# Patient Record
Sex: Female | Born: 1955
Health system: Southern US, Community
[De-identification: ages and names within clinical notes are randomized; demographics above are authoritative.]

## PROBLEM LIST (undated history)

## (undated) ENCOUNTER — Ambulatory Visit (HOSPITAL_COMMUNITY): Admission: EM | Payer: Medicare Other

## (undated) DIAGNOSIS — M1990 Unspecified osteoarthritis, unspecified site: Secondary | ICD-10-CM

## (undated) DIAGNOSIS — R569 Unspecified convulsions: Secondary | ICD-10-CM

## (undated) DIAGNOSIS — I10 Essential (primary) hypertension: Secondary | ICD-10-CM

## (undated) DIAGNOSIS — E119 Type 2 diabetes mellitus without complications: Secondary | ICD-10-CM

---

## 1997-05-25 ENCOUNTER — Emergency Department (HOSPITAL_COMMUNITY): Admission: EM | Admit: 1997-05-25 | Discharge: 1997-05-25 | Payer: Self-pay | Admitting: Emergency Medicine

## 1997-08-19 ENCOUNTER — Emergency Department (HOSPITAL_COMMUNITY): Admission: EM | Admit: 1997-08-19 | Discharge: 1997-08-19 | Payer: Self-pay | Admitting: Emergency Medicine

## 1997-08-23 ENCOUNTER — Emergency Department (HOSPITAL_COMMUNITY): Admission: EM | Admit: 1997-08-23 | Discharge: 1997-08-23 | Payer: Self-pay | Admitting: Emergency Medicine

## 1997-12-27 ENCOUNTER — Encounter: Payer: Self-pay | Admitting: Internal Medicine

## 1997-12-27 ENCOUNTER — Ambulatory Visit (HOSPITAL_COMMUNITY): Admission: RE | Admit: 1997-12-27 | Discharge: 1997-12-27 | Payer: Self-pay | Admitting: Internal Medicine

## 1998-01-30 ENCOUNTER — Encounter: Admission: RE | Admit: 1998-01-30 | Discharge: 1998-01-30 | Payer: Self-pay | Admitting: Obstetrics

## 1998-05-07 ENCOUNTER — Emergency Department (HOSPITAL_COMMUNITY): Admission: EM | Admit: 1998-05-07 | Discharge: 1998-05-07 | Payer: Self-pay | Admitting: Emergency Medicine

## 1998-09-19 ENCOUNTER — Ambulatory Visit (HOSPITAL_COMMUNITY): Admission: RE | Admit: 1998-09-19 | Discharge: 1998-09-19 | Payer: Self-pay | Admitting: *Deleted

## 1998-12-20 ENCOUNTER — Emergency Department (HOSPITAL_COMMUNITY): Admission: EM | Admit: 1998-12-20 | Discharge: 1998-12-20 | Payer: Self-pay | Admitting: Emergency Medicine

## 1999-07-03 ENCOUNTER — Ambulatory Visit (HOSPITAL_COMMUNITY): Admission: RE | Admit: 1999-07-03 | Discharge: 1999-07-03 | Payer: Self-pay | Admitting: Internal Medicine

## 1999-07-03 ENCOUNTER — Encounter: Payer: Self-pay | Admitting: Internal Medicine

## 1999-07-09 ENCOUNTER — Ambulatory Visit (HOSPITAL_COMMUNITY): Admission: RE | Admit: 1999-07-09 | Discharge: 1999-07-09 | Payer: Self-pay | Admitting: *Deleted

## 1999-07-17 ENCOUNTER — Encounter: Admission: RE | Admit: 1999-07-17 | Discharge: 1999-07-17 | Payer: Self-pay | Admitting: Internal Medicine

## 1999-07-17 ENCOUNTER — Encounter: Payer: Self-pay | Admitting: Internal Medicine

## 2000-09-27 ENCOUNTER — Encounter: Payer: Self-pay | Admitting: Emergency Medicine

## 2000-09-27 ENCOUNTER — Emergency Department (HOSPITAL_COMMUNITY): Admission: EM | Admit: 2000-09-27 | Discharge: 2000-09-27 | Payer: Self-pay | Admitting: Emergency Medicine

## 2005-02-03 ENCOUNTER — Inpatient Hospital Stay (HOSPITAL_COMMUNITY): Admission: EM | Admit: 2005-02-03 | Discharge: 2005-02-07 | Payer: Self-pay | Admitting: Emergency Medicine

## 2005-02-18 ENCOUNTER — Ambulatory Visit: Payer: Self-pay | Admitting: *Deleted

## 2005-02-18 ENCOUNTER — Ambulatory Visit: Payer: Self-pay | Admitting: Nurse Practitioner

## 2005-03-08 ENCOUNTER — Ambulatory Visit: Payer: Self-pay | Admitting: Nurse Practitioner

## 2005-09-07 ENCOUNTER — Ambulatory Visit: Payer: Self-pay | Admitting: Nurse Practitioner

## 2005-09-21 ENCOUNTER — Ambulatory Visit: Payer: Self-pay | Admitting: Nurse Practitioner

## 2005-11-22 ENCOUNTER — Emergency Department (HOSPITAL_COMMUNITY): Admission: EM | Admit: 2005-11-22 | Discharge: 2005-11-22 | Payer: Self-pay | Admitting: Emergency Medicine

## 2005-11-27 ENCOUNTER — Emergency Department (HOSPITAL_COMMUNITY): Admission: EM | Admit: 2005-11-27 | Discharge: 2005-11-27 | Payer: Self-pay | Admitting: Emergency Medicine

## 2005-12-20 ENCOUNTER — Ambulatory Visit: Payer: Self-pay | Admitting: Nurse Practitioner

## 2006-01-20 ENCOUNTER — Ambulatory Visit: Payer: Self-pay | Admitting: Nurse Practitioner

## 2006-03-01 ENCOUNTER — Emergency Department (HOSPITAL_COMMUNITY): Admission: EM | Admit: 2006-03-01 | Discharge: 2006-03-02 | Payer: Self-pay | Admitting: Emergency Medicine

## 2006-03-01 ENCOUNTER — Emergency Department (HOSPITAL_COMMUNITY): Admission: EM | Admit: 2006-03-01 | Discharge: 2006-03-01 | Payer: Self-pay | Admitting: Emergency Medicine

## 2006-03-07 ENCOUNTER — Ambulatory Visit: Payer: Self-pay | Admitting: Nurse Practitioner

## 2006-03-16 ENCOUNTER — Ambulatory Visit: Payer: Self-pay | Admitting: Nurse Practitioner

## 2006-05-02 ENCOUNTER — Ambulatory Visit (HOSPITAL_COMMUNITY): Admission: RE | Admit: 2006-05-02 | Discharge: 2006-05-02 | Payer: Self-pay | Admitting: Nurse Practitioner

## 2006-05-02 ENCOUNTER — Ambulatory Visit: Payer: Self-pay | Admitting: Nurse Practitioner

## 2006-08-30 ENCOUNTER — Emergency Department (HOSPITAL_COMMUNITY): Admission: EM | Admit: 2006-08-30 | Discharge: 2006-08-30 | Payer: Self-pay | Admitting: Emergency Medicine

## 2006-09-28 ENCOUNTER — Encounter (INDEPENDENT_AMBULATORY_CARE_PROVIDER_SITE_OTHER): Payer: Self-pay | Admitting: *Deleted

## 2006-10-19 ENCOUNTER — Emergency Department (HOSPITAL_COMMUNITY): Admission: EM | Admit: 2006-10-19 | Discharge: 2006-10-19 | Payer: Self-pay | Admitting: Emergency Medicine

## 2006-10-28 ENCOUNTER — Emergency Department (HOSPITAL_COMMUNITY): Admission: EM | Admit: 2006-10-28 | Discharge: 2006-10-28 | Payer: Self-pay | Admitting: Emergency Medicine

## 2007-03-22 ENCOUNTER — Ambulatory Visit: Payer: Self-pay | Admitting: Internal Medicine

## 2007-03-24 ENCOUNTER — Ambulatory Visit: Payer: Self-pay | Admitting: Internal Medicine

## 2007-08-08 ENCOUNTER — Ambulatory Visit: Payer: Self-pay | Admitting: Internal Medicine

## 2007-08-24 ENCOUNTER — Ambulatory Visit: Payer: Self-pay | Admitting: Internal Medicine

## 2009-10-24 ENCOUNTER — Other Ambulatory Visit: Admission: RE | Admit: 2009-10-24 | Discharge: 2009-10-24 | Payer: Self-pay | Admitting: Obstetrics and Gynecology

## 2009-11-06 ENCOUNTER — Encounter: Admission: RE | Admit: 2009-11-06 | Discharge: 2009-11-06 | Payer: Self-pay | Admitting: Obstetrics and Gynecology

## 2009-11-20 ENCOUNTER — Encounter: Admission: RE | Admit: 2009-11-20 | Discharge: 2009-11-20 | Payer: Self-pay | Admitting: Obstetrics and Gynecology

## 2010-05-29 ENCOUNTER — Ambulatory Visit
Admission: RE | Admit: 2010-05-29 | Discharge: 2010-05-29 | Disposition: A | Discharge status: Home / Self Care | Payer: No Typology Code available for payment source | Source: Ambulatory Visit | Attending: Infectious Diseases | Admitting: Infectious Diseases

## 2010-05-29 ENCOUNTER — Other Ambulatory Visit (HOSPITAL_BASED_OUTPATIENT_CLINIC_OR_DEPARTMENT_OTHER): Payer: Self-pay | Admitting: Internal Medicine

## 2010-05-29 ENCOUNTER — Other Ambulatory Visit: Payer: Self-pay | Admitting: Infectious Diseases

## 2010-05-29 DIAGNOSIS — R7611 Nonspecific reaction to tuberculin skin test without active tuberculosis: Secondary | ICD-10-CM

## 2010-05-29 NOTE — Discharge Summary (Signed)
NAMEMarland Kitchen  Christine Rocha, Christine Rocha                 ACCOUNT NO.:  [id number]   MEDICAL RECORD NO.:  [id number]          PATIENT TYPE:  INP   LOCATION:  5702                         FACILITY:  [facility]   PHYSICIAN:  Mark C. Ophelia Charter, M.D.    DATE OF BIRTH:  02/10/1955   DATE OF ADMISSION:  02/03/2005  DATE OF DISCHARGE:  02/07/2005                                 DISCHARGE SUMMARY   FINAL DIAGNOSIS:  On-the-job injury with right third finger infection, with  cellulitis and dorsal abscess.   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  Subsequently two days later she had  progressive swelling and drainage despite soaking it.  She presented with  massive cellulitis, draining purulence over the dorsum of the hand with  abscess, which was drained in the emergency room.  She has been on Dilantin  in the past, was not taking it on admission.   HOSPITAL COURSE:  The patient was admitted after drainage of the abscess.  A  stat Gram stain was performed showing gram-positive cocci.  Hemoglobin was  6.8.  She was seen by medical service, transfused.  She was placed on  contact isolation, IV vancomycin.  Her diagnosis was likely iron-deficiency  anemia secondary to heavy menses.  She continued on vancomycin and culture  results were obtained.  White count dropped from 10.3 down to 4000-5000.  Glucose was normal.  She had typical left shift.  Blood culture results were  performed, which showed no growth.  Wound culture showed abundant  methicillin-resistant staph sensitive to tetracycline and vancomycin as  expected and resistant to oxacillin.  The finger got significant  improvement.  She was having daily whirlpool soaks or pulse lavage with  dressing change with the therapist and was ready for discharge with home  dressing changes by the 28th.  The patient was given Dilantin as an  oral  load, since she had not been on it, by the medical service.  She was  discharged on doxycycline.  Office follow-up one week.  Work slip given for  no work x1 week.      Mark C. Ophelia Charter, M.D.  Electronically Signed     MCY/MEDQ  D:  03/31/2005  T:  04/01/2005  Job:  [id number]

## 2010-05-29 NOTE — Consult Note (Signed)
NAMEMarland Rocha  MADALEE, ALTMANN                 ACCOUNT NO.:  [id number]   MEDICAL RECORD NO.:  [id number]          PATIENT TYPE:  INP   LOCATION:  1823                         FACILITY:  [facility]   PHYSICIAN:  Lonia Blood, M.D.       DATE OF BIRTH:  [birth year]   DATE OF CONSULTATION:  02/03/2005  DATE OF DISCHARGE:                                   CONSULTATION   REQUESTING PHYSICIAN:  Dr. Ophelia Charter.   REASON FOR CONSULTATION:  Anemia.   HISTORY OF PRESENT ILLNESS:  Christine Rocha is a 55 year old African-American  woman with a history of chronic blood loss anemia from heavy menses, who  came in to the [facility] Emergency Room with a swollen finger.  She was  diagnosed with cellulitis and abscess, and Dr. Ophelia Charter is going to admit the  patient for treatment.  On the routine blood work, she was discovered to be  severely anemic and we were called for a consultation.  The patient recalls  being told about two years ago that she may have anemia, but she never did  something about it.  She does not carry any history of sickle cell disease  or hemolysis that she knows of.  As a child, she did not have anemia.   PAST MEDICAL HISTORY:  Seizure disorder, for which Christine Rocha has been  taking Dilantin for a long time, but she quit taking about a month ago.  Her  last seizure activity was one month ago.  Also, she was told at times that  she has hypertension, but never took any medication for it.   SOCIAL HISTORY:  Christine Rocha is separated from her husband.  She does not  have any children.  She lives alone.  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  She smokes about two packs of  cigarettes a day.  She does not drink alcohol.   FAMILY HISTORY:  Both parents are deceased.  The patient's mother died at  age 45 in a car accident and the patient's father died at age 58 with a  myocardial infarction.   REVIEW OF SYSTEMS:  Positive for heavy menses, positive for occasional chest  pain, positive for decreased appetite, vomiting and nausea, and positive for  some cough.  Other systems as per HPI.  All other systems are negative.   PHYSICAL EXAMINATION:  VITAL SIGNS:  Upon admission shows a temperature of  100, blood pressure 137/81, heart rate 122, respiratory rate 16 and  saturation 100% on room air.  GENERAL:  The patient appears in no acute distress, alert and oriented to  place, person and time, able to provide a full and complete history.  HEENT:  Her head is normocephalic, atraumatic.  Pupils equal, round,  reactive to light and accommodation.  Extraocular movements are intact.  Sclerae are anicteric.  Conjunctivae are pale.  She has got missing teeth.  Throat is clear.  NECK:  Supple without JVD and no carotid bruits.  CHEST:  Clear to auscultation bilaterally with some rhonchi very faint, but  no wheezes and no crackles.  HEART:  Regular rate and rhythm without murmurs, rubs or gallops.  ABDOMEN:  Soft, nontender and bowel sounds are present.  EXTREMITIES:  The right upper extremity third finger has edema and  tenderness and patient has decreased range of motion on that finger.  Lower  extremities have no edema and good pulses bilaterally.  NEUROLOGICAL EXAMINATION:  Cranial nerves 2 to 12 are intact.  Strength 5/5  in all four extremities.  Sensation appears to be intact.   LABORATORY VALUES:  On admission:  Sodium 136, potassium 3.4, chloride 106,  BUN 11, creatinine 1, glucose 100, white blood cell count 10,000, hemoglobin  7.6, platelet count 368, MCV 62, urinalysis positive for leukocyte esterase  and 3 to 6 white blood cells.  Portable chest x-ray shows no acute disease  and EKG some flat T-waves in 2, 3, aVF and negative T-wave in V3.  On plain  x-ray of the finger shows diffuse soft tissue edema, but no apparent  osteomyelitis.   IMPRESSION AND RECOMMENDATIONS:  1.  Cellulitis, the third finger.  We agree with intravenous Vancomycin       empirically as Dr. Ophelia Charter ordered and the patient will probably need I&D      per ortho service.  2.  Anemia, microcytic in nature most likely secondary to iron deficiency      from chronic blood loss secondary to menses.  We will need also to rule      out GI loss.  Plan is to obtain an iron panel, ferritin, heme check all      stools, start the patient on iron therapy, and type and cross unit to      hold.  3.  History of seizures.  We will resume the patient's Dilantin and      recommend that she stays compliant with it.  4.  Tobacco abuse.  The patient needs counseling for smoking cessation and      we will start a nicotine patch.  Thank you for the consultation.  We      will follow along with you.      Lonia Blood, M.D.  Electronically Signed     SL/MEDQ  D:  02/03/2005  T:  02/03/2005  Job:  [id number]

## 2010-10-22 LAB — COMPREHENSIVE METABOLIC PANEL
AST: 20
BUN: 7
CO2: 21
Chloride: 111
Creatinine, Ser: 0.88
GFR calc Af Amer: >60
GFR calc non Af Amer: >60
Total Bilirubin: 0.5

## 2010-10-22 LAB — POCT I-STAT CREATININE: Creatinine, Ser: 0.9

## 2010-10-22 LAB — RAPID URINE DRUG SCREEN, HOSP PERFORMED
Amphetamines: NOT DETECTED
Barbiturates: NOT DETECTED
Benzodiazepines: NOT DETECTED
Cocaine: NOT DETECTED
Opiates: NOT DETECTED

## 2010-10-22 LAB — I-STAT 8, (EC8 V) (CONVERTED LAB)
Acid-base deficit: 5 — ABNORMAL HIGH
Chloride: 111
pCO2, Ven: 36 — ABNORMAL LOW
pH, Ven: 7.356 — ABNORMAL HIGH

## 2011-01-21 ENCOUNTER — Emergency Department (HOSPITAL_COMMUNITY)
Admission: EM | Admit: 2011-01-21 | Discharge: 2011-01-21 | Disposition: A | Discharge status: Home / Self Care | Payer: Self-pay | Attending: Emergency Medicine | Admitting: Emergency Medicine

## 2011-01-21 ENCOUNTER — Encounter (HOSPITAL_COMMUNITY): Payer: Self-pay | Admitting: Emergency Medicine

## 2011-01-21 DIAGNOSIS — K047 Periapical abscess without sinus: Secondary | ICD-10-CM | POA: Insufficient documentation

## 2011-01-21 DIAGNOSIS — R6884 Jaw pain: Secondary | ICD-10-CM | POA: Insufficient documentation

## 2011-01-21 DIAGNOSIS — R599 Enlarged lymph nodes, unspecified: Secondary | ICD-10-CM | POA: Insufficient documentation

## 2011-01-21 DIAGNOSIS — R220 Localized swelling, mass and lump, head: Secondary | ICD-10-CM | POA: Insufficient documentation

## 2011-01-21 DIAGNOSIS — G40909 Epilepsy, unspecified, not intractable, without status epilepticus: Secondary | ICD-10-CM | POA: Insufficient documentation

## 2011-01-21 DIAGNOSIS — K089 Disorder of teeth and supporting structures, unspecified: Secondary | ICD-10-CM | POA: Insufficient documentation

## 2011-01-21 HISTORY — DX: Unspecified convulsions: R56.9

## 2011-01-21 MED ORDER — HYDROCODONE-ACETAMINOPHEN 5-325 MG PO TABS: 1.0000 | ORAL_TABLET | Freq: Four times a day (QID) | ORAL | Status: AC | PRN

## 2011-01-21 MED ORDER — NAPROXEN 500 MG PO TABS: 500.0000 mg | ORAL_TABLET | Freq: Two times a day (BID) | ORAL | Status: DC

## 2011-01-21 MED ORDER — CLINDAMYCIN HCL 150 MG PO CAPS: 150.0000 mg | ORAL_CAPSULE | Freq: Four times a day (QID) | ORAL | Status: AC

## 2011-01-21 NOTE — ED Notes (Signed)
Left lower tooth pain x 3 nights

## 2011-01-21 NOTE — ED Notes (Signed)
Discharge instructions reviewed with pt.  Verbalizes understanding.  No questions asked; No further c/o's voiced.  Ambulatory to lobby.

## 2011-01-21 NOTE — ED Provider Notes (Signed)
History     CSN: [id number]  Arrival date & time 01/21/11  1253   First MD Initiated Contact with Patient 01/21/11 1350      Chief Complaint  Patient presents with  . Dental Pain    (Consider location/radiation/quality/duration/timing/severity/associated sxs/prior treatment) Patient is a 56 y.o. female presenting with tooth pain. The history is provided by the patient.  Dental PainThe primary symptoms include mouth pain. Primary symptoms do not include dental injury, oral bleeding, headaches, fever, shortness of breath, sore throat or angioedema. The symptoms began 2 days ago. The symptoms are worsening. The symptoms occur constantly.  Additional symptoms include: jaw pain and facial swelling. Additional symptoms do not include: purulent gums and trismus. Medical issues include: smoking.   The pain is 10 out of 10 it is nonradiating associated with swelling of the left jaw. Past Medical History  Diagnosis Date  . Seizures     History reviewed. No pertinent past surgical history.  No family history on file.  History  Substance Use Topics  . Smoking status: Current Everyday Smoker  . Smokeless tobacco: Not on file  . Alcohol Use: Yes    OB History    Grav Para Term Preterm Abortions TAB SAB Ect Mult Living                  Review of Systems  Constitutional: Negative for fever.  HENT: Positive for facial swelling. Negative for congestion and sore throat.   Eyes: Negative for visual disturbance.  Respiratory: Negative for shortness of breath.   Cardiovascular: Negative for chest pain.  Gastrointestinal: Negative for abdominal pain.  Genitourinary: Negative for dysuria.  Musculoskeletal: Negative for back pain.  Neurological: Negative for headaches.  Hematological: Does not bruise/bleed easily.    Allergies  Review of patient's allergies indicates no known allergies.  Home Medications   Current Outpatient Rx  Name Route Sig Dispense Refill  . CLINDAMYCIN HCL  150 MG PO CAPS Oral Take 1 capsule (150 mg total) by mouth every 6 (six) hours. 28 capsule 0  . CLINDAMYCIN HCL 150 MG PO CAPS Oral Take 1 capsule (150 mg total) by mouth every 6 (six) hours. 28 capsule 0  . HYDROCODONE-ACETAMINOPHEN 5-325 MG PO TABS Oral Take 1-2 tablets by mouth every 6 (six) hours as needed for pain. 14 tablet 0    BP 164/94  Pulse 80  Temp 98.4 F (36.9 C)  Resp 18  SpO2 99%  Physical Exam  Nursing note and vitals reviewed. Constitutional: She is oriented to person, place, and time. She appears well-developed and well-nourished.  HENT:  Head: Normocephalic and atraumatic.  Mouth/Throat: Oropharynx is clear and moist.       Swelling to left lower jaw area tender second molar on the left side lower jaw no swelling under the tongue some submandibular adenopathy.  Eyes: Conjunctivae are normal. Pupils are equal, round, and reactive to light.  Neck: Normal range of motion. Neck supple.  Cardiovascular: Normal rate, regular rhythm and normal heart sounds.   No murmur heard. Pulmonary/Chest: Effort normal and breath sounds normal.  Abdominal: Soft. Bowel sounds are normal. There is no tenderness.  Musculoskeletal: Normal range of motion.  Lymphadenopathy:    She has cervical adenopathy.  Neurological: She is alert and oriented to person, place, and time. No cranial nerve deficit. She exhibits normal muscle tone. Coordination normal.  Skin: Skin is warm. No rash noted.    ED Course  Procedures (including critical care time)  Labs Reviewed -  No data to display No results found.   1. Tooth abscess       MDM    Findings consistent with left lower jaw second molar tooth abscess and some cheek swelling no swelling under the tongue. Patient has a dentist to followup with.        Shelda Jakes, MD 01/21/11 [phone]

## 2011-03-22 ENCOUNTER — Encounter (HOSPITAL_COMMUNITY): Payer: Self-pay | Admitting: *Deleted

## 2011-03-22 ENCOUNTER — Emergency Department (HOSPITAL_COMMUNITY)
Admission: EM | Admit: 2011-03-22 | Discharge: 2011-03-22 | Disposition: A | Discharge status: Home / Self Care | Payer: Medicaid Other | Attending: Emergency Medicine | Admitting: Emergency Medicine

## 2011-03-22 DIAGNOSIS — Z880 Allergy status to penicillin: Secondary | ICD-10-CM | POA: Insufficient documentation

## 2011-03-22 DIAGNOSIS — K047 Periapical abscess without sinus: Secondary | ICD-10-CM | POA: Insufficient documentation

## 2011-03-22 DIAGNOSIS — F17200 Nicotine dependence, unspecified, uncomplicated: Secondary | ICD-10-CM | POA: Insufficient documentation

## 2011-03-22 DIAGNOSIS — I10 Essential (primary) hypertension: Secondary | ICD-10-CM | POA: Insufficient documentation

## 2011-03-22 HISTORY — DX: Essential (primary) hypertension: I10

## 2011-03-22 MED ORDER — HYDROCODONE-ACETAMINOPHEN 5-325 MG PO TABS: 1.0000 | ORAL_TABLET | ORAL | Status: AC | PRN

## 2011-03-22 MED ORDER — CLINDAMYCIN HCL 150 MG PO CAPS: 450.0000 mg | ORAL_CAPSULE | Freq: Three times a day (TID) | ORAL | Status: AC

## 2011-03-22 NOTE — Discharge Instructions (Signed)
Dental Assistance If the dentist on-call cannot see you, please use the resources below:   Patients with Medicaid: [facility] [phone] [address], [id number] [address] [phone]  If unable to pay, or uninsured, contact [facility] [phone]) or [facility] Department [phone] in [place], [phone] in [facility]) to become qualified for the adult dental clinic  Other Low-Cost Community Dental Services: [facility]- [address] Natasha Bence [place], [place], [address]    [phone], Ext. [phone]    2nd and 4th Thursday of the month at 6:30am    10 clients each day by appointment, can sometimes see walk-in     patients if someone does not show for an appointment [facility]- [address] Ether Griffins [place], [place], [address]    [phone] [facility] [address] [place], [place], [address]    [phone]  [facility] Health Department- [phone] [facility] Health Department- [phone] [facility] [phone]         Dental Abscess A dental abscess usually starts from an infected tooth. Antibiotic medicine and pain pills can be helpful, but dental infections require the attention of a dentist. Rinse around the infected area often with salt water (a pinch of salt in 8 oz of warm water). Do not apply heat to the outside of your face. See your dentist or oral surgeon as soon as possible.  SEEK IMMEDIATE MEDICAL CARE IF:  You have increasing, severe pain that is not relieved by medicine.   You or your child has an oral temperature above 102 F (38.9 C), not controlled by medicine.   Your baby is older than 3 months with a rectal temperature of 102 F (38.9 C) or higher.   Your baby is 72 months old or younger with a rectal temperature of 100.4 F (38 C) or higher.   You develop chills, severe headache, difficulty breathing, or trouble swallowing.   You have swelling in the neck or around  the eye.  Document Released: 12/28/2004 Document Revised: 12/17/2010 Document Reviewed: 06/08/2006 [facility] Patient Information 2012 [place], [place].           Smoking Cessation This document explains the best ways for you to quit smoking and new treatments to help. It lists new medicines that can double or triple your chances of quitting and quitting for good. It also considers ways to avoid relapses and concerns you may have about quitting, including weight gain. NICOTINE: A POWERFUL ADDICTION If you have tried to quit smoking, you know how hard it can be. It is hard because nicotine is a very addictive drug. For some people, it can be as addictive as heroin or cocaine. Usually, people make 2 or 3 tries, or more, before finally being able to quit. Each time you try to quit, you can learn about what helps and what hurts. Quitting takes hard work and a lot of effort, but you can quit smoking. QUITTING SMOKING IS ONE OF THE MOST IMPORTANT THINGS YOU WILL EVER DO.  You will live longer, feel better, and live better.   The impact on your body of quitting smoking is felt almost immediately:   Within 20 minutes, blood pressure decreases. Pulse returns to its normal level.   After 8 hours, carbon monoxide levels in the blood return to normal. Oxygen level increases.   After 24 hours, chance of heart attack starts to decrease. Breath, hair, and body stop smelling like smoke.   After  48 hours, damaged nerve endings begin to recover. Sense of taste and smell improve.   After 72 hours, the body is virtually free of nicotine. Bronchial tubes relax and breathing becomes easier.   After 2 to 12 weeks, lungs can hold more air. Exercise becomes easier and circulation improves.   Quitting will reduce your risk of having a heart attack, stroke, cancer, or lung disease:   After 1 year, the risk of coronary heart disease is cut in half.   After 5 years, the risk of stroke falls to the same as a  nonsmoker.   After 10 years, the risk of lung cancer is cut in half and the risk of other cancers decreases significantly.   After 15 years, the risk of coronary heart disease drops, usually to the level of a nonsmoker.   If you are pregnant, quitting smoking will improve your chances of having a healthy baby.   The people you live with, especially your children, will be healthier.   You will have extra money to spend on things other than cigarettes.  FIVE KEYS TO QUITTING Studies have shown that these 5 steps will help you quit smoking and quit for good. You have the best chances of quitting if you use them together: 1. Get ready.  2. Get support and encouragement.  3. Learn new skills and behaviors.  4. Get medicine to reduce your nicotine addiction and use it correctly.  5. Be prepared for relapse or difficult situations. Be determined to continue trying to quit, even if you do not succeed at first.  1. GET READY  Set a quit date.   Change your environment.   Get rid of ALL cigarettes, ashtrays, matches, and lighters in your home, car, and place of work.   Do not let people smoke in your home.   Review your past attempts to quit. Think about what worked and what did not.   Once you quit, do not smoke. NOT EVEN A PUFF!  2. GET SUPPORT AND ENCOURAGEMENT Studies have shown that you have a better chance of being successful if you have help. You can get support in many ways.  Tell your family, friends, and coworkers that you are going to quit and need their support. Ask them not to smoke around you.   Talk to your caregivers (doctor, dentist, nurse, pharmacist, psychologist, and/or smoking counselor).   Get individual, group, or telephone counseling and support. The more counseling you have, the better your chances are of quitting. Programs are available at [employer] and health centers. Call your local health department for information about programs in your area.    Spiritual beliefs and practices may help some smokers quit.   Quit meters are Photographer that keep track of quit statistics, such as amount of "quit-time," cigarettes not smoked, and money saved.   Many smokers find one or more of the many self-help books available useful in helping them quit and stay off tobacco.  3. LEARN NEW SKILLS AND BEHAVIORS  Try to distract yourself from urges to smoke. Talk to someone, go for a walk, or occupy your time with a task.   When you first try to quit, change your routine. Take a different route to work. Drink tea instead of coffee. Eat breakfast in a different place.   Do something to reduce your stress. Take a hot bath, exercise, or read a book.   Plan something enjoyable to do every day. Reward  yourself for not smoking.   Explore interactive web-based programs that specialize in helping you quit.  4. GET MEDICINE AND USE IT CORRECTLY Medicines can help you stop smoking and decrease the urge to smoke. Combining medicine with the above behavioral methods and support can quadruple your chances of successfully quitting smoking. The U.S. Food and Drug Administration (FDA) has approved 7 medicines to help you quit smoking. These medicines fall into 3 categories.  Nicotine replacement therapy (delivers nicotine to your body without the negative effects and risks of smoking):   Nicotine gum: Available over-the-counter.   Nicotine lozenges: Available over-the-counter.   Nicotine inhaler: Available by prescription.   Nicotine nasal spray: Available by prescription.   Nicotine skin patches (transdermal): Available by prescription and over-the-counter.   Antidepressant medicine (helps people abstain from smoking, but how this works is unknown):   Bupropion sustained-release (SR) tablets: Available by prescription.   Nicotinic receptor partial agonist (simulates the effect of nicotine in your brain):   Varenicline  tartrate tablets: Available by prescription.   Ask your caregiver for advice about which medicines to use and how to use them. Carefully read the information on the package.   Everyone who is trying to quit may benefit from using a medicine. If you are pregnant or trying to become pregnant, nursing an infant, you are under age 28, or you smoke fewer than 10 cigarettes per day, talk to your caregiver before taking any nicotine replacement medicines.   You should stop using a nicotine replacement product and call your caregiver if you experience nausea, dizziness, weakness, vomiting, fast or irregular heartbeat, mouth problems with the lozenge or gum, or redness or swelling of the skin around the patch that does not go away.   Do not use any other product containing nicotine while using a nicotine replacement product.   Talk to your caregiver before using these products if you have diabetes, heart disease, asthma, stomach ulcers, you had a recent heart attack, you have high blood pressure that is not controlled with medicine, a history of irregular heartbeat, or you have been prescribed medicine to help you quit smoking.  5. BE PREPARED FOR RELAPSE OR DIFFICULT SITUATIONS  Most relapses occur within the first 3 months after quitting. Do not be discouraged if you start smoking again. Remember, most people try several times before they finally quit.   You may have symptoms of withdrawal because your body is used to nicotine. You may crave cigarettes, be irritable, feel very hungry, cough often, get headaches, or have difficulty concentrating.   The withdrawal symptoms are only temporary. They are strongest when you first quit, but they will go away within 10 to 14 days.  Here are some difficult situations to watch for:  Alcohol. Avoid drinking alcohol. Drinking lowers your chances of successfully quitting.   Caffeine. Try to reduce the amount of caffeine you consume. It also lowers your chances of  successfully quitting.   Other smokers. Being around smoking can make you want to smoke. Avoid smokers.   Weight gain. Many smokers will gain weight when they quit, usually less than 10 pounds. Eat a healthy diet and stay active. Do not let weight gain distract you from your main goal, quitting smoking. Some medicines that help you quit smoking may also help delay weight gain. You can always lose the weight gained after you quit.   Bad mood or depression. There are a lot of ways to improve your mood other than smoking.  If  you are having problems with any of these situations, talk to your caregiver. SPECIAL SITUATIONS AND CONDITIONS Studies suggest that everyone can quit smoking. Your situation or condition can give you a special reason to quit.  Pregnant women/new mothers: By quitting, you protect your baby's health and your own.   Hospitalized patients: By quitting, you reduce health problems and help healing.   Heart attack patients: By quitting, you reduce your risk of a second heart attack.   Lung, head, and neck cancer patients: By quitting, you reduce your chance of a second cancer.   Parents of children and adolescents: By quitting, you protect your children from illnesses caused by secondhand smoke.  QUESTIONS TO THINK ABOUT Think about the following questions before you try to stop smoking. You may want to talk about your answers with your caregiver.  Why do you want to quit?   If you tried to quit in the past, what helped and what did not?   What will be the most difficult situations for you after you quit? How will you plan to handle them?   Who can help you through the tough times? Your family? Friends? Caregiver?   What pleasures do you get from smoking? What ways can you still get pleasure if you quit?  Here are some questions to ask your caregiver:  How can you help me to be successful at quitting?   What medicine do you think would be best for me and how should I  take it?   What should I do if I need more help?   What is smoking withdrawal like? How can I get information on withdrawal?  Quitting takes hard work and a lot of effort, but you can quit smoking. FOR MORE INFORMATION  Smokefree.gov (http://www.davis-sullivan.com/) provides free, accurate, evidence-based information and professional assistance to help support the immediate and long-term needs of people trying to quit smoking. Document Released: 12/22/2000 Document Revised: 12/17/2010 Document Reviewed: 10/14/2008 [facility] Patient Information 2012 [place], [place].

## 2011-03-22 NOTE — ED Notes (Signed)
Presents c/o left lower dental and jaw pain. Presents with facial swelling and difficulty chewing. Denies sob or trouble swallowing.

## 2011-03-22 NOTE — ED Provider Notes (Signed)
History     CSN: [id number]  Arrival date & time 03/22/11  4098   First MD Initiated Contact with Patient 03/22/11 [phone]      Chief Complaint  Patient presents with  . Dental Pain  . Facial Swelling    (Consider location/radiation/quality/duration/timing/severity/associated sxs/prior treatment) Patient is a 56 y.o. female presenting with tooth pain. The history is provided by the patient.  Dental PainThe primary symptoms include mouth pain. Primary symptoms do not include headaches, fever, shortness of breath or sore throat. The symptoms began 2 days ago. The symptoms are worsening. The symptoms are recurrent. The symptoms occur constantly.  Affected locations include: gum(s) and teeth.  Additional symptoms include: gum swelling, gum tenderness, jaw pain and facial swelling. Additional symptoms do not include: purulent gums, trismus, trouble swallowing and ear pain. Medical issues include: smoking.  No fever or chills. Pt reports a hx of dental abscess, tx with abx approx 2 months ago. She has not yet followed up with a dentist as her medical card has not yet come in.  Past Medical History  Diagnosis Date  . Seizures   . Hypertension     No past surgical history on file.  No family history on file.  History  Substance Use Topics  . Smoking status: Current Everyday Smoker  . Smokeless tobacco: Not on file  . Alcohol Use: Yes     Review of Systems  Constitutional: Negative for fever and chills.  HENT: Positive for facial swelling and dental problem. Negative for ear pain, sore throat, trouble swallowing, neck pain, neck stiffness and voice change.   Eyes: Negative for pain and visual disturbance.  Respiratory: Negative for shortness of breath.   Gastrointestinal: Negative for nausea and vomiting.  Neurological: Negative for headaches.    Allergies  Penicillins  Home Medications  No current outpatient prescriptions on file.  BP 155/91  Pulse 86  Temp(Src) 98.1 F  (36.7 C) (Oral)  Resp 15  SpO2 95%  Physical Exam  Nursing note and vitals reviewed. Constitutional: She is oriented to person, place, and time. She appears well-developed and well-nourished.       NAD  HENT:  Head: Normocephalic and atraumatic. No trismus in the jaw.  Right Ear: External ear normal.  Left Ear: External ear normal.  Nose: Nose normal.  Mouth/Throat: Mucous membranes are normal. Dental abscesses present. No uvula swelling. No oropharyngeal exudate, posterior oropharyngeal edema, posterior oropharyngeal erythema or tonsillar abscesses.       Full upper, partial lower dentures in place. Caries to native teeth. Swelling and tenderness without purulence to gingiva at base of bottom left second molar with tooth pain on percussion.   Eyes: Conjunctivae are normal. Pupils are equal, round, and reactive to light.  Neck: Normal range of motion. Neck supple.       Mild left submandibular LAD without neck swelling or tenderness.  Cardiovascular: Normal rate and regular rhythm.   Pulmonary/Chest: No respiratory distress.  Musculoskeletal: She exhibits no edema.  Neurological: She is alert and oriented to person, place, and time. No cranial nerve deficit.  Skin: Skin is warm and dry.  Psychiatric: She has a normal mood and affect.    ED Course  Procedures (including critical care time)  Labs Reviewed - No data to display No results found.   Dx 1 : Dental abscess   MDM  Dental abscess. No area visible amenable to ED drainage. NO s/s of ludwig angina. Pt allergic to PCN, will place on clinda  and give rx for pain. Discussed with pt risks of not obtaining appropriate dental follow-up. She will call to find out the status of her dental card and if she will not be able to follow-up with her dentist in the next week, will obtain appropriate follow-up with resources I will provide on her d/c paperwork. Also discussed the role of smoking on dental health and will provide smoking  cessation information.        Shaaron Adler, [place] 03/22/11 [phone]

## 2011-03-22 NOTE — ED Provider Notes (Signed)
Medical screening examination/treatment/procedure(s) were performed by non-physician practitioner and as supervising physician I was immediately available for consultation/collaboration.   Dominique Ressel, MD 03/22/11 1532 

## 2012-03-26 ENCOUNTER — Encounter (HOSPITAL_COMMUNITY): Payer: Self-pay | Admitting: Family Medicine

## 2012-03-26 ENCOUNTER — Emergency Department (HOSPITAL_COMMUNITY): Payer: Worker's Compensation

## 2012-03-26 ENCOUNTER — Emergency Department (HOSPITAL_COMMUNITY)
Admission: EM | Admit: 2012-03-26 | Discharge: 2012-03-26 | Disposition: A | Discharge status: Home / Self Care | Payer: Worker's Compensation | Attending: Emergency Medicine | Admitting: Emergency Medicine

## 2012-03-26 DIAGNOSIS — Y9229 Other specified public building as the place of occurrence of the external cause: Secondary | ICD-10-CM | POA: Insufficient documentation

## 2012-03-26 DIAGNOSIS — Y990 Civilian activity done for income or pay: Secondary | ICD-10-CM | POA: Insufficient documentation

## 2012-03-26 DIAGNOSIS — F17200 Nicotine dependence, unspecified, uncomplicated: Secondary | ICD-10-CM | POA: Insufficient documentation

## 2012-03-26 DIAGNOSIS — Y9389 Activity, other specified: Secondary | ICD-10-CM | POA: Insufficient documentation

## 2012-03-26 DIAGNOSIS — S6000XA Contusion of unspecified finger without damage to nail, initial encounter: Secondary | ICD-10-CM

## 2012-03-26 DIAGNOSIS — I10 Essential (primary) hypertension: Secondary | ICD-10-CM | POA: Insufficient documentation

## 2012-03-26 DIAGNOSIS — W230XXA Caught, crushed, jammed, or pinched between moving objects, initial encounter: Secondary | ICD-10-CM | POA: Insufficient documentation

## 2012-03-26 DIAGNOSIS — G40909 Epilepsy, unspecified, not intractable, without status epilepticus: Secondary | ICD-10-CM | POA: Insufficient documentation

## 2012-03-26 MED ORDER — TRAMADOL HCL 50 MG PO TABS: 50.0000 mg | ORAL_TABLET | Freq: Four times a day (QID) | ORAL | Status: DC | PRN

## 2012-03-26 NOTE — ED Notes (Signed)
Buddy taped middle and ring finger on patient's rt. hand.

## 2012-03-26 NOTE — ED Notes (Signed)
Pt is in xray

## 2012-03-26 NOTE — ED Provider Notes (Signed)
Medical screening examination/treatment/procedure(s) were performed by non-physician practitioner and as supervising physician I was immediately available for consultation/collaboration.  Penney Domanski L Amelda Hapke, MD 03/26/12 1659 

## 2012-03-26 NOTE — ED Provider Notes (Signed)
History     CSN: [id number]  Arrival date & time 03/26/12  1000   First MD Initiated Contact with Patient 03/26/12 1129      Chief Complaint  Patient presents with  . Hand Pain    (Consider location/radiation/quality/duration/timing/severity/associated sxs/prior treatment) HPI  Patient presents to the emergency department with complaints of middle finger pain on her right hand. She works at a [employer] and axillae closed her finger into one of the sliding doors. She says now it is swollen and hurts to move. She denies any other injury. The injury happened on Friday. She has delayed presentation because she just found out from her work that it would be a workers comp case. She denies being under the influence of any alcohol drugs. nad vss  Past Medical History  Diagnosis Date  . Seizures   . Hypertension     History reviewed. No pertinent past surgical history.  History reviewed. No pertinent family history.  History  Substance Use Topics  . Smoking status: Current Every Day Smoker  . Smokeless tobacco: Not on file  . Alcohol Use: Yes    OB History   Grav Para Term Preterm Abortions TAB SAB Ect Mult Living                  Review of Systems  All other systems reviewed and are negative.    Allergies  Penicillins  Home Medications   Current Outpatient Rx  Name  Route  Sig  Dispense  Refill  . traMADol (ULTRAM) 50 MG tablet   Oral   Take 1 tablet (50 mg total) by mouth every 6 (six) hours as needed for pain.   15 tablet   0     BP 160/88  Pulse 73  Temp(Src) 97.3 F (36.3 C)  Resp 18  SpO2 97%  Physical Exam  Nursing note and vitals reviewed. Constitutional: She appears well-developed and well-nourished. No distress.  HENT:  Head: Normocephalic and atraumatic.  Eyes: Pupils are equal, round, and reactive to light.  Neck: Normal range of motion. Neck supple.  Cardiovascular: Normal rate and regular rhythm.   Pulmonary/Chest: Effort  normal.  Abdominal: Soft.  Musculoskeletal:       Hands: Patient is able to flex and extend all joints to right hand including right middle finger. She has decreased range of motion because of pain. Her finger is swollen with signs of ecchymosis. No drainable hematoma under the nail. No crepitus or deformity noted. She has full sedation and capillary refill is less than 2 seconds.  Neurological: She is alert.  Skin: Skin is warm and dry.    ED Course  Procedures (including critical care time)  Labs Reviewed - No data to display Dg Hand Complete Right  03/26/2012  *RADIOLOGY REPORT*  Clinical Data: Hand slammed in door.  Pain and swelling.  RIGHT HAND - COMPLETE 3+ VIEW  Comparison: None.  Findings: No evidence of fracture or dislocation.  Early degenerative spurring is seen at the distal interphalangeal joint of index finger, however there is no evidence of joint space narrowing.  The no other significant bone abnormality identified.  IMPRESSION:  1.  No acute findings. 2.  Early degenerative spurring of the index finger DIP joint.   Original Report Authenticated By: Myles Rosenthal, M.D.      1. Finger contusion, initial encounter       MDM  X-rays are negative for any abnormality from the injury.  Fingers buddy tapped together  in ER for comfort. Tramadol Rx given and referral to hand.  Pt has been advised of the symptoms that warrant their return to the ED. Patient has voiced understanding and has agreed to follow-up with the PCP or specialist.         Dorthula Matas, PA-C 03/26/12 1211

## 2012-03-26 NOTE — ED Notes (Signed)
Per pt sts closed her hand in the door on Friday and now swelling and cant move it.

## 2012-08-31 ENCOUNTER — Emergency Department (HOSPITAL_COMMUNITY)
Admission: EM | Admit: 2012-08-31 | Discharge: 2012-08-31 | Disposition: A | Discharge status: Home / Self Care | Payer: Medicaid Other | Attending: Emergency Medicine | Admitting: Emergency Medicine

## 2012-08-31 ENCOUNTER — Encounter (HOSPITAL_COMMUNITY): Payer: Self-pay | Admitting: *Deleted

## 2012-08-31 DIAGNOSIS — I10 Essential (primary) hypertension: Secondary | ICD-10-CM | POA: Insufficient documentation

## 2012-08-31 DIAGNOSIS — R220 Localized swelling, mass and lump, head: Secondary | ICD-10-CM | POA: Insufficient documentation

## 2012-08-31 DIAGNOSIS — R1310 Dysphagia, unspecified: Secondary | ICD-10-CM | POA: Insufficient documentation

## 2012-08-31 DIAGNOSIS — Y929 Unspecified place or not applicable: Secondary | ICD-10-CM | POA: Insufficient documentation

## 2012-08-31 DIAGNOSIS — F17200 Nicotine dependence, unspecified, uncomplicated: Secondary | ICD-10-CM | POA: Insufficient documentation

## 2012-08-31 DIAGNOSIS — IMO0002 Reserved for concepts with insufficient information to code with codable children: Secondary | ICD-10-CM | POA: Insufficient documentation

## 2012-08-31 DIAGNOSIS — M25562 Pain in left knee: Secondary | ICD-10-CM

## 2012-08-31 DIAGNOSIS — Y9389 Activity, other specified: Secondary | ICD-10-CM | POA: Insufficient documentation

## 2012-08-31 DIAGNOSIS — S8990XA Unspecified injury of unspecified lower leg, initial encounter: Secondary | ICD-10-CM | POA: Insufficient documentation

## 2012-08-31 DIAGNOSIS — Y990 Civilian activity done for income or pay: Secondary | ICD-10-CM | POA: Insufficient documentation

## 2012-08-31 NOTE — ED Notes (Signed)
Pt reports bumping her left lower leg on something at work yesterday, reports pain and swelling, no redness noted to leg, pt ambulatory at triage.reports also having a lump or mass to right side of neck for several days, airway intact.

## 2012-08-31 NOTE — ED Provider Notes (Signed)
CSN: [id number]     Arrival date & time 08/31/12  1724 History  This chart was scribed for non-physician practitioner Sharilyn Sites working with Raeford Razor, MD by Danella Maiers, ED Scribe. This patient was seen in room TR07C/TR07C and the patient's care was started at 6:26 PM.    Chief Complaint  Patient presents with  . Leg Pain    The history is provided by the patient. No language interpreter was used.   HPI Comments: Christine Rocha is a 57 y.o. female who presents to the Emergency Department complaining of constant, sudden-onset left knee pain with associated knee swelling after bumping her leg on an iron pole at work yesterday. Pain worse with prolonged standing and ambulation-- which pt does the entire length of her work shift.  Denies any numbness or paresthesias of LE.  No prior left knee injury.  Has taken OTC aleve with some improvement of sx.  She also complains of a  "lump" on the right side of her neck that appeared several days ago.  States now she feels it in the middle of her neck.  No hx of similar.  No recent illness.  No fevers, sweats, or chills.  No hx of thyroid problems.  No difficulty swallowing or talking.  Past Medical History  Diagnosis Date  . Seizures   . Hypertension    History reviewed. No pertinent past surgical history. History reviewed. No pertinent family history. History  Substance Use Topics  . Smoking status: Current Every Day Smoker  . Smokeless tobacco: Not on file  . Alcohol Use: Yes   OB History   Grav Para Term Preterm Abortions TAB SAB Ect Mult Living                 Review of Systems  HENT: Positive for trouble swallowing.   Musculoskeletal: Positive for arthralgias. Myalgias: left knee pain.  Skin:       "lump" on right side of neck  All other systems reviewed and are negative.    Allergies  Penicillins  Home Medications   Current Outpatient Rx  Name  Route  Sig  Dispense  Refill  . ibuprofen (ADVIL,MOTRIN) 200 MG  tablet   Oral   Take 200 mg by mouth every 6 (six) hours as needed for pain.          BP 172/98  Pulse 88  Temp(Src) 97.9 F (36.6 C) (Oral)  Resp 18  SpO2 94%  Physical Exam  Nursing note and vitals reviewed. Constitutional: She is oriented to person, place, and time. She appears well-developed and well-nourished. No distress.  HENT:  Head: Normocephalic and atraumatic.  Mouth/Throat: Oropharynx is clear and moist.  Eyes: Conjunctivae and EOM are normal. Pupils are equal, round, and reactive to light.  Neck: Trachea normal, normal range of motion and full passive range of motion without pain. Neck supple. No rigidity.  No masses on right side of neck, thyroid slightly enlarged; handling secretions appropriately, speaking in full complete sentences without difficulty  Cardiovascular: Normal rate, regular rhythm and normal heart sounds.   Pulmonary/Chest: Effort normal and breath sounds normal.  Musculoskeletal: Normal range of motion. She exhibits edema (1+ pitting edema bilaterally).       Left knee: She exhibits normal range of motion, no swelling, no effusion, no ecchymosis, no deformity and no erythema. Tenderness found. Medial joint line tenderness noted.  Left knee with tenderness of medial joint line. No deformity or swelling. Full ROM maintained. Distal sensation  intact. Normal gait  Lymphadenopathy:    She has no cervical adenopathy.  Neurological: She is alert and oriented to person, place, and time.  Skin: Skin is warm and dry. She is not diaphoretic.  Psychiatric: She has a normal mood and affect.    ED Course  Medications - No data to display  DIAGNOSTIC STUDIES: Oxygen Saturation is 94% on room air, adequate by my interpretation.    COORDINATION OF CARE: 7:05 PM- Discussed treatment plan with pt which includes a knee brace and pt agrees to plan. Advised pt to follow up with the outpatient clinic for further eval of thyroid Procedures (including critical care  time)  Labs Reviewed - No data to display No results found.  1. Knee pain, left     MDM   Doubt acute fx of tib/fib-- imaging deferred.  Knee sleeve placed for comfort.  Instructed to continue taking over-the-counter anti-inflammatories to help with pain and swelling. No masses felt on right side of neck, but thyroid feels somewhat enlarged.  Encouraged close FU with [facility] to have thyroid evaluated further.  Discussed plan with pt, they agreed.  Return precautions advised.  I personally performed the services described in this documentation, which was scribed in my presence. The recorded information has been reviewed and is accurate.    Garlon Hatchet, PA-C 08/31/12 2327

## 2012-08-31 NOTE — ED Notes (Signed)
Pt given ice to apply to knee.

## 2012-09-01 NOTE — ED Provider Notes (Signed)
Medical screening examination/treatment/procedure(s) were performed by non-physician practitioner and as supervising physician I was immediately available for consultation/collaboration.  Naitik Hermann, MD 09/01/12 1535 

## 2012-09-04 ENCOUNTER — Ambulatory Visit: Payer: PRIVATE HEALTH INSURANCE | Attending: Family Medicine | Admitting: Internal Medicine

## 2012-09-04 VITALS — BP 180/118 | HR 91 | Temp 97.0°F | Resp 16 | Wt 189.0 lb

## 2012-09-04 DIAGNOSIS — M25569 Pain in unspecified knee: Secondary | ICD-10-CM | POA: Insufficient documentation

## 2012-09-04 DIAGNOSIS — I10 Essential (primary) hypertension: Secondary | ICD-10-CM

## 2012-09-04 DIAGNOSIS — R220 Localized swelling, mass and lump, head: Secondary | ICD-10-CM

## 2012-09-04 MED ORDER — NICOTINE 14 MG/24HR TD PT24: 1.0000 | MEDICATED_PATCH | TRANSDERMAL | Status: DC

## 2012-09-04 MED ORDER — ATENOLOL 50 MG PO TABS: 50.0000 mg | ORAL_TABLET | Freq: Every day | ORAL | Status: DC

## 2012-09-04 NOTE — Progress Notes (Signed)
Patient ID: Christine Rocha, female   DOB: 11-Oct-1955, 57 y.o.   MRN: [id number]  CC:  HPI: 57 year old female who is here to establish care. The patient was in the ED on 8/21 for left knee pain. She has been taking ibuprofen 2 or 3 times a day.[name] No x-rays were done. She also complained of lump on the right side of her neck which has decreased in size over the last couple of days. She had some associated difficulty with swallowing. She denies any sore throat She denies any fever, chills, cough She smokes one pack a day She is hypertensive today her systolic blood pressure close to 180. Patient states that she was on antihypertensive medication but quit taking this 3 years ago     Allergies  Allergen Reactions  . Penicillins    Past Medical History  Diagnosis Date  . Seizures   . Hypertension    Current Outpatient Prescriptions on File Prior to Visit  Medication Sig Dispense Refill  . ibuprofen (ADVIL,MOTRIN) 200 MG tablet Take 200 mg by mouth every 6 (six) hours as needed for pain.       No current facility-administered medications on file prior to visit.   History reviewed. No pertinent family history. History   Social History  . Marital Status: Single    Spouse Name: N/A    Number of Children: N/A  . Years of Education: N/A   Occupational History  . Not on file.   Social History Main Topics  . Smoking status: Current Every Day Smoker  . Smokeless tobacco: Not on file  . Alcohol Use: Yes  . Drug Use:   . Sexual Activity:    Other Topics Concern  . Not on file   Social History Narrative  . No narrative on file    Review of Systems  Constitutional: Negative for fever, chills, diaphoresis, activity change, appetite change and fatigue.  HENT: Negative for ear pain, nosebleeds, congestion, facial swelling, rhinorrhea, neck pain, neck stiffness and ear discharge.   Eyes: Negative for pain, discharge, redness, itching and visual disturbance.  Respiratory: Negative for  cough, choking, chest tightness, shortness of breath, wheezing and stridor.   Cardiovascular: Negative for chest pain, palpitations and leg swelling.  Gastrointestinal: Negative for abdominal distention.  Genitourinary: Negative for dysuria, urgency, frequency, hematuria, flank pain, decreased urine volume, difficulty urinating and dyspareunia.  Musculoskeletal: Negative for back pain, joint swelling, arthralgias and gait problem.  Neurological: Negative for dizziness, tremors, seizures, syncope, facial asymmetry, speech difficulty, weakness, light-headedness, numbness and headaches.  Hematological: Negative for adenopathy. Does not bruise/bleed easily.  Psychiatric/Behavioral: Negative for hallucinations, behavioral problems, confusion, dysphoric mood, decreased concentration and agitation.    Objective:   Filed Vitals:   09/04/12 1557  BP: 180/118  Pulse: 91  Temp: 97 F (36.1 C)  Resp: 16    Physical Exam  Constitutional: Appears well-developed and well-nourished. No distress.  HENT: Normocephalic. External right and left ear normal. Oropharynx is clear and moist.  Eyes: Conjunctivae and EOM are normal. PERRLA, no scleral icterus.  Neck: Normal ROM. Neck supple. No JVD. No tracheal deviation. No thyromegaly.  CVS: RRR, S1/S2 +, no murmurs, no gallops, no carotid bruit.  Pulmonary: Effort and breath sounds normal, no stridor, rhonchi, wheezes, rales.  Abdominal: Soft. BS +,  no distension, tenderness, rebound or guarding.  Musculoskeletal: Normal range of motion. No edema and no tenderness.  Lymphadenopathy: No lymphadenopathy noted, cervical, inguinal. Neuro: Alert. Normal reflexes, muscle tone coordination. No cranial  nerve deficit. Skin: Skin is warm and dry. No rash noted. Not diaphoretic. No erythema. No pallor.  Psychiatric: Normal mood and affect. Behavior, judgment, thought content normal.   Lab Results  Component Value Date   HGB 10.2* 10/19/2006   HCT 30.0* 10/19/2006    Lab Results  Component Value Date   CREATININE 0.9 10/19/2006   BUN 7 10/19/2006   NA 144 10/19/2006   K 3.8 10/19/2006   CL 111 10/19/2006   CO2 21 10/19/2006    No results found for this basename: HGBA1C   Lipid Panel  No results found for this basename: chol, trig, hdl, cholhdl, vldl, ldlcalc       Assessment and plan:   There are no active problems to display for this patient.  Establish care Obtain baseline labs including TSH, CBC, CMP, vitamin D., lipid panel Mammogram, last mammogram was in 2011 that showed fibrocystic breast disease   Swelling head/neck Obtain ultrasound of the neck to rule out any thyroid mass or swelling  Left knee pain patient advised to alternate ibuprofen with Tylenol, because of her high blood pressure    Hypertension We'll start the patient on atenolol, 50 mg a day  Nicotine dependence patient motivated to quit smoking, have prescribed her a nicotine patch  Followup in one week      The patient was given clear instructions to go to ER or return to medical center if symptoms don't improve, worsen or new problems develop. The patient verbalized understanding. The patient was told to call to get any lab results if not heard anything in the next week.

## 2012-09-04 NOTE — Progress Notes (Signed)
Patient complains of swelling in neck States it hurts for her to swallow

## 2012-09-05 ENCOUNTER — Ambulatory Visit: Payer: Self-pay

## 2012-09-05 LAB — COMPREHENSIVE METABOLIC PANEL
ALT: 15 U/L (ref 0–35)
AST: 15 U/L (ref 0–37)
Albumin: 4.2 g/dL (ref 3.5–5.2)
Alkaline Phosphatase: 60 U/L (ref 39–117)
Calcium: 9.6 mg/dL (ref 8.4–10.5)
Chloride: 104 mEq/L (ref 96–112)
Potassium: 3.3 mEq/L — ABNORMAL LOW (ref 3.5–5.3)
Sodium: 144 mEq/L (ref 135–145)
Total Protein: 6.9 g/dL (ref 6.0–8.3)

## 2012-09-05 LAB — LIPID PANEL
Cholesterol: 169 mg/dL (ref 0–200)
LDL Cholesterol: 65 mg/dL (ref 0–99)
Triglycerides: 391 mg/dL — ABNORMAL HIGH (ref <150)
VLDL: 78 mg/dL — ABNORMAL HIGH (ref 0–40)

## 2012-09-05 LAB — CBC WITH DIFFERENTIAL/PLATELET
Basophils Absolute: 0 10*3/uL (ref 0.0–0.1)
Basophils Relative: 0 % (ref 0–1)
Eosinophils Absolute: 0.2 10*3/uL (ref 0.0–0.7)
Hemoglobin: 13.1 g/dL (ref 12.0–15.0)
MCH: 29.6 pg (ref 26.0–34.0)
MCHC: 33.9 g/dL (ref 30.0–36.0)
Monocytes Relative: 6 % (ref 3–12)
Neutro Abs: 4.1 10*3/uL (ref 1.7–7.7)
Neutrophils Relative %: 62 % (ref 43–77)
RDW: 13.9 % (ref 11.5–15.5)

## 2012-09-05 LAB — TSH: TSH: 1.447 u[IU]/mL (ref 0.350–4.500)

## 2012-09-13 ENCOUNTER — Ambulatory Visit: Payer: No Typology Code available for payment source | Attending: Internal Medicine | Admitting: Internal Medicine

## 2012-09-13 ENCOUNTER — Encounter: Payer: Self-pay | Admitting: Internal Medicine

## 2012-09-13 VITALS — BP 175/96 | HR 84 | Temp 98.7°F | Resp 18 | Ht 62.0 in | Wt 193.0 lb

## 2012-09-13 DIAGNOSIS — I10 Essential (primary) hypertension: Secondary | ICD-10-CM

## 2012-09-13 DIAGNOSIS — Z23 Encounter for immunization: Secondary | ICD-10-CM

## 2012-09-13 DIAGNOSIS — R7309 Other abnormal glucose: Secondary | ICD-10-CM | POA: Insufficient documentation

## 2012-09-13 LAB — POCT GLYCOSYLATED HEMOGLOBIN (HGB A1C): Hemoglobin A1C: 6.3

## 2012-09-13 MED ORDER — FENOFIBRATE 145 MG PO TABS: 145.0000 mg | ORAL_TABLET | Freq: Every day | ORAL | Status: DC

## 2012-09-13 MED ORDER — POTASSIUM CHLORIDE CRYS ER 20 MEQ PO TBCR: 40.0000 meq | EXTENDED_RELEASE_TABLET | Freq: Once | ORAL | Status: DC

## 2012-09-13 MED ORDER — AMLODIPINE BESYLATE 10 MG PO TABS: 10.0000 mg | ORAL_TABLET | Freq: Every day | ORAL | Status: DC

## 2012-09-13 MED ORDER — POTASSIUM CHLORIDE CRYS ER 20 MEQ PO TBCR: 20.0000 meq | EXTENDED_RELEASE_TABLET | Freq: Once | ORAL | Status: DC

## 2012-09-13 NOTE — Progress Notes (Signed)
Pt here for blood pressure f/u with new medication prescribed. BP 175/96 84 Nicotine patch not working for sensation

## 2012-09-13 NOTE — Progress Notes (Signed)
Patient ID: Christine Rocha, female   DOB: 1955-05-17, 57 y.o.   MRN: [id number] Patient Demographics  Christine Rocha, is a 57 y.o. female  NFA:[id number]  ION:[id number]  DOB - 1955/05/01  Chief Complaint  Patient presents with  . Follow-up    blood pressure with new medication        Subjective:   Christine Rocha with History of hypertension her for followup visit, she also continues to smoke, have no other subjective complaints.  Denies any subjective complaints except as above, no active headache, no chest abdominal pain at this time, not short of breath. No focal weakness which is new.   Objective:    There are no active problems to display for this patient.    Filed Vitals:   09/13/12 1746  BP: 175/96  Pulse: 84  Temp: 98.7 F (37.1 C)  TempSrc: Oral  Resp: 18  Height: 5\' 2"  (1.575 m)  Weight: 193 lb (87.544 kg)  SpO2: 98%     Exam   Awake Alert, Oriented X 3, No new F.N deficits, Normal affect [name].AT,PERRAL Supple Neck,No JVD, No cervical lymphadenopathy appriciated.  Symmetrical Chest wall movement, Good air movement bilaterally, CTAB RRR,No Gallops,Rubs or new Murmurs, No Parasternal Heave +ve B.Sounds, Abd Soft, Non tender, No organomegaly appriciated, No rebound - guarding or rigidity. No Cyanosis, Clubbing or edema, No new Rash or bruise       Data Review   CBC No results found for this basename: WBC, HGB, HCT, PLT, MCV, MCH, MCHC, RDW, NEUTRABS, LYMPHSABS, MONOABS, EOSABS, BASOSABS, BANDABS, BANDSABD,  in the last 168 hours  Chemistries   No results found for this basename: NA, K, CL, CO2, GLUCOSE, BUN, CREATININE, GFRCGP, CALCIUM, MG, AST, ALT, ALKPHOS, BILITOT,  in the last 168 hours ------------------------------------------------------------------------------------------------------------------ No results found for this basename: HGBA1C,  in the last 72  hours ------------------------------------------------------------------------------------------------------------------ No results found for this basename: CHOL, HDL, LDLCALC, TRIG, CHOLHDL, LDLDIRECT,  in the last 72 hours ------------------------------------------------------------------------------------------------------------------ No results found for this basename: TSH, T4TOTAL, FREET3, T3FREE, THYROIDAB,  in the last 72 hours ------------------------------------------------------------------------------------------------------------------ No results found for this basename: VITAMINB12, FOLATE, FERRITIN, TIBC, IRON, RETICCTPCT,  in the last 72 hours  Coagulation profile  No results found for this basename: INR, PROTIME,  in the last 168 hours     Prior to Admission medications   Medication Sig Start Date End Date Taking? Authorizing Provider  atenolol (TENORMIN) 50 MG tablet Take 1 tablet (50 mg total) by mouth daily. 09/04/12  Yes Richarda Overlie, MD  ibuprofen (ADVIL,MOTRIN) 200 MG tablet Take 200 mg by mouth every 6 (six) hours as needed for pain.   Yes Historical Provider, MD  nicotine (NICODERM CQ - DOSED IN MG/24 HOURS) 14 mg/24hr patch Place 1 patch onto the skin daily. 09/04/12  Yes Richarda Overlie, MD  amLODipine (NORVASC) 10 MG tablet Take 1 tablet (10 mg total) by mouth daily. 09/13/12   Leroy Sea, MD     Assessment & Plan    1. Hypertension poor controlled Norvasc added to her regimen we'll see her back in 2 weeks.   2. History of smoking counseled to quit smoking, continues in the NicoDerm patch.   3.High TGs - tricor, repeat lipids in 6 weeks.    Baseline A1c has been ordered,  On routine blood work last time potassium was low it will be replaced with 40 meq.  Colonoscopy - referral made    Mammogram, Pap smear - referral made  Immunizations flu shot       Leroy Sea M.D on 09/13/2012 at 6:17 PM

## 2012-09-18 ENCOUNTER — Encounter: Payer: Self-pay | Admitting: Obstetrics and Gynecology

## 2012-09-18 NOTE — Progress Notes (Signed)
Quick Note:  She has prediabetes, please have the patient come back in the clinic for a week to discuss results ______

## 2012-09-19 ENCOUNTER — Telehealth: Payer: Self-pay | Admitting: Emergency Medicine

## 2012-09-19 NOTE — Telephone Encounter (Signed)
Pt has f/u appt already scheduled 10/04/12. Will discuss lab results during visit

## 2012-09-28 ENCOUNTER — Ambulatory Visit: Payer: Self-pay | Admitting: Internal Medicine

## 2012-10-04 ENCOUNTER — Ambulatory Visit: Payer: Self-pay | Admitting: Internal Medicine

## 2012-10-11 ENCOUNTER — Ambulatory Visit: Payer: Medicaid Other | Attending: Internal Medicine | Admitting: Family Medicine

## 2012-10-11 ENCOUNTER — Encounter: Payer: Self-pay | Admitting: Family Medicine

## 2012-10-11 DIAGNOSIS — I10 Essential (primary) hypertension: Secondary | ICD-10-CM | POA: Insufficient documentation

## 2012-10-11 DIAGNOSIS — F17200 Nicotine dependence, unspecified, uncomplicated: Secondary | ICD-10-CM | POA: Insufficient documentation

## 2012-10-11 MED ORDER — POTASSIUM CHLORIDE ER 10 MEQ PO TBCR: 10.0000 meq | EXTENDED_RELEASE_TABLET | Freq: Every day | ORAL | Status: DC

## 2012-10-11 MED ORDER — ATENOLOL-CHLORTHALIDONE 100-25 MG PO TABS: 1.0000 | ORAL_TABLET | Freq: Every day | ORAL | Status: DC

## 2012-10-11 MED ORDER — BUPROPION HCL ER (SMOKING DET) 150 MG PO TB12: 150.0000 mg | ORAL_TABLET | Freq: Two times a day (BID) | ORAL | Status: DC

## 2012-10-11 NOTE — Progress Notes (Signed)
Pt is here requesting different smoking cessation medication. States the nicotine patch is not working.  Started smoking again

## 2012-10-11 NOTE — Patient Instructions (Signed)
Smoking Cessation Quitting smoking is important to your health and has many advantages. However, it is not always easy to quit since nicotine is a very addictive drug. Often times, people try 3 times or more before being able to quit. This document explains the best ways for you to prepare to quit smoking. Quitting takes hard work and a lot of effort, but you can do it. ADVANTAGES OF QUITTING SMOKING  You will live longer, feel better, and live better.  Your body will feel the impact of quitting smoking almost immediately.  Within 20 minutes, blood pressure decreases. Your pulse returns to its normal level.  After 8 hours, carbon monoxide levels in the blood return to normal. Your oxygen level increases.  After 24 hours, the chance of having a heart attack starts to decrease. Your breath, hair, and body stop smelling like smoke.  After 48 hours, damaged nerve endings begin to recover. Your sense of taste and smell improve.  After 72 hours, the body is virtually free of nicotine. Your bronchial tubes relax and breathing becomes easier.  After 2 to 12 weeks, lungs can hold more air. Exercise becomes easier and circulation improves.  The risk of having a heart attack, stroke, cancer, or lung disease is greatly reduced.  After 1 year, the risk of coronary heart disease is cut in half.  After 5 years, the risk of stroke falls to the same as a nonsmoker.  After 10 years, the risk of lung cancer is cut in half and the risk of other cancers decreases significantly.  After 15 years, the risk of coronary heart disease drops, usually to the level of a nonsmoker.  If you are pregnant, quitting smoking will improve your chances of having a healthy baby.  The people you live with, especially any children, will be healthier.  You will have extra money to spend on things other than cigarettes. QUESTIONS TO THINK ABOUT BEFORE ATTEMPTING TO QUIT You may want to talk about your answers with your  caregiver.  Why do you want to quit?  If you tried to quit in the past, what helped and what did not?  What will be the most difficult situations for you after you quit? How will you plan to handle them?  Who can help you through the tough times? Your family? Friends? A caregiver?  What pleasures do you get from smoking? What ways can you still get pleasure if you quit? Here are some questions to ask your caregiver:  How can you help me to be successful at quitting?  What medicine do you think would be best for me and how should I take it?  What should I do if I need more help?  What is smoking withdrawal like? How can I get information on withdrawal? GET READY  Set a quit date.  Change your environment by getting rid of all cigarettes, ashtrays, matches, and lighters in your home, car, or work. Do not let people smoke in your home.  Review your past attempts to quit. Think about what worked and what did not. GET SUPPORT AND ENCOURAGEMENT You have a better chance of being successful if you have help. You can get support in many ways.  Tell your family, friends, and co-workers that you are going to quit and need their support. Ask them not to smoke around you.  Get individual, group, or telephone counseling and support. Programs are available at local hospitals and health centers. Call your local health department for   information about programs in your area.  Spiritual beliefs and practices may help some smokers quit.  Download a "quit meter" on your computer to keep track of quit statistics, such as how long you have gone without smoking, cigarettes not smoked, and money saved.  Get a self-help book about quitting smoking and staying off of tobacco. LEARN NEW SKILLS AND BEHAVIORS  Distract yourself from urges to smoke. Talk to someone, go for a walk, or occupy your time with a task.  Change your normal routine. Take a different route to work. Drink tea instead of coffee.  Eat breakfast in a different place.  Reduce your stress. Take a hot bath, exercise, or read a book.  Plan something enjoyable to do every day. Reward yourself for not smoking.  Explore interactive web-based programs that specialize in helping you quit. GET MEDICINE AND USE IT CORRECTLY Medicines can help you stop smoking and decrease the urge to smoke. Combining medicine with the above behavioral methods and support can greatly increase your chances of successfully quitting smoking.  Nicotine replacement therapy helps deliver nicotine to your body without the negative effects and risks of smoking. Nicotine replacement therapy includes nicotine gum, lozenges, inhalers, nasal sprays, and skin patches. Some may be available over-the-counter and others require a prescription.  Antidepressant medicine helps people abstain from smoking, but how this works is unknown. This medicine is available by prescription.  Nicotinic receptor partial agonist medicine simulates the effect of nicotine in your brain. This medicine is available by prescription. Ask your caregiver for advice about which medicines to use and how to use them based on your health history. Your caregiver will tell you what side effects to look out for if you choose to be on a medicine or therapy. Carefully read the information on the package. Do not use any other product containing nicotine while using a nicotine replacement product.  RELAPSE OR DIFFICULT SITUATIONS Most relapses occur within the first 3 months after quitting. Do not be discouraged if you start smoking again. Remember, most people try several times before finally quitting. You may have symptoms of withdrawal because your body is used to nicotine. You may crave cigarettes, be irritable, feel very hungry, cough often, get headaches, or have difficulty concentrating. The withdrawal symptoms are only temporary. They are strongest when you first quit, but they will go away within  10 14 days. To reduce the chances of relapse, try to:  Avoid drinking alcohol. Drinking lowers your chances of successfully quitting.  Reduce the amount of caffeine you consume. Once you quit smoking, the amount of caffeine in your body increases and can give you symptoms, such as a rapid heartbeat, sweating, and anxiety.  Avoid smokers because they can make you want to smoke.  Do not let weight gain distract you. Many smokers will gain weight when they quit, usually less than 10 pounds. Eat a healthy diet and stay active. You can always lose the weight gained after you quit.  Find ways to improve your mood other than smoking. FOR MORE INFORMATION  www.smokefree.gov  Document Released: 12/22/2000 Document Revised: 06/29/2011 Document Reviewed: 04/08/2011 [facility] Patient Information 2014 [place], [place]. Smoking Cessation, Tips for Success YOU CAN QUIT SMOKING If you are ready to quit smoking, congratulations! You have chosen to help yourself be healthier. Cigarettes bring nicotine, tar, carbon monoxide, and other irritants into your body. Your lungs, heart, and blood vessels will be able to work better without these poisons. There are many different ways to  quit smoking. Nicotine gum, nicotine patches, a nicotine inhaler, or nicotine nasal spray can help with physical craving. Hypnosis, support groups, and medicines help break the habit of smoking. Here are some tips to help you quit for good.  Throw away all cigarettes.  Clean and remove all ashtrays from your home, work, and car.  On a card, write down your reasons for quitting. Carry the card with you and read it when you get the urge to smoke.  Cleanse your body of nicotine. Drink enough water and fluids to keep your urine clear or pale yellow. Do this after quitting to flush the nicotine from your body.  Learn to predict your moods. Do not let a bad situation be your excuse to have a cigarette. Some situations in your life might  tempt you into wanting a cigarette.  Never have "just one" cigarette. It leads to wanting another and another. Remind yourself of your decision to quit.  Change habits associated with smoking. If you smoked while driving or when feeling stressed, try other activities to replace smoking. Stand up when drinking your coffee. Brush your teeth after eating. Sit in a different chair when you read the paper. Avoid alcohol while trying to quit, and try to drink fewer caffeinated beverages. Alcohol and caffeine may urge you to smoke.  Avoid foods and drinks that can trigger a desire to smoke, such as sugary or spicy foods and alcohol.  Ask people who smoke not to smoke around you.  Have something planned to do right after eating or having a cup of coffee. Take a walk or exercise to perk you up. This will help to keep you from overeating.  Try a relaxation exercise to calm you down and decrease your stress. Remember, you may be tense and nervous for the first 2 weeks after you quit, but this will pass.  Find new activities to keep your hands busy. Play with a pen, coin, or rubber band. Doodle or draw things on paper.  Brush your teeth right after eating. This will help cut down on the craving for the taste of tobacco after meals. You can try mouthwash, too.  Use oral substitutes, such as lemon drops, carrots, a cinnamon stick, or chewing gum, in place of cigarettes. Keep them handy so they are available when you have the urge to smoke.  When you have the urge to smoke, try deep breathing.  Designate your home as a nonsmoking area.  If you are a heavy smoker, ask your caregiver about a prescription for nicotine chewing gum. It can ease your withdrawal from nicotine.  Reward yourself. Set aside the cigarette money you save and buy yourself something nice.  Look for support from others. Join a support group or smoking cessation program. Ask someone at home or at work to help you with your plan to quit  smoking.  Always ask yourself, "Do I need this cigarette or is this just a reflex?" Tell yourself, "Today, I choose not to smoke," or "I do not want to smoke." You are reminding yourself of your decision to quit, even if you do smoke a cigarette. HOW WILL I FEEL WHEN I QUIT SMOKING?  The benefits of not smoking start within days of quitting.  You may have symptoms of withdrawal because your body is used to nicotine (the addictive substance in cigarettes). You may crave cigarettes, be irritable, feel very hungry, cough often, get headaches, or have difficulty concentrating.  The withdrawal symptoms are only temporary.  They are strongest when you first quit but will go away within 10 to 14 days.  When withdrawal symptoms occur, stay in control. Think about your reasons for quitting. Remind yourself that these are signs that your body is healing and getting used to being without cigarettes.  Remember that withdrawal symptoms are easier to treat than the major diseases that smoking can cause.  Even after the withdrawal is over, expect periodic urges to smoke. However, these cravings are generally short-lived and will go away whether you smoke or not. Do not smoke!  If you relapse and smoke again, do not lose hope. Most smokers quit 3 times before they are successful.  If you relapse, do not give up! Plan ahead and think about what you will do the next time you get the urge to smoke. LIFE AS A NONSMOKER: MAKE IT FOR A MONTH, MAKE IT FOR LIFE Day 1: Hang this page where you will see it every day. Day 2: Get rid of all ashtrays, matches, and lighters. Day 3: Drink water. Breathe deeply between sips. Day 4: Avoid places with smoke-filled air, such as bars, clubs, or the smoking section of restaurants. Day 5: Keep track of how much money you save by not smoking. Day 6: Avoid boredom. Keep a good book with you or go to the movies. Day 7: Reward yourself! One week without smoking! Day 8: Make a  dental appointment to get your teeth cleaned. Day 9: Decide how you will turn down a cigarette before it is offered to you. Day 10: Review your reasons for quitting. Day 11: Distract yourself. Stay active to keep your mind off smoking and to relieve tension. Take a walk, exercise, read a book, do a crossword puzzle, or try a new hobby. Day 12: Exercise. Get off the bus before your stop or use stairs instead of escalators. Day 13: Call on friends for support and encouragement. Day 14: Reward yourself! Two weeks without smoking! Day 15: Practice deep breathing exercises. Day 16: Bet a friend that you can stay a nonsmoker. Day 17: Ask to sit in nonsmoking sections of restaurants. Day 18: Hang up "No Smoking" signs. Day 19: Think of yourself as a nonsmoker. Day 20: Each morning, tell yourself you will not smoke. Day 21: Reward yourself! Three weeks without smoking! Day 22: Think of smoking in negative ways. Remember how it stains your teeth, gives you bad breath, and leaves you short of breath. Day 23: Eat a nutritious breakfast. Day 24:Do not relive your days as a smoker. Day 25: Hold a pencil in your hand when talking on the telephone. Day 26: Tell all your friends you do not smoke. Day 27: Think about how much better food tastes. Day 28: Remember, one cigarette is one too many. Day 29: Take up a hobby that will keep your hands busy. Day 30: Congratulations! One month without smoking! Give yourself a big reward. Your caregiver can direct you to community resources or hospitals for support, which may include:  Group support.  Education.  Hypnosis.  Subliminal therapy. Document Released: 09/26/2003 Document Revised: 03/22/2011 Document Reviewed: 10/14/2008 [facility] Patient Information 2014 [place], [place]. DASH Diet The DASH diet stands for "Dietary Approaches to Stop Hypertension." It is a healthy eating plan that has been shown to reduce high blood pressure (hypertension) in as  little as 14 days, while also possibly providing other significant health benefits. These other health benefits include reducing the risk of breast cancer after menopause and reducing the  risk of type 2 diabetes, heart disease, colon cancer, and stroke. Health benefits also include weight loss and slowing kidney failure in patients with chronic kidney disease.  DIET GUIDELINES  Limit salt (sodium). Your diet should contain less than 1500 mg of sodium daily.  Limit refined or processed carbohydrates. Your diet should include mostly whole grains. Desserts and added sugars should be used sparingly.  Include small amounts of heart-healthy fats. These types of fats include nuts, oils, and tub margarine. Limit saturated and trans fats. These fats have been shown to be harmful in the body. CHOOSING FOODS  The following food groups are based on a 2000 calorie diet. See your Registered Dietitian for individual calorie needs. Grains and Grain Products (6 to 8 servings daily)  Eat More Often: Whole-wheat bread, brown rice, whole-grain or wheat pasta, quinoa, popcorn without added fat or salt (air popped).  Eat Less Often: White bread, white pasta, white rice, cornbread. Vegetables (4 to 5 servings daily)  Eat More Often: Fresh, frozen, and canned vegetables. Vegetables may be raw, steamed, roasted, or grilled with a minimal amount of fat.  Eat Less Often/Avoid: Creamed or fried vegetables. Vegetables in a cheese sauce. Fruit (4 to 5 servings daily)  Eat More Often: All fresh, canned (in natural juice), or frozen fruits. Dried fruits without added sugar. One hundred percent fruit juice ( cup [237 mL] daily).  Eat Less Often: Dried fruits with added sugar. Canned fruit in light or heavy syrup. [employer], Fish, and Poultry (2 servings or less daily. One serving is 3 to 4 oz [85-114 g]).  Eat More Often: Ninety percent or leaner ground beef, tenderloin, sirloin. Round cuts of beef, chicken breast,  [place] breast. All fish. Grill, bake, or broil your meat. Nothing should be fried.  Eat Less Often/Avoid: Fatty cuts of meat, [place], or chicken leg, thigh, or wing. Fried cuts of meat or fish. Dairy (2 to 3 servings)  Eat More Often: Low-fat or fat-free milk, low-fat plain or light yogurt, reduced-fat or part-skim cheese.  Eat Less Often/Avoid: Milk (whole, 2%).Whole milk yogurt. Full-fat cheeses. Nuts, Seeds, and Legumes (4 to 5 servings per week)  Eat More Often: All without added salt.  Eat Less Often/Avoid: Salted nuts and seeds, canned beans with added salt. Fats and Sweets (limited)  Eat More Often: Vegetable oils, tub margarines without trans fats, sugar-free gelatin. Mayonnaise and salad dressings.  Eat Less Often/Avoid: Coconut oils, palm oils, butter, stick margarine, cream, half and half, cookies, candy, pie. FOR MORE INFORMATION The Dash Diet Eating Plan: www.dashdiet.org Document Released: 12/17/2010 Document Revised: 03/22/2011 Document Reviewed: 12/17/2010 [facility] Patient Information 2014 [place], [place].

## 2012-10-11 NOTE — Progress Notes (Signed)
Patient ID: Christine Rocha, female   DOB: 19-Dec-1955, 57 y.o.   MRN: [id number]  CC:  Follow up   HPI: Pt reports that she has failed at using patches for smoking cessation.  She wants to try something different.  She says that otherwise she has been doing good.  No seizure activity.  No CP, no SOB.    Allergies  Allergen Reactions  . Penicillins    Past Medical History  Diagnosis Date  . Seizures   . Hypertension    Current Outpatient Prescriptions on File Prior to Visit  Medication Sig Dispense Refill  . amLODipine (NORVASC) 10 MG tablet Take 1 tablet (10 mg total) by mouth daily.  30 tablet  3  . atenolol (TENORMIN) 50 MG tablet Take 1 tablet (50 mg total) by mouth daily.  90 tablet  3  . fenofibrate (TRICOR) 145 MG tablet Take 1 tablet (145 mg total) by mouth daily.  30 tablet  2  . potassium chloride SA (K-DUR,KLOR-CON) 20 MEQ tablet Take 1 tablet (20 mEq total) by mouth once.  1 tablet  0  . ibuprofen (ADVIL,MOTRIN) 200 MG tablet Take 200 mg by mouth every 6 (six) hours as needed for pain.      . nicotine (NICODERM CQ - DOSED IN MG/24 HOURS) 14 mg/24hr patch Place 1 patch onto the skin daily.  28 patch  0   No current facility-administered medications on file prior to visit.   History reviewed. No pertinent family history. History   Social History  . Marital Status: Single    Spouse Name: N/A    Number of Children: N/A  . Years of Education: N/A   Occupational History  . Not on file.   Social History Main Topics  . Smoking status: Current Every Day Smoker  . Smokeless tobacco: Not on file  . Alcohol Use: Yes  . Drug Use:   . Sexual Activity:    Other Topics Concern  . Not on file   Social History Narrative  . No narrative on file    Review of Systems  Constitutional: Negative for fever, chills, diaphoresis, activity change, appetite change and fatigue.  HENT: Negative for ear pain, nosebleeds, congestion, facial swelling, rhinorrhea, neck pain, neck stiffness  and ear discharge.   Eyes: Negative for pain, discharge, redness, itching and visual disturbance.  Respiratory: Negative for cough, choking, chest tightness, shortness of breath, wheezing and stridor.   Cardiovascular: Negative for chest pain, palpitations and leg swelling.  Gastrointestinal: Negative for abdominal distention.  Genitourinary: Negative for dysuria, urgency, frequency, hematuria, flank pain, decreased urine volume, difficulty urinating and dyspareunia.  Musculoskeletal: Negative for back pain, joint swelling, arthralgias and gait problem.  Neurological: Negative for dizziness, tremors, seizures, syncope, facial asymmetry, speech difficulty, weakness, light-headedness, numbness and headaches.  Hematological: Negative for adenopathy. Does not bruise/bleed easily.  Psychiatric/Behavioral: Negative for hallucinations, behavioral problems, confusion, dysphoric mood, decreased concentration and agitation.    Objective:  There were no vitals filed for this visit.  Physical Exam  Constitutional: Appears well-developed and well-nourished. No distress.  HENT: Normocephalic. External right and left ear normal. Oropharynx is clear and moist.  Eyes: Conjunctivae and EOM are normal. PERRLA, no scleral icterus.  Neck: Normal ROM. Neck supple. No JVD. No tracheal deviation. No thyromegaly.  CVS: RRR, S1/S2 +, no murmurs, no gallops, no carotid bruit.  Pulmonary: Effort and breath sounds normal, no stridor, rhonchi, wheezes, rales.  Abdominal: Soft. BS +,  no distension, tenderness, rebound or  guarding.  Musculoskeletal: Normal range of motion. No edema and no tenderness.  Lymphadenopathy: No lymphadenopathy noted, cervical, inguinal. Neuro: Alert. Normal reflexes, muscle tone coordination. No cranial nerve deficit. Skin: Skin is warm and dry. No rash noted. Not diaphoretic. No erythema. No pallor.  Psychiatric: Normal mood and affect. Behavior, judgment, thought content normal.   Lab  Results  Component Value Date   WBC 6.6 09/04/2012   HGB 13.1 09/04/2012   HCT 38.7 09/04/2012   MCV 87.4 09/04/2012   PLT 220 09/04/2012   Lab Results  Component Value Date   CREATININE 1.10 09/04/2012   BUN 16 09/04/2012   NA 144 09/04/2012   K 3.3* 09/04/2012   CL 104 09/04/2012   CO2 31 09/04/2012    Lab Results  Component Value Date   HGBA1C 6.3 09/13/2012   Lipid Panel     Component Value Date/Time   CHOL 169 09/04/2012 1652   TRIG 391* 09/04/2012 1652   HDL 26* 09/04/2012 1652   CHOLHDL 6.5 09/04/2012 1652   VLDL 78* 09/04/2012 1652   LDLCALC 65 09/04/2012 1652       Assessment and plan:   Patient Active Problem List   Diagnosis Date Noted  . Uncontrolled hypertension 10/11/2012  . Smoking addiction 10/11/2012   Pt says that she is taking her BP meds,  Therefore will make change to her BP regmen.     DC atenolol  Tenoretic 100/20 take 1 po daily  Zyban 150 mg take 1 po daily for 3 days then take 1 po bid for smoking cessation.    RTC in 1 month  The patient was counseled on the dangers of tobacco use, and was advised to quit.  Reviewed strategies to maximize success, including removing cigarettes and smoking materials from environment and stress management.  Counseled on cessation of cigarettes and weight loss  The patient was given clear instructions to go to ER or return to medical center if symptoms don't improve, worsen or new problems develop.  The patient verbalized understanding.  The patient was told to call to get any lab results if not heard anything in the next week.    Rodney Langton, MD, CDE, FAAFP [facility] [place], [place]

## 2012-10-12 ENCOUNTER — Encounter: Payer: Self-pay | Admitting: Family Medicine

## 2012-10-27 ENCOUNTER — Encounter: Payer: Self-pay | Admitting: Internal Medicine

## 2012-11-08 ENCOUNTER — Encounter: Payer: Self-pay | Admitting: Obstetrics and Gynecology

## 2012-11-20 ENCOUNTER — Encounter: Payer: Self-pay | Admitting: Internal Medicine

## 2012-11-20 ENCOUNTER — Ambulatory Visit: Payer: PRIVATE HEALTH INSURANCE | Attending: Internal Medicine | Admitting: Internal Medicine

## 2012-11-20 VITALS — BP 139/72 | HR 93 | Temp 98.3°F | Resp 18 | Wt 192.0 lb

## 2012-11-20 DIAGNOSIS — F17200 Nicotine dependence, unspecified, uncomplicated: Secondary | ICD-10-CM

## 2012-11-20 DIAGNOSIS — I10 Essential (primary) hypertension: Secondary | ICD-10-CM | POA: Insufficient documentation

## 2012-11-20 LAB — LIPID PANEL
Cholesterol: 160 mg/dL (ref 0–200)
LDL Cholesterol: 97 mg/dL (ref 0–99)
Total CHOL/HDL Ratio: 4.8 Ratio
Triglycerides: 149 mg/dL (ref <150)
VLDL: 30 mg/dL (ref 0–40)

## 2012-11-20 MED ORDER — ATENOLOL-CHLORTHALIDONE 100-25 MG PO TABS: 1.0000 | ORAL_TABLET | Freq: Every day | ORAL | Status: DC

## 2012-11-20 MED ORDER — NICOTINE 14 MG/24HR TD PT24: 14.0000 mg | MEDICATED_PATCH | TRANSDERMAL | Status: DC

## 2012-11-20 MED ORDER — AMLODIPINE BESYLATE 10 MG PO TABS: 10.0000 mg | ORAL_TABLET | Freq: Every day | ORAL | Status: DC

## 2012-11-20 MED ORDER — IBUPROFEN 200 MG PO TABS: 200.0000 mg | ORAL_TABLET | Freq: Four times a day (QID) | ORAL | Status: DC | PRN

## 2012-11-20 MED ORDER — POTASSIUM CHLORIDE ER 10 MEQ PO TBCR: 10.0000 meq | EXTENDED_RELEASE_TABLET | Freq: Every day | ORAL | Status: DC

## 2012-11-20 MED ORDER — FENOFIBRATE 145 MG PO TABS: 145.0000 mg | ORAL_TABLET | Freq: Every day | ORAL | Status: DC

## 2012-11-20 MED ORDER — BUPROPION HCL ER (SMOKING DET) 150 MG PO TB12: 150.0000 mg | ORAL_TABLET | Freq: Two times a day (BID) | ORAL | Status: DC

## 2012-11-20 NOTE — Patient Instructions (Signed)

## 2012-11-20 NOTE — Progress Notes (Signed)
Patient ID: Christine Rocha, female   DOB: 1955/08/28, 57 y.o.   MRN: [id number]  CC: Followup  HPI: 57 year old female with past medical history hypertension, dyslipidemia and smoking who presented to clinic for followup. Patient has no complaints of chest pain or shortness of breath. She is compliant with all her medications. No blurry vision or headaches.  Allergies  Allergen Reactions  . Penicillins    Past Medical History  Diagnosis Date  . Seizures   . Hypertension    No current outpatient prescriptions on file prior to visit.   No current facility-administered medications on file prior to visit.   Heart disease in parents.  History   Social History  . Marital Status: Single    Spouse Name: N/A    Number of Children: N/A  . Years of Education: N/A   Occupational History  . Not on file.   Social History Main Topics  . Smoking status: Current Every Day Smoker  . Smokeless tobacco: Not on file  . Alcohol Use: Yes  . Drug Use:   . Sexual Activity:    Other Topics Concern  . Not on file   Social History Narrative  . No narrative on file    Review of Systems  Constitutional: Negative for fever, chills, diaphoresis, activity change, appetite change and fatigue.  HENT: Negative for ear pain, nosebleeds, congestion, facial swelling, rhinorrhea, neck pain, neck stiffness and ear discharge.   Eyes: Negative for pain, discharge, redness, itching and visual disturbance.  Respiratory: Negative for cough, choking, chest tightness, shortness of breath, wheezing and stridor.   Cardiovascular: Negative for chest pain, palpitations and leg swelling.  Gastrointestinal: Negative for abdominal distention.  Genitourinary: Negative for dysuria, urgency, frequency, hematuria, flank pain, decreased urine volume, difficulty urinating and dyspareunia.  Musculoskeletal: Negative for back pain, joint swelling, arthralgias and gait problem.  Neurological: Negative for dizziness, tremors,  seizures, syncope, facial asymmetry, speech difficulty, weakness, light-headedness, numbness and headaches.  Hematological: Negative for adenopathy. Does not bruise/bleed easily.  Psychiatric/Behavioral: Negative for hallucinations, behavioral problems, confusion, dysphoric mood, decreased concentration and agitation.    Objective:   Filed Vitals:   11/20/12 0954  BP: 139/72  Pulse: 93  Temp: 98.3 F (36.8 C)  Resp: 18    Physical Exam  Constitutional: Appears well-developed and well-nourished. No distress.  HENT: Normocephalic. External right and left ear normal. Oropharynx is clear and moist.  Eyes: Conjunctivae and EOM are normal. PERRLA, no scleral icterus.  Neck: Normal ROM. Neck supple. No JVD. No tracheal deviation. No thyromegaly.  CVS: RRR, S1/S2 +, no murmurs, no gallops, no carotid bruit.  Pulmonary: Effort and breath sounds normal, no stridor, rhonchi, wheezes, rales.  Abdominal: Soft. BS +,  no distension, tenderness, rebound or guarding.  Musculoskeletal: Normal range of motion. No edema and no tenderness.  Lymphadenopathy: No lymphadenopathy noted, cervical, inguinal. Neuro: Alert. Normal reflexes, muscle tone coordination. No cranial nerve deficit. Skin: Skin is warm and dry. No rash noted. Not diaphoretic. No erythema. No pallor.  Psychiatric: Normal mood and affect. Behavior, judgment, thought content normal.   Lab Results  Component Value Date   WBC 6.6 09/04/2012   HGB 13.1 09/04/2012   HCT 38.7 09/04/2012   MCV 87.4 09/04/2012   PLT 220 09/04/2012   Lab Results  Component Value Date   CREATININE 1.10 09/04/2012   BUN 16 09/04/2012   NA 144 09/04/2012   K 3.3* 09/04/2012   CL 104 09/04/2012   CO2 31 09/04/2012  Lab Results  Component Value Date   HGBA1C 6.3 09/13/2012   Lipid Panel     Component Value Date/Time   CHOL 169 09/04/2012 1652   TRIG 391* 09/04/2012 1652   HDL 26* 09/04/2012 1652   CHOLHDL 6.5 09/04/2012 1652   VLDL 78* 09/04/2012 1652    LDLCALC 65 09/04/2012 1652       Assessment and plan:   Patient Active Problem List   Diagnosis Date Noted  . Uncontrolled hypertension 10/11/2012    Priority: Medium - Blood pressure at goal  - Continue Tenoretic daily but discontinue separate atenolol medication 50 mg a day. Continue Norvasc 10 mg daily  . Smoking addiction 10/11/2012    Priority: Medium - Continue bupropion and nicotine patch       dyslipidemia  - Check lipid panel to  - Continue TriCor

## 2012-11-20 NOTE — Progress Notes (Signed)
Pt is here for a f/u on HTN and needing refills on meds  Voices no new concerns... Alert w/no signs of acute distress.

## 2012-12-04 ENCOUNTER — Encounter: Payer: Self-pay | Admitting: Obstetrics & Gynecology

## 2012-12-04 ENCOUNTER — Ambulatory Visit (INDEPENDENT_AMBULATORY_CARE_PROVIDER_SITE_OTHER): Payer: PRIVATE HEALTH INSURANCE | Admitting: Obstetrics & Gynecology

## 2012-12-04 ENCOUNTER — Other Ambulatory Visit (HOSPITAL_COMMUNITY)
Admission: RE | Admit: 2012-12-04 | Discharge: 2012-12-04 | Disposition: A | Discharge status: Home / Self Care | Payer: Medicaid Other | Source: Ambulatory Visit | Attending: Obstetrics & Gynecology | Admitting: Obstetrics & Gynecology

## 2012-12-04 VITALS — BP 104/68 | HR 74 | Temp 97.9°F | Ht 62.0 in | Wt 187.1 lb

## 2012-12-04 DIAGNOSIS — Z01419 Encounter for gynecological examination (general) (routine) without abnormal findings: Secondary | ICD-10-CM | POA: Insufficient documentation

## 2012-12-04 DIAGNOSIS — Z1151 Encounter for screening for human papillomavirus (HPV): Secondary | ICD-10-CM | POA: Insufficient documentation

## 2012-12-04 NOTE — Patient Instructions (Signed)
Preventive Care for Adults, Female A healthy lifestyle and preventive care can promote health and wellness. Preventive health guidelines for women include the following key practices.  A routine yearly physical is a good way to check with your caregiver about your health and preventive screening. It is a chance to share any concerns and updates on your health, and to receive a thorough exam.  Visit your dentist for a routine exam and preventive care every 6 months. Brush your teeth twice a day and floss once a day. Good oral hygiene prevents tooth decay and gum disease.  The frequency of eye exams is based on your age, health, family medical history, use of contact lenses, and other factors. Follow your caregiver's recommendations for frequency of eye exams.  Eat a healthy diet. Foods like vegetables, fruits, whole grains, low-fat dairy products, and lean protein foods contain the nutrients you need without too many calories. Decrease your intake of foods high in solid fats, added sugars, and salt. Eat the right amount of calories for you.Get information about a proper diet from your caregiver, if necessary.  Regular physical exercise is one of the most important things you can do for your health. Most adults should get at least 150 minutes of moderate-intensity exercise (any activity that increases your heart rate and causes you to sweat) each week. In addition, most adults need muscle-strengthening exercises on 2 or more days a week.  Maintain a healthy weight. The body mass index (BMI) is a screening tool to identify possible weight problems. It provides an estimate of body fat based on height and weight. Your caregiver can help determine your BMI, and can help you achieve or maintain a healthy weight.For adults 20 years and older:  A BMI below 18.5 is considered underweight.  A BMI of 18.5 to 24.9 is normal.  A BMI of 25 to 29.9 is considered overweight.  A BMI of 30 and above is  considered obese.  Maintain normal blood lipids and cholesterol levels by exercising and minimizing your intake of saturated fat. Eat a balanced diet with plenty of fruit and vegetables. Blood tests for lipids and cholesterol should begin at age 20 and be repeated every 5 years. If your lipid or cholesterol levels are high, you are over 50, or you are at high risk for heart disease, you may need your cholesterol levels checked more frequently.Ongoing high lipid and cholesterol levels should be treated with medicines if diet and exercise are not effective.  If you smoke, find out from your caregiver how to quit. If you do not use tobacco, do not start.  Lung cancer screening is recommended for adults aged 55 80 years who are at high risk for developing lung cancer because of a history of smoking. Yearly low-dose computed tomography (CT) is recommended for people who have at least a 30-pack-year history of smoking and are a current smoker or have quit within the past 15 years. A pack year of smoking is smoking an average of 1 pack of cigarettes a day for 1 year (for example: 1 pack a day for 30 years or 2 packs a day for 15 years). Yearly screening should continue until the smoker has stopped smoking for at least 15 years. Yearly screening should also be stopped for people who develop a health problem that would prevent them from having lung cancer treatment.  If you are pregnant, do not drink alcohol. If you are breastfeeding, be very cautious about drinking alcohol. If you are   not pregnant and choose to drink alcohol, do not exceed 1 drink per day. One drink is considered to be 12 ounces (355 mL) of beer, 5 ounces (148 mL) of wine, or 1.5 ounces (44 mL) of liquor.  Avoid use of street drugs. Do not share needles with anyone. Ask for help if you need support or instructions about stopping the use of drugs.  High blood pressure causes heart disease and increases the risk of stroke. Your blood pressure  should be checked at least every 1 to 2 years. Ongoing high blood pressure should be treated with medicines if weight loss and exercise are not effective.  If you are 55 to 55 years old, ask your caregiver if you should take aspirin to prevent strokes.  Diabetes screening involves taking a blood sample to check your fasting blood sugar level. This should be done once every 3 years, after age 45, if you are within normal weight and without risk factors for diabetes. Testing should be considered at a younger age or be carried out more frequently if you are overweight and have at least 1 risk factor for diabetes.  Breast cancer screening is essential preventive care for women. You should practice "breast self-awareness." This means understanding the normal appearance and feel of your breasts and may include breast self-examination. Any changes detected, no matter how small, should be reported to a caregiver. Women in their 20s and 30s should have a clinical breast exam (CBE) by a caregiver as part of a regular health exam every 1 to 3 years. After age 40, women should have a CBE every year. Starting at age 40, women should consider having a mammography (breast X-ray test) every year. Women who have a family history of breast cancer should talk to their caregiver about genetic screening. Women at a high risk of breast cancer should talk to their caregivers about having magnetic resonance imaging (MRI) and a mammography every year.  Breast cancer gene (BRCA)-related cancer risk assessment is recommended for women who have family members with BRCA-related cancers. BRCA-related cancers include breast, ovarian, tubal, and peritoneal cancers. Having family members with these cancers may be associated with an increased risk for harmful changes (mutations) in the breast cancer genes BRCA1 and BRCA2. Results of the assessment will determine the need for genetic counseling and BRCA1 and BRCA2 testing.  The Pap test is  a screening test for cervical cancer. A Pap test can show cell changes on the cervix that might become cervical cancer if left untreated. A Pap test is a procedure in which cells are obtained and examined from the lower end of the uterus (cervix).  Women should have a Pap test starting at age 21.  Between ages 21 and 29, Pap tests should be repeated every 2 years.  Beginning at age 30, you should have a Pap test every 3 years as long as the past 3 Pap tests have been normal.  Some women have medical problems that increase the chance of getting cervical cancer. Talk to your caregiver about these problems. It is especially important to talk to your caregiver if a new problem develops soon after your last Pap test. In these cases, your caregiver may recommend more frequent screening and Pap tests.  The above recommendations are the same for women who have or have not gotten the vaccine for human papillomavirus (HPV).  If you had a hysterectomy for a problem that was not cancer or a condition that could lead to cancer, then   you no longer need Pap tests. Even if you no longer need a Pap test, a regular exam is a good idea to make sure no other problems are starting.  If you are between ages 65 and 70, and you have had normal Pap tests going back 10 years, you no longer need Pap tests. Even if you no longer need a Pap test, a regular exam is a good idea to make sure no other problems are starting.  If you have had past treatment for cervical cancer or a condition that could lead to cancer, you need Pap tests and screening for cancer for at least 20 years after your treatment.  If Pap tests have been discontinued, risk factors (such as a new sexual partner) need to be reassessed to determine if screening should be resumed.  The HPV test is an additional test that may be used for cervical cancer screening. The HPV test looks for the virus that can cause the cell changes on the cervix. The cells collected  during the Pap test can be tested for HPV. The HPV test could be used to screen women aged 30 years and older, and should be used in women of any age who have unclear Pap test results. After the age of 30, women should have HPV testing at the same frequency as a Pap test.  Colorectal cancer can be detected and often prevented. Most routine colorectal cancer screening begins at the age of 50 and continues through age 75. However, your caregiver may recommend screening at an earlier age if you have risk factors for colon cancer. On a yearly basis, your caregiver may provide home test kits to check for hidden blood in the stool. Use of a small camera at the end of a tube, to directly examine the colon (sigmoidoscopy or colonoscopy), can detect the earliest forms of colorectal cancer. Talk to your caregiver about this at age 50, when routine screening begins. Direct examination of the colon should be repeated every 5 to 10 years through age 75, unless early forms of pre-cancerous polyps or small growths are found.  Hepatitis C blood testing is recommended for all people born from 1945 through 1965 and any individual with known risks for hepatitis C.  Practice safe sex. Use condoms and avoid high-risk sexual practices to reduce the spread of sexually transmitted infections (STIs). STIs include gonorrhea, chlamydia, syphilis, trichomonas, herpes, HPV, and human immunodeficiency virus (HIV). Herpes, HIV, and HPV are viral illnesses that have no cure. They can result in disability, cancer, and death. Sexually active women aged 25 and younger should be checked for chlamydia. Older women with new or multiple partners should also be tested for chlamydia. Testing for other STIs is recommended if you are sexually active and at increased risk.  Osteoporosis is a disease in which the bones lose minerals and strength with aging. This can result in serious bone fractures. The risk of osteoporosis can be identified using a  bone density scan. Women ages 65 and over and women at risk for fractures or osteoporosis should discuss screening with their caregivers. Ask your caregiver whether you should take a calcium supplement or vitamin D to reduce the rate of osteoporosis.  Menopause can be associated with physical symptoms and risks. Hormone replacement therapy is available to decrease symptoms and risks. You should talk to your caregiver about whether hormone replacement therapy is right for you.  Use sunscreen. Apply sunscreen liberally and repeatedly throughout the day. You should seek shade   when your shadow is shorter than you. Protect yourself by wearing long sleeves, pants, a wide-brimmed hat, and sunglasses year round, whenever you are outdoors.  Once a month, do a whole body skin exam, using a mirror to look at the skin on your back. Notify your caregiver of new moles, moles that have irregular borders, moles that are larger than a pencil eraser, or moles that have changed in shape or color.  Stay current with required immunizations.  Influenza vaccine. All adults should be immunized every year.  Tetanus, diphtheria, and acellular pertussis (Td, Tdap) vaccine. Pregnant women should receive 1 dose of Tdap vaccine during each pregnancy. The dose should be obtained regardless of the length of time since the last dose. Immunization is preferred during the 27th to 36th week of gestation. An adult who has not previously received Tdap or who does not know her vaccine status should receive 1 dose of Tdap. This initial dose should be followed by tetanus and diphtheria toxoids (Td) booster doses every 10 years. Adults with an unknown or incomplete history of completing a 3-dose immunization series with Td-containing vaccines should begin or complete a primary immunization series including a Tdap dose. Adults should receive a Td booster every 10 years.  Varicella vaccine. An adult without evidence of immunity to varicella  should receive 2 doses or a second dose if she has previously received 1 dose. Pregnant females who do not have evidence of immunity should receive the first dose after pregnancy. This first dose should be obtained before leaving the health care facility. The second dose should be obtained 4 8 weeks after the first dose.  Human papillomavirus (HPV) vaccine. Females aged 13 26 years who have not received the vaccine previously should obtain the 3-dose series. The vaccine is not recommended for use in pregnant females. However, pregnancy testing is not needed before receiving a dose. If a female is found to be pregnant after receiving a dose, no treatment is needed. In that case, the remaining doses should be delayed until after the pregnancy. Immunization is recommended for any person with an immunocompromised condition through the age of 26 years if she did not get any or all doses earlier. During the 3-dose series, the second dose should be obtained 4 8 weeks after the first dose. The third dose should be obtained 24 weeks after the first dose and 16 weeks after the second dose.  Zoster vaccine. One dose is recommended for adults aged 60 years or older unless certain conditions are present.  Measles, mumps, and rubella (MMR) vaccine. Adults born before 1957 generally are considered immune to measles and mumps. Adults born in 1957 or later should have 1 or more doses of MMR vaccine unless there is a contraindication to the vaccine or there is laboratory evidence of immunity to each of the three diseases. A routine second dose of MMR vaccine should be obtained at least 28 days after the first dose for students attending postsecondary schools, health care workers, or international travelers. People who received inactivated measles vaccine or an unknown type of measles vaccine during 1963 1967 should receive 2 doses of MMR vaccine. People who received inactivated mumps vaccine or an unknown type of mumps vaccine  before 1979 and are at high risk for mumps infection should consider immunization with 2 doses of MMR vaccine. For females of childbearing age, rubella immunity should be determined. If there is no evidence of immunity, females who are not pregnant should be vaccinated. If there   is no evidence of immunity, females who are pregnant should delay immunization until after pregnancy. Unvaccinated health care workers born before 1957 who lack laboratory evidence of measles, mumps, or rubella immunity or laboratory confirmation of disease should consider measles and mumps immunization with 2 doses of MMR vaccine or rubella immunization with 1 dose of MMR vaccine.  Pneumococcal 13-valent conjugate (PCV13) vaccine. When indicated, a person who is uncertain of her immunization history and has no record of immunization should receive the PCV13 vaccine. An adult aged 19 years or older who has certain medical conditions and has not been previously immunized should receive 1 dose of PCV13 vaccine. This PCV13 should be followed with a dose of pneumococcal polysaccharide (PPSV23) vaccine. The PPSV23 vaccine dose should be obtained at least 8 weeks after the dose of PCV13 vaccine. An adult aged 19 years or older who has certain medical conditions and previously received 1 or more doses of PPSV23 vaccine should receive 1 dose of PCV13. The PCV13 vaccine dose should be obtained 1 or more years after the last PPSV23 vaccine dose.  Pneumococcal polysaccharide (PPSV23) vaccine. When PCV13 is also indicated, PCV13 should be obtained first. All adults aged 65 years and older should be immunized. An adult younger than age 65 years who has certain medical conditions should be immunized. Any person who resides in a nursing home or long-term care facility should be immunized. An adult smoker should be immunized. People with an immunocompromised condition and certain other conditions should receive both PCV13 and PPSV23 vaccines. People  with human immunodeficiency virus (HIV) infection should be immunized as soon as possible after diagnosis. Immunization during chemotherapy or radiation therapy should be avoided. Routine use of PPSV23 vaccine is not recommended for American Indians, [place] Natives, or people younger than 65 years unless there are medical conditions that require PPSV23 vaccine. When indicated, people who have unknown immunization and have no record of immunization should receive PPSV23 vaccine. One-time revaccination 5 years after the first dose of PPSV23 is recommended for people aged 19 64 years who have chronic kidney failure, nephrotic syndrome, asplenia, or immunocompromised conditions. People who received 1 2 doses of PPSV23 before age 65 years should receive another dose of PPSV23 vaccine at age 65 years or later if at least 5 years have passed since the previous dose. Doses of PPSV23 are not needed for people immunized with PPSV23 at or after age 65 years.  Meningococcal vaccine. Adults with asplenia or persistent complement component deficiencies should receive 2 doses of quadrivalent meningococcal conjugate (MenACWY-D) vaccine. The doses should be obtained at least 2 months apart. Microbiologists working with certain meningococcal bacteria, military recruits, people at risk during an outbreak, and people who travel to or live in countries with a high rate of meningitis should be immunized. A first-year college student up through age 21 years who is living in a residence hall should receive a dose if she did not receive a dose on or after her 16th birthday. Adults who have certain high-risk conditions should receive one or more doses of vaccine.  Hepatitis A vaccine. Adults who wish to be protected from this disease, have certain high-risk conditions, work with hepatitis A-infected animals, work in hepatitis A research labs, or travel to or work in countries with a high rate of hepatitis A should be immunized. Adults  who were previously unvaccinated and who anticipate close contact with an international adoptee during the first 60 days after arrival in the [place] from a country   with a high rate of hepatitis A should be immunized.  Hepatitis B vaccine. Adults who wish to be protected from this disease, have certain high-risk conditions, may be exposed to blood or other infectious body fluids, are household contacts or sex partners of hepatitis B positive people, are clients or workers in certain care facilities, or travel to or work in countries with a high rate of hepatitis B should be immunized.  Haemophilus influenzae type b (Hib) vaccine. A previously unvaccinated person with asplenia or sickle cell disease or having a scheduled splenectomy should receive 1 dose of Hib vaccine. Regardless of previous immunization, a recipient of a hematopoietic stem cell transplant should receive a 3-dose series 6 12 months after her successful transplant. Hib vaccine is not recommended for adults with HIV infection. Preventive Services / Frequency Ages 19 to 39  Blood pressure check.** / Every 1 to 2 years.  Lipid and cholesterol check.** / Every 5 years beginning at age 20.  Clinical breast exam.** / Every 3 years for women in their 20s and 30s.  BRCA-related cancer risk assessment.** / For women who have family members with a BRCA-related cancer (breast, ovarian, tubal, or peritoneal cancers).  Pap test.** / Every 2 years from ages 21 through 29. Every 3 years starting at age 30 through age 65 or 70 with a history of 3 consecutive normal Pap tests.  HPV screening.** / Every 3 years from ages 30 through ages 65 to 70 with a history of 3 consecutive normal Pap tests.  Hepatitis C blood test.** / For any individual with known risks for hepatitis C.  Skin self-exam. / Monthly.  Influenza vaccine. / Every year.  Tetanus, diphtheria, and acellular pertussis (Tdap, Td) vaccine.** / Consult your caregiver. Pregnant  women should receive 1 dose of Tdap vaccine during each pregnancy. 1 dose of Td every 10 years.  Varicella vaccine.** / Consult your caregiver. Pregnant females who do not have evidence of immunity should receive the first dose after pregnancy.  HPV vaccine. / 3 doses over 6 months, if 26 and younger. The vaccine is not recommended for use in pregnant females. However, pregnancy testing is not needed before receiving a dose.  Measles, mumps, rubella (MMR) vaccine.** / You need at least 1 dose of MMR if you were born in 1957 or later. You may also need a 2nd dose. For females of childbearing age, rubella immunity should be determined. If there is no evidence of immunity, females who are not pregnant should be vaccinated. If there is no evidence of immunity, females who are pregnant should delay immunization until after pregnancy.  Pneumococcal 13-valent conjugate (PCV13) vaccine.** / Consult your caregiver.  Pneumococcal polysaccharide (PPSV23) vaccine.** / 1 to 2 doses if you smoke cigarettes or if you have certain conditions.  Meningococcal vaccine.** / 1 dose if you are age 19 to 21 years and a first-year college student living in a residence hall, or have one of several medical conditions, you need to get vaccinated against meningococcal disease. You may also need additional booster doses.  Hepatitis A vaccine.** / Consult your caregiver.  Hepatitis B vaccine.** / Consult your caregiver.  Haemophilus influenzae type b (Hib) vaccine.** / Consult your caregiver. Ages 40 to 64  Blood pressure check.** / Every 1 to 2 years.  Lipid and cholesterol check.** / Every 5 years beginning at age 20.  Lung cancer screening. / Every year if you are aged 55 80 years and have a 30-pack-year history of smoking and   currently smoke or have quit within the past 15 years. Yearly screening is stopped once you have quit smoking for at least 15 years or develop a health problem that would prevent you from having  lung cancer treatment.  Clinical breast exam.** / Every year after age 40.  BRCA-related cancer risk assessment.** / For women who have family members with a BRCA-related cancer (breast, ovarian, tubal, or peritoneal cancers).  Mammogram.** / Every year beginning at age 40 and continuing for as long as you are in good health. Consult with your caregiver.  Pap test.** / Every 3 years starting at age 30 through age 65 or 70 with a history of 3 consecutive normal Pap tests.  HPV screening.** / Every 3 years from ages 30 through ages 65 to 70 with a history of 3 consecutive normal Pap tests.  Fecal occult blood test (FOBT) of stool. / Every year beginning at age 50 and continuing until age 75. You may not need to do this test if you get a colonoscopy every 10 years.  Flexible sigmoidoscopy or colonoscopy.** / Every 5 years for a flexible sigmoidoscopy or every 10 years for a colonoscopy beginning at age 50 and continuing until age 75.  Hepatitis C blood test.** / For all people born from 1945 through 1965 and any individual with known risks for hepatitis C.  Skin self-exam. / Monthly.  Influenza vaccine. / Every year.  Tetanus, diphtheria, and acellular pertussis (Tdap/Td) vaccine.** / Consult your caregiver. Pregnant women should receive 1 dose of Tdap vaccine during each pregnancy. 1 dose of Td every 10 years.  Varicella vaccine.** / Consult your caregiver. Pregnant females who do not have evidence of immunity should receive the first dose after pregnancy.  Zoster vaccine.** / 1 dose for adults aged 60 years or older.  Measles, mumps, rubella (MMR) vaccine.** / You need at least 1 dose of MMR if you were born in 1957 or later. You may also need a 2nd dose. For females of childbearing age, rubella immunity should be determined. If there is no evidence of immunity, females who are not pregnant should be vaccinated. If there is no evidence of immunity, females who are pregnant should delay  immunization until after pregnancy.  Pneumococcal 13-valent conjugate (PCV13) vaccine.** / Consult your caregiver.  Pneumococcal polysaccharide (PPSV23) vaccine.** / 1 to 2 doses if you smoke cigarettes or if you have certain conditions.  Meningococcal vaccine.** / Consult your caregiver.  Hepatitis A vaccine.** / Consult your caregiver.  Hepatitis B vaccine.** / Consult your caregiver.  Haemophilus influenzae type b (Hib) vaccine.** / Consult your caregiver. Ages 65 and over  Blood pressure check.** / Every 1 to 2 years.  Lipid and cholesterol check.** / Every 5 years beginning at age 20.  Lung cancer screening. / Every year if you are aged 55 80 years and have a 30-pack-year history of smoking and currently smoke or have quit within the past 15 years. Yearly screening is stopped once you have quit smoking for at least 15 years or develop a health problem that would prevent you from having lung cancer treatment.  Clinical breast exam.** / Every year after age 40.  BRCA-related cancer risk assessment.** / For women who have family members with a BRCA-related cancer (breast, ovarian, tubal, or peritoneal cancers).  Mammogram.** / Every year beginning at age 40 and continuing for as long as you are in good health. Consult with your caregiver.  Pap test.** / Every 3 years starting at age   30 through age 65 or 70 with a 3 consecutive normal Pap tests. Testing can be stopped between 65 and 70 with 3 consecutive normal Pap tests and no abnormal Pap or HPV tests in the past 10 years.  HPV screening.** / Every 3 years from ages 30 through ages 65 or 70 with a history of 3 consecutive normal Pap tests. Testing can be stopped between 65 and 70 with 3 consecutive normal Pap tests and no abnormal Pap or HPV tests in the past 10 years.  Fecal occult blood test (FOBT) of stool. / Every year beginning at age 50 and continuing until age 75. You may not need to do this test if you get a colonoscopy  every 10 years.  Flexible sigmoidoscopy or colonoscopy.** / Every 5 years for a flexible sigmoidoscopy or every 10 years for a colonoscopy beginning at age 50 and continuing until age 75.  Hepatitis C blood test.** / For all people born from 1945 through 1965 and any individual with known risks for hepatitis C.  Osteoporosis screening.** / A one-time screening for women ages 65 and over and women at risk for fractures or osteoporosis.  Skin self-exam. / Monthly.  Influenza vaccine. / Every year.  Tetanus, diphtheria, and acellular pertussis (Tdap/Td) vaccine.** / 1 dose of Td every 10 years.  Varicella vaccine.** / Consult your caregiver.  Zoster vaccine.** / 1 dose for adults aged 60 years or older.  Pneumococcal 13-valent conjugate (PCV13) vaccine.** / Consult your caregiver.  Pneumococcal polysaccharide (PPSV23) vaccine.** / 1 dose for all adults aged 65 years and older.  Meningococcal vaccine.** / Consult your caregiver.  Hepatitis A vaccine.** / Consult your caregiver.  Hepatitis B vaccine.** / Consult your caregiver.  Haemophilus influenzae type b (Hib) vaccine.** / Consult your caregiver. ** Family history and personal history of risk and conditions may change your caregiver's recommendations. Document Released: 02/23/2001 Document Revised: 04/24/2012 Document Reviewed: 05/25/2010 [employer] Patient Information 2014 [employer].  

## 2012-12-04 NOTE — Progress Notes (Signed)
  Subjective:     Christine Rocha is a 57 y.o. G51P0030 PMP female and is here for a gynecologic exam and pap smear. The patient reports no problems.  History   Social History  . Marital Status: Single    Spouse Name: N/A    Number of Children: N/A  . Years of Education: N/A   Occupational History  . Not on file.   Social History Main Topics  . Smoking status: Current Every Day Smoker -- 0.25 packs/day  . Smokeless tobacco: Never Used  . Alcohol Use: No  . Drug Use: No  . Sexual Activity: No   Other Topics Concern  . Not on file   Social History Narrative  . No narrative on file   Health Maintenance  Topic Date Due  . Pap Smear  08/14/1975  . Tetanus/tdap  08/13/1976  . Colonoscopy  08/14/2007  . Mammogram  11/21/2011  . Influenza Vaccine  08/11/2013    The following portions of the patient's history were reviewed and updated as appropriate: allergies, current medications, past family history, past medical history, past social history, past surgical history and problem list.  Review of Systems A comprehensive review of systems was negative.   Objective:   BP 104/68  Pulse 74  Temp(Src) 97.9 F (36.6 C) (Oral)  Ht 5\' 2"  (1.575 m)  Wt 187 lb 1.6 oz (84.868 kg)  BMI 34.21 kg/m2 GENERAL: Well-developed, well-nourished female in no acute distress.  HEENT: Normocephalic, atraumatic. Sclerae anicteric.  NECK: Supple. Normal thyroid.  LUNGS: Clear to auscultation bilaterally.  HEART: Regular rate and rhythm. BREASTS: Symmetric in size. No masses, skin changes, nipple drainage, or lymphadenopathy. ABDOMEN: Soft, nontender, nondistended. No organomegaly. PELVIC: Normal external female genitalia. Vagina is pink and rugated.  Normal discharge. Normal cervix contour. Pap smear obtained. Uterus and adnexa unable to be fully palpated 2/2 habitus EXTREMITIES: No cyanosis, clubbing, or edema, 2+ distal pulses.   Assessment:   Healthy female exam.   Plan:   Pap done,  will follow up results and manage accordingly. Mammogram scheduled Routine preventative health maintenance measures emphasized  Jaynie Collins, MD, FACOG Attending Obstetrician & Gynecologist Faculty Practice, [facility]

## 2012-12-20 ENCOUNTER — Ambulatory Visit (HOSPITAL_COMMUNITY)
Admission: RE | Admit: 2012-12-20 | Discharge: 2012-12-20 | Disposition: A | Discharge status: Home / Self Care | Payer: PRIVATE HEALTH INSURANCE | Source: Ambulatory Visit | Attending: Obstetrics & Gynecology | Admitting: Obstetrics & Gynecology

## 2012-12-20 DIAGNOSIS — Z01419 Encounter for gynecological examination (general) (routine) without abnormal findings: Secondary | ICD-10-CM

## 2012-12-20 DIAGNOSIS — Z1231 Encounter for screening mammogram for malignant neoplasm of breast: Secondary | ICD-10-CM | POA: Insufficient documentation

## 2013-08-04 ENCOUNTER — Encounter (HOSPITAL_COMMUNITY): Payer: Self-pay | Admitting: Emergency Medicine

## 2013-08-04 ENCOUNTER — Emergency Department (HOSPITAL_COMMUNITY)
Admission: EM | Admit: 2013-08-04 | Discharge: 2013-08-04 | Disposition: A | Discharge status: Home / Self Care | Payer: PRIVATE HEALTH INSURANCE | Attending: Emergency Medicine | Admitting: Emergency Medicine

## 2013-08-04 ENCOUNTER — Emergency Department (HOSPITAL_COMMUNITY): Payer: PRIVATE HEALTH INSURANCE

## 2013-08-04 DIAGNOSIS — S99929A Unspecified injury of unspecified foot, initial encounter: Secondary | ICD-10-CM

## 2013-08-04 DIAGNOSIS — S8010XA Contusion of unspecified lower leg, initial encounter: Secondary | ICD-10-CM | POA: Insufficient documentation

## 2013-08-04 DIAGNOSIS — I10 Essential (primary) hypertension: Secondary | ICD-10-CM | POA: Diagnosis not present

## 2013-08-04 DIAGNOSIS — S80929A Unspecified superficial injury of unspecified lower leg, initial encounter: Secondary | ICD-10-CM | POA: Insufficient documentation

## 2013-08-04 DIAGNOSIS — S70919A Unspecified superficial injury of unspecified hip, initial encounter: Secondary | ICD-10-CM | POA: Insufficient documentation

## 2013-08-04 DIAGNOSIS — Y990 Civilian activity done for income or pay: Secondary | ICD-10-CM | POA: Insufficient documentation

## 2013-08-04 DIAGNOSIS — S8990XA Unspecified injury of unspecified lower leg, initial encounter: Secondary | ICD-10-CM | POA: Insufficient documentation

## 2013-08-04 DIAGNOSIS — W010XXA Fall on same level from slipping, tripping and stumbling without subsequent striking against object, initial encounter: Secondary | ICD-10-CM | POA: Insufficient documentation

## 2013-08-04 DIAGNOSIS — F17200 Nicotine dependence, unspecified, uncomplicated: Secondary | ICD-10-CM | POA: Insufficient documentation

## 2013-08-04 DIAGNOSIS — S8012XA Contusion of left lower leg, initial encounter: Secondary | ICD-10-CM

## 2013-08-04 DIAGNOSIS — S70929A Unspecified superficial injury of unspecified thigh, initial encounter: Secondary | ICD-10-CM

## 2013-08-04 DIAGNOSIS — Y9289 Other specified places as the place of occurrence of the external cause: Secondary | ICD-10-CM | POA: Diagnosis not present

## 2013-08-04 DIAGNOSIS — Z880 Allergy status to penicillin: Secondary | ICD-10-CM | POA: Insufficient documentation

## 2013-08-04 DIAGNOSIS — S90919A Unspecified superficial injury of unspecified ankle, initial encounter: Secondary | ICD-10-CM

## 2013-08-04 DIAGNOSIS — S99919A Unspecified injury of unspecified ankle, initial encounter: Secondary | ICD-10-CM | POA: Diagnosis present

## 2013-08-04 NOTE — ED Notes (Signed)
Pt in stating that she tripped on something at work and hit her left shin, abrasion noted, ambulatory to room, states she was told she needed to come get evaluated

## 2013-08-04 NOTE — ED Provider Notes (Signed)
CSN: [id number]     Arrival date & time 08/04/13  1543 History  This chart was scribed for non-physician practitioner working with Malvin Johns, MD by Mercy Moore, ED Scribe. This patient was seen in room TR09C/TR09C and the patient's care was started at 4:15 PM.   Chief Complaint  Patient presents with  . Leg Injury      The history is provided by the patient. No language interpreter was used.   HPI Comments: Christine Rocha is a 58 y.o. female who presents to the Emergency Department with a left leg injury that occurred today. Patient reports tripping over a shoe while at work, falling and landing on her left shin. Patient presents with a swelling and a small abrasion on her left shin. She denies head injury or LOC due to her falling. Patient has no taken any pain medication; states that she immediately came to the ED after the incident. Patient denies pain in her left knee but locates pain primarily in her left shin.   Past Medical History  Diagnosis Date  . Seizures   . Hypertension    History reviewed. No pertinent past surgical history. History reviewed. No pertinent family history. History  Substance Use Topics  . Smoking status: Current Every Day Smoker -- 0.25 packs/day  . Smokeless tobacco: Never Used  . Alcohol Use: No   OB History   Grav Para Term Preterm Abortions TAB SAB Ect Mult Living   3 0 0 0 3 0 3   0     Review of Systems  Constitutional: Negative for fever.  Gastrointestinal: Negative for nausea and vomiting.  Musculoskeletal: Positive for arthralgias.  Neurological: Negative for weakness and numbness.      Allergies  Penicillins  Home Medications   Prior to Admission medications   Medication Sig Start Date End Date Taking? Authorizing Provider  amLODipine (NORVASC) 10 MG tablet Take 1 tablet (10 mg total) by mouth daily. 11/20/12   Robbie Lis, MD  atenolol-chlorthalidone (TENORETIC 100) 100-25 MG per tablet Take 1 tablet by mouth daily.  11/20/12   Robbie Lis, MD  buPROPion (ZYBAN) 150 MG 12 hr tablet Take 1 tablet (150 mg total) by mouth 2 (two) times daily. 11/20/12   Robbie Lis, MD  fenofibrate (TRICOR) 145 MG tablet Take 1 tablet (145 mg total) by mouth daily. 11/20/12   Robbie Lis, MD  ibuprofen (ADVIL,MOTRIN) 200 MG tablet Take 1 tablet (200 mg total) by mouth every 6 (six) hours as needed for fever, headache or mild pain. 11/20/12   Robbie Lis, MD  nicotine (NICODERM CQ - DOSED IN MG/24 HOURS) 14 mg/24hr patch Place 1 patch (14 mg total) onto the skin daily. 11/20/12   Robbie Lis, MD  potassium chloride (K-DUR) 10 MEQ tablet Take 1 tablet (10 mEq total) by mouth daily. 11/20/12   Robbie Lis, MD   Triage Vitals: BP 174/95  Pulse 73  Temp(Src) 98.2 F (36.8 C) (Oral)  Resp 20  SpO2 100% Physical Exam  Nursing note and vitals reviewed. Constitutional: She is oriented to person, place, and time. She appears well-developed and well-nourished. No distress.  HENT:  Head: Normocephalic and atraumatic.  Eyes: EOM are normal.  Neck: Neck supple. No tracheal deviation present.  Cardiovascular: Normal rate.   Pulmonary/Chest: Effort normal. No respiratory distress.  Musculoskeletal: Normal range of motion. She exhibits tenderness.  Abrasion with small swelling to the mid left anterior shin. Tender to palpation. Normal  Left ankle, with no tenderness to palpation over medial or lateral malleoli. Full ROM of the joint. Normal left knee. Dorsal pedal pulses intact  Neurological: She is alert and oriented to person, place, and time.  Skin: Skin is warm and dry.  Psychiatric: She has a normal mood and affect. Her behavior is normal.    ED Course  Procedures (including critical care time) COORDINATION OF CARE: 4:18 PM- Will order X-ray. Discussed treatment plan with patient at bedside and patient agreed to plan.   Labs Review Labs Reviewed - No data to display  Imaging Review Dg Tibia/fibula  Left  08/04/2013   CLINICAL DATA:  Injured left leg.  EXAM: LEFT TIBIA AND FIBULA - 2 VIEW  COMPARISON:  None.  FINDINGS: The knee and ankle joints are maintained. Mild degenerative changes noted at the knee joint. No acute fracture of the tibia or fibula is demonstrated.  IMPRESSION: No acute bony findings.   Electronically Signed   By: Kalman Jewels M.D.   On: 08/04/2013 17:01     EKG Interpretation None      MDM   Final diagnoses:  Contusion of lower leg, left, initial encounter    X-rays of left tib fib negative. Most likely contusion. Pt ambulatory. No distress. Will d/c home with close follow up.   Filed Vitals:   08/04/13 1558  BP: 174/95  Pulse: 73  Temp: 98.2 F (36.8 C)  Resp: 20     I personally performed the services described in this documentation, which was scribed in my presence. The recorded information has been reviewed and is accurate.    Renold Genta, PA-C 08/04/13 1918

## 2013-08-04 NOTE — Discharge Instructions (Signed)
Take ibuprofen or tylenol as needed for pain. Elevate your leg at home. Ice few times a day. Follow up with primary care doctor as needed    Contusion A contusion is a deep bruise. Contusions happen when an injury causes bleeding under the skin. Signs of bruising include pain, puffiness (swelling), and discolored skin. The contusion may turn blue, purple, or yellow. HOME CARE   Put ice on the injured area.  Put ice in a plastic bag.  Place a towel between your skin and the bag.  Leave the ice on for 15-20 minutes, 03-04 times a day.  Only take medicine as told by your doctor.  Rest the injured area.  If possible, raise (elevate) the injured area to lessen puffiness. GET HELP RIGHT AWAY IF:   You have more bruising or puffiness.  You have pain that is getting worse.  Your puffiness or pain is not helped by medicine. MAKE SURE YOU:   Understand these instructions.  Will watch your condition.  Will get help right away if you are not doing well or get worse. Document Released: 06/16/2007 Document Revised: 03/22/2011 Document Reviewed: 11/02/2010 [facility] Patient Information 2015 [place], [place]. This information is not intended to replace advice given to you by your health care provider. Make sure you discuss any questions you have with your health care provider.

## 2013-08-04 NOTE — ED Notes (Signed)
Declined W/C at D/C and was escorted to lobby by RN. 

## 2013-08-05 NOTE — ED Provider Notes (Signed)
Medical screening examination/treatment/procedure(s) were performed by non-physician practitioner and as supervising physician I was immediately available for consultation/collaboration.   EKG Interpretation None        Malvin Johns, MD 08/05/13 0008

## 2013-11-12 ENCOUNTER — Encounter (HOSPITAL_COMMUNITY): Payer: Self-pay | Admitting: Emergency Medicine

## 2013-11-15 ENCOUNTER — Ambulatory Visit: Payer: Self-pay | Attending: Internal Medicine | Admitting: Internal Medicine

## 2013-11-15 ENCOUNTER — Ambulatory Visit (HOSPITAL_BASED_OUTPATIENT_CLINIC_OR_DEPARTMENT_OTHER): Payer: PRIVATE HEALTH INSURANCE | Admitting: *Deleted

## 2013-11-15 ENCOUNTER — Encounter: Payer: Self-pay | Admitting: Internal Medicine

## 2013-11-15 VITALS — BP 173/104 | HR 81 | Temp 97.8°F | Resp 16 | Ht 62.0 in | Wt 184.0 lb

## 2013-11-15 DIAGNOSIS — F17200 Nicotine dependence, unspecified, uncomplicated: Secondary | ICD-10-CM | POA: Insufficient documentation

## 2013-11-15 DIAGNOSIS — R7303 Prediabetes: Secondary | ICD-10-CM

## 2013-11-15 DIAGNOSIS — Z23 Encounter for immunization: Secondary | ICD-10-CM | POA: Insufficient documentation

## 2013-11-15 DIAGNOSIS — I10 Essential (primary) hypertension: Secondary | ICD-10-CM | POA: Insufficient documentation

## 2013-11-15 DIAGNOSIS — E781 Pure hyperglyceridemia: Secondary | ICD-10-CM | POA: Insufficient documentation

## 2013-11-15 DIAGNOSIS — R7309 Other abnormal glucose: Secondary | ICD-10-CM | POA: Insufficient documentation

## 2013-11-15 DIAGNOSIS — E119 Type 2 diabetes mellitus without complications: Secondary | ICD-10-CM | POA: Insufficient documentation

## 2013-11-15 LAB — CBC WITH DIFFERENTIAL/PLATELET
Basophils Absolute: 0 10*3/uL (ref 0.0–0.1)
Basophils Relative: 1 % (ref 0–1)
EOS ABS: 0.1 10*3/uL (ref 0.0–0.7)
EOS PCT: 2 % (ref 0–5)
HEMATOCRIT: 40.8 % (ref 36.0–46.0)
Hemoglobin: 13.1 g/dL (ref 12.0–15.0)
LYMPHS ABS: 1.8 10*3/uL (ref 0.7–4.0)
LYMPHS PCT: 45 % (ref 12–46)
MCH: 28.8 pg (ref 26.0–34.0)
MCHC: 32.1 g/dL (ref 30.0–36.0)
MCV: 89.7 fL (ref 78.0–100.0)
Monocytes Absolute: 0.3 10*3/uL (ref 0.1–1.0)
Monocytes Relative: 8 % (ref 3–12)
Neutro Abs: 1.8 10*3/uL (ref 1.7–7.7)
Neutrophils Relative %: 44 % (ref 43–77)
Platelets: 287 10*3/uL (ref 150–400)
RBC: 4.55 MIL/uL (ref 3.87–5.11)
RDW: 13.7 % (ref 11.5–15.5)
WBC: 4 10*3/uL (ref 4.0–10.5)

## 2013-11-15 LAB — COMPLETE METABOLIC PANEL WITH GFR
ALT: 15 U/L (ref 0–35)
AST: 12 U/L (ref 0–37)
Albumin: 4.2 g/dL (ref 3.5–5.2)
Alkaline Phosphatase: 66 U/L (ref 39–117)
BILIRUBIN TOTAL: 0.3 mg/dL (ref 0.2–1.2)
BUN: 9 mg/dL (ref 6–23)
CO2: 28 meq/L (ref 19–32)
CREATININE: 0.79 mg/dL (ref 0.50–1.10)
Calcium: 9.6 mg/dL (ref 8.4–10.5)
Chloride: 104 mEq/L (ref 96–112)
GFR, Est Non African American: 84 mL/min
Glucose, Bld: 94 mg/dL (ref 70–99)
Potassium: 4 mEq/L (ref 3.5–5.3)
Sodium: 140 mEq/L (ref 135–145)
Total Protein: 6.9 g/dL (ref 6.0–8.3)

## 2013-11-15 LAB — LIPID PANEL
CHOLESTEROL: 144 mg/dL (ref 0–200)
HDL: 36 mg/dL — ABNORMAL LOW (ref >39)
LDL CALC: 90 mg/dL (ref 0–99)
TRIGLYCERIDES: 91 mg/dL (ref <150)
Total CHOL/HDL Ratio: 4 Ratio
VLDL: 18 mg/dL (ref 0–40)

## 2013-11-15 MED ORDER — ATENOLOL-CHLORTHALIDONE 100-25 MG PO TABS: 1.0000 | ORAL_TABLET | Freq: Every day | ORAL | Status: DC

## 2013-11-15 MED ORDER — CLONIDINE HCL 0.1 MG PO TABS
0.1000 mg | ORAL_TABLET | Freq: Once | ORAL | Status: AC
  Administered 2013-11-15: 0.1 mg via ORAL

## 2013-11-15 MED ORDER — AMLODIPINE BESYLATE 10 MG PO TABS: 10.0000 mg | ORAL_TABLET | Freq: Every day | ORAL | Status: DC

## 2013-11-15 NOTE — Progress Notes (Deleted)
Patient ID: Christine Rocha, female   DOB: [birth year], 58 y.o.   MRN: [id number]   Christine Rocha, is a 58 y.o. female  JQZ:[id number]  [id number]:[id number]  DOB - [birth year]  CC:  Chief Complaint  Patient presents with  . Establish Care       HPI: Christine Rocha is a 58 y.o. female here today to establish medical care. Patient has No headache, No chest pain, No abdominal pain - No Nausea, No new weakness tingling or numbness, No Cough - SOB.  Allergies  Allergen Reactions  . Penicillins    Past Medical History  Diagnosis Date  . Seizures   . Hypertension    Current Outpatient Prescriptions on File Prior to Visit  Medication Sig Dispense Refill  . amLODipine (NORVASC) 10 MG tablet Take 1 tablet (10 mg total) by mouth daily. 30 tablet 5  . atenolol-chlorthalidone (TENORETIC 100) 100-25 MG per tablet Take 1 tablet by mouth daily. 30 tablet 5  . buPROPion (ZYBAN) 150 MG 12 hr tablet Take 1 tablet (150 mg total) by mouth 2 (two) times daily. 60 tablet 5  . fenofibrate (TRICOR) 145 MG tablet Take 1 tablet (145 mg total) by mouth daily. 30 tablet 5  . ibuprofen (ADVIL,MOTRIN) 200 MG tablet Take 1 tablet (200 mg total) by mouth every 6 (six) hours as needed for fever, headache or mild pain. 30 tablet 5  . nicotine (NICODERM CQ - DOSED IN MG/24 HOURS) 14 mg/24hr patch Place 1 patch (14 mg total) onto the skin daily. 28 patch 5  . potassium chloride (K-DUR) 10 MEQ tablet Take 1 tablet (10 mEq total) by mouth daily. 30 tablet 5   No current facility-administered medications on file prior to visit.   History reviewed. No pertinent family history. History   Social History  . Marital Status: Single    Spouse Name: N/A    Number of Children: N/A  . Years of Education: N/A   Occupational History  . Not on file.   Social History Main Topics  . Smoking status: Current Every Day Smoker -- 0.25 packs/day  . Smokeless tobacco: Never Used  . Alcohol Use: No  . Drug Use: No  . Sexual Activity: No     Other Topics Concern  . Not on file   Social History Narrative    Review of Systems: Constitutional: Negative for fever, chills, diaphoresis, activity change, appetite change and fatigue. HENT: Negative for ear pain, nosebleeds, congestion, facial swelling, rhinorrhea, neck pain, neck stiffness and ear discharge.  Eyes: Negative for pain, discharge, redness, itching and visual disturbance. Respiratory: Negative for cough, choking, chest tightness, shortness of breath, wheezing and stridor.  Cardiovascular: Negative for chest pain, palpitations and leg swelling. Gastrointestinal: Negative for abdominal distention. Genitourinary: Negative for dysuria, urgency, frequency, hematuria, flank pain, decreased urine volume, difficulty urinating and dyspareunia.  Musculoskeletal: Negative for back pain, joint swelling, arthralgia and gait problem. Neurological: Negative for dizziness, tremors, seizures, syncope, facial asymmetry, speech difficulty, weakness, light-headedness, numbness and headaches.  Hematological: Negative for adenopathy. Does not bruise/bleed easily. Psychiatric/Behavioral: Negative for hallucinations, behavioral problems, confusion, dysphoric mood, decreased concentration and agitation.    Objective:   Filed Vitals:   11/15/13 1019  BP: 173/104  Pulse: 81  Temp: 97.8 F (36.6 C)  Resp: 16    Physical Exam: Constitutional: Patient appears well-developed and well-nourished. No distress. HENT: Normocephalic, atraumatic, External right and left ear normal. Oropharynx is clear and moist.  Eyes: Conjunctivae and EOM are normal. PERRLA, no  scleral icterus. Neck: Normal ROM. Neck supple. No JVD. No tracheal deviation. No thyromegaly. CVS: RRR, S1/S2 +, no murmurs, no gallops, no carotid bruit.  Pulmonary: Effort and breath sounds normal, no stridor, rhonchi, wheezes, rales.  Abdominal: Soft. BS +, no distension, tenderness, rebound or guarding.  Musculoskeletal: Normal  range of motion. No edema and no tenderness.  Lymphadenopathy: No lymphadenopathy noted, cervical, inguinal or axillary Neuro: Alert. Normal reflexes, muscle tone coordination. No cranial nerve deficit. Skin: Skin is warm and dry. No rash noted. Not diaphoretic. No erythema. No pallor. Psychiatric: Normal mood and affect. Behavior, judgment, thought content normal.  Lab Results  Component Value Date   WBC 6.6 09/04/2012   HGB 13.1 09/04/2012   HCT 38.7 09/04/2012   MCV 87.4 09/04/2012   PLT 220 09/04/2012   Lab Results  Component Value Date   CREATININE 1.10 09/04/2012   BUN 16 09/04/2012   NA 144 09/04/2012   K 3.3* 09/04/2012   CL 104 09/04/2012   CO2 31 09/04/2012    Lab Results  Component Value Date   HGBA1C 6.3 09/13/2012   Lipid Panel     Component Value Date/Time   CHOL 160 11/20/2012 1012   TRIG 149 11/20/2012 1012   HDL 33* 11/20/2012 1012   CHOLHDL 4.8 11/20/2012 1012   VLDL 30 11/20/2012 1012   LDLCALC 97 11/20/2012 1012       Assessment and plan:   1. Essential hypertension *** - cloNIDine (CATAPRES) tablet 0.1 mg; Take 1 tablet (0.1 mg total) by mouth once.   No Follow-up on file.  The patient was given clear instructions to go to ER or return to medical center if symptoms don't improve, worsen or new problems develop. The patient verbalized understanding. The patient was told to call to get lab results if they haven't heard anything in the next week.     This note has been created with Surveyor, quantity. Any transcriptional errors are unintentional.    Lorrene Graef, Malachy Chamber, MD, [place], [place], [place], [place]   11/15/2013, 10:46 AM

## 2013-11-15 NOTE — Patient Instructions (Signed)
Hypertension Hypertension, commonly called high blood pressure, is when the force of blood pumping through your arteries is too strong. Your arteries are the blood vessels that carry blood from your heart throughout your body. A blood pressure reading consists of a higher number over a lower number, such as 110/72. The higher number (systolic) is the pressure inside your arteries when your heart pumps. The lower number (diastolic) is the pressure inside your arteries when your heart relaxes. Ideally you want your blood pressure below 120/80. Hypertension forces your heart to work harder to pump blood. Your arteries may become narrow or stiff. Having hypertension puts you at risk for heart disease, stroke, and other problems.  RISK FACTORS Some risk factors for high blood pressure are controllable. Others are not.  Risk factors you cannot control include:   Race. You may be at higher risk if you are African American.  Age. Risk increases with age.  Gender. Men are at higher risk than women before age 45 years. After age 65, women are at higher risk than men. Risk factors you can control include:  Not getting enough exercise or physical activity.  Being overweight.  Getting too much fat, sugar, calories, or salt in your diet.  Drinking too much alcohol. SIGNS AND SYMPTOMS Hypertension does not usually cause signs or symptoms. Extremely high blood pressure (hypertensive crisis) may cause headache, anxiety, shortness of breath, and nosebleed. DIAGNOSIS  To check if you have hypertension, your health care provider will measure your blood pressure while you are seated, with your arm held at the level of your heart. It should be measured at least twice using the same arm. Certain conditions can cause a difference in blood pressure between your right and left arms. A blood pressure reading that is higher than normal on one occasion does not mean that you need treatment. If one blood pressure reading  is high, ask your health care provider about having it checked again. TREATMENT  Treating high blood pressure includes making lifestyle changes and possibly taking medicine. Living a healthy lifestyle can help lower high blood pressure. You may need to change some of your habits. Lifestyle changes may include:  Following the DASH diet. This diet is high in fruits, vegetables, and whole grains. It is low in salt, red meat, and added sugars.  Getting at least 2 hours of brisk physical activity every week.  Losing weight if necessary.  Not smoking.  Limiting alcoholic beverages.  Learning ways to reduce stress. If lifestyle changes are not enough to get your blood pressure under control, your health care provider may prescribe medicine. You may need to take more than one. Work closely with your health care provider to understand the risks and benefits. HOME CARE INSTRUCTIONS  Have your blood pressure rechecked as directed by your health care provider.   Take medicines only as directed by your health care provider. Follow the directions carefully. Blood pressure medicines must be taken as prescribed. The medicine does not work as well when you skip doses. Skipping doses also puts you at risk for problems.   Do not smoke.   Monitor your blood pressure at home as directed by your health care provider. SEEK MEDICAL CARE IF:   You think you are having a reaction to medicines taken.  You have recurrent headaches or feel dizzy.  You have swelling in your ankles.  You have trouble with your vision. SEEK IMMEDIATE MEDICAL CARE IF:  You develop a severe headache or confusion.    You have unusual weakness, numbness, or feel faint.  You have severe chest or abdominal pain.  You vomit repeatedly.  You have trouble breathing. MAKE SURE YOU:   Understand these instructions.  Will watch your condition.  Will get help right away if you are not doing well or get worse. Document  Released: 12/28/2004 Document Revised: 05/14/2013 Document Reviewed: 10/20/2012 [facility] Patient Information 2015 [place], [place]. This information is not intended to replace advice given to you by your health care provider. Make sure you discuss any questions you have with your health care provider. Heart Failure Heart failure is a condition in which the heart has trouble pumping blood. This means your heart does not pump blood efficiently for your body to work well. In some cases of heart failure, fluid may back up into your lungs or you may have swelling (edema) in your lower legs. Heart failure is usually a long-term (chronic) condition. It is important for you to take good care of yourself and follow your health care provider's treatment plan. CAUSES  Some health conditions can cause heart failure. Those health conditions include:  High blood pressure (hypertension). Hypertension causes the heart muscle to work harder than normal. When pressure in the blood vessels is high, the heart needs to pump (contract) with more force in order to circulate blood throughout the body. High blood pressure eventually causes the heart to become stiff and weak.  Coronary artery disease (CAD). CAD is the buildup of cholesterol and fat (plaque) in the arteries of the heart. The blockage in the arteries deprives the heart muscle of oxygen and blood. This can cause chest pain and may lead to a heart attack. High blood pressure can also contribute to CAD.  Heart attack (myocardial infarction). A heart attack occurs when one or more arteries in the heart become blocked. The loss of oxygen damages the muscle tissue of the heart. When this happens, part of the heart muscle dies. The injured tissue does not contract as well and weakens the heart's ability to pump blood.  Abnormal heart valves. When the heart valves do not open and close properly, it can cause heart failure. This makes the heart muscle pump harder to keep  the blood flowing.  Heart muscle disease (cardiomyopathy or myocarditis). Heart muscle disease is damage to the heart muscle from a variety of causes. These can include drug or alcohol abuse, infections, or unknown reasons. These can increase the risk of heart failure.  Lung disease. Lung disease makes the heart work harder because the lungs do not work properly. This can cause a strain on the heart, leading it to fail.  Diabetes. Diabetes increases the risk of heart failure. High blood sugar contributes to high fat (lipid) levels in the blood. Diabetes can also cause slow damage to tiny blood vessels that carry important nutrients to the heart muscle. When the heart does not get enough oxygen and food, it can cause the heart to become weak and stiff. This leads to a heart that does not contract efficiently.  Other conditions can contribute to heart failure. These include abnormal heart rhythms, thyroid problems, and low blood counts (anemia). Certain unhealthy behaviors can increase the risk of heart failure, including:  Being overweight.  Smoking or chewing tobacco.  Eating foods high in fat and cholesterol.  Abusing illicit drugs or alcohol.  Lacking physical activity. SYMPTOMS  Heart failure symptoms may vary and can be hard to detect. Symptoms may include:  Shortness of breath with activity,  such as climbing stairs.  Persistent cough.  Swelling of the feet, ankles, legs, or abdomen.  Unexplained weight gain.  Difficulty breathing when lying flat (orthopnea).  Waking from sleep because of the need to sit up and get more air.  Rapid heartbeat.  Fatigue and loss of energy.  Feeling light-headed, dizzy, or close to fainting.  Loss of appetite.  Nausea.  Increased urination during the night (nocturia). DIAGNOSIS  A diagnosis of heart failure is based on your history, symptoms, physical examination, and diagnostic tests. Diagnostic tests for heart failure may  include:  Echocardiography.  Electrocardiography.  Chest X-ray.  Blood tests.  Exercise stress test.  Cardiac angiography.  Radionuclide scans. TREATMENT  Treatment is aimed at managing the symptoms of heart failure. Medicines, behavioral changes, or surgical intervention may be necessary to treat heart failure.  Medicines to help treat heart failure may include:  Angiotensin-converting enzyme (ACE) inhibitors. This type of medicine blocks the effects of a blood protein called angiotensin-converting enzyme. ACE inhibitors relax (dilate) the blood vessels and help lower blood pressure.  Angiotensin receptor blockers (ARBs). This type of medicine blocks the actions of a blood protein called angiotensin. Angiotensin receptor blockers dilate the blood vessels and help lower blood pressure.  Water pills (diuretics). Diuretics cause the kidneys to remove salt and water from the blood. The extra fluid is removed through urination. This loss of extra fluid lowers the volume of blood the heart pumps.  Beta blockers. These prevent the heart from beating too fast and improve heart muscle strength.  Digitalis. This increases the force of the heartbeat.  Healthy behavior changes include:  Obtaining and maintaining a healthy weight.  Stopping smoking or chewing tobacco.  Eating heart-healthy foods.  Limiting or avoiding alcohol.  Stopping illicit drug use.  Physical activity as directed by your health care provider.  Surgical treatment for heart failure may include:  A procedure to open blocked arteries, repair damaged heart valves, or remove damaged heart muscle tissue.  A pacemaker to improve heart muscle function and control certain abnormal heart rhythms.  An internal cardioverter defibrillator to treat certain serious abnormal heart rhythms.  A left ventricular assist device (LVAD) to assist the pumping ability of the heart. HOME CARE INSTRUCTIONS   Take medicines only  as directed by your health care provider. Medicines are important in reducing the workload of your heart, slowing the progression of heart failure, and improving your symptoms.  Do not stop taking your medicine unless directed by your health care provider.  Do not skip any dose of medicine.  Refill your prescriptions before you run out of medicine. Your medicines are needed every day.  Engage in moderate physical activity if directed by your health care provider. Moderate physical activity can benefit some people. The elderly and people with severe heart failure should consult with a health care provider for physical activity recommendations.  Eat heart-healthy foods. Food choices should be free of trans fat and low in saturated fat, cholesterol, and salt (sodium). Healthy choices include fresh or frozen fruits and vegetables, fish, lean meats, legumes, fat-free or low-fat dairy products, and whole grain or high fiber foods. Talk to a dietitian to learn more about heart-healthy foods.  Limit sodium if directed by your health care provider. Sodium restriction may reduce symptoms of heart failure in some people. Talk to a dietitian to learn more about heart-healthy seasonings.  Use healthy cooking methods. Healthy cooking methods include roasting, grilling, broiling, baking, poaching, steaming, or stir-frying. Talk  to a dietitian to learn more about healthy cooking methods.  Limit fluids if directed by your health care provider. Fluid restriction may reduce symptoms of heart failure in some people.  Weigh yourself every day. Daily weights are important in the early recognition of excess fluid. You should weigh yourself every morning after you urinate and before you eat breakfast. Wear the same amount of clothing each time you weigh yourself. Record your daily weight. Provide your health care provider with your weight record.  Monitor and record your blood pressure if directed by your health care  provider.  Check your pulse if directed by your health care provider.  Lose weight if directed by your health care provider. Weight loss may reduce symptoms of heart failure in some people.  Stop smoking or chewing tobacco. Nicotine makes your heart work harder by causing your blood vessels to constrict. Do not use nicotine gum or patches before talking to your health care provider.  Keep all follow-up visits as directed by your health care provider. This is important.  Limit alcohol intake to no more than 1 drink per day for nonpregnant women and 2 drinks per day for men. One drink equals 12 ounces of beer, 5 ounces of wine, or 1 ounces of hard liquor. Drinking more than that is harmful to your heart. Tell your health care provider if you drink alcohol several times a week. Talk with your health care provider about whether alcohol is safe for you. If your heart has already been damaged by alcohol or you have severe heart failure, drinking alcohol should be stopped completely.  Stop illicit drug use.  Stay up-to-date with immunizations. It is especially important to prevent respiratory infections through current pneumococcal and influenza immunizations.  Manage other health conditions such as hypertension, diabetes, thyroid disease, or abnormal heart rhythms as directed by your health care provider.  Learn to manage stress.  Plan rest periods when fatigued.  Learn strategies to manage high temperatures. If the weather is extremely hot:  Avoid vigorous physical activity.  Use air conditioning or fans or seek a cooler location.  Avoid caffeine and alcohol.  Wear loose-fitting, lightweight, and light-colored clothing.  Learn strategies to manage cold temperatures. If the weather is extremely cold:  Avoid vigorous physical activity.  Layer clothes.  Wear mittens or gloves, a hat, and a scarf when going outside.  Avoid alcohol.  Obtain ongoing education and support as  needed.  Participate in or seek rehabilitation as needed to maintain or improve independence and quality of life. SEEK MEDICAL CARE IF:   Your weight increases by 03 lb/1.4 kg in 1 day or 05 lb/2.3 kg in a week.  You have increasing shortness of breath that is unusual for you.  You are unable to participate in your usual physical activities.  You tire easily.  You cough more than normal, especially with physical activity.  You have any or more swelling in areas such as your hands, feet, ankles, or abdomen.  You are unable to sleep because it is hard to breathe.  You feel like your heart is beating fast (palpitations).  You become dizzy or light-headed upon standing up. SEEK IMMEDIATE MEDICAL CARE IF:   You have difficulty breathing.  There is a change in mental status such as decreased alertness or difficulty with concentration.  You have a pain or discomfort in your chest.  You have an episode of fainting (syncope). MAKE SURE YOU:   Understand these instructions.  Will watch  your condition.  Will get help right away if you are not doing well or get worse. Document Released: 12/28/2004 Document Revised: 05/14/2013 Document Reviewed: 01/28/2012 [facility] Patient Information 2015 [place], [place]. This information is not intended to replace advice given to you by your health care provider. Make sure you discuss any questions you have with your health care provider.

## 2013-11-15 NOTE — Progress Notes (Signed)
Patient ID: Christine Rocha, female   DOB: 09/21/1955, 58 y.o.   MRN: [id number]   Christine Rocha, is a 58 y.o. female  [id number]  [id number]  DOB - 05/14/1955  Chief Complaint  Patient presents with  . Establish Care        Subjective:   Christine Rocha is a 58 y.o. female here today for a follow up visit. PMH significant for HTN.  She also currently smokes 1.5 ppd.  She reports having been out of her medications for at least 1 year.  She was seen by [facility] nursing and was referred back to the clinic for assessment.  She complains of SOB with walking, which she attributes to her smoking.  The SOB resolves with rest.  It is not associated with any chest pain, dizziness, N/V.   She has no other complaints.  She would like to restart all of her previously prescribed medications.  She is also requesting her influenza vaccination. She is up to date on her pap smear, is due for a mammogram in Dec and declines colonoscopy at this time.    No problems updated.  ALLERGIES: Allergies  Allergen Reactions  . Penicillins     PAST MEDICAL HISTORY: Past Medical History  Diagnosis Date  . Seizures   . Hypertension     MEDICATIONS AT HOME: Prior to Admission medications   Medication Sig Start Date End Date Taking? Authorizing Provider  amLODipine (NORVASC) 10 MG tablet Take 1 tablet (10 mg total) by mouth daily. 11/20/12   Robbie Lis, MD  atenolol-chlorthalidone (TENORETIC 100) 100-25 MG per tablet Take 1 tablet by mouth daily. 11/20/12   Robbie Lis, MD  buPROPion (ZYBAN) 150 MG 12 hr tablet Take 1 tablet (150 mg total) by mouth 2 (two) times daily. 11/20/12   Robbie Lis, MD  fenofibrate (TRICOR) 145 MG tablet Take 1 tablet (145 mg total) by mouth daily. 11/20/12   Robbie Lis, MD  ibuprofen (ADVIL,MOTRIN) 200 MG tablet Take 1 tablet (200 mg total) by mouth every 6 (six) hours as needed for fever, headache or mild pain. 11/20/12   Robbie Lis, MD  nicotine (NICODERM CQ - DOSED IN  MG/24 HOURS) 14 mg/24hr patch Place 1 patch (14 mg total) onto the skin daily. 11/20/12   Robbie Lis, MD  potassium chloride (K-DUR) 10 MEQ tablet Take 1 tablet (10 mEq total) by mouth daily. 11/20/12   Robbie Lis, MD   Review of Systems  Constitutional: Negative.   HENT: Negative.   Eyes: Negative.   Respiratory: Positive for shortness of breath. Negative for cough, hemoptysis, sputum production and wheezing.   Cardiovascular: Negative.   Gastrointestinal: Negative.   Genitourinary: Negative.   Musculoskeletal: Negative.   Skin: Negative.   Neurological: Negative.   Psychiatric/Behavioral: Negative.      Objective:   Filed Vitals:   11/15/13 1019  BP: 173/104  Pulse: 81  Temp: 97.8 F (36.6 C)  TempSrc: Oral  Resp: 16  Height: 5\' 2"  (1.575 m)  Weight: 184 lb (83.462 kg)  SpO2: 97%    Exam General appearance : Awake, alert, not in any distress. Speech Clear. Not toxic looking HEENT: Atraumatic and Normocephalic, pupils equally reactive to light and accomodation Neck: supple, no JVD. No cervical lymphadenopathy.  Chest:Good air entry bilaterally, no added sounds  CVS: S1 S2 regular, no murmurs.  Abdomen: Bowel sounds present, Non tender and not distended with no gaurding, rigidity or rebound. Extremities: B/L  Lower Ext shows no edema, both legs are warm to touch Neurology: Awake alert, and oriented X 3, CN II-XII intact, Non focal Skin:No Rash Wounds:N/A  Data Review Lab Results  Component Value Date   HGBA1C 6.3 09/13/2012     Assessment & Plan   1. Essential hypertension P: - cloNIDine (CATAPRES) tablet 0.1 mg; Take 1 tablet (0.1 mg total) by mouth once. Restart amlodipine 10 mg daily, tenoretic 100-25 mg daily. - CBC with Differential - COMPLETE METABOLIC PANEL WITH GFR - POCT glycosylated hemoglobin (Hb A1C) - Lipid panel - Urinalysis, Complete  - amLODipine (NORVASC) 10 MG tablet; Take 1 tablet (10 mg total) by mouth daily.  Dispense: 90  tablet; Refill: 3 - atenolol-chlorthalidone (TENORETIC 100) 100-25 MG per tablet; Take 1 tablet by mouth daily.  Dispense: 90 tablet; Refill: 3  2. Pre-diabetes P: A1C  Aim for 2-3 Carb Choices per meal (30-45 grams) +/- 1 either way  Aim for 0-15 Carbs per snack if hungry  Include protein in moderation with your meals and snacks  Consider reading food labels for Total Carbohydrate and Fat Grams of foods  Consider checking BG at alternate times per day  Continue taking medication as directed Fruit Punch - find one with no sugar  Measure and decrease portions of carbohydrate foods  Make your plate and don't go back for seconds  3. Elevated triglycerides  Lipid panel  To address this please limit saturated fat to no more than 7% of your calories, limit cholesterol to 200 mg/day, increase fiber and exercise as tolerated. If needed we may add another cholesterol lowering medication to your regimen.   3. Smoking cessation  Counseled extensively about need to stop smoking. She is not ready at this time.  She will think about setting a quit date and will call if materials are needed.   2 Preventative healthcare  Influenza vaccination, referral for mammogram   The patient was given clear instructions to go to ER or return to medical center if symptoms don't improve, worsen or new problems develop. The patient verbalized understanding. The patient was told to call to get lab results if they haven't heard anything in the next week.   This note has been created with Surveyor, quantity. Any transcriptional errors are unintentional.    Aislynn Cifelli, FNP-student  Evaluation and management procedures were performed by the Advanced Practitioner under my supervision and collaboration. I have reviewed the Advanced Practitioner's note and chart, and I agree with the management and plan.   Angelica Chessman, MD, [place], [place], [place] and [place], [place]   11/15/2013, 10:47 AM

## 2013-11-15 NOTE — Progress Notes (Signed)
Pt is here to establish care. Pt reports being out of her medications for 2 years. Pt has questions about her BP medications.

## 2013-11-16 LAB — URINALYSIS, COMPLETE
BILIRUBIN URINE: NEGATIVE
Bacteria, UA: NONE SEEN
CASTS: NONE SEEN
CRYSTALS: NONE SEEN
GLUCOSE, UA: NEGATIVE mg/dL
Hgb urine dipstick: NEGATIVE
KETONES UR: NEGATIVE mg/dL
NITRITE: NEGATIVE
PH: 6.5 (ref 5.0–8.0)
Protein, ur: NEGATIVE mg/dL
SPECIFIC GRAVITY, URINE: 1.008 (ref 1.005–1.030)
Urobilinogen, UA: 0.2 mg/dL (ref 0.0–1.0)

## 2013-11-21 ENCOUNTER — Telehealth: Payer: Self-pay | Admitting: Emergency Medicine

## 2013-11-21 NOTE — Telephone Encounter (Signed)
-----   Message from Tresa Garter, MD sent at 11/18/2013  7:29 PM EST ----- Please inform patient that her laboratory test results are mostly within normal limits.

## 2013-11-21 NOTE — Telephone Encounter (Signed)
Left message for pt to call for lab results 

## 2014-10-14 ENCOUNTER — Telehealth: Payer: Self-pay | Admitting: Internal Medicine

## 2014-10-14 NOTE — Telephone Encounter (Signed)
Patient called and requested a med refill for all of her current medications. Please f/u with pt.

## 2014-10-16 NOTE — Telephone Encounter (Signed)
Patient's telephone number is invalid. Other contact is Work. Medical Assistant unable to reach patient in regards to refilling medication.

## 2014-10-28 ENCOUNTER — Ambulatory Visit: Payer: No Typology Code available for payment source | Attending: Internal Medicine | Admitting: Internal Medicine

## 2014-10-28 ENCOUNTER — Other Ambulatory Visit: Payer: Self-pay | Admitting: Internal Medicine

## 2014-10-28 ENCOUNTER — Encounter: Payer: Self-pay | Admitting: Internal Medicine

## 2014-10-28 VITALS — BP 123/76 | HR 65 | Temp 98.1°F | Resp 18 | Ht 62.0 in | Wt 191.2 lb

## 2014-10-28 DIAGNOSIS — F17200 Nicotine dependence, unspecified, uncomplicated: Secondary | ICD-10-CM | POA: Diagnosis not present

## 2014-10-28 DIAGNOSIS — Z23 Encounter for immunization: Secondary | ICD-10-CM | POA: Diagnosis not present

## 2014-10-28 DIAGNOSIS — M1712 Unilateral primary osteoarthritis, left knee: Secondary | ICD-10-CM

## 2014-10-28 DIAGNOSIS — Z0000 Encounter for general adult medical examination without abnormal findings: Secondary | ICD-10-CM

## 2014-10-28 DIAGNOSIS — R7303 Prediabetes: Secondary | ICD-10-CM | POA: Diagnosis not present

## 2014-10-28 DIAGNOSIS — Z79899 Other long term (current) drug therapy: Secondary | ICD-10-CM | POA: Diagnosis not present

## 2014-10-28 DIAGNOSIS — F17210 Nicotine dependence, cigarettes, uncomplicated: Secondary | ICD-10-CM | POA: Insufficient documentation

## 2014-10-28 DIAGNOSIS — Z1231 Encounter for screening mammogram for malignant neoplasm of breast: Secondary | ICD-10-CM | POA: Insufficient documentation

## 2014-10-28 DIAGNOSIS — I10 Essential (primary) hypertension: Secondary | ICD-10-CM | POA: Diagnosis not present

## 2014-10-28 LAB — CBC WITH DIFFERENTIAL/PLATELET
Basophils Absolute: 0.1 10*3/uL (ref 0.0–0.1)
Basophils Relative: 1 % (ref 0–1)
EOS ABS: 0.1 10*3/uL (ref 0.0–0.7)
Eosinophils Relative: 2 % (ref 0–5)
HEMATOCRIT: 40.4 % (ref 36.0–46.0)
Hemoglobin: 13.4 g/dL (ref 12.0–15.0)
Lymphocytes Relative: 43 % (ref 12–46)
Lymphs Abs: 2.2 10*3/uL (ref 0.7–4.0)
MCH: 29.5 pg (ref 26.0–34.0)
MCHC: 33.2 g/dL (ref 30.0–36.0)
MCV: 88.8 fL (ref 78.0–100.0)
MONOS PCT: 8 % (ref 3–12)
MPV: 10.5 fL (ref 8.6–12.4)
Monocytes Absolute: 0.4 10*3/uL (ref 0.1–1.0)
NEUTROS ABS: 2.3 10*3/uL (ref 1.7–7.7)
Neutrophils Relative %: 46 % (ref 43–77)
Platelets: 323 10*3/uL (ref 150–400)
RBC: 4.55 MIL/uL (ref 3.87–5.11)
RDW: 14.1 % (ref 11.5–15.5)
WBC: 5.1 10*3/uL (ref 4.0–10.5)

## 2014-10-28 LAB — TSH: TSH: 1.003 u[IU]/mL (ref 0.350–4.500)

## 2014-10-28 MED ORDER — NAPROXEN 500 MG PO TABS: 500.0000 mg | ORAL_TABLET | Freq: Two times a day (BID) | ORAL | Status: DC

## 2014-10-28 MED ORDER — ATENOLOL-CHLORTHALIDONE 100-25 MG PO TABS
1.0000 | ORAL_TABLET | Freq: Every day | ORAL | Status: DC
Start: 2014-10-28 — End: 2016-02-13

## 2014-10-28 MED ORDER — AMLODIPINE BESYLATE 10 MG PO TABS: 10.0000 mg | ORAL_TABLET | Freq: Every day | ORAL | Status: DC

## 2014-10-28 NOTE — Progress Notes (Signed)
Patient complains of pain scaled at a 10, in left knee.

## 2014-10-28 NOTE — Progress Notes (Signed)
Patient ID: Christine Rocha, female   DOB: 04/28/1955, 59 y.o.   MRN: [id number]    [name], is a 59 y.o. female  GGE:[id number]  [id number]  DOB - [birth year]  CC:  Chief Complaint  Patient presents with  . Follow-up       HPI: Christine Rocha is a 59 y.o. female here today to for medication refills and check up. PMH significant for HTN, Smoking addiction, and Prediabetes. She also currently smokes 2 ppd for last 30 years for 60 year pack history.   Patient reports she is taking her BP medications as prescribed. Patient has complaint of left knee pain, that is worse with movement, and rated as sharp and 10 of 10 on pain scale. Patient was seen in the ED on 08/31/12 for complaints of left knee pain no Xrays taken at that time. Patient has Xray from 08/04/13 of left tibia/fibula which notes mild degenerative changes in left knee. Patient states she has taken nothing for pain and that rest and elevation makes pain better.   She has no other complaints. She would like to refills on current medications. She is also requesting her influenza vaccination. Patient has No headache, No chest pain, No abdominal pain - No Nausea, No new weakness tingling or numbness, No Cough - SOB.  Allergies  Allergen Reactions  . Penicillins    Past Medical History  Diagnosis Date  . Seizures ([place])   . Hypertension    Current Outpatient Prescriptions on File Prior to Visit  Medication Sig Dispense Refill  . amLODipine (NORVASC) 10 MG tablet Take 1 tablet (10 mg total) by mouth daily. 90 tablet 3  . atenolol-chlorthalidone (TENORETIC 100) 100-25 MG per tablet Take 1 tablet by mouth daily. 90 tablet 3  . buPROPion (ZYBAN) 150 MG 12 hr tablet Take 1 tablet (150 mg total) by mouth 2 (two) times daily. (Patient not taking: Reported on 10/28/2014) 60 tablet 5  . fenofibrate (TRICOR) 145 MG tablet Take 1 tablet (145 mg total) by mouth daily. (Patient not taking: Reported on 10/28/2014) 30 tablet 5  . ibuprofen  (ADVIL,MOTRIN) 200 MG tablet Take 1 tablet (200 mg total) by mouth every 6 (six) hours as needed for fever, headache or mild pain. (Patient not taking: Reported on 10/28/2014) 30 tablet 5  . nicotine (NICODERM CQ - DOSED IN MG/24 HOURS) 14 mg/24hr patch Place 1 patch (14 mg total) onto the skin daily. (Patient not taking: Reported on 10/28/2014) 28 patch 5  . potassium chloride (K-DUR) 10 MEQ tablet Take 1 tablet (10 mEq total) by mouth daily. (Patient not taking: Reported on 10/28/2014) 30 tablet 5   No current facility-administered medications on file prior to visit.   History reviewed. No pertinent family history. Social History   Social History  . Marital Status: Single    Spouse Name: N/A  . Number of Children: N/A  . Years of Education: N/A   Occupational History  . Not on file.   Social History Main Topics  . Smoking status: Current Every Day Smoker -- 0.25 packs/day  . Smokeless tobacco: Never Used  . Alcohol Use: No  . Drug Use: No  . Sexual Activity: No   Other Topics Concern  . Not on file   Social History Narrative   Review of Systems  Constitutional: Negative.   HENT: Negative.   Eyes: Negative.   Respiratory: Negative.   Gastrointestinal: Negative.   Genitourinary: Negative.   Musculoskeletal: Positive for joint pain.  Left knee  Skin: Negative.   Neurological: Negative.   Endo/Heme/Allergies: Negative.   Psychiatric/Behavioral: Negative.       Objective:   Filed Vitals:   10/28/14 0951  BP: 123/76  Pulse: 65  Temp: 98.1 F (36.7 C)  Resp: 18      Lab Results  Component Value Date   WBC 4.0 11/15/2013   HGB 13.1 11/15/2013   HCT 40.8 11/15/2013   MCV 89.7 11/15/2013   PLT 287 11/15/2013   Lab Results  Component Value Date   CREATININE 0.79 11/15/2013   BUN 9 11/15/2013   NA 140 11/15/2013   K 4.0 11/15/2013   CL 104 11/15/2013   CO2 28 11/15/2013    Lab Results  Component Value Date   HGBA1C 6.3 09/13/2012   Lipid  Panel     Component Value Date/Time   CHOL 144 11/15/2013 1112   TRIG 91 11/15/2013 1112   HDL 36* 11/15/2013 1112   CHOLHDL 4.0 11/15/2013 1112   VLDL 18 11/15/2013 1112   LDLCALC 90 11/15/2013 1112   Physical Exam  Constitutional: She is oriented to person, place, and time. She appears well-developed and well-nourished.  HENT:  Head: Normocephalic and atraumatic.  Right Ear: External ear normal.  Left Ear: External ear normal.  Nose: Nose normal.  Mouth/Throat: Oropharynx is clear and moist.  Eyes: Conjunctivae and EOM are normal. Pupils are equal, round, and reactive to light.  Neck: Normal range of motion. Neck supple.  Cardiovascular: Normal rate, regular rhythm, normal heart sounds and intact distal pulses.  Exam reveals no friction rub.   No murmur heard. Pulmonary/Chest: Effort normal and breath sounds normal.  Abdominal: Soft. Bowel sounds are normal.  android obese  Genitourinary:  deferred  Musculoskeletal: Normal range of motion.       Left knee: She exhibits swelling. Tenderness found.  Neurological: She is alert and oriented to person, place, and time. She has normal reflexes.  Skin: Skin is warm and dry.  Psychiatric: She has a normal mood and affect. Her behavior is normal. Judgment and thought content normal.  Nursing note and vitals reviewed.     Assessment and plan:   Yeily was seen today for follow-up.  Diagnoses and all orders for this visit:  Essential hypertension -     CBC with Differential/Platelet -     COMPLETE METABOLIC PANEL WITH GFR -     TSH -     Urinalysis, Complete -     amLODipine (NORVASC) 10 MG tablet; Take 1 tablet (10 mg total) by mouth daily. -     atenolol-chlorthalidone (TENORETIC 100) 100-25 MG tablet; Take 1 tablet by mouth daily.  We have discussed target BP and blood pressure goals have advised patient to check BP regularly and to call us back or report to clinic if the numbers are consistently higher than 140/90.  We  discussed the importance of medication compliance and the DASH diet. Patient was encouraged to walk 3 to 5 times per week for 30 minutes.   Prediabetes -     POCT glycosylated hemoglobin (Hb A1C) -     Lipid panel  Aim for 30 minutes of exercise most days. Rethink what you drink. Water is great! Aim for 2-3 Carb Choices per meal (30-45 grams) +/- 1 either way  Aim for 0-15 Carbs per snack if hungry  Include protein in moderation with your meals and snacks  Consider reading food labels for Total Carbohydrate and Fat Grams of  foods  Be mindful about how much sugar you are adding to beverages and other foods. Fruit Punch - find one with no sugar  Measure and decrease portions of carbohydrate foods  Make your plate and don't go back for seconds   Smoking addiction  Patient set a goal of December 2016 to stop smoking. Smoking cessation information was shares with the patient and Chantix was discussed. Patient wants to wait at this time.  Haya was counseled on the dangers of tobacco use, and was advised to quit. Reviewed strategies to maximize success, including removing cigarettes and smoking materials from environment, stress management and support of family/friends.  Preventative health care -     Ambulatory referral to Gastroenterology -     Flu Vaccine QUAD 36+ mos PF IM (Fluarix & Fluzone Quad PF) Referral for Colonoscopy made.  Encounter for screening mammogram for breast cancer -     MM Digital Diagnostic Bilat; Future        Referral for mammogram made  Primary osteoarthritis of left knee -     naproxen (NAPROSYN) 500 MG tablet; Take 1 tablet (500 mg total) by mouth 2 (two) times daily with a meal.   Return in about 6 months (around 04/28/2015) for Follow up HTN, Pap Smear.  The patient was given clear instructions to go to ER or return to medical center if symptoms don't improve, worsen or new problems develop. The patient verbalized understanding. The patient was told to  call to get lab results if they haven't heard anything in the next week.     Clois Dupes, RN BSN [facility] [phone] 10/28/2014, 10:03 AM   Evaluation and management procedures were performed by the Advanced Practitioner student under my supervision and collaboration. I have reviewed the Advanced Practitioner's note and chart, and I agree with the management and plan.   Angelica Chessman, MD, [place], [place], [place], [place] and [place], [place]   10/28/2014, 5:13 PM

## 2014-10-28 NOTE — Patient Instructions (Signed)
Osteoarthritis Osteoarthritis is a disease that causes soreness and inflammation of a joint. It occurs when the cartilage at the affected joint wears down. Cartilage acts as a cushion, covering the ends of bones where they meet to form a joint. Osteoarthritis is the most common form of arthritis. It often occurs in older people. The joints affected most often by this condition include those in the:  Ends of the fingers.  Thumbs.  Neck.  Lower back.  Knees.  Hips. CAUSES  Over time, the cartilage that covers the ends of bones begins to wear away. This causes bone to rub on bone, producing pain and stiffness in the affected joints.  RISK FACTORS Certain factors can increase your chances of having osteoarthritis, including:  Older age.  Excessive body weight.  Overuse of joints.  Previous joint injury. SIGNS AND SYMPTOMS   Pain, swelling, and stiffness in the joint.  Over time, the joint may lose its normal shape.  Small deposits of bone (osteophytes) may grow on the edges of the joint.  Bits of bone or cartilage can break off and float inside the joint space. This may cause more pain and damage. DIAGNOSIS  Your health care provider will do a physical exam and ask about your symptoms. Various tests may be ordered, such as:  X-rays of the affected joint.  Blood tests to rule out other types of arthritis. Additional tests may be used to diagnose your condition. TREATMENT  Goals of treatment are to control pain and improve joint function. Treatment plans may include:  A prescribed exercise program that allows for rest and joint relief.  A weight control plan.  Pain relief techniques, such as:  Properly applied heat and cold.  Electric pulses delivered to nerve endings under the skin (transcutaneous electrical nerve stimulation [TENS]).  Massage.  Certain nutritional supplements.  Medicines to control pain, such as:  Acetaminophen.  Nonsteroidal  anti-inflammatory drugs (NSAIDs), such as naproxen.  Narcotic or central-acting agents, such as tramadol.  Corticosteroids. These can be given orally or as an injection.  Surgery to reposition the bones and relieve pain (osteotomy) or to remove loose pieces of bone and cartilage. Joint replacement may be needed in advanced states of osteoarthritis. HOME CARE INSTRUCTIONS   Take medicines only as directed by your health care provider.  Maintain a healthy weight. Follow your health care provider's instructions for weight control. This may include dietary instructions.  Exercise as directed. Your health care provider can recommend specific types of exercise. These may include:  Strengthening exercises. These are done to strengthen the muscles that support joints affected by arthritis. They can be performed with weights or with exercise bands to add resistance.  Aerobic activities. These are exercises, such as brisk walking or low-impact aerobics, that get your heart pumping.  Range-of-motion activities. These keep your joints limber.  Balance and agility exercises. These help you maintain daily living skills.  Rest your affected joints as directed by your health care provider.  Keep all follow-up visits as directed by your health care provider. SEEK MEDICAL CARE IF:   Your skin turns red.  You develop a rash in addition to your joint pain.  You have worsening joint pain.  You have a fever along with joint or muscle aches. SEEK IMMEDIATE MEDICAL CARE IF:  You have a significant loss of weight or appetite.  You have night sweats. FOR MORE INFORMATION   National Institute of Arthritis and Musculoskeletal and Skin Diseases: www.niams.nih.gov  National Institute on   Aging: http://kim-miller.com/  American College of Rheumatology: www.rheumatology.org   This information is not intended to replace advice given to you by your health care provider. Make sure you discuss any questions you  have with your health care provider.   Document Released: 12/28/2004 Document Revised: 01/18/2014 Document Reviewed: 09/04/2012 Elsevier Interactive Patient Education 2016 [place] DASH stands for "Dietary Approaches to Stop Hypertension." The DASH eating plan is a healthy eating plan that has been shown to reduce high blood pressure (hypertension). Additional health benefits may include reducing the risk of type 2 diabetes mellitus, heart disease, and stroke. The DASH eating plan may also help with weight loss. WHAT DO I NEED TO KNOW ABOUT THE DASH EATING PLAN? For the DASH eating plan, you will follow these general guidelines:  Choose foods with a percent daily value for sodium of less than 5% (as listed on the food label).  Use salt-free seasonings or herbs instead of table salt or sea salt.  Check with your health care provider or pharmacist before using salt substitutes.  Eat lower-sodium products, often labeled as "lower sodium" or "no salt added."  Eat fresh foods.  Eat more vegetables, fruits, and low-fat dairy products.  Choose whole grains. Look for the word "whole" as the first word in the ingredient list.  Choose fish and skinless chicken or [place] more often than red meat. Limit fish, poultry, and meat to 6 oz (170 g) each day.  Limit sweets, desserts, sugars, and sugary drinks.  Choose heart-healthy fats.  Limit cheese to 1 oz (28 g) per day.  Eat more home-cooked food and less restaurant, buffet, and fast food.  Limit fried foods.  Cook foods using methods other than frying.  Limit canned vegetables. If you do use them, rinse them well to decrease the sodium.  When eating at a restaurant, ask that your food be prepared with less salt, or no salt if possible. WHAT FOODS CAN I EAT? Seek help from a dietitian for individual calorie needs. Grains Whole grain or whole wheat bread. Brown rice. Whole grain or whole wheat pasta. Quinoa, bulgur,  and whole grain cereals. Low-sodium cereals. Corn or whole wheat flour tortillas. Whole grain cornbread. Whole grain crackers. Low-sodium crackers. Vegetables Fresh or frozen vegetables (raw, steamed, roasted, or grilled). Low-sodium or reduced-sodium tomato and vegetable juices. Low-sodium or reduced-sodium tomato sauce and paste. Low-sodium or reduced-sodium canned vegetables.  Fruits All fresh, canned (in natural juice), or frozen fruits. Meat and Other Protein Products Ground beef (85% or leaner), grass-fed beef, or beef trimmed of fat. Skinless chicken or [place]. Ground chicken or [place]. Pork trimmed of fat. All fish and seafood. Eggs. Dried beans, peas, or lentils. Unsalted nuts and seeds. Unsalted canned beans. Dairy Low-fat dairy products, such as skim or 1% milk, 2% or reduced-fat cheeses, low-fat ricotta or cottage cheese, or plain low-fat yogurt. Low-sodium or reduced-sodium cheeses. Fats and Oils Tub margarines without trans fats. Light or reduced-fat mayonnaise and salad dressings (reduced sodium). Avocado. Safflower, olive, or canola oils. Natural peanut or almond butter. Other Unsalted popcorn and pretzels. The items listed above may not be a complete list of recommended foods or beverages. Contact your dietitian for more options. WHAT FOODS ARE NOT RECOMMENDED? Grains White bread. White pasta. White rice. Refined cornbread. Bagels and croissants. Crackers that contain trans fat. Vegetables Creamed or fried vegetables. Vegetables in a cheese sauce. Regular canned vegetables. Regular canned tomato sauce and paste. Regular tomato and vegetable juices. Fruits Dried  fruits. Canned fruit in light or heavy syrup. Fruit juice. Meat and Other Protein Products Fatty cuts of meat. Ribs, chicken wings, bacon, sausage, bologna, salami, chitterlings, fatback, hot dogs, bratwurst, and packaged luncheon meats. Salted nuts and seeds. Canned beans with salt. Dairy Whole or 2% milk, cream,  half-and-half, and cream cheese. Whole-fat or sweetened yogurt. Full-fat cheeses or blue cheese. Nondairy creamers and whipped toppings. Processed cheese, cheese spreads, or cheese curds. Condiments Onion and garlic salt, seasoned salt, table salt, and sea salt. Canned and packaged gravies. Worcestershire sauce. Tartar sauce. Barbecue sauce. Teriyaki sauce. Soy sauce, including reduced sodium. Steak sauce. Fish sauce. Oyster sauce. Cocktail sauce. Horseradish. Ketchup and mustard. Meat flavorings and tenderizers. Bouillon cubes. Hot sauce. Tabasco sauce. Marinades. Taco seasonings. Relishes. Fats and Oils Butter, stick margarine, lard, shortening, ghee, and bacon fat. Coconut, palm kernel, or palm oils. Regular salad dressings. Other Pickles and olives. Salted popcorn and pretzels. The items listed above may not be a complete list of foods and beverages to avoid. Contact your dietitian for more information. WHERE CAN I FIND MORE INFORMATION? National Heart, Lung, and Blood Institute: travelstabloid.com   This information is not intended to replace advice given to you by your health care provider. Make sure you discuss any questions you have with your health care provider.   Document Released: 12/17/2010 Document Revised: 01/18/2014 Document Reviewed: 11/01/2012 Elsevier Interactive Patient Education 2016 [employer]. Hypertension Hypertension, commonly called high blood pressure, is when the force of blood pumping through your arteries is too strong. Your arteries are the blood vessels that carry blood from your heart throughout your body. A blood pressure reading consists of a higher number over a lower number, such as 110/72. The higher number (systolic) is the pressure inside your arteries when your heart pumps. The lower number (diastolic) is the pressure inside your arteries when your heart relaxes. Ideally you want your blood pressure below  120/80. Hypertension forces your heart to work harder to pump blood. Your arteries may become narrow or stiff. Having untreated or uncontrolled hypertension can cause heart attack, stroke, kidney disease, and other problems. RISK FACTORS Some risk factors for high blood pressure are controllable. Others are not.  Risk factors you cannot control include:   Race. You may be at higher risk if you are African American.  Age. Risk increases with age.  Gender. Men are at higher risk than women before age 21 years. After age 78, women are at higher risk than men. Risk factors you can control include:  Not getting enough exercise or physical activity.  Being overweight.  Getting too much fat, sugar, calories, or salt in your diet.  Drinking too much alcohol. SIGNS AND SYMPTOMS Hypertension does not usually cause signs or symptoms. Extremely high blood pressure (hypertensive crisis) may cause headache, anxiety, shortness of breath, and nosebleed. DIAGNOSIS To check if you have hypertension, your health care provider will measure your blood pressure while you are seated, with your arm held at the level of your heart. It should be measured at least twice using the same arm. Certain conditions can cause a difference in blood pressure between your right and left arms. A blood pressure reading that is higher than normal on one occasion does not mean that you need treatment. If it is not clear whether you have high blood pressure, you may be asked to return on a different day to have your blood pressure checked again. Or, you may be asked to monitor your blood pressure  at home for 1 or more weeks. TREATMENT Treating high blood pressure includes making lifestyle changes and possibly taking medicine. Living a healthy lifestyle can help lower high blood pressure. You may need to change some of your habits. Lifestyle changes may include:  Following the DASH diet. This diet is high in fruits, vegetables, and  whole grains. It is low in salt, red meat, and added sugars.  Keep your sodium intake below 2,300 mg per day.  Getting at least 30-45 minutes of aerobic exercise at least 4 times per week.  Losing weight if necessary.  Not smoking.  Limiting alcoholic beverages.  Learning ways to reduce stress. Your health care provider may prescribe medicine if lifestyle changes are not enough to get your blood pressure under control, and if one of the following is true:  You are 42-69 years of age and your systolic blood pressure is above 140.  You are 46 years of age or older, and your systolic blood pressure is above 150.  Your diastolic blood pressure is above 90.  You have diabetes, and your systolic blood pressure is over 474 or your diastolic blood pressure is over 90.  You have kidney disease and your blood pressure is above 140/90.  You have heart disease and your blood pressure is above 140/90. Your personal target blood pressure may vary depending on your medical conditions, your age, and other factors. HOME CARE INSTRUCTIONS  Have your blood pressure rechecked as directed by your health care provider.   Take medicines only as directed by your health care provider. Follow the directions carefully. Blood pressure medicines must be taken as prescribed. The medicine does not work as well when you skip doses. Skipping doses also puts you at risk for problems.  Do not smoke.   Monitor your blood pressure at home as directed by your health care provider. SEEK MEDICAL CARE IF:   You think you are having a reaction to medicines taken.  You have recurrent headaches or feel dizzy.  You have swelling in your ankles.  You have trouble with your vision. SEEK IMMEDIATE MEDICAL CARE IF:  You develop a severe headache or confusion.  You have unusual weakness, numbness, or feel faint.  You have severe chest or abdominal pain.  You vomit repeatedly.  You have trouble  breathing. MAKE SURE YOU:   Understand these instructions.  Will watch your condition.  Will get help right away if you are not doing well or get worse.   This information is not intended to replace advice given to you by your health care provider. Make sure you discuss any questions you have with your health care provider.   Document Released: 12/28/2004 Document Revised: 05/14/2014 Document Reviewed: 10/20/2012 Elsevier Interactive Patient Education [employer].

## 2014-10-29 ENCOUNTER — Telehealth: Payer: Self-pay | Admitting: Internal Medicine

## 2014-10-29 LAB — URINALYSIS, COMPLETE
BACTERIA UA: NONE SEEN [HPF]
BILIRUBIN URINE: NEGATIVE
Casts: NONE SEEN [LPF]
Crystals: NONE SEEN [HPF]
GLUCOSE, UA: NEGATIVE
Hgb urine dipstick: NEGATIVE
Ketones, ur: NEGATIVE
NITRITE: NEGATIVE
PH: 7.5 (ref 5.0–8.0)
PROTEIN: NEGATIVE
Specific Gravity, Urine: 1.01 (ref 1.001–1.035)
YEAST: NONE SEEN [HPF]

## 2014-10-29 LAB — LIPID PANEL
Cholesterol: 148 mg/dL (ref 125–200)
HDL: 31 mg/dL — AB (ref >=46)
LDL CALC: 96 mg/dL (ref <130)
Total CHOL/HDL Ratio: 4.8 Ratio (ref <=5.0)
Triglycerides: 106 mg/dL (ref <150)
VLDL: 21 mg/dL (ref <30)

## 2014-10-29 LAB — COMPLETE METABOLIC PANEL WITH GFR
ALBUMIN: 4.2 g/dL (ref 3.6–5.1)
ALK PHOS: 58 U/L (ref 33–130)
ALT: 14 U/L (ref 6–29)
AST: 11 U/L (ref 10–35)
BILIRUBIN TOTAL: 0.3 mg/dL (ref 0.2–1.2)
BUN: 12 mg/dL (ref 7–25)
CO2: 32 mmol/L — ABNORMAL HIGH (ref 20–31)
CREATININE: 0.78 mg/dL (ref 0.50–1.05)
Calcium: 9.9 mg/dL (ref 8.6–10.4)
Chloride: 98 mmol/L (ref 98–110)
GFR, EST NON AFRICAN AMERICAN: 85 mL/min (ref >=60)
Glucose, Bld: 92 mg/dL (ref 65–99)
Potassium: 3.1 mmol/L — ABNORMAL LOW (ref 3.5–5.3)
Sodium: 142 mmol/L (ref 135–146)
TOTAL PROTEIN: 7.3 g/dL (ref 6.1–8.1)

## 2014-10-29 NOTE — Telephone Encounter (Signed)
Patient calls stating that medication that she was given for knee pain (possibly naproxen (NAPROSYN) 500 MG tablet) is causing swelling in both of her hands. Patient asking about alternate medication. Please f/u ASAP.

## 2014-10-30 ENCOUNTER — Other Ambulatory Visit: Payer: Self-pay | Admitting: Internal Medicine

## 2014-10-30 DIAGNOSIS — M1712 Unilateral primary osteoarthritis, left knee: Secondary | ICD-10-CM

## 2014-10-30 MED ORDER — POTASSIUM CHLORIDE ER 20 MEQ PO TBCR: 20.0000 meq | EXTENDED_RELEASE_TABLET | Freq: Every day | ORAL | Status: DC

## 2014-10-30 MED ORDER — IBUPROFEN 400 MG PO TABS: 400.0000 mg | ORAL_TABLET | Freq: Four times a day (QID) | ORAL | Status: DC | PRN

## 2014-11-06 NOTE — Telephone Encounter (Signed)
Patient verified DOB Patient informed of her lab results being mostly normal. Medical Assistant informed patient of potassium being slightly low and serum test being repeated at her next visit. Patient advised to continue taking her potassium pills as prescribed. Patient expressed her understanding and had no further questions.

## 2014-11-06 NOTE — Telephone Encounter (Signed)
-----   Message from Tresa Garter, MD sent at 11/06/2014 10:35 AM EDT ----- Please inform patient about her laboratory test results are mostly within normal limits. Potassium is slightly low, encourage patient to take her potassium pills as prescribed. Will repeat serum potassium during her next visit.

## 2014-11-13 ENCOUNTER — Ambulatory Visit
Admission: RE | Admit: 2014-11-13 | Discharge: 2014-11-13 | Disposition: A | Discharge status: Home / Self Care | Payer: No Typology Code available for payment source | Source: Ambulatory Visit | Attending: Internal Medicine | Admitting: Internal Medicine

## 2014-11-13 DIAGNOSIS — Z1231 Encounter for screening mammogram for malignant neoplasm of breast: Secondary | ICD-10-CM

## 2014-11-19 ENCOUNTER — Encounter: Payer: Self-pay | Admitting: *Deleted

## 2014-11-19 ENCOUNTER — Telehealth: Payer: Self-pay | Admitting: Internal Medicine

## 2014-11-19 NOTE — Telephone Encounter (Signed)
Patient verified DOB Patient informed of letter being generated and ready for pick-up. Patient stated she would pick the item up in the morning. Patient expressed her gratification and had no further questions.

## 2014-11-19 NOTE — Telephone Encounter (Signed)
Patient called to request a letter for her gym stating that she is able to do light exercise. Please f/u

## 2014-12-13 ENCOUNTER — Telehealth: Payer: Self-pay | Admitting: *Deleted

## 2014-12-13 NOTE — Telephone Encounter (Signed)
Patient verified DOB Patient informed of mammogram screening showing no evidence of malignancy,. Patient was informed having a recommended screening in one year. Patient had no further questions.

## 2014-12-13 NOTE — Telephone Encounter (Signed)
-----   Message from Tresa Garter, MD sent at 11/15/2014  2:04 PM EDT ----- Please inform patient that her screening mammogram shows no evidence of malignancy. Recommend screening mammogram in one year

## 2016-01-29 ENCOUNTER — Emergency Department (HOSPITAL_COMMUNITY): Payer: BLUE CROSS/BLUE SHIELD

## 2016-01-29 ENCOUNTER — Emergency Department (HOSPITAL_COMMUNITY)
Admission: EM | Admit: 2016-01-29 | Discharge: 2016-01-29 | Disposition: A | Discharge status: Home / Self Care | Payer: BLUE CROSS/BLUE SHIELD | Attending: Emergency Medicine | Admitting: Emergency Medicine

## 2016-01-29 ENCOUNTER — Encounter (HOSPITAL_COMMUNITY): Payer: Self-pay | Admitting: Emergency Medicine

## 2016-01-29 DIAGNOSIS — F17200 Nicotine dependence, unspecified, uncomplicated: Secondary | ICD-10-CM | POA: Insufficient documentation

## 2016-01-29 DIAGNOSIS — Z79899 Other long term (current) drug therapy: Secondary | ICD-10-CM | POA: Diagnosis not present

## 2016-01-29 DIAGNOSIS — Y999 Unspecified external cause status: Secondary | ICD-10-CM | POA: Insufficient documentation

## 2016-01-29 DIAGNOSIS — I10 Essential (primary) hypertension: Secondary | ICD-10-CM | POA: Insufficient documentation

## 2016-01-29 DIAGNOSIS — W19XXXA Unspecified fall, initial encounter: Secondary | ICD-10-CM

## 2016-01-29 DIAGNOSIS — M25462 Effusion, left knee: Secondary | ICD-10-CM

## 2016-01-29 DIAGNOSIS — W000XXA Fall on same level due to ice and snow, initial encounter: Secondary | ICD-10-CM | POA: Insufficient documentation

## 2016-01-29 DIAGNOSIS — Y929 Unspecified place or not applicable: Secondary | ICD-10-CM | POA: Diagnosis not present

## 2016-01-29 DIAGNOSIS — M25562 Pain in left knee: Secondary | ICD-10-CM | POA: Diagnosis present

## 2016-01-29 DIAGNOSIS — Y939 Activity, unspecified: Secondary | ICD-10-CM | POA: Insufficient documentation

## 2016-01-29 NOTE — Discharge Instructions (Signed)
Read the information below.  You may return to the Emergency Department at any time for worsening condition or any new symptoms that concern you.   If you develop uncontrolled pain, weakness or numbness of the extremity, severe discoloration of the skin, or you are unable to move your toes, return to the ER for a recheck.    °

## 2016-01-29 NOTE — ED Notes (Signed)
Applied ice to left knee.

## 2016-01-29 NOTE — ED Triage Notes (Signed)
Pt sts left knee pain after slip and fall yesterday

## 2016-01-29 NOTE — ED Provider Notes (Signed)
[facility] DEPT Provider Note   CSN: [id number] Arrival date & time: 01/29/16  1124     History   Chief Complaint Chief Complaint  Patient presents with  . Fall    HPI Christine Rocha is a 61 y.o. female.  HPI   Pt with hx HTN, seizures p/w left knee pain and swelling after accidentally slipping and falling on the ice today.  States she was going down a hill and her left leg went backwards under her.  Reports pain and swelling in the lateral knee.  Has been walking with a cane.  Denies any other injury.  Denies weakness or numbness of the leg.    Past Medical History:  Diagnosis Date  . Hypertension   . Seizures [facility])     Patient Active Problem List   Diagnosis Date Noted  . Preventative health care 10/28/2014  . Encounter for screening mammogram for breast cancer 10/28/2014  . Primary osteoarthritis of left knee 10/28/2014  . Essential hypertension 11/15/2013  . Prediabetes 11/15/2013  . Uncontrolled hypertension 10/11/2012  . Smoking addiction 10/11/2012    History reviewed. No pertinent surgical history.  OB History    Gravida Para Term Preterm AB Living   3 0 0 0 3 0   SAB TAB Ectopic Multiple Live Births   3 0             Home Medications    Prior to Admission medications   Medication Sig Start Date End Date Taking? Authorizing Provider  amLODipine (NORVASC) 10 MG tablet Take 1 tablet (10 mg total) by mouth daily. 10/28/14   Tresa Garter, MD  atenolol-chlorthalidone (TENORETIC 100) 100-25 MG tablet Take 1 tablet by mouth daily. 10/28/14   Tresa Garter, MD  buPROPion (ZYBAN) 150 MG 12 hr tablet Take 1 tablet (150 mg total) by mouth 2 (two) times daily. Patient not taking: Reported on 10/28/2014 11/20/12   Robbie Lis, MD  ibuprofen (ADVIL,MOTRIN) 400 MG tablet Take 1 tablet (400 mg total) by mouth every 6 (six) hours as needed for fever or moderate pain. 10/30/14   Tresa Garter, MD  nicotine (NICODERM CQ - DOSED IN MG/24 HOURS)  14 mg/24hr patch Place 1 patch (14 mg total) onto the skin daily. Patient not taking: Reported on 10/28/2014 11/20/12   Robbie Lis, MD  potassium chloride 20 MEQ TBCR Take 20 mEq by mouth daily. 10/30/14   Tresa Garter, MD    Family History History reviewed. No pertinent family history.  Social History Social History  Substance Use Topics  . Smoking status: Current Every Day Smoker    Packs/day: 0.25  . Smokeless tobacco: Never Used  . Alcohol use No     Allergies   Penicillins   Review of Systems Review of Systems  Constitutional: Negative for fever.  Musculoskeletal: Positive for arthralgias and gait problem.  Skin: Negative for color change and wound.  Neurological: Negative for weakness and numbness.  Hematological: Does not bruise/bleed easily.  Psychiatric/Behavioral: Negative for self-injury.     Physical Exam Updated Vital Signs BP (!) 160/101 (BP Location: Left Arm)   Pulse 98   Temp 98.5 F (36.9 C) (Oral)   Resp 19   Ht 5\' 2"  (1.575 m)   Wt 91.8 kg   SpO2 99%   BMI 37.00 kg/m   Physical Exam  Constitutional: She appears well-developed and well-nourished. No distress.  HENT:  Head: Normocephalic and atraumatic.  Neck: Neck supple.  Pulmonary/Chest:  Effort normal.  Musculoskeletal:  Left knee edematous, worse over the superolateral aspect where she is also tender to palpation.  Full AROM but slowly, no laxity of joint but pain with stress in any direction.  Left hip with full passive ROM without pain.  NO other focal bony tenderness.  Distal sensation and pulses intact.  Compartments are soft.   Neurological: She is alert.  Skin: She is not diaphoretic.  Nursing note and vitals reviewed.    ED Treatments / Results  Labs (all labs ordered are listed, but only abnormal results are displayed) Labs Reviewed - No data to display  EKG  EKG Interpretation None       Radiology Dg Knee Complete 4 Views Left  Result Date:  01/29/2016 CLINICAL DATA:  Pain after trauma EXAM: LEFT KNEE - COMPLETE 4+ VIEW COMPARISON:  None. FINDINGS: There is a moderate-sized joint effusion in the suprapatellar space. Tricompartmental degenerative changes are identified with loss of joint space medially. No fractures are seen. IMPRESSION: Moderate-sized joint effusion and degenerative changes with no identified fracture. Electronically Signed   By: Dorise Bullion III M.D   On: 01/29/2016 13:18    Procedures Procedures (including critical care time)  Medications Ordered in ED Medications - No data to display   Initial Impression / Assessment and Plan / ED Course  I have reviewed the triage vital signs and the nursing notes.  Pertinent labs & imaging results that were available during my care of the patient were reviewed by me and considered in my medical decision making (see chart for details).     Afebrile, nontoxic patient with injury to her left knee while slipping and falling on the ice.  Neurovascularly intact.  Denies other injury.   Xray demonstrates effusion and chronic arthritis.    D/C home with knee immobilizer, orthopedic follow up.  Pt has cane, which she prefers, declined crutches.  Declines pain medication.  Discussed result, findings, treatment, and follow up  with patient.  Pt given return precautions.  Pt verbalizes understanding and agrees with plan.      Final Clinical Impressions(s) / ED Diagnoses   Final diagnoses:  Fall, initial encounter  Effusion of left knee    New Prescriptions New Prescriptions   No medications on file     Clayton Bibles, PA-C 01/29/16 [place], MD 01/29/16 2040

## 2016-01-29 NOTE — ED Notes (Signed)
Ortho tech applied knee imobilizer. Pt verbalized DC instructions and need to follow up with ortho. NAD. Wheel chair offered out. Pt being driven home by husband.

## 2016-02-13 ENCOUNTER — Telehealth: Payer: Self-pay | Admitting: Internal Medicine

## 2016-02-13 DIAGNOSIS — I10 Essential (primary) hypertension: Secondary | ICD-10-CM

## 2016-02-13 MED ORDER — AMLODIPINE BESYLATE 10 MG PO TABS: 10.0000 mg | ORAL_TABLET | Freq: Every day | ORAL | 0 refills | Status: DC

## 2016-02-13 MED ORDER — ATENOLOL-CHLORTHALIDONE 100-25 MG PO TABS
1.0000 | ORAL_TABLET | Freq: Every day | ORAL | 0 refills | Status: DC
Start: 2016-02-13 — End: 2016-02-25

## 2016-02-13 NOTE — Telephone Encounter (Signed)
Refill of BP medication  Pt is re-establishing with Dr. Doreene Burke on Feb. 14th  Pt would like medication to be sent to [facility] on [address] Thank you

## 2016-02-13 NOTE — Telephone Encounter (Signed)
Refilled blood pressure medications - must have office visit for any further refills.

## 2016-02-25 ENCOUNTER — Ambulatory Visit: Payer: BLUE CROSS/BLUE SHIELD | Attending: Internal Medicine | Admitting: Internal Medicine

## 2016-02-25 ENCOUNTER — Encounter: Payer: Self-pay | Admitting: Internal Medicine

## 2016-02-25 VITALS — BP 137/70 | HR 61 | Temp 97.9°F | Resp 18 | Ht 63.0 in | Wt 201.0 lb

## 2016-02-25 DIAGNOSIS — I10 Essential (primary) hypertension: Secondary | ICD-10-CM

## 2016-02-25 DIAGNOSIS — R569 Unspecified convulsions: Secondary | ICD-10-CM | POA: Insufficient documentation

## 2016-02-25 DIAGNOSIS — R040 Epistaxis: Secondary | ICD-10-CM | POA: Insufficient documentation

## 2016-02-25 DIAGNOSIS — Z23 Encounter for immunization: Secondary | ICD-10-CM | POA: Diagnosis not present

## 2016-02-25 DIAGNOSIS — R05 Cough: Secondary | ICD-10-CM | POA: Diagnosis not present

## 2016-02-25 DIAGNOSIS — Z880 Allergy status to penicillin: Secondary | ICD-10-CM | POA: Diagnosis not present

## 2016-02-25 DIAGNOSIS — F17200 Nicotine dependence, unspecified, uncomplicated: Secondary | ICD-10-CM | POA: Insufficient documentation

## 2016-02-25 DIAGNOSIS — R042 Hemoptysis: Secondary | ICD-10-CM | POA: Insufficient documentation

## 2016-02-25 MED ORDER — ATENOLOL-CHLORTHALIDONE 100-25 MG PO TABS: 1.0000 | ORAL_TABLET | Freq: Every day | ORAL | 3 refills | Status: DC

## 2016-02-25 MED ORDER — AMLODIPINE BESYLATE 10 MG PO TABS: 10.0000 mg | ORAL_TABLET | Freq: Every day | ORAL | 3 refills | Status: DC

## 2016-02-25 NOTE — Progress Notes (Signed)
Patient is here to establish care for BP  Patient complains of left knee pain due to a fall last week. Pain increases with exertion.  Patient would like the flu vaccine today. Patient tolerated the flu vaccine well today.  Patient has taken medication today. Patient has eaten today.

## 2016-02-25 NOTE — Progress Notes (Signed)
Subjective:    Patient ID: KALENE ABDO, female    DOB: 03-Jul-1955, 61 y.o.   MRN: [id number]  HPI Patient presents today with complain of nose bleed and hemoptysis X 1 time last week. The hemotypsis occurred with cough. Patient also had dizziness and shortness of breath. She denies syncopal episode, weight loss, loss of appetite, but endorsed night sweats. Denies any recent trauma. The cough is mostly at night and tend to wake her up from sleep. She denies fever, endorsed some chills and vomited a couple of times. She is an every day cigarette smoker, but denies alcohol use.  Past Medical History:  Diagnosis Date  . Hypertension   . Seizures ([place])    Current Outpatient Prescriptions on File Prior to Visit  Medication Sig Dispense Refill  . ibuprofen (ADVIL,MOTRIN) 400 MG tablet Take 1 tablet (400 mg total) by mouth every 6 (six) hours as needed for fever or moderate pain. 60 tablet 1  . buPROPion (ZYBAN) 150 MG 12 hr tablet Take 1 tablet (150 mg total) by mouth 2 (two) times daily. (Patient not taking: Reported on 10/28/2014) 60 tablet 5  . nicotine (NICODERM CQ - DOSED IN MG/24 HOURS) 14 mg/24hr patch Place 1 patch (14 mg total) onto the skin daily. (Patient not taking: Reported on 10/28/2014) 28 patch 5  . potassium chloride 20 MEQ TBCR Take 20 mEq by mouth daily. (Patient not taking: Reported on 02/25/2016) 30 tablet 3   No current facility-administered medications on file prior to visit.    Allergies  Allergen Reactions  . Penicillins    Review of Systems  Constitutional: Negative for fatigue and unexpected weight change.  HENT: Positive for nosebleeds. Negative for congestion, sinus pain, sinus pressure, sneezing, sore throat, trouble swallowing and voice change.   Eyes: Negative for photophobia and visual disturbance.  Respiratory: Positive for cough and shortness of breath. Negative for chest tightness and wheezing.   Cardiovascular: Negative for palpitations and leg swelling.   Gastrointestinal: Negative for abdominal distention, abdominal pain, blood in stool and diarrhea.  Endocrine: Negative.   Genitourinary: Negative for dysuria, hematuria and vaginal bleeding.  Musculoskeletal: Negative for arthralgias, back pain and joint swelling.  Skin: Negative.   Neurological: Positive for dizziness. Negative for weakness, light-headedness, numbness and headaches.  Hematological: Negative.   Psychiatric/Behavioral: Negative for sleep disturbance.      Objective: BP 137/70 (BP Location: Right Arm, Patient Position: Sitting, Cuff Size: Normal)   Pulse 61   Temp 97.9 F (36.6 C) (Oral)   Resp 18   Ht 5\' 3"  (1.6 m)   Wt 201 lb (91.2 kg)   SpO2 96%   BMI 35.61 kg/m     Physical Exam  Constitutional: She is oriented to person, place, and time. She appears well-developed and well-nourished. No distress.  HENT:  Head: Normocephalic and atraumatic.  Right Ear: External ear normal.  Left Ear: External ear normal.  Mouth/Throat: Oropharynx is clear and moist.  Eyes: Conjunctivae are normal. Pupils are equal, round, and reactive to light.  Neck: Normal range of motion. No thyromegaly present.  Cardiovascular: Normal rate, regular rhythm and intact distal pulses.   Murmur heard. Pulmonary/Chest: Effort normal and breath sounds normal. She has no wheezes.  Abdominal: Soft. Bowel sounds are normal. There is no tenderness.  Musculoskeletal: Normal range of motion. She exhibits no edema or tenderness.  Lymphadenopathy:    She has no cervical adenopathy.  Neurological: She is alert and oriented to person, place, and time.  Skin: Skin is warm and dry.  Psychiatric: She has a normal mood and affect. Her behavior is normal.      Assessment & Plan:  1. Essential hypertension  Controlled, low salt diet discussed and encouraged Refill - atenolol-chlorthalidone (TENORETIC 100) 100-25 MG tablet; Take 1 tablet by mouth daily.  Dispense: 90 tablet; Refill: 3 - amLODipine  (NORVASC) 10 MG tablet; Take 1 tablet (10 mg total) by mouth daily.  Dispense: 90 tablet; Refill: 3  - COMPLETE METABOLIC PANEL WITH GFR - CBC with Differential/Platelet - Lipid panel - TSH - Urinalysis, Complete  2. Smoking addiction  The patient is sincerely urged to quit smoking. The numerous direct health benefits are discussed. If she decides to quit, there are a number of helpful adjunctive aids, and she can see [place] to discuss nicotine replacement therapy and bupropion anytime in the future.  3. Cough with hemoptysis  Cough with hemoptysis is concerning in patients with a history of heavy smoking - CT Chest W Contrast; Future  4. Needs flu shot  - Flu Vaccine QUAD 36+ mos PF IM (Fluarix & Fluzone Quad PF)  Labs pending Health maintenance reviewed Diet and exercise encouraged Continue all meds Follow up  in 3 months  Jari Favre, RN, BSN, AGNP-Student  Evaluation and management procedures were performed by me with DNP Student in attendance, note written by DNP student under my supervision and collaboration. I have reviewed the note and I agree with the management and plan.   Angelica Chessman, MD, [place], [place], [place], [place] and [place], [place]   02/26/2016, 5:34 PM

## 2016-02-25 NOTE — Patient Instructions (Signed)
DASH Eating Plan DASH stands for "Dietary Approaches to Stop Hypertension." The DASH eating plan is a healthy eating plan that has been shown to reduce high blood pressure (hypertension). Additional health benefits may include reducing the risk of type 2 diabetes mellitus, heart disease, and stroke. The DASH eating plan may also help with weight loss. What do I need to know about the DASH eating plan? For the DASH eating plan, you will follow these general guidelines:  Choose foods with less than 150 milligrams of sodium per serving (as listed on the food label).  Use salt-free seasonings or herbs instead of table salt or sea salt.  Check with your health care provider or pharmacist before using salt substitutes.  Eat lower-sodium products. These are often labeled as "low-sodium" or "no salt added."  Eat fresh foods. Avoid eating a lot of canned foods.  Eat more vegetables, fruits, and low-fat dairy products.  Choose whole grains. Look for the word "whole" as the first word in the ingredient list.  Choose fish and skinless chicken or turkey more often than red meat. Limit fish, poultry, and meat to 6 oz (170 g) each day.  Limit sweets, desserts, sugars, and sugary drinks.  Choose heart-healthy fats.  Eat more home-cooked food and less restaurant, buffet, and fast food.  Limit fried foods.  Do not fry foods. Cook foods using methods such as baking, boiling, grilling, and broiling instead.  When eating at a restaurant, ask that your food be prepared with less salt, or no salt if possible. What foods can I eat? Seek help from a dietitian for individual calorie needs. Grains  Whole grain or whole wheat bread. Brown rice. Whole grain or whole wheat pasta. Quinoa, bulgur, and whole grain cereals. Low-sodium cereals. Corn or whole wheat flour tortillas. Whole grain cornbread. Whole grain crackers. Low-sodium crackers. Vegetables  Fresh or frozen vegetables (raw, steamed, roasted, or  grilled). Low-sodium or reduced-sodium tomato and vegetable juices. Low-sodium or reduced-sodium tomato sauce and paste. Low-sodium or reduced-sodium canned vegetables. Fruits  All fresh, canned (in natural juice), or frozen fruits. Meat and Other Protein Products  Ground beef (85% or leaner), grass-fed beef, or beef trimmed of fat. Skinless chicken or turkey. Ground chicken or turkey. Pork trimmed of fat. All fish and seafood. Eggs. Dried beans, peas, or lentils. Unsalted nuts and seeds. Unsalted canned beans. Dairy  Low-fat dairy products, such as skim or 1% milk, 2% or reduced-fat cheeses, low-fat ricotta or cottage cheese, or plain low-fat yogurt. Low-sodium or reduced-sodium cheeses. Fats and Oils  Tub margarines without trans fats. Light or reduced-fat mayonnaise and salad dressings (reduced sodium). Avocado. Safflower, olive, or canola oils. Natural peanut or almond butter. Other  Unsalted popcorn and pretzels. The items listed above may not be a complete list of recommended foods or beverages. Contact your dietitian for more options.  What foods are not recommended? Grains  White bread. White pasta. White rice. Refined cornbread. Bagels and croissants. Crackers that contain trans fat. Vegetables  Creamed or fried vegetables. Vegetables in a cheese sauce. Regular canned vegetables. Regular canned tomato sauce and paste. Regular tomato and vegetable juices. Fruits  Canned fruit in light or heavy syrup. Fruit juice. Meat and Other Protein Products  Fatty cuts of meat. Ribs, chicken wings, bacon, sausage, bologna, salami, chitterlings, fatback, hot dogs, bratwurst, and packaged luncheon meats. Salted nuts and seeds. Canned beans with salt. Dairy  Whole or 2% milk, cream, half-and-half, and cream cheese. Whole-fat or sweetened yogurt. Full-fat cheeses   or blue cheese. Nondairy creamers and whipped toppings. Processed cheese, cheese spreads, or cheese curds. Condiments  Onion and garlic  salt, seasoned salt, table salt, and sea salt. Canned and packaged gravies. Worcestershire sauce. Tartar sauce. Barbecue sauce. Teriyaki sauce. Soy sauce, including reduced sodium. Steak sauce. Fish sauce. Oyster sauce. Cocktail sauce. Horseradish. Ketchup and mustard. Meat flavorings and tenderizers. Bouillon cubes. Hot sauce. Tabasco sauce. Marinades. Taco seasonings. Relishes. Fats and Oils  Butter, stick margarine, lard, shortening, ghee, and bacon fat. Coconut, palm kernel, or palm oils. Regular salad dressings. Other  Pickles and olives. Salted popcorn and pretzels. The items listed above may not be a complete list of foods and beverages to avoid. Contact your dietitian for more information.  Where can I find more information? National Heart, Lung, and Blood Institute: travelstabloid.com This information is not intended to replace advice given to you by your health care provider. Make sure you discuss any questions you have with your health care provider. Document Released: 12/17/2010 Document Revised: 06/05/2015 Document Reviewed: 11/01/2012 Elsevier Interactive Patient Education  2017 Else[employer] Inc. Hypertension Hypertension, commonly called high blood pressure, is when the force of blood pumping through your arteries is too strong. Your arteries are the blood vessels that carry blood from your heart throughout your body. A blood pressure reading consists of a higher number over a lower number, such as 110/72. The higher number (systolic) is the pressure inside your arteries when your heart pumps. The lower number (diastolic) is the pressure inside your arteries when your heart relaxes. Ideally you want your blood pressure below 120/80. Hypertension forces your heart to work harder to pump blood. Your arteries may become narrow or stiff. Having untreated or uncontrolled hypertension can cause heart attack, stroke, kidney disease, and other problems. What  increases the risk? Some risk factors for high blood pressure are controllable. Others are not. Risk factors you cannot control include:  Race. You may be at higher risk if you are African American.  Age. Risk increases with age.  Gender. Men are at higher risk than women before age 80 years. After age 60, women are at higher risk than men. Risk factors you can control include:  Not getting enough exercise or physical activity.  Being overweight.  Getting too much fat, sugar, calories, or salt in your diet.  Drinking too much alcohol. What are the signs or symptoms? Hypertension does not usually cause signs or symptoms. Extremely high blood pressure (hypertensive crisis) may cause headache, anxiety, shortness of breath, and nosebleed. How is this diagnosed? To check if you have hypertension, your health care provider will measure your blood pressure while you are seated, with your arm held at the level of your heart. It should be measured at least twice using the same arm. Certain conditions can cause a difference in blood pressure between your right and left arms. A blood pressure reading that is higher than normal on one occasion does not mean that you need treatment. If it is not clear whether you have high blood pressure, you may be asked to return on a different day to have your blood pressure checked again. Or, you may be asked to monitor your blood pressure at home for 1 or more weeks. How is this treated? Treating high blood pressure includes making lifestyle changes and possibly taking medicine. Living a healthy lifestyle can help lower high blood pressure. You may need to change some of your habits. Lifestyle changes may include:  Following the DASH diet. This  diet is high in fruits, vegetables, and whole grains. It is low in salt, red meat, and added sugars.  Keep your sodium intake below 2,300 mg per day.  Getting at least 30-45 minutes of aerobic exercise at least 4 times  per week.  Losing weight if necessary.  Not smoking.  Limiting alcoholic beverages.  Learning ways to reduce stress. Your health care provider may prescribe medicine if lifestyle changes are not enough to get your blood pressure under control, and if one of the following is true:  You are 7-20 years of age and your systolic blood pressure is above 140.  You are 12 years of age or older, and your systolic blood pressure is above 150.  Your diastolic blood pressure is above 90.  You have diabetes, and your systolic blood pressure is over [id number] or your diastolic blood pressure is over 90.  You have kidney disease and your blood pressure is above 140/90.  You have heart disease and your blood pressure is above 140/90. Your personal target blood pressure may vary depending on your medical conditions, your age, and other factors. Follow these instructions at home:  Have your blood pressure rechecked as directed by your health care provider.  Take medicines only as directed by your health care provider. Follow the directions carefully. Blood pressure medicines must be taken as prescribed. The medicine does not work as well when you skip doses. Skipping doses also puts you at risk for problems.  Do not smoke.  Monitor your blood pressure at home as directed by your health care provider. Contact a health care provider if:  You think you are having a reaction to medicines taken.  You have recurrent headaches or feel dizzy.  You have swelling in your ankles.  You have trouble with your vision. Get help right away if:  You develop a severe headache or confusion.  You have unusual weakness, numbness, or feel faint.  You have severe chest or abdominal pain.  You vomit repeatedly.  You have trouble breathing. This information is not intended to replace advice given to you by your health care provider. Make sure you discuss any questions you have with your health care  provider. Document Released: 12/28/2004 Document Revised: 06/05/2015 Document Reviewed: 10/20/2012 Elsevier Interactive Patient Education  2017 Elsevier Inc. Hemoptysis Hemoptysis is coughing up blood. It can be mild or serious. With mild hemoptysis, you may cough up blood-streaked saliva and mucus (sputum) when you have an infection in your nose, throat, or lungs (respiratory tract). Coughing up 1-2 cups (240-480 mL) of blood within 24 hours (massive hemoptysis) is a medical emergency. The most common cause of hemoptysis is a respiratory tract infection, such as bronchitis or pneumonia. Other common causes include:  A lung tumor or upper airway tumor.  A medical condition that damages the large air passageways (bronchiectasis).  A blood clot in the lungs (pulmonary embolism).  A medical condition that keeps your blood from clotting normally.  Breathing in (inhaling) a small foreign object. Sometimes the cause is not known. Hemoptysis can be a sign of a minor or serious medical condition, so it is important to see your health care provider. Follow these instructions at home:  Watch your condition for any changes.  Take over-the-counter and prescription medicines only as told by your health care provider.  If you were prescribed an antibiotic medicine, take it as told by your health care provider. Do not stop taking the antibiotic even if you start to feel  better.  Return to your normal activities as told by your health care provider. Ask your health care provider what activities are safe for you.  Do not use any products that contain nicotine or tobacco, such as cigarettes and e-cigarettes. If you need help quitting, ask your health care provider.  Keep all follow-up visits as told by your health care provider. This is important. Contact a health care provider if:  You have a fever.  You cough up blood-streaked (blood-tinged) sputum. Get help right away if:  You cough up fresh  blood or blood clots.  You have trouble breathing.  You have chest pain. This information is not intended to replace advice given to you by your health care provider. Make sure you discuss any questions you have with your health care provider. Document Released: 03/08/2001 Document Revised: 09/26/2015 Document Reviewed: 09/26/2015 Elsevier Interactive Patient Education  2017 [employer].

## 2016-02-26 ENCOUNTER — Other Ambulatory Visit: Payer: Self-pay | Admitting: Internal Medicine

## 2016-02-26 LAB — CBC WITH DIFFERENTIAL/PLATELET
Basophils Absolute: 0 cells/uL (ref 0–200)
Basophils Relative: 0 %
EOS ABS: 170 {cells}/uL (ref 15–500)
EOS PCT: 2 %
HCT: 39.5 % (ref 35.0–45.0)
HEMOGLOBIN: 13.1 g/dL (ref 11.7–15.5)
LYMPHS ABS: 3060 {cells}/uL (ref 850–3900)
Lymphocytes Relative: 36 %
MCH: 28.6 pg (ref 27.0–33.0)
MCHC: 33.2 g/dL (ref 32.0–36.0)
MCV: 86.2 fL (ref 80.0–100.0)
MPV: 10.5 fL (ref 7.5–12.5)
Monocytes Absolute: 595 cells/uL (ref 200–950)
Monocytes Relative: 7 %
Neutro Abs: 4675 cells/uL (ref 1500–7800)
Neutrophils Relative %: 55 %
Platelets: 365 10*3/uL (ref 140–400)
RBC: 4.58 MIL/uL (ref 3.80–5.10)
RDW: 14.4 % (ref 11.0–15.0)
WBC: 8.5 10*3/uL (ref 3.8–10.8)

## 2016-02-26 LAB — LIPID PANEL
CHOLESTEROL: 137 mg/dL (ref <200)
HDL: 36 mg/dL — AB (ref >50)
LDL Cholesterol: 68 mg/dL (ref <100)
Total CHOL/HDL Ratio: 3.8 Ratio (ref <5.0)
Triglycerides: 163 mg/dL — ABNORMAL HIGH (ref <150)
VLDL: 33 mg/dL — AB (ref <30)

## 2016-02-26 LAB — TSH: TSH: 1.04 mIU/L

## 2016-02-26 LAB — COMPLETE METABOLIC PANEL WITH GFR
ALK PHOS: 82 U/L (ref 33–130)
ALT: 23 U/L (ref 6–29)
AST: 14 U/L (ref 10–35)
Albumin: 4.4 g/dL (ref 3.6–5.1)
BUN: 16 mg/dL (ref 7–25)
CO2: 29 mmol/L (ref 20–31)
Calcium: 9.6 mg/dL (ref 8.6–10.4)
Chloride: 96 mmol/L — ABNORMAL LOW (ref 98–110)
Creat: 0.88 mg/dL (ref 0.50–1.05)
GFR, EST AFRICAN AMERICAN: 84 mL/min (ref >=60)
GFR, EST NON AFRICAN AMERICAN: 73 mL/min (ref >=60)
GLUCOSE: 123 mg/dL — AB (ref 65–99)
POTASSIUM: 3.1 mmol/L — AB (ref 3.5–5.3)
SODIUM: 138 mmol/L (ref 135–146)
Total Bilirubin: 0.3 mg/dL (ref 0.2–1.2)
Total Protein: 6.9 g/dL (ref 6.1–8.1)

## 2016-02-26 LAB — URINALYSIS, COMPLETE
BILIRUBIN URINE: NEGATIVE
Bacteria, UA: NONE SEEN [HPF]
CASTS: NONE SEEN [LPF]
CRYSTALS: NONE SEEN [HPF]
Glucose, UA: NEGATIVE
Ketones, ur: NEGATIVE
Nitrite: NEGATIVE
PROTEIN: NEGATIVE
RBC / HPF: NONE SEEN RBC/HPF (ref <=2)
SPECIFIC GRAVITY, URINE: 1.004 (ref 1.001–1.035)
Yeast: NONE SEEN [HPF]
pH: 6.5 (ref 5.0–8.0)

## 2016-02-26 MED ORDER — POTASSIUM CHLORIDE ER 20 MEQ PO TBCR: 20.0000 meq | EXTENDED_RELEASE_TABLET | Freq: Every day | ORAL | 3 refills | Status: DC

## 2016-03-01 ENCOUNTER — Telehealth: Payer: Self-pay | Admitting: *Deleted

## 2016-03-01 NOTE — Telephone Encounter (Signed)
-----   Message from Tresa Garter, MD sent at 02/26/2016 10:25 AM EST ----- Please inform patient that her lab results are mostly normal except for her low potassium level. Please restart the potassium tablets, prescribed to the pharmacy.

## 2016-03-01 NOTE — Telephone Encounter (Signed)
MA unable to leave a message due to phone ringing and disconnecting.

## 2016-03-09 ENCOUNTER — Telehealth: Payer: Self-pay | Admitting: Internal Medicine

## 2016-03-09 NOTE — Telephone Encounter (Signed)
[employer] Denied the procedure of Ct Chest W Contrast and the pcp need to contact them at [place] follow up the prompt to talk with a physician  Tahnk you

## 2016-03-09 NOTE — Telephone Encounter (Signed)
[employer] is requesting a peer review for the patients procedure. Please contact the physicians line at the number listed above to initiate a review if the procedure is necessary.

## 2016-03-15 ENCOUNTER — Telehealth: Payer: Self-pay | Admitting: Internal Medicine

## 2016-03-15 NOTE — Telephone Encounter (Signed)
Christine Rocha returned your phone call to complete the appeal process for the CT of chest

## 2016-03-15 NOTE — Telephone Encounter (Signed)
Sharee Pimple from [employer] called the office to speak with PCP regarding a an appeal for CT chest. Please follow up.   Thank you.

## 2016-03-16 ENCOUNTER — Encounter: Payer: Self-pay | Admitting: *Deleted

## 2016-03-16 NOTE — Telephone Encounter (Signed)
Are these available for me to print and fax on your behalf?

## 2016-03-16 NOTE — Telephone Encounter (Signed)
Great, thanks

## 2016-03-16 NOTE — Telephone Encounter (Signed)
Should I send the xray image any ways or will the notes have to suffice?

## 2016-03-16 NOTE — Telephone Encounter (Signed)
Yes please

## 2016-03-16 NOTE — Telephone Encounter (Signed)
I sent the office notes from the visit on 02/25/16 and the visit with you in 2016. The patient has no other testing besides an xray for a positive PPD.

## 2016-03-16 NOTE — Telephone Encounter (Signed)
Sharee Pimple from [employer] called this morning to inform PCP that documentation (office notes, history and imaging) needs to be faxed for support (of CT)  them by this Friday 3/9. Please fax it to [phone]. Attn: appeals. Please follow up.  Thank you.

## 2016-03-17 NOTE — Telephone Encounter (Signed)
Christine Rocha ([employer]) called the office to speak with PCP to inform him that request for CT was denied. If you have any question you can contact her. [phone] ext [phone]. Christine Rocha will be faxing denial letter.   Thank you.

## 2016-05-26 ENCOUNTER — Encounter: Payer: Self-pay | Admitting: Internal Medicine

## 2016-05-26 ENCOUNTER — Other Ambulatory Visit: Payer: Self-pay | Admitting: Internal Medicine

## 2016-05-26 ENCOUNTER — Ambulatory Visit: Payer: BLUE CROSS/BLUE SHIELD | Attending: Internal Medicine | Admitting: Internal Medicine

## 2016-05-26 VITALS — BP 153/77 | HR 88 | Temp 98.4°F | Resp 18 | Ht 63.0 in | Wt 196.6 lb

## 2016-05-26 DIAGNOSIS — Z1211 Encounter for screening for malignant neoplasm of colon: Secondary | ICD-10-CM | POA: Diagnosis not present

## 2016-05-26 DIAGNOSIS — R569 Unspecified convulsions: Secondary | ICD-10-CM | POA: Diagnosis not present

## 2016-05-26 DIAGNOSIS — R7303 Prediabetes: Secondary | ICD-10-CM | POA: Insufficient documentation

## 2016-05-26 DIAGNOSIS — Z1231 Encounter for screening mammogram for malignant neoplasm of breast: Secondary | ICD-10-CM

## 2016-05-26 DIAGNOSIS — I10 Essential (primary) hypertension: Secondary | ICD-10-CM | POA: Diagnosis not present

## 2016-05-26 DIAGNOSIS — M1712 Unilateral primary osteoarthritis, left knee: Secondary | ICD-10-CM | POA: Diagnosis not present

## 2016-05-26 DIAGNOSIS — F17200 Nicotine dependence, unspecified, uncomplicated: Secondary | ICD-10-CM | POA: Diagnosis not present

## 2016-05-26 LAB — POCT GLYCOSYLATED HEMOGLOBIN (HGB A1C): HEMOGLOBIN A1C: 6.8

## 2016-05-26 MED ORDER — AMLODIPINE BESYLATE 10 MG PO TABS: 10.0000 mg | ORAL_TABLET | Freq: Every day | ORAL | 3 refills | Status: DC

## 2016-05-26 MED ORDER — POTASSIUM CHLORIDE ER 20 MEQ PO TBCR: 20.0000 meq | EXTENDED_RELEASE_TABLET | Freq: Every day | ORAL | 3 refills | Status: DC

## 2016-05-26 MED ORDER — BUPROPION HCL ER (SMOKING DET) 150 MG PO TB12: 150.0000 mg | ORAL_TABLET | Freq: Two times a day (BID) | ORAL | 5 refills | Status: DC

## 2016-05-26 MED ORDER — ATENOLOL-CHLORTHALIDONE 100-25 MG PO TABS: 1.0000 | ORAL_TABLET | Freq: Every day | ORAL | 3 refills | Status: DC

## 2016-05-26 NOTE — Patient Instructions (Addendum)
Hypertension Hypertension, commonly called high blood pressure, is when the force of blood pumping through the arteries is too strong. The arteries are the blood vessels that carry blood from the heart throughout the body. Hypertension forces the heart to work harder to pump blood and may cause arteries to become narrow or stiff. Having untreated or uncontrolled hypertension can cause heart attacks, strokes, kidney disease, and other problems. A blood pressure reading consists of a higher number over a lower number. Ideally, your blood pressure should be below 120/80. The first ("top") number is called the systolic pressure. It is a measure of the pressure in your arteries as your heart beats. The second ("bottom") number is called the diastolic pressure. It is a measure of the pressure in your arteries as the heart relaxes. What are the causes? The cause of this condition is not known. What increases the risk? Some risk factors for high blood pressure are under your control. Others are not. Factors you can change   Smoking.  Having type 2 diabetes mellitus, high cholesterol, or both.  Not getting enough exercise or physical activity.  Being overweight.  Having too much fat, sugar, calories, or salt (sodium) in your diet.  Drinking too much alcohol. Factors that are difficult or impossible to change   Having chronic kidney disease.  Having a family history of high blood pressure.  Age. Risk increases with age.  Race. You may be at higher risk if you are African-American.  Gender. Men are at higher risk than women before age 45. After age 65, women are at higher risk than men.  Having obstructive sleep apnea.  Stress. What are the signs or symptoms? Extremely high blood pressure (hypertensive crisis) may cause:  Headache.  Anxiety.  Shortness of breath.  Nosebleed.  Nausea and vomiting.  Severe chest pain.  Jerky movements you cannot control (seizures). How is this  diagnosed? This condition is diagnosed by measuring your blood pressure while you are seated, with your arm resting on a surface. The cuff of the blood pressure monitor will be placed directly against the skin of your upper arm at the level of your heart. It should be measured at least twice using the same arm. Certain conditions can cause a difference in blood pressure between your right and left arms. Certain factors can cause blood pressure readings to be lower or higher than normal (elevated) for a short period of time:  When your blood pressure is higher when you are in a health care provider's office than when you are at home, this is called white coat hypertension. Most people with this condition do not need medicines.  When your blood pressure is higher at home than when you are in a health care provider's office, this is called masked hypertension. Most people with this condition may need medicines to control blood pressure. If you have a high blood pressure reading during one visit or you have normal blood pressure with other risk factors:  You may be asked to return on a different day to have your blood pressure checked again.  You may be asked to monitor your blood pressure at home for 1 week or longer. If you are diagnosed with hypertension, you may have other blood or imaging tests to help your health care provider understand your overall risk for other conditions. How is this treated? This condition is treated by making healthy lifestyle changes, such as eating healthy foods, exercising more, and reducing your alcohol intake. Your health   care provider may prescribe medicine if lifestyle changes are not enough to get your blood pressure under control, and if:  Your systolic blood pressure is above 130.  Your diastolic blood pressure is above 80. Your personal target blood pressure may vary depending on your medical conditions, your age, and other factors. Follow these instructions  at home: Eating and drinking   Eat a diet that is high in fiber and potassium, and low in sodium, added sugar, and fat. An example eating plan is called the DASH (Dietary Approaches to Stop Hypertension) diet. To eat this way:  Eat plenty of fresh fruits and vegetables. Try to fill half of your plate at each meal with fruits and vegetables.  Eat whole grains, such as whole wheat pasta, brown rice, or whole grain bread. Fill about one quarter of your plate with whole grains.  Eat or drink low-fat dairy products, such as skim milk or low-fat yogurt.  Avoid fatty cuts of meat, processed or cured meats, and poultry with skin. Fill about one quarter of your plate with lean proteins, such as fish, chicken without skin, beans, eggs, and tofu.  Avoid premade and processed foods. These tend to be higher in sodium, added sugar, and fat.  Reduce your daily sodium intake. Most people with hypertension should eat less than 1,500 mg of sodium a day.  Limit alcohol intake to no more than 1 drink a day for nonpregnant women and 2 drinks a day for men. One drink equals 12 oz of beer, 5 oz of wine, or 1 oz of hard liquor. Lifestyle   Work with your health care provider to maintain a healthy body weight or to lose weight. Ask what an ideal weight is for you.  Get at least 30 minutes of exercise that causes your heart to beat faster (aerobic exercise) most days of the week. Activities may include walking, swimming, or biking.  Include exercise to strengthen your muscles (resistance exercise), such as pilates or lifting weights, as part of your weekly exercise routine. Try to do these types of exercises for 30 minutes at least 3 days a week.  Do not use any products that contain nicotine or tobacco, such as cigarettes and e-cigarettes. If you need help quitting, ask your health care provider.  Monitor your blood pressure at home as told by your health care provider.  Keep all follow-up visits as told by  your health care provider. This is important. Medicines   Take over-the-counter and prescription medicines only as told by your health care provider. Follow directions carefully. Blood pressure medicines must be taken as prescribed.  Do not skip doses of blood pressure medicine. Doing this puts you at risk for problems and can make the medicine less effective.  Ask your health care provider about side effects or reactions to medicines that you should watch for. Contact a health care provider if:  You think you are having a reaction to a medicine you are taking.  You have headaches that keep coming back (recurring).  You feel dizzy.  You have swelling in your ankles.  You have trouble with your vision. Get help right away if:  You develop a severe headache or confusion.  You have unusual weakness or numbness.  You feel faint.  You have severe pain in your chest or abdomen.  You vomit repeatedly.  You have trouble breathing. Summary  Hypertension is when the force of blood pumping through your arteries is too strong. If this condition is   not controlled, it may put you at risk for serious complications.  Your personal target blood pressure may vary depending on your medical conditions, your age, and other factors. For most people, a normal blood pressure is less than 120/80.  Hypertension is treated with lifestyle changes, medicines, or a combination of both. Lifestyle changes include weight loss, eating a healthy, low-sodium diet, exercising more, and limiting alcohol. This information is not intended to replace advice given to you by your health care provider. Make sure you discuss any questions you have with your health care provider. Document Released: 12/28/2004 Document Revised: 11/26/2015 Document Reviewed: 11/26/2015 Elsevier Interactive Patient Education  2017 [place] DASH stands for "Dietary Approaches to Stop Hypertension." The DASH eating  plan is a healthy eating plan that has been shown to reduce high blood pressure (hypertension). It may also reduce your risk for type 2 diabetes, heart disease, and stroke. The DASH eating plan may also help with weight loss. What are tips for following this plan? General guidelines   Avoid eating more than 2,300 mg (milligrams) of salt (sodium) a day. If you have hypertension, you may need to reduce your sodium intake to 1,500 mg a day.  Limit alcohol intake to no more than 1 drink a day for nonpregnant women and 2 drinks a day for men. One drink equals 12 oz of beer, 5 oz of wine, or 1 oz of hard liquor.  Work with your health care provider to maintain a healthy body weight or to lose weight. Ask what an ideal weight is for you.  Get at least 30 minutes of exercise that causes your heart to beat faster (aerobic exercise) most days of the week. Activities may include walking, swimming, or biking.  Work with your health care provider or diet and nutrition specialist (dietitian) to adjust your eating plan to your individual calorie needs. Reading food labels   Check food labels for the amount of sodium per serving. Choose foods with less than 5 percent of the Daily Value of sodium. Generally, foods with less than 300 mg of sodium per serving fit into this eating plan.  To find whole grains, look for the word "whole" as the first word in the ingredient list. Shopping   Buy products labeled as "low-sodium" or "no salt added."  Buy fresh foods. Avoid canned foods and premade or frozen meals. Cooking   Avoid adding salt when cooking. Use salt-free seasonings or herbs instead of table salt or sea salt. Check with your health care provider or pharmacist before using salt substitutes.  Do not fry foods. Cook foods using healthy methods such as baking, boiling, grilling, and broiling instead.  Cook with heart-healthy oils, such as olive, canola, soybean, or sunflower oil. Meal planning     Eat a balanced diet that includes:  5 or more servings of fruits and vegetables each day. At each meal, try to fill half of your plate with fruits and vegetables.  Up to 6-8 servings of whole grains each day.  Less than 6 oz of lean meat, poultry, or fish each day. A 3-oz serving of meat is about the same size as a deck of cards. One egg equals 1 oz.  2 servings of low-fat dairy each day.  A serving of nuts, seeds, or beans 5 times each week.  Heart-healthy fats. Healthy fats called Omega-3 fatty acids are found in foods such as flaxseeds and coldwater fish, like sardines, salmon, and mackerel.  Limit how much you eat of the following:  Canned or prepackaged foods.  Food that is high in trans fat, such as fried foods.  Food that is high in saturated fat, such as fatty meat.  Sweets, desserts, sugary drinks, and other foods with added sugar.  Full-fat dairy products.  Do not salt foods before eating.  Try to eat at least 2 vegetarian meals each week.  Eat more home-cooked food and less restaurant, buffet, and fast food.  When eating at a restaurant, ask that your food be prepared with less salt or no salt, if possible. What foods are recommended? The items listed may not be a complete list. Talk with your dietitian about what dietary choices are best for you. Grains  Whole-grain or whole-wheat bread. Whole-grain or whole-wheat pasta. Brown rice. [place]. Bulgur. Whole-grain and low-sodium cereals. Pita bread. Low-fat, low-sodium crackers. Whole-wheat flour tortillas. Vegetables  Fresh or frozen vegetables (raw, steamed, roasted, or grilled). Low-sodium or reduced-sodium tomato and vegetable juice. Low-sodium or reduced-sodium tomato sauce and tomato paste. Low-sodium or reduced-sodium canned vegetables. Fruits  All fresh, dried, or frozen fruit. Canned fruit in natural juice (without added sugar). Meat and other protein foods  Skinless chicken or [place]. Ground  chicken or [place]. Pork with fat trimmed off. Fish and seafood. Egg whites. Dried beans, peas, or lentils. Unsalted nuts, nut butters, and seeds. Unsalted canned beans. Lean cuts of beef with fat trimmed off. Low-sodium, lean deli meat. Dairy  Low-fat (1%) or fat-free (skim) milk. Fat-free, low-fat, or reduced-fat cheeses. Nonfat, low-sodium ricotta or cottage cheese. Low-fat or nonfat yogurt. Low-fat, low-sodium cheese. Fats and oils  Soft margarine without trans fats. Vegetable oil. Low-fat, reduced-fat, or light mayonnaise and salad dressings (reduced-sodium). Canola, safflower, olive, soybean, and sunflower oils. Avocado. Seasoning and other foods  Herbs. Spices. Seasoning mixes without salt. Unsalted popcorn and pretzels. Fat-free sweets. What foods are not recommended? The items listed may not be a complete list. Talk with your dietitian about what dietary choices are best for you. Grains  Baked goods made with fat, such as croissants, muffins, or some breads. Dry pasta or rice meal packs. Vegetables  Creamed or fried vegetables. Vegetables in a cheese sauce. Regular canned vegetables (not low-sodium or reduced-sodium). Regular canned tomato sauce and paste (not low-sodium or reduced-sodium). Regular tomato and vegetable juice (not low-sodium or reduced-sodium). Angie Fava. Olives. Fruits  Canned fruit in a light or heavy syrup. Fried fruit. Fruit in cream or butter sauce. Meat and other protein foods  Fatty cuts of meat. Ribs. Fried meat. Berniece Salines. Sausage. Bologna and other processed lunch meats. Salami. Fatback. Hotdogs. Bratwurst. Salted nuts and seeds. Canned beans with added salt. Canned or smoked fish. Whole eggs or egg yolks. Chicken or [place] with skin. Dairy  Whole or 2% milk, cream, and half-and-half. Whole or full-fat cream cheese. Whole-fat or sweetened yogurt. Full-fat cheese. Nondairy creamers. Whipped toppings. Processed cheese and cheese spreads. Fats and oils  Butter. Stick  margarine. Lard. Shortening. Ghee. Bacon fat. Tropical oils, such as coconut, palm kernel, or palm oil. Seasoning and other foods  Salted popcorn and pretzels. Onion salt, garlic salt, seasoned salt, table salt, and sea salt. Worcestershire sauce. Tartar sauce. Barbecue sauce. Teriyaki sauce. Soy sauce, including reduced-sodium. Steak sauce. Canned and packaged gravies. Fish sauce. Oyster sauce. Cocktail sauce. Horseradish that you find on the shelf. Ketchup. Mustard. Meat flavorings and tenderizers. Bouillon cubes. Hot sauce and Tabasco sauce. Premade or packaged marinades. Premade or packaged taco seasonings. Relishes.  Regular salad dressings. Where to find more information:  National Heart, Lung, and [place]: https://wilson-eaton.com/  American Heart Association: www.heart.org Summary  The DASH eating plan is a healthy eating plan that has been shown to reduce high blood pressure (hypertension). It may also reduce your risk for type 2 diabetes, heart disease, and stroke.  With the DASH eating plan, you should limit salt (sodium) intake to 2,300 mg a day. If you have hypertension, you may need to reduce your sodium intake to 1,500 mg a day.  When on the DASH eating plan, aim to eat more fresh fruits and vegetables, whole grains, lean proteins, low-fat dairy, and heart-healthy fats.  Work with your health care provider or diet and nutrition specialist (dietitian) to adjust your eating plan to your individual calorie needs. This information is not intended to replace advice given to you by your health care provider. Make sure you discuss any questions you have with your health care provider. Document Released: 12/17/2010 Document Revised: 12/22/2015 Document Reviewed: 12/22/2015 Elsevier Interactive Patient Education  2017 [employer].

## 2016-05-26 NOTE — Progress Notes (Signed)
Patient is here for HTN FU  Patient denies pain at this time.  Patient has not had BP medication in 4 weeks. Patient has taken vitamins today. Patient has eaten today.

## 2016-05-26 NOTE — Progress Notes (Signed)
Christine Rocha, is a 61 y.o. female  [id number]  [id number]  DOB - 1955-10-04  Chief Complaint  Patient presents with  . Hypertension      Subjective:   Christine Rocha is a 61 y.o. female with history of HTN, Smoking addiction, and Prediabetes here today for a follow up visit and medication refill. Patient ran out of her BP medication over 4 weeks, did not call our office for a refill but came in today instead. She has no significant complaint. She is due for pap smear, mammogram and colonoscopy. She denies being depressed. She denies suicidal ideations or thoughts. She plans to follow dash diet although she indulges in a little bit too much salt in her diet before today. Patient has No headache, No chest pain, No abdominal pain - No Nausea, No new weakness tingling or numbness, No Cough - SOB.  No problems updated.  ALLERGIES: Allergies  Allergen Reactions  . Penicillins    PAST MEDICAL HISTORY: Past Medical History:  Diagnosis Date  . Hypertension   . Seizures ([place])    MEDICATIONS AT HOME: Prior to Admission medications   Medication Sig Start Date End Date Taking? Authorizing Provider  amLODipine (NORVASC) 10 MG tablet Take 1 tablet (10 mg total) by mouth daily. 05/26/16  Yes Leo Weyandt, Marlena Clipper, MD  atenolol-chlorthalidone (TENORETIC 100) 100-25 MG tablet Take 1 tablet by mouth daily. 05/26/16  Yes Tresa Garter, MD  ibuprofen (ADVIL,MOTRIN) 400 MG tablet Take 1 tablet (400 mg total) by mouth every 6 (six) hours as needed for fever or moderate pain. 10/30/14  Yes Tresa Garter, MD  Potassium Chloride ER 20 MEQ TBCR Take 20 mEq by mouth daily. 05/26/16  Yes Tresa Garter, MD  buPROPion (ZYBAN) 150 MG 12 hr tablet Take 1 tablet (150 mg total) by mouth 2 (two) times daily. 05/26/16   Tresa Garter, MD  nicotine (NICODERM CQ - DOSED IN MG/24 HOURS) 14 mg/24hr patch Place 1 patch (14 mg total) onto the skin daily. Patient not taking: Reported on  10/28/2014 11/20/12   Robbie Lis, MD   Objective:   Vitals:   05/26/16 0859  BP: (!) 153/77  Pulse: 88  Resp: 18  Temp: 98.4 F (36.9 C)  TempSrc: Oral  SpO2: 97%  Weight: 196 lb 9.6 oz (89.2 kg)  Height: '5\' 3"'  (1.6 m)   Exam General appearance : Awake, alert, not in any distress. Speech Clear. Not toxic looking, obese HEENT: Atraumatic and Normocephalic, pupils equally reactive to light and accomodation Neck: Supple, no JVD. No cervical lymphadenopathy.  Chest: Good air entry bilaterally, no added sounds  CVS: S1 S2 regular, no murmurs.  Abdomen: Bowel sounds present, Non tender and not distended with no gaurding, rigidity or rebound. Extremities: B/L Lower Ext shows no edema, both legs are warm to touch Neurology: Awake alert, and oriented X 3, CN II-XII intact, Non focal Skin: No Rash  Data Review Lab Results  Component Value Date   HGBA1C 6.3 09/13/2012    Assessment & Plan   1. Essential hypertension  - amLODipine (NORVASC) 10 MG tablet; Take 1 tablet (10 mg total) by mouth daily.  Dispense: 90 tablet; Refill: 3 - atenolol-chlorthalidone (TENORETIC 100) 100-25 MG tablet; Take 1 tablet by mouth daily.  Dispense: 90 tablet; Refill: 3 - CMP14+EGFR  2. Smoking addiction  Telesa was counseled on the dangers of tobacco use, and was advised to quit. Reviewed strategies to maximize success, including removing cigarettes and smoking  materials from environment, stress management and support of family/friends.  3. Prediabetes  Aim for 30 minutes of exercise most days. Rethink what you drink. Water is great! Aim for 2-3 Carb Choices per meal (30-45 grams) +/- 1 either way  Aim for 0-15 Carbs per snack if hungry  Include protein in moderation with your meals and snacks  Consider reading food labels for Total Carbohydrate and Fat Grams of foods  Be mindful about how much sugar you are adding to beverages and other foods. Fruit Punch - find one with no sugar  Measure  and decrease portions of carbohydrate foods  Make your plate and don't go back for seconds  4. Primary osteoarthritis of left knee -Continue PT and supportive therapy  5. Encounter for screening mammogram for breast cancer  - MM Digital Screening; Future  6. Screening for colon cancer  - Ambulatory referral to Gastroenterology  Patient have been counseled extensively about nutrition and exercise. Other issues discussed during this visit include: low cholesterol diet, weight control and daily exercise, importance of adherence with medications and regular follow-up. We also discussed long term complications of uncontrolled hypertension.   Return in about 4 weeks (around 06/23/2016) for Pap Smear Only, in the morning.  The patient was given clear instructions to go to ER or return to medical center if symptoms don't improve, worsen or new problems develop. The patient verbalized understanding. The patient was told to call to get lab results if they haven't heard anything in the next week.   This note has been created with Surveyor, quantity. Any transcriptional errors are unintentional.    Angelica Chessman, MD, [place], [facility]arilyn [place], [facility] [place], [place]   05/26/2016, 9:24 AM

## 2016-05-27 LAB — CMP14+EGFR
ALBUMIN: 4.2 g/dL (ref 3.5–5.5)
ALK PHOS: 73 IU/L (ref 39–117)
ALT: 25 IU/L (ref 0–32)
AST: 16 IU/L (ref 0–40)
Albumin/Globulin Ratio: 1.6 (ref 1.2–2.2)
BUN / CREAT RATIO: 8 — AB (ref 9–23)
BUN: 7 mg/dL (ref 6–24)
CHLORIDE: 104 mmol/L (ref 96–106)
CO2: 25 mmol/L (ref 18–29)
CREATININE: 0.9 mg/dL (ref 0.57–1.00)
Calcium: 9.6 mg/dL (ref 8.7–10.2)
GFR calc Af Amer: 82 mL/min/{1.73_m2} (ref >59)
GFR calc non Af Amer: 71 mL/min/{1.73_m2} (ref >59)
Globulin, Total: 2.6 g/dL (ref 1.5–4.5)
Glucose: 110 mg/dL — ABNORMAL HIGH (ref 65–99)
Potassium: 3.8 mmol/L (ref 3.5–5.2)
Sodium: 144 mmol/L (ref 134–144)
Total Protein: 6.8 g/dL (ref 6.0–8.5)

## 2016-06-09 ENCOUNTER — Telehealth (INDEPENDENT_AMBULATORY_CARE_PROVIDER_SITE_OTHER): Payer: Self-pay | Admitting: *Deleted

## 2016-06-09 NOTE — Telephone Encounter (Signed)
!!!  Please inform the patient of labs being normal!!!

## 2016-06-09 NOTE — Telephone Encounter (Signed)
-----   Message from Tresa Garter, MD sent at 06/01/2016 10:31 AM EDT ----- Lab result is normal

## 2016-06-16 ENCOUNTER — Ambulatory Visit
Admission: RE | Admit: 2016-06-16 | Discharge: 2016-06-16 | Disposition: A | Discharge status: Home / Self Care | Payer: BLUE CROSS/BLUE SHIELD | Source: Ambulatory Visit | Attending: Internal Medicine | Admitting: Internal Medicine

## 2016-06-16 DIAGNOSIS — Z1231 Encounter for screening mammogram for malignant neoplasm of breast: Secondary | ICD-10-CM

## 2016-06-30 ENCOUNTER — Other Ambulatory Visit: Payer: Self-pay | Admitting: Internal Medicine

## 2016-07-08 ENCOUNTER — Telehealth: Payer: Self-pay | Admitting: *Deleted

## 2016-07-08 NOTE — Telephone Encounter (Signed)
Patient verified DOB Patient is aware of mammogram showing no evidence of malignancy and a recommended screening being completed in one year. Patient had no further questions at this time.

## 2016-07-08 NOTE — Telephone Encounter (Signed)
-----   Message from Tresa Garter, MD sent at 06/16/2016  1:36 PM EDT ----- Please inform patient that her screening mammogram shows no evidence of malignancy. Recommend screening mammogram in one year

## 2016-07-21 ENCOUNTER — Other Ambulatory Visit: Payer: Self-pay | Admitting: Internal Medicine

## 2016-12-01 ENCOUNTER — Ambulatory Visit (HOSPITAL_COMMUNITY)
Admission: EM | Admit: 2016-12-01 | Discharge: 2016-12-01 | Disposition: A | Discharge status: Home / Self Care | Payer: BLUE CROSS/BLUE SHIELD | Attending: Family Medicine | Admitting: Family Medicine

## 2016-12-01 ENCOUNTER — Encounter (HOSPITAL_COMMUNITY): Payer: Self-pay | Admitting: Emergency Medicine

## 2016-12-01 DIAGNOSIS — M62830 Muscle spasm of back: Secondary | ICD-10-CM

## 2016-12-01 MED ORDER — NAPROXEN 375 MG PO TABS: 375.0000 mg | ORAL_TABLET | Freq: Two times a day (BID) | ORAL | 0 refills | Status: DC

## 2016-12-01 NOTE — Discharge Instructions (Signed)
Apply heat to area affected, massage, range of motion exercises. Naproxen twice a day, take it with food. If no improvement of symptoms in the next week or if worsening please follow up with your primary care doctor. Please take your blood pressure medications.

## 2016-12-01 NOTE — ED Triage Notes (Signed)
Pt sts left shoulder pain  Into upper back; pt sts she feels a "knot" in that area

## 2016-12-01 NOTE — ED Provider Notes (Signed)
[place]    CSN: [id number] Arrival date & time: 12/01/16  1639     History   Chief Complaint Chief Complaint  Patient presents with  . Back Pain    HPI Christine Rocha is a 61 y.o. female.   Christine Rocha presents with complaints of left trapezius muscle soreness which has been present for weeks but worse over the past few days. She works in [employer] for the city of [employer]. She is right handed but does sleep on her left side. Rates pain 8/10. Has not taken any medications for her symptoms. States she is not taking any of her blood pressure medications. Pain is worse with raising her arm. She is able to feel area effected. No trauma or injury to the shoulder or neck, without pain to shoulder, elbow or wrist. Sensation intact.    ROS per HPI.       Past Medical History:  Diagnosis Date  . Hypertension   . Seizures [facility])     Patient Active Problem List   Diagnosis Date Noted  . Preventative health care 10/28/2014  . Encounter for screening mammogram for breast cancer 10/28/2014  . Primary osteoarthritis of left knee 10/28/2014  . Essential hypertension 11/15/2013  . Prediabetes 11/15/2013  . Uncontrolled hypertension 10/11/2012  . Smoking addiction 10/11/2012    History reviewed. No pertinent surgical history.  OB History    Gravida Para Term Preterm AB Living   3 0 0 0 3 0   SAB TAB Ectopic Multiple Live Births   3 0             Home Medications    Prior to Admission medications   Medication Sig Start Date End Date Taking? Authorizing Provider  amLODipine (NORVASC) 10 MG tablet Take 1 tablet (10 mg total) by mouth daily. 05/26/16   Tresa Garter, MD  atenolol-chlorthalidone (TENORETIC 100) 100-25 MG tablet Take 1 tablet by mouth daily. 05/26/16   Tresa Garter, MD  buPROPion (ZYBAN) 150 MG 12 hr tablet Take 1 tablet (150 mg total) by mouth 2 (two) times daily. 05/26/16   Tresa Garter, MD  ibuprofen (ADVIL,MOTRIN) 400 MG  tablet Take 1 tablet (400 mg total) by mouth every 6 (six) hours as needed for fever or moderate pain. 10/30/14   Tresa Garter, MD  naproxen (NAPROSYN) 375 MG tablet Take 1 tablet (375 mg total) by mouth 2 (two) times daily. 12/01/16   Zigmund Gottron, NP  nicotine (NICODERM CQ - DOSED IN MG/24 HOURS) 14 mg/24hr patch Place 1 patch (14 mg total) onto the skin daily. Patient not taking: Reported on 10/28/2014 11/20/12   Robbie Lis, MD  Potassium Chloride ER 20 MEQ TBCR Take 20 mEq by mouth daily. 05/26/16   Tresa Garter, MD    Family History History reviewed. No pertinent family history.  Social History Social History   Tobacco Use  . Smoking status: Current Every Day Smoker    Packs/day: 0.25    Types: Cigarettes  . Smokeless tobacco: Never Used  Substance Use Topics  . Alcohol use: No  . Drug use: No     Allergies   Penicillins   Review of Systems Review of Systems   Physical Exam Triage Vital Signs ED Triage Vitals [12/01/16 1707]  Enc Vitals Group     BP (!) 176/81     Pulse Rate 81     Resp 18     Temp 98.3 F (36.8 C)  Temp Source Oral     SpO2 99 %     Weight      Height      Head Circumference      Peak Flow      Pain Score 10     Pain Loc      Pain Edu?      Excl. in [place]?    No data found.  Updated Vital Signs BP (!) 176/81 (BP Location: Left Arm)   Pulse 81   Temp 98.3 F (36.8 C) (Oral)   Resp 18   SpO2 99%   Visual Acuity Right Eye Distance:   Left Eye Distance:   Bilateral Distance:    Right Eye Near:   Left Eye Near:    Bilateral Near:     Physical Exam  Constitutional: She is oriented to person, place, and time. She appears well-developed and well-nourished. No distress.  Cardiovascular: Normal rate, regular rhythm and normal heart sounds.  Pulmonary/Chest: Effort normal and breath sounds normal.  Musculoskeletal:       Left shoulder: She exhibits decreased range of motion, tenderness, swelling and spasm.  She exhibits no bony tenderness, no effusion, no crepitus, no deformity, no laceration, normal pulse and normal strength.       Arms: Palpable muscle spasm with tenderness and mild swelling; pain with raising of arm above head, otherwise strength equal to bilateral arms; passive ROM to left shoulder WNL; sensation intact  Neurological: She is alert and oriented to person, place, and time.  Skin: Skin is warm and dry.  Vitals reviewed.    UC Treatments / Results  Labs (all labs ordered are listed, but only abnormal results are displayed) Labs Reviewed - No data to display  EKG  EKG Interpretation None       Radiology No results found.  Procedures Procedures (including critical care time)  Medications Ordered in UC Medications - No data to display   Initial Impression / Assessment and Plan / UC Course  I have reviewed the triage vital signs and the nursing notes.  Pertinent labs & imaging results that were available during my care of the patient were reviewed by me and considered in my medical decision making (see chart for details).     Muscle spasm noted on physical exam. Supportive cares recommended. Naproxen bid with food. Heat and massage of area affected. Discussed trying sleeping on other side of body as well as this may be a cause. Encouraged patient to take bp meds as already prescribed. If symptoms worsen or do not improve in the next week to return to be seen or to follow up with PCP. Patient verbalized understanding and agreeable to plan.    Final Clinical Impressions(s) / UC Diagnoses   Final diagnoses:  Muscle spasm of back    ED Discharge Orders        Ordered    naproxen (NAPROSYN) 375 MG tablet  2 times daily     12/01/16 1736       Controlled Substance Prescriptions [place] Controlled Substance Registry consulted? Not Applicable   Zigmund Gottron, NP 12/01/16 1754

## 2017-04-19 DIAGNOSIS — K635 Polyp of colon: Secondary | ICD-10-CM | POA: Diagnosis not present

## 2017-04-19 DIAGNOSIS — Z1211 Encounter for screening for malignant neoplasm of colon: Secondary | ICD-10-CM | POA: Diagnosis not present

## 2017-04-22 DIAGNOSIS — K635 Polyp of colon: Secondary | ICD-10-CM | POA: Diagnosis not present

## 2017-04-23 ENCOUNTER — Inpatient Hospital Stay (HOSPITAL_COMMUNITY)
Admission: EM | Admit: 2017-04-23 | Discharge: 2017-04-28 | DRG: 482 | Disposition: A | Discharge status: Skilled Nursing Facility | Payer: BLUE CROSS/BLUE SHIELD | Attending: Internal Medicine | Admitting: Internal Medicine

## 2017-04-23 ENCOUNTER — Encounter (HOSPITAL_COMMUNITY): Payer: Self-pay | Admitting: Emergency Medicine

## 2017-04-23 ENCOUNTER — Other Ambulatory Visit: Payer: Self-pay

## 2017-04-23 ENCOUNTER — Emergency Department (HOSPITAL_COMMUNITY): Payer: BLUE CROSS/BLUE SHIELD

## 2017-04-23 DIAGNOSIS — F17210 Nicotine dependence, cigarettes, uncomplicated: Secondary | ICD-10-CM | POA: Diagnosis present

## 2017-04-23 DIAGNOSIS — W01198A Fall on same level from slipping, tripping and stumbling with subsequent striking against other object, initial encounter: Secondary | ICD-10-CM | POA: Diagnosis not present

## 2017-04-23 DIAGNOSIS — W19XXXA Unspecified fall, initial encounter: Secondary | ICD-10-CM

## 2017-04-23 DIAGNOSIS — S8991XA Unspecified injury of right lower leg, initial encounter: Secondary | ICD-10-CM | POA: Diagnosis not present

## 2017-04-23 DIAGNOSIS — M6281 Muscle weakness (generalized): Secondary | ICD-10-CM | POA: Diagnosis not present

## 2017-04-23 DIAGNOSIS — E119 Type 2 diabetes mellitus without complications: Secondary | ICD-10-CM | POA: Diagnosis present

## 2017-04-23 DIAGNOSIS — S99912A Unspecified injury of left ankle, initial encounter: Secondary | ICD-10-CM | POA: Diagnosis not present

## 2017-04-23 DIAGNOSIS — Z01818 Encounter for other preprocedural examination: Secondary | ICD-10-CM | POA: Diagnosis not present

## 2017-04-23 DIAGNOSIS — Y93E9 Activity, other interior property and clothing maintenance: Secondary | ICD-10-CM

## 2017-04-23 DIAGNOSIS — S72492A Other fracture of lower end of left femur, initial encounter for closed fracture: Secondary | ICD-10-CM | POA: Diagnosis not present

## 2017-04-23 DIAGNOSIS — S52572A Other intraarticular fracture of lower end of left radius, initial encounter for closed fracture: Secondary | ICD-10-CM | POA: Diagnosis not present

## 2017-04-23 DIAGNOSIS — S72402A Unspecified fracture of lower end of left femur, initial encounter for closed fracture: Secondary | ICD-10-CM | POA: Diagnosis not present

## 2017-04-23 DIAGNOSIS — S79192A Other physeal fracture of lower end of left femur, initial encounter for closed fracture: Secondary | ICD-10-CM | POA: Diagnosis not present

## 2017-04-23 DIAGNOSIS — G8911 Acute pain due to trauma: Secondary | ICD-10-CM | POA: Diagnosis not present

## 2017-04-23 DIAGNOSIS — S72413R Displaced unspecified condyle fracture of lower end of unspecified femur, subsequent encounter for open fracture type IIIA, IIIB, or IIIC with malunion: Secondary | ICD-10-CM | POA: Diagnosis not present

## 2017-04-23 DIAGNOSIS — Y92000 Kitchen of unspecified non-institutional (private) residence as  the place of occurrence of the external cause: Secondary | ICD-10-CM | POA: Diagnosis not present

## 2017-04-23 DIAGNOSIS — I10 Essential (primary) hypertension: Secondary | ICD-10-CM | POA: Diagnosis present

## 2017-04-23 DIAGNOSIS — S72462A Displaced supracondylar fracture with intracondylar extension of lower end of left femur, initial encounter for closed fracture: Secondary | ICD-10-CM | POA: Diagnosis not present

## 2017-04-23 DIAGNOSIS — G8918 Other acute postprocedural pain: Secondary | ICD-10-CM | POA: Diagnosis not present

## 2017-04-23 DIAGNOSIS — W010XXA Fall on same level from slipping, tripping and stumbling without subsequent striking against object, initial encounter: Secondary | ICD-10-CM | POA: Diagnosis present

## 2017-04-23 DIAGNOSIS — Z79899 Other long term (current) drug therapy: Secondary | ICD-10-CM | POA: Diagnosis not present

## 2017-04-23 DIAGNOSIS — E876 Hypokalemia: Secondary | ICD-10-CM

## 2017-04-23 DIAGNOSIS — T148XXA Other injury of unspecified body region, initial encounter: Secondary | ICD-10-CM | POA: Diagnosis not present

## 2017-04-23 DIAGNOSIS — R262 Difficulty in walking, not elsewhere classified: Secondary | ICD-10-CM | POA: Diagnosis not present

## 2017-04-23 DIAGNOSIS — S72352A Displaced comminuted fracture of shaft of left femur, initial encounter for closed fracture: Secondary | ICD-10-CM | POA: Diagnosis not present

## 2017-04-23 DIAGNOSIS — W1830XA Fall on same level, unspecified, initial encounter: Secondary | ICD-10-CM | POA: Diagnosis not present

## 2017-04-23 DIAGNOSIS — Y92230 Patient room in hospital as the place of occurrence of the external cause: Secondary | ICD-10-CM | POA: Diagnosis not present

## 2017-04-23 DIAGNOSIS — S52502A Unspecified fracture of the lower end of left radius, initial encounter for closed fracture: Secondary | ICD-10-CM | POA: Diagnosis not present

## 2017-04-23 DIAGNOSIS — S72452A Displaced supracondylar fracture without intracondylar extension of lower end of left femur, initial encounter for closed fracture: Principal | ICD-10-CM | POA: Diagnosis present

## 2017-04-23 DIAGNOSIS — Z7984 Long term (current) use of oral hypoglycemic drugs: Secondary | ICD-10-CM | POA: Diagnosis not present

## 2017-04-23 DIAGNOSIS — Z419 Encounter for procedure for purposes other than remedying health state, unspecified: Secondary | ICD-10-CM

## 2017-04-23 DIAGNOSIS — M79642 Pain in left hand: Secondary | ICD-10-CM

## 2017-04-23 DIAGNOSIS — M79605 Pain in left leg: Secondary | ICD-10-CM | POA: Diagnosis not present

## 2017-04-23 DIAGNOSIS — M25552 Pain in left hip: Secondary | ICD-10-CM | POA: Diagnosis not present

## 2017-04-23 DIAGNOSIS — Y9301 Activity, walking, marching and hiking: Secondary | ICD-10-CM | POA: Diagnosis not present

## 2017-04-23 DIAGNOSIS — S7292XA Unspecified fracture of left femur, initial encounter for closed fracture: Secondary | ICD-10-CM | POA: Diagnosis present

## 2017-04-23 DIAGNOSIS — S79912A Unspecified injury of left hip, initial encounter: Secondary | ICD-10-CM | POA: Diagnosis not present

## 2017-04-23 DIAGNOSIS — M1990 Unspecified osteoarthritis, unspecified site: Secondary | ICD-10-CM | POA: Diagnosis not present

## 2017-04-23 DIAGNOSIS — M1712 Unilateral primary osteoarthritis, left knee: Secondary | ICD-10-CM | POA: Diagnosis present

## 2017-04-23 DIAGNOSIS — Z880 Allergy status to penicillin: Secondary | ICD-10-CM

## 2017-04-23 DIAGNOSIS — S52515A Nondisplaced fracture of left radial styloid process, initial encounter for closed fracture: Secondary | ICD-10-CM | POA: Diagnosis not present

## 2017-04-23 DIAGNOSIS — M25562 Pain in left knee: Secondary | ICD-10-CM | POA: Diagnosis not present

## 2017-04-23 LAB — BASIC METABOLIC PANEL
ANION GAP: 12 (ref 5–15)
BUN: <5 mg/dL — ABNORMAL LOW (ref 6–20)
CALCIUM: 9.1 mg/dL (ref 8.9–10.3)
CO2: 26 mmol/L (ref 22–32)
Chloride: 102 mmol/L (ref 101–111)
Creatinine, Ser: 0.79 mg/dL (ref 0.44–1.00)
GFR calc Af Amer: >60 mL/min (ref >60)
GLUCOSE: 149 mg/dL — AB (ref 65–99)
Potassium: 2.8 mmol/L — ABNORMAL LOW (ref 3.5–5.1)
SODIUM: 140 mmol/L (ref 135–145)

## 2017-04-23 LAB — CBC
HCT: 38 % (ref 36.0–46.0)
HEMOGLOBIN: 12.4 g/dL (ref 12.0–15.0)
MCH: 29 pg (ref 26.0–34.0)
MCHC: 32.6 g/dL (ref 30.0–36.0)
MCV: 88.8 fL (ref 78.0–100.0)
Platelets: 291 10*3/uL (ref 150–400)
RBC: 4.28 MIL/uL (ref 3.87–5.11)
RDW: 13.7 % (ref 11.5–15.5)
WBC: 10.6 10*3/uL — ABNORMAL HIGH (ref 4.0–10.5)

## 2017-04-23 MED ORDER — AMLODIPINE BESYLATE 10 MG PO TABS: 10.0000 mg | ORAL_TABLET | Freq: Every day | ORAL | Status: DC

## 2017-04-23 MED ORDER — ENOXAPARIN SODIUM 40 MG/0.4ML ~~LOC~~ SOLN
40.0000 mg | SUBCUTANEOUS | Status: DC
  Administered 2017-04-24 – 2017-04-27 (×3): 40 mg via SUBCUTANEOUS
  Filled 2017-04-23 (×4): qty 0.4

## 2017-04-23 MED ORDER — INSULIN ASPART 100 UNIT/ML ~~LOC~~ SOLN
0.0000 [IU] | Freq: Every day | SUBCUTANEOUS | Status: DC
  Administered 2017-04-24: 3 [IU] via SUBCUTANEOUS

## 2017-04-23 MED ORDER — ONDANSETRON HCL 4 MG/2ML IJ SOLN
4.0000 mg | Freq: Once | INTRAMUSCULAR | Status: AC
  Administered 2017-04-23: 4 mg via INTRAVENOUS
  Filled 2017-04-23: qty 2

## 2017-04-23 MED ORDER — BUPROPION HCL ER (SR) 150 MG PO TB12
150.0000 mg | ORAL_TABLET | Freq: Two times a day (BID) | ORAL | Status: DC
  Administered 2017-04-23 – 2017-04-28 (×9): 150 mg via ORAL
  Filled 2017-04-23 (×12): qty 1

## 2017-04-23 MED ORDER — ATENOLOL-CHLORTHALIDONE 100-25 MG PO TABS: 1.0000 | ORAL_TABLET | Freq: Every day | ORAL | Status: DC

## 2017-04-23 MED ORDER — ACETAMINOPHEN 325 MG PO TABS
650.0000 mg | ORAL_TABLET | Freq: Four times a day (QID) | ORAL | Status: DC | PRN
  Administered 2017-04-26 – 2017-04-28 (×2): 650 mg via ORAL
  Filled 2017-04-23 (×2): qty 2

## 2017-04-23 MED ORDER — SENNOSIDES-DOCUSATE SODIUM 8.6-50 MG PO TABS: 1.0000 | ORAL_TABLET | Freq: Every evening | ORAL | Status: DC | PRN

## 2017-04-23 MED ORDER — CHLORTHALIDONE 25 MG PO TABS
25.0000 mg | ORAL_TABLET | Freq: Every day | ORAL | Status: DC
  Filled 2017-04-23: qty 1

## 2017-04-23 MED ORDER — INSULIN ASPART 100 UNIT/ML ~~LOC~~ SOLN
0.0000 [IU] | Freq: Three times a day (TID) | SUBCUTANEOUS | Status: DC
  Administered 2017-04-25: 5 [IU] via SUBCUTANEOUS
  Administered 2017-04-25 – 2017-04-28 (×4): 1 [IU] via SUBCUTANEOUS

## 2017-04-23 MED ORDER — ENOXAPARIN SODIUM 40 MG/0.4ML ~~LOC~~ SOLN: 40.0000 mg | SUBCUTANEOUS | Status: DC

## 2017-04-23 MED ORDER — HYDROMORPHONE HCL 2 MG/ML IJ SOLN
1.0000 mg | Freq: Once | INTRAMUSCULAR | Status: AC
  Administered 2017-04-23: 1 mg via INTRAVENOUS
  Filled 2017-04-23: qty 1

## 2017-04-23 MED ORDER — OXYCODONE HCL 5 MG PO TABS
5.0000 mg | ORAL_TABLET | Freq: Four times a day (QID) | ORAL | Status: DC | PRN
  Administered 2017-04-24: 5 mg via ORAL
  Filled 2017-04-23: qty 1

## 2017-04-23 MED ORDER — POTASSIUM CHLORIDE CRYS ER 20 MEQ PO TBCR
40.0000 meq | EXTENDED_RELEASE_TABLET | Freq: Once | ORAL | Status: AC
  Administered 2017-04-23: 40 meq via ORAL
  Filled 2017-04-23: qty 2

## 2017-04-23 MED ORDER — OXYCODONE HCL 5 MG PO TABS: 5.0000 mg | ORAL_TABLET | ORAL | Status: DC | PRN

## 2017-04-23 MED ORDER — SODIUM CHLORIDE 0.9 % IV BOLUS
1000.0000 mL | Freq: Once | INTRAVENOUS | Status: AC
  Administered 2017-04-23: 1000 mL via INTRAVENOUS

## 2017-04-23 MED ORDER — ATENOLOL 50 MG PO TABS: 100.0000 mg | ORAL_TABLET | Freq: Every day | ORAL | Status: DC

## 2017-04-23 MED ORDER — HYDROMORPHONE HCL 1 MG/ML IJ SOLN
1.0000 mg | Freq: Once | INTRAMUSCULAR | Status: AC
  Administered 2017-04-23: 1 mg via INTRAVENOUS
  Filled 2017-04-23: qty 1

## 2017-04-23 MED ORDER — POTASSIUM CHLORIDE 10 MEQ/100ML IV SOLN
10.0000 meq | INTRAVENOUS | Status: AC
  Administered 2017-04-23 – 2017-04-24 (×4): 10 meq via INTRAVENOUS
  Filled 2017-04-23 (×4): qty 100

## 2017-04-23 MED ORDER — ACETAMINOPHEN 650 MG RE SUPP: 650.0000 mg | Freq: Four times a day (QID) | RECTAL | Status: DC | PRN

## 2017-04-23 NOTE — ED Notes (Signed)
Delay in lab draw,  MD at bedside. 

## 2017-04-23 NOTE — ED Notes (Addendum)
Charted on wrong pt.  Deleted note.

## 2017-04-23 NOTE — Consult Note (Signed)
ORTHOPAEDIC CONSULTATION  REQUESTING PHYSICIAN: Axel Filler, *  Chief Complaint: Left knee pain.  HPI: Christine Rocha is a 62 y.o. female who presents with segmental left supracondylar femur fracture.  Patient states she was cleaning at home [place] some polish on a rag she states she got some on the floor and slipped on the floor sustaining the segmental supracondylar femur fracture.  Past Medical History:  Diagnosis Date  . Hypertension   . Seizures ([place])    History reviewed. No pertinent surgical history. Social History   Socioeconomic History  . Marital status: Single    Spouse name: Not on file  . Number of children: Not on file  . Years of education: Not on file  . Highest education level: Not on file  Occupational History  . Not on file  Social Needs  . Financial resource strain: Not on file  . Food insecurity:    Worry: Not on file    Inability: Not on file  . Transportation needs:    Medical: Not on file    Non-medical: Not on file  Tobacco Use  . Smoking status: Current Every Day Smoker    Packs/day: 0.25    Types: Cigarettes  . Smokeless tobacco: Never Used  Substance and Sexual Activity  . Alcohol use: No  . Drug use: No  . Sexual activity: Never    Birth control/protection: None  Lifestyle  . Physical activity:    Days per week: Not on file    Minutes per session: Not on file  . Stress: Not on file  Relationships  . Social connections:    Talks on phone: Not on file    Gets together: Not on file    Attends religious service: Not on file    Active member of club or organization: Not on file    Attends meetings of clubs or organizations: Not on file    Relationship status: Not on file  Other Topics Concern  . Not on file  Social History Narrative  . Not on file   History reviewed. No pertinent family history. - negative except otherwise stated in the family history section Allergies  Allergen Reactions  . Penicillins     Prior to Admission medications   Medication Sig Start Date End Date Taking? Authorizing Provider  amLODipine (NORVASC) 10 MG tablet Take 1 tablet (10 mg total) by mouth daily. 05/26/16  Yes Jegede, Marlena Clipper, MD  atenolol-chlorthalidone (TENORETIC 100) 100-25 MG tablet Take 1 tablet by mouth daily. 05/26/16  Yes Tresa Garter, MD  buPROPion (ZYBAN) 150 MG 12 hr tablet Take 1 tablet (150 mg total) by mouth 2 (two) times daily. 05/26/16  Yes Tresa Garter, MD  carboxymethylcellulose (REFRESH PLUS) 0.5 % SOLN Place 1 drop into both eyes daily as needed (dry eyes).   Yes [provider]  ibuprofen (ADVIL,MOTRIN) 400 MG tablet Take 1 tablet (400 mg total) by mouth every 6 (six) hours as needed for fever or moderate pain. 10/30/14  Yes Tresa Garter, MD  Multiple Vitamins-Minerals (WOMENS 50+ MULTI VITAMIN/MIN) TABS Take 1 tablet by mouth daily.   Yes [provider]  Potassium Chloride ER 20 MEQ TBCR Take 20 mEq by mouth daily. 05/26/16  Yes Jegede, Olugbemiga E, MD  nicotine (NICODERM CQ - DOSED IN MG/24 HOURS) 14 mg/24hr patch Place 1 patch (14 mg total) onto the skin daily. Patient not taking: Reported on 10/28/2014 11/20/12   Robbie Lis, MD  Dg Chest 1 View  Result Date: 04/23/2017 CLINICAL DATA:  Preop testing. EXAM: CHEST  1 VIEW COMPARISON:  May 29, 2010 FINDINGS: The heart size and mediastinal contours are within normal limits. Both lungs are clear. The visualized skeletal structures are unremarkable. IMPRESSION: No active disease. Electronically Signed   By: Dorise Bullion III M.D   On: 04/23/2017 13:29   Dg Knee 1-2 Views Left  Result Date: 04/23/2017 CLINICAL DATA:  Pain after fall. EXAM: LEFT KNEE - 1-2 VIEW COMPARISON:  None. FINDINGS: There is a comminuted displaced fracture through the distal femoral diaphysis. There is a moderate suprapatellar joint effusion. No other fractures. Degenerative changes seen in all 3 compartments of the knee.  IMPRESSION: Comminuted displaced distal femoral diaphysis fracture with a moderate suprapatellar joint effusion. Degenerative changes in the knee. Electronically Signed   By: Dorise Bullion III M.D   On: 04/23/2017 13:31   Dg Ankle 2 Views Left  Result Date: 04/23/2017 CLINICAL DATA:  62 year old female with a history of fall EXAM: LEFT ANKLE - 2 VIEW COMPARISON:  None. FINDINGS: There is no evidence of fracture, dislocation, or joint effusion. Degenerative changes of the hindfoot. Soft tissues are unremarkable. IMPRESSION: Negative for acute bony abnormality Electronically Signed   By: Corrie Mckusick D.O.   On: 04/23/2017 13:31   Dg Hip Unilat With Pelvis 2-3 Views Left  Result Date: 04/23/2017 CLINICAL DATA:  Status post fall, LEFT-sided leg pain. EXAM: DG HIP (WITH OR WITHOUT PELVIS) 2-3V LEFT COMPARISON:  None. FINDINGS: Single view of the pelvis and three views of the LEFT hip are provided. Osseous alignment is normal. Bone mineralization is normal. No fracture line or displaced fracture fragment seen. Soft tissues about the pelvis and LEFT hip are unremarkable. IMPRESSION: Negative. Electronically Signed   By: Franki Cabot M.D.   On: 04/23/2017 13:31   - pertinent xrays, CT, MRI studies were reviewed and independently interpreted  Positive ROS: All other systems have been reviewed and were otherwise negative with the exception of those mentioned in the HPI and as above.  Physical Exam: General: Alert, no acute distress Psychiatric: Patient is competent for consent with normal mood and affect Lymphatic: No axillary or cervical lymphadenopathy Cardiovascular: No pedal edema Respiratory: No cyanosis, no use of accessory musculature GI: No organomegaly, abdomen is soft and non-tender  Skin: Examination the skin is intact this is a closed fracture.  Images:  @ENCIMAGES @   Neurologic: Patient does not have protective sensation bilateral lower extremities.   MUSCULOSKELETAL:  Patient  has a good dorsalis pedis pulse.  Her foot is neurovascularly intact.  Radiographs shows a fracture that extends down to the femoral condyles with segmental fractures including a diaphyseal shaft fracture as well.  Assessment: Assessment: Supracondylar and femoral shaft fracture left leg.  Plan: Plan: Discussed with the patient and her husband recommendation to proceed with open reduction internal fixation.  Risks and benefits of surgery were discussed including infection neurovascular injury persistent pain need for additional surgery.  Patient does have osteoarthritis of the left knee.  Plan for surgery Sunday morning with a supracondylar plate.  Thank you for the consult and the opportunity to see Ms. [place], MD [facility] [phone] 8:36 PM

## 2017-04-23 NOTE — H&P (View-Only) (Signed)
ORTHOPAEDIC CONSULTATION  REQUESTING PHYSICIAN: Axel Filler, *  Chief Complaint: Left knee pain.  HPI: Christine Rocha is a 62 y.o. female who presents with segmental left supracondylar femur fracture.  Patient states she was cleaning at home [place] some polish on a rag she states she got some on the floor and slipped on the floor sustaining the segmental supracondylar femur fracture.  Past Medical History:  Diagnosis Date  . Hypertension   . Seizures ([place])    History reviewed. No pertinent surgical history. Social History   Socioeconomic History  . Marital status: Single    Spouse name: Not on file  . Number of children: Not on file  . Years of education: Not on file  . Highest education level: Not on file  Occupational History  . Not on file  Social Needs  . Financial resource strain: Not on file  . Food insecurity:    Worry: Not on file    Inability: Not on file  . Transportation needs:    Medical: Not on file    Non-medical: Not on file  Tobacco Use  . Smoking status: Current Every Day Smoker    Packs/day: 0.25    Types: Cigarettes  . Smokeless tobacco: Never Used  Substance and Sexual Activity  . Alcohol use: No  . Drug use: No  . Sexual activity: Never    Birth control/protection: None  Lifestyle  . Physical activity:    Days per week: Not on file    Minutes per session: Not on file  . Stress: Not on file  Relationships  . Social connections:    Talks on phone: Not on file    Gets together: Not on file    Attends religious service: Not on file    Active member of club or organization: Not on file    Attends meetings of clubs or organizations: Not on file    Relationship status: Not on file  Other Topics Concern  . Not on file  Social History Narrative  . Not on file   History reviewed. No pertinent family history. - negative except otherwise stated in the family history section Allergies  Allergen Reactions  . Penicillins     Prior to Admission medications   Medication Sig Start Date End Date Taking? Authorizing Provider  amLODipine (NORVASC) 10 MG tablet Take 1 tablet (10 mg total) by mouth daily. 05/26/16  Yes Jegede, Marlena Clipper, MD  atenolol-chlorthalidone (TENORETIC 100) 100-25 MG tablet Take 1 tablet by mouth daily. 05/26/16  Yes Tresa Garter, MD  buPROPion (ZYBAN) 150 MG 12 hr tablet Take 1 tablet (150 mg total) by mouth 2 (two) times daily. 05/26/16  Yes Tresa Garter, MD  carboxymethylcellulose (REFRESH PLUS) 0.5 % SOLN Place 1 drop into both eyes daily as needed (dry eyes).   Yes [provider]  ibuprofen (ADVIL,MOTRIN) 400 MG tablet Take 1 tablet (400 mg total) by mouth every 6 (six) hours as needed for fever or moderate pain. 10/30/14  Yes Tresa Garter, MD  Multiple Vitamins-Minerals (WOMENS 50+ MULTI VITAMIN/MIN) TABS Take 1 tablet by mouth daily.   Yes [provider]  Potassium Chloride ER 20 MEQ TBCR Take 20 mEq by mouth daily. 05/26/16  Yes Jegede, Olugbemiga E, MD  nicotine (NICODERM CQ - DOSED IN MG/24 HOURS) 14 mg/24hr patch Place 1 patch (14 mg total) onto the skin daily. Patient not taking: Reported on 10/28/2014 11/20/12   Robbie Lis, MD  Dg Chest 1 View  Result Date: 04/23/2017 CLINICAL DATA:  Preop testing. EXAM: CHEST  1 VIEW COMPARISON:  May 29, 2010 FINDINGS: The heart size and mediastinal contours are within normal limits. Both lungs are clear. The visualized skeletal structures are unremarkable. IMPRESSION: No active disease. Electronically Signed   By: Dorise Bullion III M.D   On: 04/23/2017 13:29   Dg Knee 1-2 Views Left  Result Date: 04/23/2017 CLINICAL DATA:  Pain after fall. EXAM: LEFT KNEE - 1-2 VIEW COMPARISON:  None. FINDINGS: There is a comminuted displaced fracture through the distal femoral diaphysis. There is a moderate suprapatellar joint effusion. No other fractures. Degenerative changes seen in all 3 compartments of the knee.  IMPRESSION: Comminuted displaced distal femoral diaphysis fracture with a moderate suprapatellar joint effusion. Degenerative changes in the knee. Electronically Signed   By: Dorise Bullion III M.D   On: 04/23/2017 13:31   Dg Ankle 2 Views Left  Result Date: 04/23/2017 CLINICAL DATA:  62 year old female with a history of fall EXAM: LEFT ANKLE - 2 VIEW COMPARISON:  None. FINDINGS: There is no evidence of fracture, dislocation, or joint effusion. Degenerative changes of the hindfoot. Soft tissues are unremarkable. IMPRESSION: Negative for acute bony abnormality Electronically Signed   By: Corrie Mckusick D.O.   On: 04/23/2017 13:31   Dg Hip Unilat With Pelvis 2-3 Views Left  Result Date: 04/23/2017 CLINICAL DATA:  Status post fall, LEFT-sided leg pain. EXAM: DG HIP (WITH OR WITHOUT PELVIS) 2-3V LEFT COMPARISON:  None. FINDINGS: Single view of the pelvis and three views of the LEFT hip are provided. Osseous alignment is normal. Bone mineralization is normal. No fracture line or displaced fracture fragment seen. Soft tissues about the pelvis and LEFT hip are unremarkable. IMPRESSION: Negative. Electronically Signed   By: Franki Cabot M.D.   On: 04/23/2017 13:31   - pertinent xrays, CT, MRI studies were reviewed and independently interpreted  Positive ROS: All other systems have been reviewed and were otherwise negative with the exception of those mentioned in the HPI and as above.  Physical Exam: General: Alert, no acute distress Psychiatric: Patient is competent for consent with normal mood and affect Lymphatic: No axillary or cervical lymphadenopathy Cardiovascular: No pedal edema Respiratory: No cyanosis, no use of accessory musculature GI: No organomegaly, abdomen is soft and non-tender  Skin: Examination the skin is intact this is a closed fracture.  Images:  @ENCIMAGES @   Neurologic: Patient does not have protective sensation bilateral lower extremities.   MUSCULOSKELETAL:  Patient  has a good dorsalis pedis pulse.  Her foot is neurovascularly intact.  Radiographs shows a fracture that extends down to the femoral condyles with segmental fractures including a diaphyseal shaft fracture as well.  Assessment: Assessment: Supracondylar and femoral shaft fracture left leg.  Plan: Plan: Discussed with the patient and her husband recommendation to proceed with open reduction internal fixation.  Risks and benefits of surgery were discussed including infection neurovascular injury persistent pain need for additional surgery.  Patient does have osteoarthritis of the left knee.  Plan for surgery Sunday morning with a supracondylar plate.  Thank you for the consult and the opportunity to see Ms. [place], MD [facility] [phone] 8:36 PM

## 2017-04-23 NOTE — ED Triage Notes (Signed)
Per EMS: pt from home with husband with c/o a mechanical fall.  Pt slipped on a wet floor.  Was on the floor for approximately 30 min.  Pt c/o left sided leg pain.  Noticeable shortened and rotation.  Pt denies any back or neck pain.  EMS administered 100 mcg of fentanyl pta.

## 2017-04-23 NOTE — H&P (Signed)
Date: 04/23/2017               Patient Name:  Christine Rocha MRN: [id number]  DOB: 1956-01-05 Age / Sex: 62 y.o., female   PCP: Tresa Garter, MD         Medical Service: Internal Medicine Teaching Service         Attending Physician: Dr. Carmin Muskrat, MD    First Contact: Dr. Johny Chess Pager: [phone]  Second Contact: Dr. Heber Reid Hope King Pager: [phone]       After Hours (After 5p/  First Contact Pager: [phone]  weekends / holidays): Second Contact Pager: [phone]   Chief Complaint: Fall  History of Present Illness: [name] is a 62 yo F with a past medical history of HTN, pre-diabetes, osteoarthritis who presented to the ED following a mechanical fall.   She reports being in her normal state of health and was cleaning in her home this morning.  She spilled a small amount of a type of polish on her tile kitchen floor, she did not immediately clean this up and when she returned to the room slipped and fell.  She states she landed on her leg which she also felt was twisted abnormally.  She immediately felt pain and noticed that her leg began to swell.  She also notes some lower back pain.  She denies any lightheadedness, palpitations prior to the fall and discomfort and is purely mechanical.  No head injury.  In the ED, T 98.6, HR 64, BP 130/74, 99% on RA. Imaging of L lower extremity showed comminuted fracture of distal femur with associated joint effusion. She received dilaudid, zofran, and 1 L IVF. Ortho was consulted who are planning for ORIF tomorrow. Admitted to internal medicine service.   Meds:  Current Meds  Medication Sig  . amLODipine (NORVASC) 10 MG tablet Take 1 tablet (10 mg total) by mouth daily.  [name] atenolol-chlorthalidone (TENORETIC 100) 100-25 MG tablet Take 1 tablet by mouth daily.  [name] buPROPion (ZYBAN) 150 MG 12 hr tablet Take 1 tablet (150 mg total) by mouth 2 (two) times daily.  . carboxymethylcellulose (REFRESH PLUS) 0.5 % SOLN Place 1 drop into both  eyes daily as needed (dry eyes).  [name] ibuprofen (ADVIL,MOTRIN) 400 MG tablet Take 1 tablet (400 mg total) by mouth every 6 (six) hours as needed for fever or moderate pain.  . Multiple Vitamins-Minerals (WOMENS 50+ MULTI VITAMIN/MIN) TABS Take 1 tablet by mouth daily.  . Potassium Chloride ER 20 MEQ TBCR Take 20 mEq by mouth daily.     Allergies: Allergies as of 04/23/2017 - Review Complete 04/23/2017  Allergen Reaction Noted  . Penicillins  03/22/2011   Past Medical History:  Diagnosis Date  . Hypertension   . Seizures ([place])     Family History: Mother- HTN Father- Alcohol Use   Social History:  Social History   Tobacco Use  . Smoking status: Current Every Day Smoker    Packs/day: 0.25    Types: Cigarettes  . Smokeless tobacco: Never Used  Substance Use Topics  . Alcohol use: No  . Drug use: No  Former alcohol and drug use, none since 2008  Review of Systems: A complete ROS was negative except as per HPI.   Physical Exam: Blood pressure 122/70, pulse 68, temperature 98.6 F (37 C), temperature source Oral, resp. rate 15, height 5\' 3"  (1.6 m), weight 190 lb (86.2 kg), SpO2 94 %. General: Resting in bed, laying still, somnolent sleepy,  no acute distress Head: Normocephalic, atraumatic Eyes: PERRL, EOMI ENT: Moist mucous membranes, no pharyngeal exudate CV: Regular rate and rhythm, no murmur appreciated Resp: Anterior breath sounds clear to auscultation, normal work of breathing, no distress  Abd: Soft, +BS, nontender to palpation Extr: Distal pulses to bilateral lower extremities intact, brace in place to left lower extremity, pain with any attempts at movement Neuro: Alert and oriented x3, LE sensation intact, no focal deficits Skin: Warm, dry    CXR: personally reviewed my interpretation is no pleural effusion, no focal consolidation, no concerning findings.   Assessment & Plan by Problem:  L Femur Fracture Pt presented with L leg pain following mechanical fall  and found to have comminuted L distal femur fracture to be repaired by orthopedic surgery with ORIF 4/14.  --Pain control: Oxycodone 5 mg q6hr prn to start as pt was somewhat sleepy on admission exam --NPO at midnight, hold DVT ppx  --PT/OT following repair --CBC, BMP (normal labs on prior baseline)   H/o HTN On outpatient regimen of amlodipine 10 mg, atenolol-chlorthalidone 100-25 mg. Currently normotensive. Continue home regimen.   Diabetes Previous documentation of pre-diabetes, though last A1c on chart review 6.8. Not currently on outpatient regimen. Will monitor and use SSI while inpatient, likely start Metformin on discharge with further adjustments made as outpatient.  --Rpt A1c --SSI TID ac    Dispo: Admit patient to Inpatient with expected length of stay greater than 2 midnights.  Signed: Tawny Asal, MD 04/23/2017, 4:02 PM  Pager: [phone]

## 2017-04-23 NOTE — ED Provider Notes (Signed)
[facility] EMERGENCY DEPARTMENT Provider Note   CSN: [id number] Arrival date & time: 04/23/17  1117     History   Chief Complaint Chief Complaint  Patient presents with  . Fall  . Leg Pain    HPI Christine Rocha is a 62 y.o. female.  HPI Patient presents to the emergency department with injuries following a fall that occurred just prior to arrival.  The patient was cleaning when some of the cleaner got on her floor and she slipped falling landing on her left leg.  Patient was unable to get up after the fall.  EMS was called and she was placed in the knee leg splint.  Patient states that movement and palpation makes pain worse she would like giving any medicines prior to arrival.  Patient states she did not hit her head or lose consciousness.  The patient denies chest pain, shortness of breath, headache,blurred vision, neck pain, fever, cough, weakness, numbness, dizziness, anorexia, edema, abdominal pain, nausea, vomiting, diarrhea, rash, back pain, dysuria, hematemesis, bloody stool, near syncope, or syncope. Past Medical History:  Diagnosis Date  . Hypertension   . Seizures [facility])     Patient Active Problem List   Diagnosis Date Noted  . Preventative health care 10/28/2014  . Encounter for screening mammogram for breast cancer 10/28/2014  . Primary osteoarthritis of left knee 10/28/2014  . Essential hypertension 11/15/2013  . Prediabetes 11/15/2013  . Uncontrolled hypertension 10/11/2012  . Smoking addiction 10/11/2012    History reviewed. No pertinent surgical history.   OB History    Gravida  3   Para  0   Term  0   Preterm  0   AB  3   Living  0     SAB  3   TAB  0   Ectopic      Multiple      Live Births               Home Medications    Prior to Admission medications   Medication Sig Start Date End Date Taking? Authorizing Provider  amLODipine (NORVASC) 10 MG tablet Take 1 tablet (10 mg total) by mouth daily. 05/26/16   Yes Jegede, Marlena Clipper, MD  atenolol-chlorthalidone (TENORETIC 100) 100-25 MG tablet Take 1 tablet by mouth daily. 05/26/16  Yes Tresa Garter, MD  buPROPion (ZYBAN) 150 MG 12 hr tablet Take 1 tablet (150 mg total) by mouth 2 (two) times daily. 05/26/16  Yes Tresa Garter, MD  carboxymethylcellulose (REFRESH PLUS) 0.5 % SOLN Place 1 drop into both eyes daily as needed (dry eyes).   Yes [provider]  ibuprofen (ADVIL,MOTRIN) 400 MG tablet Take 1 tablet (400 mg total) by mouth every 6 (six) hours as needed for fever or moderate pain. 10/30/14  Yes Tresa Garter, MD  Multiple Vitamins-Minerals (WOMENS 50+ MULTI VITAMIN/MIN) TABS Take 1 tablet by mouth daily.   Yes [provider]  Potassium Chloride ER 20 MEQ TBCR Take 20 mEq by mouth daily. 05/26/16  Yes Jegede, Olugbemiga E, MD  nicotine (NICODERM CQ - DOSED IN MG/24 HOURS) 14 mg/24hr patch Place 1 patch (14 mg total) onto the skin daily. Patient not taking: Reported on 10/28/2014 11/20/12   Robbie Lis, MD    Family History History reviewed. No pertinent family history.  Social History Social History   Tobacco Use  . Smoking status: Current Every Day Smoker    Packs/day: 0.25    Types: Cigarettes  .  Smokeless tobacco: Never Used  Substance Use Topics  . Alcohol use: No  . Drug use: No     Allergies   Penicillins   Review of Systems Review of Systems All other systems negative except as documented in the HPI. All pertinent positives and negatives as reviewed in the HPI.  Physical Exam Updated Vital Signs BP 122/70   Pulse 68   Temp 98.6 F (37 C) (Oral)   Resp 15   Ht 5\' 3"  (1.6 m)   Wt 86.2 kg (190 lb)   SpO2 94%   BMI 33.66 kg/m   Physical Exam  Constitutional: She is oriented to person, place, and time. She appears well-developed and well-nourished. No distress.  HENT:  Head: Normocephalic and atraumatic.  Mouth/Throat: Oropharynx is clear and moist.  Eyes: Pupils are  equal, round, and reactive to light.  Neck: Normal range of motion. Neck supple.  Cardiovascular: Normal rate, regular rhythm and normal heart sounds. Exam reveals no gallop and no friction rub.  No murmur heard. Pulmonary/Chest: Effort normal and breath sounds normal. No respiratory distress. She has no wheezes.  Musculoskeletal:       Left knee: She exhibits decreased range of motion, swelling, deformity and abnormal alignment. She exhibits no laceration. Tenderness found.  Neurological: She is alert and oriented to person, place, and time. She exhibits normal muscle tone. Coordination normal.  Skin: Skin is warm and dry. Capillary refill takes less than 2 seconds. No rash noted. No erythema.  Psychiatric: She has a normal mood and affect. Her behavior is normal.  Nursing note and vitals reviewed.    ED Treatments / Results  Labs (all labs ordered are listed, but only abnormal results are displayed) Labs Reviewed - No data to display  EKG None  Radiology Dg Chest 1 View  Result Date: 04/23/2017 CLINICAL DATA:  Preop testing. EXAM: CHEST  1 VIEW COMPARISON:  May 29, 2010 FINDINGS: The heart size and mediastinal contours are within normal limits. Both lungs are clear. The visualized skeletal structures are unremarkable. IMPRESSION: No active disease. Electronically Signed   By: Dorise Bullion III M.D   On: 04/23/2017 13:29   Dg Knee 1-2 Views Left  Result Date: 04/23/2017 CLINICAL DATA:  Pain after fall. EXAM: LEFT KNEE - 1-2 VIEW COMPARISON:  None. FINDINGS: There is a comminuted displaced fracture through the distal femoral diaphysis. There is a moderate suprapatellar joint effusion. No other fractures. Degenerative changes seen in all 3 compartments of the knee. IMPRESSION: Comminuted displaced distal femoral diaphysis fracture with a moderate suprapatellar joint effusion. Degenerative changes in the knee. Electronically Signed   By: Dorise Bullion III M.D   On: 04/23/2017 13:31    Dg Ankle 2 Views Left  Result Date: 04/23/2017 CLINICAL DATA:  62 year old female with a history of fall EXAM: LEFT ANKLE - 2 VIEW COMPARISON:  None. FINDINGS: There is no evidence of fracture, dislocation, or joint effusion. Degenerative changes of the hindfoot. Soft tissues are unremarkable. IMPRESSION: Negative for acute bony abnormality Electronically Signed   By: Corrie Mckusick D.O.   On: 04/23/2017 13:31   Dg Hip Unilat With Pelvis 2-3 Views Left  Result Date: 04/23/2017 CLINICAL DATA:  Status post fall, LEFT-sided leg pain. EXAM: DG HIP (WITH OR WITHOUT PELVIS) 2-3V LEFT COMPARISON:  None. FINDINGS: Single view of the pelvis and three views of the LEFT hip are provided. Osseous alignment is normal. Bone mineralization is normal. No fracture line or displaced fracture fragment seen. Soft tissues  about the pelvis and LEFT hip are unremarkable. IMPRESSION: Negative. Electronically Signed   By: Franki Cabot M.D.   On: 04/23/2017 13:31    Procedures Procedures (including critical care time)  Medications Ordered in ED Medications  HYDROmorphone (DILAUDID) injection 1 mg (1 mg Intravenous Given 04/23/17 1212)  sodium chloride 0.9 % bolus 1,000 mL (0 mLs Intravenous Stopped 04/23/17 1459)  ondansetron (ZOFRAN) injection 4 mg (4 mg Intravenous Given 04/23/17 1212)     Initial Impression / Assessment and Plan / ED Course  I have reviewed the triage vital signs and the nursing notes.  Pertinent labs & imaging results that were available during my care of the patient were reviewed by me and considered in my medical decision making (see chart for details).     I spoke with the orthopedist along with the internal medicine residents who will admit the patient to the hospital.  Patient has been given pain control and remained stable here in the emergency department.   Final Clinical Impressions(s) / ED Diagnoses   Final diagnoses:  Other closed fracture of distal end of left femur, initial  encounter [facility])    ED Discharge Orders    None       Dalia Heading, PA-C 04/24/17 1618    Carmin Muskrat, MD 04/24/17 [phone]

## 2017-04-23 NOTE — Progress Notes (Signed)
Orthopedic Tech Progress Note Patient Details:  NOAMI BOVE 10/23/1955 [id number]  Ortho Devices Type of Ortho Device: Knee Immobilizer Ortho Device/Splint Location: LLE Ortho Device/Splint Interventions: Ordered, Application   Post Interventions Patient Tolerated: Well Instructions Provided: Care of device   Braulio Bosch 04/23/2017, 3:44 PM

## 2017-04-24 ENCOUNTER — Encounter (HOSPITAL_COMMUNITY): Payer: Self-pay | Admitting: Certified Registered Nurse Anesthetist

## 2017-04-24 ENCOUNTER — Inpatient Hospital Stay (HOSPITAL_COMMUNITY): Payer: BLUE CROSS/BLUE SHIELD

## 2017-04-24 ENCOUNTER — Inpatient Hospital Stay (HOSPITAL_COMMUNITY): Payer: BLUE CROSS/BLUE SHIELD | Admitting: Anesthesiology

## 2017-04-24 ENCOUNTER — Other Ambulatory Visit: Payer: Self-pay

## 2017-04-24 ENCOUNTER — Encounter (HOSPITAL_COMMUNITY)
Admission: EM | Disposition: A | Discharge status: Skilled Nursing Facility | Payer: Self-pay | Source: Home / Self Care | Attending: Student in an Organized Health Care Education/Training Program

## 2017-04-24 DIAGNOSIS — W19XXXA Unspecified fall, initial encounter: Secondary | ICD-10-CM

## 2017-04-24 DIAGNOSIS — S72352A Displaced comminuted fracture of shaft of left femur, initial encounter for closed fracture: Secondary | ICD-10-CM

## 2017-04-24 HISTORY — PX: ORIF FEMUR FRACTURE: SHX2119

## 2017-04-24 LAB — RENAL FUNCTION PANEL
ALBUMIN: 3.2 g/dL — AB (ref 3.5–5.0)
Anion gap: 10 (ref 5–15)
CALCIUM: 8.7 mg/dL — AB (ref 8.9–10.3)
CO2: 27 mmol/L (ref 22–32)
CREATININE: 0.85 mg/dL (ref 0.44–1.00)
Chloride: 101 mmol/L (ref 101–111)
GFR calc Af Amer: >60 mL/min (ref >60)
Glucose, Bld: 153 mg/dL — ABNORMAL HIGH (ref 65–99)
Phosphorus: 3 mg/dL (ref 2.5–4.6)
Potassium: 2.6 mmol/L — CL (ref 3.5–5.1)
SODIUM: 138 mmol/L (ref 135–145)

## 2017-04-24 LAB — BASIC METABOLIC PANEL
Anion gap: 11 (ref 5–15)
Anion gap: 9 (ref 5–15)
BUN: 5 mg/dL — AB (ref 6–20)
CALCIUM: 8.6 mg/dL — AB (ref 8.9–10.3)
CALCIUM: 8.3 mg/dL — AB (ref 8.9–10.3)
CHLORIDE: 100 mmol/L — AB (ref 101–111)
CO2: 25 mmol/L (ref 22–32)
CO2: 26 mmol/L (ref 22–32)
CREATININE: 0.87 mg/dL (ref 0.44–1.00)
CREATININE: 0.98 mg/dL (ref 0.44–1.00)
Chloride: 102 mmol/L (ref 101–111)
GFR calc Af Amer: >60 mL/min (ref >60)
GFR calc non Af Amer: >60 mL/min (ref >60)
GFR calc non Af Amer: >60 mL/min (ref >60)
GLUCOSE: 132 mg/dL — AB (ref 65–99)
GLUCOSE: 190 mg/dL — AB (ref 65–99)
Potassium: 3.2 mmol/L — ABNORMAL LOW (ref 3.5–5.1)
Potassium: 3.3 mmol/L — ABNORMAL LOW (ref 3.5–5.1)
Sodium: 136 mmol/L (ref 135–145)
Sodium: 137 mmol/L (ref 135–145)

## 2017-04-24 LAB — SURGICAL PCR SCREEN
MRSA, PCR: NEGATIVE
Staphylococcus aureus: NEGATIVE

## 2017-04-24 LAB — CBC
HCT: 33.2 % — ABNORMAL LOW (ref 36.0–46.0)
Hemoglobin: 11 g/dL — ABNORMAL LOW (ref 12.0–15.0)
MCH: 29.6 pg (ref 26.0–34.0)
MCHC: 33.1 g/dL (ref 30.0–36.0)
MCV: 89.2 fL (ref 78.0–100.0)
PLATELETS: 268 10*3/uL (ref 150–400)
RBC: 3.72 MIL/uL — ABNORMAL LOW (ref 3.87–5.11)
RDW: 14.1 % (ref 11.5–15.5)
WBC: 6.3 10*3/uL (ref 4.0–10.5)

## 2017-04-24 LAB — GLUCOSE, CAPILLARY
Glucose-Capillary: 127 mg/dL — ABNORMAL HIGH (ref 65–99)
Glucose-Capillary: 294 mg/dL — ABNORMAL HIGH (ref 65–99)

## 2017-04-24 LAB — MAGNESIUM: MAGNESIUM: 1.6 mg/dL — AB (ref 1.7–2.4)

## 2017-04-24 LAB — HIV ANTIBODY (ROUTINE TESTING W REFLEX): HIV SCREEN 4TH GENERATION: NONREACTIVE

## 2017-04-24 SURGERY — OPEN REDUCTION INTERNAL FIXATION (ORIF) DISTAL FEMUR FRACTURE
Anesthesia: General | Site: Leg Upper | Laterality: Left

## 2017-04-24 MED ORDER — MAGNESIUM SULFATE 2 GM/50ML IV SOLN
2.0000 g | Freq: Once | INTRAVENOUS | Status: AC
  Administered 2017-04-24: 2 g via INTRAVENOUS
  Filled 2017-04-24: qty 50

## 2017-04-24 MED ORDER — ACETAMINOPHEN 325 MG PO TABS: 325.0000 mg | ORAL_TABLET | Freq: Four times a day (QID) | ORAL | Status: DC | PRN

## 2017-04-24 MED ORDER — LIDOCAINE 2% (20 MG/ML) 5 ML SYRINGE
INTRAMUSCULAR | Status: DC | PRN
  Administered 2017-04-24: 100 mg via INTRAVENOUS

## 2017-04-24 MED ORDER — FENTANYL CITRATE (PF) 100 MCG/2ML IJ SOLN
INTRAMUSCULAR | Status: AC
  Administered 2017-04-24: 50 ug via INTRAVENOUS
  Filled 2017-04-24: qty 2

## 2017-04-24 MED ORDER — MAGNESIUM CITRATE PO SOLN: 1.0000 | Freq: Once | ORAL | Status: DC | PRN

## 2017-04-24 MED ORDER — SCOPOLAMINE 1 MG/3DAYS TD PT72
MEDICATED_PATCH | TRANSDERMAL | Status: DC | PRN
  Administered 2017-04-24: 1 via TRANSDERMAL

## 2017-04-24 MED ORDER — FENTANYL CITRATE (PF) 250 MCG/5ML IJ SOLN
INTRAMUSCULAR | Status: AC
  Filled 2017-04-24: qty 5

## 2017-04-24 MED ORDER — HYDROMORPHONE HCL 2 MG/ML IJ SOLN: 0.5000 mg | INTRAMUSCULAR | Status: DC | PRN

## 2017-04-24 MED ORDER — POTASSIUM CHLORIDE CRYS ER 20 MEQ PO TBCR: 40.0000 meq | EXTENDED_RELEASE_TABLET | Freq: Once | ORAL | Status: DC

## 2017-04-24 MED ORDER — DEXAMETHASONE SODIUM PHOSPHATE 10 MG/ML IJ SOLN
INTRAMUSCULAR | Status: AC
  Filled 2017-04-24: qty 1

## 2017-04-24 MED ORDER — CHLORTHALIDONE 25 MG PO TABS
25.0000 mg | ORAL_TABLET | Freq: Every day | ORAL | Status: DC
  Administered 2017-04-25: 25 mg via ORAL
  Filled 2017-04-24 (×2): qty 1

## 2017-04-24 MED ORDER — DOCUSATE SODIUM 100 MG PO CAPS
100.0000 mg | ORAL_CAPSULE | Freq: Two times a day (BID) | ORAL | Status: DC
  Administered 2017-04-24 – 2017-04-28 (×7): 100 mg via ORAL
  Filled 2017-04-24 (×8): qty 1

## 2017-04-24 MED ORDER — METOCLOPRAMIDE HCL 5 MG/ML IJ SOLN: 5.0000 mg | Freq: Three times a day (TID) | INTRAMUSCULAR | Status: DC | PRN

## 2017-04-24 MED ORDER — PHENYLEPHRINE 40 MCG/ML (10ML) SYRINGE FOR IV PUSH (FOR BLOOD PRESSURE SUPPORT)
PREFILLED_SYRINGE | INTRAVENOUS | Status: AC
  Filled 2017-04-24: qty 10

## 2017-04-24 MED ORDER — AMLODIPINE BESYLATE 10 MG PO TABS
10.0000 mg | ORAL_TABLET | Freq: Every day | ORAL | Status: DC
  Administered 2017-04-25 – 2017-04-28 (×3): 10 mg via ORAL
  Filled 2017-04-24 (×3): qty 1

## 2017-04-24 MED ORDER — METOCLOPRAMIDE HCL 5 MG PO TABS: 5.0000 mg | ORAL_TABLET | Freq: Three times a day (TID) | ORAL | Status: DC | PRN

## 2017-04-24 MED ORDER — MEPERIDINE HCL 50 MG/ML IJ SOLN: 6.2500 mg | INTRAMUSCULAR | Status: DC | PRN

## 2017-04-24 MED ORDER — BUPIVACAINE HCL (PF) 0.25 % IJ SOLN
INTRAMUSCULAR | Status: AC
  Filled 2017-04-24: qty 30

## 2017-04-24 MED ORDER — HYDROMORPHONE HCL 1 MG/ML IJ SOLN: 0.5000 mg | INTRAMUSCULAR | Status: DC | PRN

## 2017-04-24 MED ORDER — SODIUM CHLORIDE 0.9 % IV SOLN: INTRAVENOUS | Status: DC

## 2017-04-24 MED ORDER — OXYCODONE HCL 5 MG PO TABS
5.0000 mg | ORAL_TABLET | ORAL | Status: DC | PRN
  Administered 2017-04-24 – 2017-04-26 (×2): 10 mg via ORAL
  Filled 2017-04-24 (×4): qty 2

## 2017-04-24 MED ORDER — CLINDAMYCIN PHOSPHATE 600 MG/50ML IV SOLN
600.0000 mg | Freq: Four times a day (QID) | INTRAVENOUS | Status: AC
  Administered 2017-04-24 – 2017-04-25 (×2): 600 mg via INTRAVENOUS
  Filled 2017-04-24 (×3): qty 50

## 2017-04-24 MED ORDER — MUPIROCIN 2 % EX OINT: 1.0000 "application " | TOPICAL_OINTMENT | Freq: Two times a day (BID) | CUTANEOUS | Status: DC

## 2017-04-24 MED ORDER — NEOSTIGMINE METHYLSULFATE 5 MG/5ML IV SOSY
PREFILLED_SYRINGE | INTRAVENOUS | Status: AC
  Filled 2017-04-24: qty 5

## 2017-04-24 MED ORDER — MIDAZOLAM HCL 2 MG/2ML IJ SOLN
INTRAMUSCULAR | Status: AC
  Filled 2017-04-24: qty 2

## 2017-04-24 MED ORDER — PROPOFOL 10 MG/ML IV BOLUS
INTRAVENOUS | Status: AC
  Filled 2017-04-24: qty 40

## 2017-04-24 MED ORDER — OXYCODONE HCL 5 MG PO TABS
10.0000 mg | ORAL_TABLET | ORAL | Status: DC | PRN
  Filled 2017-04-24: qty 2

## 2017-04-24 MED ORDER — ONDANSETRON HCL 4 MG/2ML IJ SOLN
INTRAMUSCULAR | Status: AC
  Filled 2017-04-24: qty 2

## 2017-04-24 MED ORDER — ROPIVACAINE HCL 7.5 MG/ML IJ SOLN
INTRAMUSCULAR | Status: DC | PRN
  Administered 2017-04-24: 25 mL via PERINEURAL

## 2017-04-24 MED ORDER — DEXAMETHASONE SODIUM PHOSPHATE 10 MG/ML IJ SOLN
INTRAMUSCULAR | Status: DC | PRN
  Administered 2017-04-24: 10 mg via INTRAVENOUS

## 2017-04-24 MED ORDER — ONDANSETRON HCL 4 MG/2ML IJ SOLN: 4.0000 mg | Freq: Four times a day (QID) | INTRAMUSCULAR | Status: DC | PRN

## 2017-04-24 MED ORDER — ROCURONIUM BROMIDE 10 MG/ML (PF) SYRINGE
PREFILLED_SYRINGE | INTRAVENOUS | Status: AC
  Filled 2017-04-24: qty 5

## 2017-04-24 MED ORDER — DEXTROSE 5 % IV SOLN
INTRAVENOUS | Status: DC | PRN
  Administered 2017-04-24: 25 ug/min via INTRAVENOUS

## 2017-04-24 MED ORDER — MIDAZOLAM HCL 5 MG/5ML IJ SOLN
INTRAMUSCULAR | Status: DC | PRN
  Administered 2017-04-24: 1 mg via INTRAVENOUS

## 2017-04-24 MED ORDER — PHENYLEPHRINE 40 MCG/ML (10ML) SYRINGE FOR IV PUSH (FOR BLOOD PRESSURE SUPPORT)
PREFILLED_SYRINGE | INTRAVENOUS | Status: DC | PRN
  Administered 2017-04-24 (×2): 80 ug via INTRAVENOUS

## 2017-04-24 MED ORDER — POVIDONE-IODINE 10 % EX SWAB
2.0000 "application " | Freq: Once | CUTANEOUS | Status: AC
  Administered 2017-04-24: 2 via TOPICAL

## 2017-04-24 MED ORDER — ATENOLOL 50 MG PO TABS
100.0000 mg | ORAL_TABLET | Freq: Every day | ORAL | Status: DC
  Administered 2017-04-25 – 2017-04-28 (×3): 100 mg via ORAL
  Filled 2017-04-24 (×3): qty 2

## 2017-04-24 MED ORDER — ONDANSETRON HCL 4 MG PO TABS
4.0000 mg | ORAL_TABLET | Freq: Four times a day (QID) | ORAL | Status: DC | PRN
Start: 2017-04-24 — End: 2017-04-28

## 2017-04-24 MED ORDER — HYDROMORPHONE HCL 2 MG/ML IJ SOLN
0.2500 mg | INTRAMUSCULAR | Status: DC | PRN
Start: 2017-04-24 — End: 2017-04-24

## 2017-04-24 MED ORDER — 0.9 % SODIUM CHLORIDE (POUR BTL) OPTIME
TOPICAL | Status: DC | PRN
  Administered 2017-04-24: 1000 mL

## 2017-04-24 MED ORDER — LIDOCAINE 2% (20 MG/ML) 5 ML SYRINGE
INTRAMUSCULAR | Status: AC
  Filled 2017-04-24: qty 5

## 2017-04-24 MED ORDER — POTASSIUM CHLORIDE CRYS ER 20 MEQ PO TBCR
40.0000 meq | EXTENDED_RELEASE_TABLET | Freq: Once | ORAL | Status: AC
  Administered 2017-04-24: 40 meq via ORAL
  Filled 2017-04-24: qty 2

## 2017-04-24 MED ORDER — METHOCARBAMOL 500 MG PO TABS
500.0000 mg | ORAL_TABLET | Freq: Four times a day (QID) | ORAL | Status: DC | PRN
  Administered 2017-04-24 – 2017-04-26 (×4): 500 mg via ORAL
  Filled 2017-04-24 (×4): qty 1

## 2017-04-24 MED ORDER — ONDANSETRON HCL 4 MG/2ML IJ SOLN
INTRAMUSCULAR | Status: DC | PRN
  Administered 2017-04-24: 4 mg via INTRAVENOUS

## 2017-04-24 MED ORDER — ALBUMIN HUMAN 5 % IV SOLN
INTRAVENOUS | Status: DC | PRN
  Administered 2017-04-24: 16:00:00 via INTRAVENOUS

## 2017-04-24 MED ORDER — CHLORHEXIDINE GLUCONATE 4 % EX LIQD
60.0000 mL | Freq: Once | CUTANEOUS | Status: AC
  Administered 2017-04-24: 4 via TOPICAL

## 2017-04-24 MED ORDER — METHOCARBAMOL 1000 MG/10ML IJ SOLN
500.0000 mg | Freq: Four times a day (QID) | INTRAVENOUS | Status: DC | PRN
  Filled 2017-04-24: qty 5

## 2017-04-24 MED ORDER — BISACODYL 10 MG RE SUPP: 10.0000 mg | Freq: Every day | RECTAL | Status: DC | PRN

## 2017-04-24 MED ORDER — POTASSIUM CHLORIDE 10 MEQ/100ML IV SOLN
10.0000 meq | INTRAVENOUS | Status: AC
  Administered 2017-04-24 (×4): 10 meq via INTRAVENOUS
  Filled 2017-04-24 (×4): qty 100

## 2017-04-24 MED ORDER — FENTANYL CITRATE (PF) 100 MCG/2ML IJ SOLN
INTRAMUSCULAR | Status: DC | PRN
  Administered 2017-04-24: 25 ug via INTRAVENOUS
  Administered 2017-04-24: 50 ug via INTRAVENOUS
  Administered 2017-04-24 (×2): 25 ug via INTRAVENOUS
  Administered 2017-04-24: 50 ug via INTRAVENOUS

## 2017-04-24 MED ORDER — PROMETHAZINE HCL 25 MG/ML IJ SOLN: 6.2500 mg | INTRAMUSCULAR | Status: DC | PRN

## 2017-04-24 MED ORDER — LACTATED RINGERS IV SOLN
INTRAVENOUS | Status: DC
  Administered 2017-04-24 (×2): via INTRAVENOUS

## 2017-04-24 MED ORDER — PROPOFOL 10 MG/ML IV BOLUS
INTRAVENOUS | Status: DC | PRN
  Administered 2017-04-24: 200 mg via INTRAVENOUS

## 2017-04-24 MED ORDER — KETOROLAC TROMETHAMINE 30 MG/ML IJ SOLN
30.0000 mg | Freq: Once | INTRAMUSCULAR | Status: DC | PRN
Start: 2017-04-24 — End: 2017-04-24

## 2017-04-24 MED ORDER — FENTANYL CITRATE (PF) 100 MCG/2ML IJ SOLN
50.0000 ug | Freq: Once | INTRAMUSCULAR | Status: AC
  Administered 2017-04-24: 50 ug via INTRAVENOUS

## 2017-04-24 MED ORDER — CLINDAMYCIN PHOSPHATE 900 MG/50ML IV SOLN
900.0000 mg | INTRAVENOUS | Status: AC
  Administered 2017-04-24: 900 mg via INTRAVENOUS
  Filled 2017-04-24: qty 50

## 2017-04-24 MED ORDER — CLONIDINE HCL (ANALGESIA) 100 MCG/ML EP SOLN
EPIDURAL | Status: DC | PRN
  Administered 2017-04-24: 50 ug

## 2017-04-24 MED ORDER — POLYETHYLENE GLYCOL 3350 17 G PO PACK: 17.0000 g | PACK | Freq: Every day | ORAL | Status: DC | PRN

## 2017-04-24 MED ORDER — PROPOFOL 10 MG/ML IV BOLUS
INTRAVENOUS | Status: AC
  Filled 2017-04-24: qty 20

## 2017-04-24 SURGICAL SUPPLY — 76 items
BIT DRILL CALIBRATED 3.2MM (DRILL) ×1 IMPLANT
BIT DRILL Q/COUPLING 1 (BIT) ×6 IMPLANT
BLADE CLIPPER SURG (BLADE) IMPLANT
BLADE SURG 10 STRL SS (BLADE) ×3 IMPLANT
BNDG COHESIVE 6X5 TAN STRL LF (GAUZE/BANDAGES/DRESSINGS) ×3 IMPLANT
BNDG GAUZE ELAST 4 BULKY (GAUZE/BANDAGES/DRESSINGS) IMPLANT
CLEANER TIP ELECTROSURG 2X2 (MISCELLANEOUS) ×9 IMPLANT
COVER MAYO STAND STRL (DRAPES) ×3 IMPLANT
COVER SURGICAL LIGHT HANDLE (MISCELLANEOUS) ×6 IMPLANT
CUFF TOURNIQUET SINGLE 34IN LL (TOURNIQUET CUFF) IMPLANT
CUFF TOURNIQUET SINGLE 44IN (TOURNIQUET CUFF) IMPLANT
DRAPE C-ARM 42X72 X-RAY (DRAPES) ×6 IMPLANT
DRAPE C-ARMOR (DRAPES) ×3 IMPLANT
DRAPE IMP U-DRAPE 54X76 (DRAPES) IMPLANT
DRAPE INCISE IOBAN 66X45 STRL (DRAPES) ×3 IMPLANT
DRAPE ORTHO SPLIT 77X108 STRL (DRAPES)
DRAPE STERI IOBAN 125X83 (DRAPES) ×3 IMPLANT
DRAPE SURG ORHT 6 SPLT 77X108 (DRAPES) IMPLANT
DRAPE U-SHAPE 47X51 STRL (DRAPES) IMPLANT
DRILL CALIBRATED 3.2MM (DRILL) ×3
DRSG ADAPTIC 3X8 NADH LF (GAUZE/BANDAGES/DRESSINGS) IMPLANT
DRSG MEPILEX BORDER 4X12 (GAUZE/BANDAGES/DRESSINGS) ×3 IMPLANT
DRSG PAD ABDOMINAL 8X10 ST (GAUZE/BANDAGES/DRESSINGS) ×6 IMPLANT
DURAPREP 26ML APPLICATOR (WOUND CARE) ×3 IMPLANT
ELECT CAUTERY BLADE 6.4 (BLADE) ×6 IMPLANT
ELECT REM PT RETURN 9FT ADLT (ELECTROSURGICAL) ×3
ELECTRODE REM PT RTRN 9FT ADLT (ELECTROSURGICAL) ×1 IMPLANT
EVACUATOR 1/8 PVC DRAIN (DRAIN) IMPLANT
GAUZE SPONGE 4X4 12PLY STRL (GAUZE/BANDAGES/DRESSINGS) IMPLANT
GLOVE BIOGEL PI IND STRL 9 (GLOVE) ×1 IMPLANT
GLOVE BIOGEL PI INDICATOR 9 (GLOVE) ×2
GLOVE SKINSENSE NS SZ7.5 (GLOVE) ×2
GLOVE SKINSENSE STRL SZ7.5 (GLOVE) ×1 IMPLANT
GLOVE SURG ORTHO 9.0 STRL STRW (GLOVE) ×3 IMPLANT
GOWN STRL REUS W/ TWL XL LVL3 (GOWN DISPOSABLE) ×3 IMPLANT
GOWN STRL REUS W/TWL XL LVL3 (GOWN DISPOSABLE) ×6
KIT BASIN OR (CUSTOM PROCEDURE TRAY) ×3 IMPLANT
KIT TURNOVER KIT B (KITS) ×3 IMPLANT
MANIFOLD NEPTUNE II (INSTRUMENTS) IMPLANT
NEEDLE 22X1 1/2 (OR ONLY) (NEEDLE) ×3 IMPLANT
NS IRRIG 1000ML POUR BTL (IV SOLUTION) ×3 IMPLANT
PACK ORTHO EXTREMITY (CUSTOM PROCEDURE TRAY) ×3 IMPLANT
PACK UNIVERSAL I (CUSTOM PROCEDURE TRAY) ×3 IMPLANT
PAD ABD 8X10 STRL (GAUZE/BANDAGES/DRESSINGS) ×3 IMPLANT
PAD ARMBOARD 7.5X6 YLW CONV (MISCELLANEOUS) ×6 IMPLANT
PENCIL BUTTON HOLSTER BLD 10FT (ELECTRODE) ×3 IMPLANT
PLATE VA LCPCONDYLAR 12H (Plate) ×3 IMPLANT
SCREW CORTEX ST 4.5X32 (Screw) ×9 IMPLANT
SCREW CORTEX ST 4.5X34 (Screw) ×3 IMPLANT
SCREW CORTEX ST 4.5X42 (Screw) ×3 IMPLANT
SCREW CORTEX ST 4.5X46 (Screw) ×3 IMPLANT
SCREW CORTEX ST 4.5X48 (Screw) ×3 IMPLANT
SCREW CORTEX ST 4.5X68 (Screw) ×3 IMPLANT
SCREW CORTEX ST 4.5X70 (Screw) ×6 IMPLANT
SPONGE LAP 18X18 X RAY DECT (DISPOSABLE) ×6 IMPLANT
STAPLER VISISTAT 35W (STAPLE) ×3 IMPLANT
STOCKINETTE IMPERVIOUS LG (DRAPES) IMPLANT
SUCTION FRAZIER HANDLE 10FR (MISCELLANEOUS) ×2
SUCTION TUBE FRAZIER 10FR DISP (MISCELLANEOUS) ×1 IMPLANT
SUT ETHILON 2 0 PSLX (SUTURE) IMPLANT
SUT ETHILON 4 0 PS 2 18 (SUTURE) IMPLANT
SUT VIC AB 0 CTB1 27 (SUTURE) IMPLANT
SUT VIC AB 1 CTB1 27 (SUTURE) IMPLANT
SUT VIC AB 1 CTX 27 (SUTURE) ×3 IMPLANT
SUT VIC AB 2-0 CT1 27 (SUTURE) ×4
SUT VIC AB 2-0 CT1 TAPERPNT 27 (SUTURE) ×2 IMPLANT
SUT VIC AB 2-0 CTB1 (SUTURE) IMPLANT
SYR 20ML ECCENTRIC (SYRINGE) ×3 IMPLANT
TOWEL OR 17X24 6PK STRL BLUE (TOWEL DISPOSABLE) ×3 IMPLANT
TOWEL OR 17X26 10 PK STRL BLUE (TOWEL DISPOSABLE) ×3 IMPLANT
TUBE CONNECTING 12'X1/4 (SUCTIONS) ×1
TUBE CONNECTING 12X1/4 (SUCTIONS) ×2 IMPLANT
TUBE CONNECTING 20'X1/4 (TUBING) ×2
TUBE CONNECTING 20X1/4 (TUBING) ×4 IMPLANT
WATER STERILE IRR 1000ML POUR (IV SOLUTION) ×6 IMPLANT
YANKAUER SUCT BULB TIP NO VENT (SUCTIONS) ×12 IMPLANT

## 2017-04-24 NOTE — Transfer of Care (Signed)
Immediate Anesthesia Transfer of Care Note  Patient: Christine Rocha  Procedure(s) Performed: OPEN REDUCTION INTERNAL FIXATION (ORIF) DISTAL FEMUR FRACTURE (Left Leg Upper)  Patient Location: PACU  Anesthesia Type:General and regional   Level of Consciousness: awake, alert  and oriented  Airway & Oxygen Therapy: Patient Spontanous Breathing and Patient connected to face mask oxygen  Post-op Assessment: Report given to RN and Post -op Vital signs reviewed and stable  Post vital signs: Reviewed and stable  Last Vitals:  Vitals Value Taken Time  BP 128/82 04/24/2017  5:14 PM  Temp    Pulse 100 04/24/2017  5:17 PM  Resp 18 04/24/2017  5:17 PM  SpO2 94 % 04/24/2017  5:17 PM  Vitals shown include unvalidated device data.  Last Pain:  Vitals:   04/24/17 1341  TempSrc: Oral  PainSc:          Complications: No apparent anesthesia complications

## 2017-04-24 NOTE — Progress Notes (Signed)
   Subjective: No acute events overnight following admission, stable leg pain overnight. Pt denies prior diagnosis of osteoporosis or bisphosphonate therapy. Husband present at bedside and updated regarding plan.   Objective:  Vital signs in last 24 hours: Vitals:   04/23/17 1800 04/23/17 1838 04/23/17 2100 04/24/17 0525  BP: 124/63 115/61 123/62 125/62  Pulse: 88 82 78 88  Resp: 15 18 18    Temp:  98.3 F (36.8 C) 98.8 F (37.1 C) 98.9 F (37.2 C)  TempSrc:  Oral Oral Oral  SpO2: 100% 97% 97% 93%  Weight:      Height:       General: Resting in bed, no acute distress CV: RRR, no murmur appreciated Resp: Clear anterior breath sounds, normal work of breathing, no distress  Abd: Soft, +BS, non-tender  Extr: Immobilizer in place to L leg, limb length discrepancy  Neuro: Alert and oriented x3  Skin: Warm, dry    Assessment/Plan:  L Femur Fracture Pt presented with L leg pain following mechanical fall and found to have comminuted L distal femur fracture to be repaired by orthopedic surgery with ORIF 4/14. Will plan for starting DVT ppx following surgery, consideration of bisphosphonate initiation as outpatient on follow up.  --Cont NPO and holding DVT ppx until surgery --Pain control: Currently oxy 5 mg q6hr prn, adjust post op  --PT/OT following repair  Hypokalemia, Hypomagnesemia Labs resulted with K 2.8 potentially related to home thiazide diuretic. She received repletion, though K remained low at 2.6 on repeat. Mg also low at 1.6. Appropriate repletion ordered and being administered prior to OR.  --Repeat BMP in pm and replete as indicated  H/o HTN On outpatient regimen of amlodipine 10 mg, atenolol-chlorthalidone 100-25 mg. Currently normotensive. Continue home regimen.   Diabetes Previous documentation of pre-diabetes, though last A1c on chart review 6.8. Not currently on outpatient regimen. Will monitor and use SSI while inpatient, likely start Metformin on discharge with  further adjustments made as outpatient.  --SSI TID ac  Dispo: Anticipated discharge pending ORIF and post op course.   Tawny Asal, MD 04/24/2017, 6:35 AM Pager: [phone]

## 2017-04-24 NOTE — Interval H&P Note (Signed)
History and Physical Interval Note:  04/24/2017 8:36 AM  Christine Rocha  has presented today for surgery, with the diagnosis of supracondylar femur fx  The various methods of treatment have been discussed with the patient and family. After consideration of risks, benefits and other options for treatment, the patient has consented to  Procedure(s): OPEN REDUCTION INTERNAL FIXATION (ORIF) DISTAL FEMUR FRACTURE (Left) as a surgical intervention .  The patient's history has been reviewed, patient examined, no change in status, stable for surgery.  I have reviewed the patient's chart and labs.  Questions were answered to the patient's satisfaction.     [name]

## 2017-04-24 NOTE — Anesthesia Procedure Notes (Signed)
Anesthesia Regional Block: Femoral nerve block   Pre-Anesthetic Checklist: ,, timeout performed, Correct Patient, Correct Site, Correct Laterality, Correct Procedure, Correct Position, site marked, Risks and benefits discussed,  Surgical consent,  Pre-op evaluation,  At surgeon's request and post-op pain management  Laterality: Left  Prep: chloraprep       Needles:  Injection technique: Single-shot  Needle Type: Stimulator Needle - 80     Needle Length: 9cm  Needle Gauge: 22   Needle insertion depth: 6 cm   Additional Needles:   Procedures:,,,, ultrasound used (permanent image in chart),,,,  Narrative:  Start time: 04/24/2017 2:43 PM End time: 04/24/2017 2:46 PM Injection made incrementally with aspirations every 5 mL.  Performed by: Personally  Anesthesiologist: Nolon Nations, MD  Additional Notes: BP cuff, EKG monitors applied. Sedation begun. Femoral artery palpated for location of nerve. After nerve location verified with U/S, anesthetic injected incrementally, slowly, and after negative aspirations under direct u/s guidance. Good perineural spread. Patient tolerated well.

## 2017-04-24 NOTE — Progress Notes (Signed)
K 2.6,  Marge in OR notified and Message sent via AMION to Dr. Sharol Given.

## 2017-04-24 NOTE — Anesthesia Procedure Notes (Signed)
Procedure Name: LMA Insertion Date/Time: 04/24/2017 3:35 PM Performed by: Wilburn Cornelia, CRNA Pre-anesthesia Checklist: Patient identified, Emergency Drugs available, Suction available, Patient being monitored and Timeout performed Patient Re-evaluated:Patient Re-evaluated prior to induction Oxygen Delivery Method: Circle system utilized Preoxygenation: Pre-oxygenation with 100% oxygen Induction Type: IV induction Ventilation: Mask ventilation without difficulty LMA: LMA inserted LMA Size: 4.0 Number of attempts: 1 Placement Confirmation: positive ETCO2,  breath sounds checked- equal and bilateral and CO2 detector Tube secured with: Tape Dental Injury: Teeth and Oropharynx as per pre-operative assessment

## 2017-04-24 NOTE — Anesthesia Postprocedure Evaluation (Signed)
Anesthesia Post Note  Patient: Christine Rocha  Procedure(s) Performed: OPEN REDUCTION INTERNAL FIXATION (ORIF) DISTAL FEMUR FRACTURE (Left Leg Upper)     Patient location during evaluation: PACU Anesthesia Type: General Level of consciousness: sedated and patient cooperative Pain management: pain level controlled Vital Signs Assessment: post-procedure vital signs reviewed and stable Respiratory status: spontaneous breathing Cardiovascular status: stable Anesthetic complications: no    Last Vitals:  Vitals:   04/24/17 1745 04/24/17 1807  BP: 122/84 137/78  Pulse: 96 97  Resp: (!) 22   Temp:  37.1 C  SpO2: 93%     Last Pain:  Vitals:   04/24/17 1807  TempSrc: Oral  PainSc:                  Nolon Nations

## 2017-04-24 NOTE — Anesthesia Preprocedure Evaluation (Addendum)
Anesthesia Evaluation  Patient identified by MRN, date of birth, ID band Patient awake    Reviewed: Allergy & Precautions, NPO status , Patient's Chart, lab work & pertinent test results  Airway Mallampati: III  TM Distance: >3 FB Neck ROM: Limited    Dental no notable dental hx.    Pulmonary neg pulmonary ROS, Current Smoker,    Pulmonary exam normal breath sounds clear to auscultation       Cardiovascular hypertension, Normal cardiovascular exam Rhythm:Regular Rate:Normal     Neuro/Psych Seizures -,  negative psych ROS   GI/Hepatic negative GI ROS, Neg liver ROS,   Endo/Other  negative endocrine ROS  Renal/GU negative Renal ROS     Musculoskeletal  (+) Arthritis ,   Abdominal   Peds  Hematology negative hematology ROS (+)   Anesthesia Other Findings   Reproductive/Obstetrics negative OB ROS                            Anesthesia Physical Anesthesia Plan  ASA: II  Anesthesia Plan: General   Post-op Pain Management: GA combined w/ Regional for post-op pain   Induction: Intravenous  PONV Risk Score and Plan: 3 and Ondansetron, Dexamethasone and Scopolamine patch - Pre-op  Airway Management Planned: LMA  Additional Equipment:   Intra-op Plan:   Post-operative Plan: Extubation in OR  Informed Consent: I have reviewed the patients History and Physical, chart, labs and discussed the procedure including the risks, benefits and alternatives for the proposed anesthesia with the patient or authorized representative who has indicated his/her understanding and acceptance.   Dental advisory given  Plan Discussed with: CRNA  Anesthesia Plan Comments:         Anesthesia Quick Evaluation

## 2017-04-24 NOTE — Op Note (Signed)
04/24/2017  5:07 PM  PATIENT:  Christine Rocha    PRE-OPERATIVE DIAGNOSIS:  supracondylar femur fx, with femoral shaft fracture  POST-OPERATIVE DIAGNOSIS:  Same  PROCEDURE:  OPEN REDUCTION INTERNAL FIXATION (ORIF) DISTAL FEMUR FRACTURE, including the supracondylar femur fracture with intercondylar extension with the femoral shaft fracture.  SURGEON:  [name], MD  PHYSICIAN ASSISTANT:None ANESTHESIA:   General  PREOPERATIVE INDICATIONS:  Christine Rocha is a  62 y.o. female with a diagnosis of supracondylar femur fx who failed conservative measures and elected for surgical management.    The risks benefits and alternatives were discussed with the patient preoperatively including but not limited to the risks of infection, bleeding, nerve injury, cardiopulmonary complications, the need for revision surgery, among others, and the patient was willing to proceed.  OPERATIVE IMPLANTS: Synthes 12 hole supracondylar femoral plate  @ENCIMAGES @  OPERATIVE FINDINGS: Comminution of both the shaft and intra-articular extension with large hematoma within the knee  OPERATIVE PROCEDURE: Patient was brought to the operating room and underwent a general anesthetic.  After adequate levels of anesthesia were obtained patient was placed in the supine position on the Whitakers fracture table the left foot was placed in the boot traction and the left lower extremity was prepped using DuraPrep draped in the sterile field using the shower curtain.  A timeout was called.  C-arm fluoroscopy was used to reduce the fracture prior to skin incision.  A lateral incision was made this was carried down through the tensor fascia lata.  The vastus lateralis was elevated electrocautery was used for hemostasis.  The plate was applied distally and one compression screw was applied distally.  The fracture was then reduced to the plate and a proximal screw was placed.  3 compression screws were placed distally compressed the  intercondylar   DISCHARGE PLANNING:  Antibiotic duration: Fracture of the supracondylar fracture.  Additional screws were placed through the spiral fracture through the metaphyseal section of the femur to stabilize the spiral fracture through the metaphysis.  And 4 compression screws were placed proximal to the shaft fracture.  C-arm fluoroscopy verified alignment.  The wound was irrigated with normal saline the tensor fascia lata was closed using #1 Vicryl subcu was closed using 2-0 Vicryl the skin was closed using staples.  Mepilex dressing plus ABDs plus a Covan wrap was applied patient was extubated taken the PACU in stable condition.  Weightbearing: Nonweightbearing on the left  Pain medication: Orders written for high dose pain medication.  Dressing care/ Wound VAC: Reinforce dressing as needed  Ambulatory devices: Walker and wheelchair  Discharge to: Home or skilled nursing depending on patient's mobility with physical therapy.  Follow-up: In the office 1 week post operative.

## 2017-04-25 ENCOUNTER — Encounter: Payer: Self-pay | Admitting: Internal Medicine

## 2017-04-25 ENCOUNTER — Telehealth (INDEPENDENT_AMBULATORY_CARE_PROVIDER_SITE_OTHER): Payer: Self-pay | Admitting: Orthopedic Surgery

## 2017-04-25 ENCOUNTER — Encounter (HOSPITAL_COMMUNITY): Payer: Self-pay | Admitting: Orthopedic Surgery

## 2017-04-25 LAB — GLUCOSE, CAPILLARY
GLUCOSE-CAPILLARY: 149 mg/dL — AB (ref 65–99)
GLUCOSE-CAPILLARY: 104 mg/dL — AB (ref 65–99)
Glucose-Capillary: 259 mg/dL — ABNORMAL HIGH (ref 65–99)

## 2017-04-25 LAB — CBC
HEMATOCRIT: 27.4 % — AB (ref 36.0–46.0)
Hemoglobin: 9 g/dL — ABNORMAL LOW (ref 12.0–15.0)
MCH: 29.3 pg (ref 26.0–34.0)
MCHC: 32.8 g/dL (ref 30.0–36.0)
MCV: 89.3 fL (ref 78.0–100.0)
Platelets: 246 10*3/uL (ref 150–400)
RBC: 3.07 MIL/uL — AB (ref 3.87–5.11)
RDW: 13.9 % (ref 11.5–15.5)
WBC: 8.5 10*3/uL (ref 4.0–10.5)

## 2017-04-25 LAB — BASIC METABOLIC PANEL
Anion gap: 11 (ref 5–15)
Anion gap: 10 (ref 5–15)
BUN: 5 mg/dL — AB (ref 6–20)
BUN: 7 mg/dL (ref 6–20)
CHLORIDE: 100 mmol/L — AB (ref 101–111)
CHLORIDE: 101 mmol/L (ref 101–111)
CO2: 23 mmol/L (ref 22–32)
CO2: 26 mmol/L (ref 22–32)
Calcium: 8.6 mg/dL — ABNORMAL LOW (ref 8.9–10.3)
Calcium: 8.9 mg/dL (ref 8.9–10.3)
Creatinine, Ser: 1.02 mg/dL — ABNORMAL HIGH (ref 0.44–1.00)
Creatinine, Ser: 0.99 mg/dL (ref 0.44–1.00)
GFR calc Af Amer: >60 mL/min (ref >60)
GFR calc Af Amer: >60 mL/min (ref >60)
GFR calc non Af Amer: 59 mL/min — ABNORMAL LOW (ref >60)
GFR calc non Af Amer: >60 mL/min (ref >60)
Glucose, Bld: 227 mg/dL — ABNORMAL HIGH (ref 65–99)
Glucose, Bld: 122 mg/dL — ABNORMAL HIGH (ref 65–99)
POTASSIUM: 2.9 mmol/L — AB (ref 3.5–5.1)
POTASSIUM: 3.6 mmol/L (ref 3.5–5.1)
Sodium: 134 mmol/L — ABNORMAL LOW (ref 135–145)
Sodium: 137 mmol/L (ref 135–145)

## 2017-04-25 LAB — MAGNESIUM: Magnesium: 2.1 mg/dL (ref 1.7–2.4)

## 2017-04-25 MED ORDER — POTASSIUM CHLORIDE CRYS ER 20 MEQ PO TBCR
40.0000 meq | EXTENDED_RELEASE_TABLET | Freq: Once | ORAL | Status: AC
  Administered 2017-04-25: 40 meq via ORAL
  Filled 2017-04-25: qty 2

## 2017-04-25 MED ORDER — POTASSIUM CHLORIDE 10 MEQ/100ML IV SOLN
10.0000 meq | INTRAVENOUS | Status: AC
  Administered 2017-04-25 (×5): 10 meq via INTRAVENOUS
  Filled 2017-04-25 (×5): qty 100

## 2017-04-25 NOTE — [place] FL2 (Signed)
[place] MEDICAID FL2 LEVEL OF CARE SCREENING TOOL     IDENTIFICATION  Patient Name: Christine Rocha Birthdate: 01-13-1955 Sex: female Admission Date (Current Location): 04/23/2017  [facility] and [place] Number:  Herbalist and Address:  [facility] [place] [place] [address] [place], [place] [address]      Provider Number: [id number]  Attending Physician Name and Address:  Axel Filler, *  Relative Name and Phone Number:  Johnanna Bakke, spouse, [phone]    Current Level of Care: SNF Recommended Level of Care: [place] Prior Approval Number:    Date Approved/Denied:   PASRR Number: [id number]  Discharge Plan: SNF    Current Diagnoses: Patient Active Problem List   Diagnosis Date Noted  . Femur fracture, left ([place]) 04/23/2017  . Preventative health care 10/28/2014  . Encounter for screening mammogram for breast cancer 10/28/2014  . Primary osteoarthritis of left knee 10/28/2014  . Essential hypertension 11/15/2013  . Prediabetes 11/15/2013  . Uncontrolled hypertension 10/11/2012  . Smoking addiction 10/11/2012    Orientation RESPIRATION BLADDER Height & Weight     Self, Time, Situation, Place  Normal Continent Weight: 190 lb (86.2 kg) Height:  5\' 3"  (160 cm)  BEHAVIORAL SYMPTOMS/MOOD NEUROLOGICAL BOWEL NUTRITION STATUS      Continent Diet(See DC Summary)  AMBULATORY STATUS COMMUNICATION OF NEEDS Skin   Extensive Assist Verbally Surgical wounds                       Personal Care Assistance Level of Assistance  Dressing, Feeding, Bathing Bathing Assistance: Limited assistance Feeding assistance: Independent Dressing Assistance: Limited assistance     Functional Limitations Info  Sight, Hearing, Speech Sight Info: Adequate Hearing Info: Adequate Speech Info: Adequate    SPECIAL CARE FACTORS FREQUENCY  PT (By licensed PT), OT (By licensed OT)     PT Frequency: 5x week OT Frequency: 5x week             Contractures      Additional Factors Info  Code Status, Allergies, Insulin Sliding Scale, Psychotropic Code Status Info: Full Allergies Info: Penicillins Psychotropic Info: Wellbutrin Insulin Sliding Scale Info: Insulin Daily       Current Medications (04/25/2017):  This is the current hospital active medication list Current Facility-Administered Medications  Medication Dose Route Frequency Provider Last Rate Last Dose  . 0.9 %  sodium chloride infusion   Intravenous Continuous [name], MD      . acetaminophen (TYLENOL) tablet 650 mg  650 mg Oral Q6H PRN [name], MD       Or  . acetaminophen (TYLENOL) suppository 650 mg  650 mg Rectal Q6H PRN [name], MD      . acetaminophen (TYLENOL) tablet 325-650 mg  325-650 mg Oral Q6H PRN [name], MD      . amLODipine (NORVASC) tablet 10 mg  10 mg Oral Daily [name], MD   10 mg at 04/25/17 0835  . atenolol (TENORMIN) tablet 100 mg  100 mg Oral Daily [name], MD   100 mg at 04/25/17 0835  . bisacodyl (DULCOLAX) suppository 10 mg  10 mg Rectal Daily PRN [name], MD      . buPROPion [facility] SR) 12 hr tablet 150 mg  150 mg Oral BID [name], MD   150 mg at 04/25/17 2778  . docusate sodium (COLACE) capsule 100 mg  100 mg Oral  BID [name], MD   100 mg at 04/25/17 9833  . enoxaparin (LOVENOX) injection 40 mg  40 mg Subcutaneous Q24H [name], MD   40 mg at 04/24/17 2130  . HYDROmorphone (DILAUDID) injection 0.5-1 mg  0.5-1 mg Intravenous Q4H PRN Axel Filler, MD      . insulin aspart (novoLOG) injection 0-5 Units  0-5 Units Subcutaneous QHS [name], MD   3 Units at 04/24/17 2131  . insulin aspart (novoLOG) injection 0-9 Units  0-9 Units Subcutaneous TID WC [name], MD   1 Units at 04/25/17 1200  . lactated ringers infusion   Intravenous Continuous [name], MD 10 mL/hr at 04/24/17 1415    . magnesium citrate solution 1 Bottle  1 Bottle Oral  Once PRN [name], MD      . methocarbamol (ROBAXIN) tablet 500 mg  500 mg Oral Q6H PRN [name], MD   500 mg at 04/25/17 8250   Or  . methocarbamol (ROBAXIN) 500 mg in dextrose 5 % 50 mL IVPB  500 mg Intravenous Q6H PRN [name], MD      . metoCLOPramide (REGLAN) tablet 5-10 mg  5-10 mg Oral Q8H PRN [name], MD       Or  . metoCLOPramide (REGLAN) injection 5-10 mg  5-10 mg Intravenous Q8H PRN [name], MD      . ondansetron [facility]) tablet 4 mg  4 mg Oral Q6H PRN [name], MD       Or  . ondansetron [facility]) injection 4 mg  4 mg Intravenous Q6H PRN [name], MD      . oxyCODONE (Oxy IR/ROXICODONE) immediate release tablet 10-15 mg  10-15 mg Oral Q4H PRN [name], MD      . oxyCODONE (Oxy IR/ROXICODONE) immediate release tablet 5-10 mg  5-10 mg Oral Q4H PRN [name], MD   10 mg at 04/24/17 2130  . polyethylene glycol (MIRALAX / GLYCOLAX) packet 17 g  17 g Oral Daily PRN [name], MD      . senna-docusate (Senokot-S) tablet 1 tablet  1 tablet Oral QHS PRN [name], MD         Discharge Medications: Please see discharge summary for a list of discharge medications.  Relevant Imaging Results:  Relevant Lab Results:   Additional Information SS#: [id number]  [place], LCSW

## 2017-04-25 NOTE — Evaluation (Signed)
Physical Therapy Evaluation Patient Details Name: Christine Rocha MRN: [id number] DOB: [birth year] Today's Date: 04/25/2017   History of Present Illness  Pt is a 62 y.o. female s/p L ORIF for L femur fracture after pt fell at home while cleaning. PMH includes HTN, pre DM, and seizures.  Clinical Impression  Pt was able to get OOB to chair min assist for most mobility, maintaining WB restrictions.  Pt has limited help at home at discharge and would benefit from SNF level rehab prior to return home.   PT to follow acutely for deficits listed below.   HEP program education initiated.  Follow Up Recommendations SNF    Equipment Recommendations  Rolling walker with 5" wheels;3in1 (PT)    Recommendations for Other Services   NA    Precautions / Restrictions Precautions Required Braces or Orthoses: Knee Immobilizer - Left(Will call Dr. Sharol Given to check knee brace requirements) Knee Immobilizer - Left: Other (comment)(Checking on knee immobilizer requirements with Dr. Sharol Given) Restrictions Weight Bearing Restrictions: Yes LLE Weight Bearing: Non weight bearing      Mobility  Bed Mobility Overal bed mobility: Needs Assistance(assistance for L leg management supine to sitting EOB) Bed Mobility: Supine to Sit(supine to sitting EOB)     Supine to sit: Min assist(for L leg management)        Transfers Overall transfer level: Needs assistance Equipment used: Rolling walker (2 wheeled) Transfers: Sit to/from [facility] Sit to Stand: Min assist;From elevated surface Stand pivot transfers: Min assist(Min A for management of RW while hopping)       General transfer comment: Pt was able to hop independently while maintaining LLE NWB status. Pt required extra time for sit to stand transfer, requiring her to use BUE on RW and Min A at gait belt from an elevated surface to stand.    Balance Overall balance assessment: Independent                                            Pertinent Vitals/Pain Pain Assessment: 0-10 Pain Score: 7 (0/10 at rest; 7/10 with movement) Pain Location: L knee Pain Descriptors / Indicators: Sharp Pain Intervention(s): Limited activity within patient's tolerance;Monitored during session    Home Living Family/patient expects to be discharged to:: Private residence Living Arrangements: Spouse/significant other   Type of Home: Apartment Home Access: Stairs to enter(Pt and her husband are looking into having a ramp installed) Entrance Stairs-Rails: Can reach both;Left;Right Entrance Stairs-Number of Steps: 1 Home Layout: One level Home Equipment: Cane - single point      Prior Function Level of Independence: Independent with assistive device(s)(independent with single point cane)               Hand Dominance   Dominant Hand: Right    Extremity/Trunk Assessment   Upper Extremity Assessment Upper Extremity Assessment: Defer to OT evaluation    Lower Extremity Assessment Lower Extremity Assessment: Overall WFL for tasks assessed;RLE deficits/detail;LLE deficits/detail RLE Deficits / Details: Pt reports difficulty with AROM ankle pumps bilat but is 5/5 MMT for R ankle dorsiflexion and plantarflexion LLE Deficits / Details: AROM for L dorsiflexion and plantarflexion limited by pain but still WFL LLE: Unable to fully assess due to immobilization    Cervical / Trunk Assessment Cervical / Trunk Assessment: Normal  Communication   Communication: No difficulties  Cognition Arousal/Alertness: Awake/alert Behavior During Therapy: [facility]  for tasks assessed/performed Overall Cognitive Status: Within Functional Limits for tasks assessed                                           Exercises Total Joint Exercises Ankle Circles/Pumps: AROM;Both;5 reps;Seated(Pt reported difficulty and fatigue with ankle pumps) Quad Sets: AROM;Left;Other reps (comment)(Educated pt on LLE quad sets; unable to complete  more than 1) Hip ABduction/ADduction: AROM;Other reps (comment);Left(Pt completed 7 reps)   Assessment/Plan    PT Assessment Patient needs continued PT services  PT Problem List Decreased strength;Decreased activity tolerance;Decreased balance;Decreased mobility;Decreased knowledge of use of DME;Decreased knowledge of precautions;Pain;Decreased range of motion       PT Treatment Interventions DME instruction;Gait training;Stair training;Functional mobility training;Therapeutic activities;Therapeutic exercise;Neuromuscular re-education    PT Goals (Current goals can be found in the Care Plan section)  Acute Rehab PT Goals Patient Stated Goal: to go home PT Goal Formulation: With patient Time For Goal Achievement: 05/09/17 Potential to Achieve Goals: Good    Frequency     Barriers to discharge Decreased caregiver support(Husband started new job and has cancer; hard for him to help)         AM-PAC PT "6 Clicks" Daily Activity  Outcome Measure Difficulty turning over in bed (including adjusting bedclothes, sheets and blankets)?: Unable Difficulty moving from lying on back to sitting on the side of the bed? : Unable Difficulty sitting down on and standing up from a chair with arms (e.g., wheelchair, bedside commode, etc,.)?: Unable Help needed moving to and from a bed to chair (including a wheelchair)?: A Little Help needed walking in hospital room?: A Little Help needed climbing 3-5 steps with a railing? : A Lot 6 Click Score: 11    End of Session Equipment Utilized During Treatment: Gait belt;Left knee immobilizer Activity Tolerance: Patient tolerated treatment well;Patient limited by fatigue Patient left: in chair;with call bell/phone within reach Nurse Communication: Mobility status PT Visit Diagnosis: Muscle weakness (generalized) (M62.81);Unsteadiness on feet (R26.81);Other abnormalities of gait and mobility (R26.89);Pain Pain - Right/Left: Left Pain - part of body: Leg     Time: 9983-3825 PT Time Calculation (min) (ACUTE ONLY): 30 min   Charges:           Wells Guiles B. Arbell Wycoff, PT, DPT [phone]   PT Evaluation $PT Eval Moderate Complexity: 1 Mod PT Treatments  $Therapeutic Activity: 8-22 mins   04/25/2017, 11:21 AM

## 2017-04-25 NOTE — Progress Notes (Signed)
Patient ID: Christine Rocha, female   DOB: 1955/12/17, 62 y.o.   MRN: [id number] Patient is postoperative day 1 status post open reduction internal fixation left femur with a intercondylar fracture that extended into the joint of the left knee spiral fracture of the metaphysis of the distal femur as well as a femoral shaft fracture.  Discussed with the patient that she will need to be nonweightbearing on the left.  Patient could be out of work for up to a year.  Anticipate strict nonweightbearing on the left for 2 months.

## 2017-04-25 NOTE — Telephone Encounter (Signed)
Yes range of motion of the knee is okay for therapy.  Knee immobilizer for weightbearing to protect the knee until patient develops quad strength for stable ambulation.

## 2017-04-25 NOTE — Telephone Encounter (Signed)
Pt had ORIF distal femur fx yesterday and physical therapy is calling to see if she can bend her knee. shre is wearing an immobilizer. Please advise.

## 2017-04-25 NOTE — Plan of Care (Signed)

## 2017-04-25 NOTE — Telephone Encounter (Signed)
Christine Rocha-)PT( with [facility] called asked if patient can bend her knee. Patient still have knee immobilizer. The number to contact Christine Rocha) is [phone]  Christine Rocha is the primary therapist for the patient.

## 2017-04-25 NOTE — Social Work (Addendum)
CSW met with patient and spouse at bedside and they were concerned about medical bills and finances. CSW discussed options and utilizing community resources-social service and contacting insurance company directly to see if they can offer any assistance with their medical bills due to patients physical barriers. Pt and spouse voiced understanding and will f/u.  Elissa Hefty, LCSW Clinical Social Worker [phone]

## 2017-04-25 NOTE — Progress Notes (Signed)
   Subjective: Pt underwent ORIF of left femur fracture yesterday with no complications. No BM since admission. She reports doing well overall with pain controlled, pt and husband inquiring about recovery time and logistics with non-weight bearing status. Requested letter to employer    Objective:  Vital signs in last 24 hours: Vitals:   04/24/17 1745 04/24/17 1807 04/24/17 1949 04/25/17 0550  BP: 122/84 137/78 111/62 111/62  Pulse: 96 97 96 94  Resp: (!) 22  20   Temp:  98.8 F (37.1 C) 98.3 F (36.8 C) 98.3 F (36.8 C)  TempSrc:  Oral Oral Oral  SpO2: 93%  98% 93%  Weight:      Height:       General: Resting in bed, no acute distress CV: RRR, no murmur appreciated Resp: Clear anterior breath sounds, normal work of breathing, no distress  Abd: Soft, +BS, non-tender  Extr: Immobilizer in place to L leg with dressing underneath, good peripheral pulses  Neuro: Alert and oriented x3  Skin: Warm, dry    Assessment/Plan:  L Femur Fracture Pt presented with L leg pain following mechanical fall and found to have comminuted L distal femur fracture, repaired by orthopedic surgery with ORIF 4/14. Pt is to be nonweight bearing to extremity for at least two months, PT evaluation pending. Consideration of bisphosphonate initiation as outpatient can be discussed on follow up.   --Pain control: Oxy 5-10 mg q4hr prn --Lovenox   --Bowel regimen: Colace 100 mg BID, miralax and prns   --PT/OT   Hypokalemia, Hypomagnesemia Noted on additional labs with appropriate repletion ordered since that time, K continues to be low this am. Will hold home thiazide diuretic and continue to assess response.  --Repeat BMP in pm and replete as indicated  H/o HTN On outpatient regimen of amlodipine 10 mg, atenolol-chlorthalidone 100-25 mg. Currently normotensive. --Hold home chlorthalidone as above   Diabetes Previous documentation of pre-diabetes, though last A1c on chart review 6.8. Not currently on  outpatient regimen. Will monitor and use SSI while inpatient, likely start Metformin on discharge with further adjustments made as outpatient.  --SSI TID ac  Dispo: Anticipated discharge pending PT eval   Tawny Asal, MD 04/25/2017, 10:59 AM Pager: [phone]

## 2017-04-26 LAB — BASIC METABOLIC PANEL
Anion gap: 10 (ref 5–15)
Anion gap: 8 (ref 5–15)
BUN: 8 mg/dL (ref 6–20)
BUN: 7 mg/dL (ref 6–20)
CALCIUM: 8.6 mg/dL — AB (ref 8.9–10.3)
CALCIUM: 8.8 mg/dL — AB (ref 8.9–10.3)
CO2: 27 mmol/L (ref 22–32)
CO2: 28 mmol/L (ref 22–32)
CREATININE: 1.01 mg/dL — AB (ref 0.44–1.00)
CREATININE: 1.02 mg/dL — AB (ref 0.44–1.00)
Chloride: 101 mmol/L (ref 101–111)
Chloride: 100 mmol/L — ABNORMAL LOW (ref 101–111)
GFR, EST NON AFRICAN AMERICAN: 60 mL/min — AB (ref >60)
GFR, EST NON AFRICAN AMERICAN: 59 mL/min — AB (ref >60)
Glucose, Bld: 120 mg/dL — ABNORMAL HIGH (ref 65–99)
Glucose, Bld: 118 mg/dL — ABNORMAL HIGH (ref 65–99)
Potassium: 2.7 mmol/L — CL (ref 3.5–5.1)
Potassium: 3.7 mmol/L (ref 3.5–5.1)
SODIUM: 138 mmol/L (ref 135–145)
SODIUM: 136 mmol/L (ref 135–145)

## 2017-04-26 LAB — CBC
HCT: 25.3 % — ABNORMAL LOW (ref 36.0–46.0)
Hemoglobin: 8.2 g/dL — ABNORMAL LOW (ref 12.0–15.0)
MCH: 28.7 pg (ref 26.0–34.0)
MCHC: 32.4 g/dL (ref 30.0–36.0)
MCV: 88.5 fL (ref 78.0–100.0)
PLATELETS: 243 10*3/uL (ref 150–400)
RBC: 2.86 MIL/uL — ABNORMAL LOW (ref 3.87–5.11)
RDW: 13.9 % (ref 11.5–15.5)
WBC: 7.8 10*3/uL (ref 4.0–10.5)

## 2017-04-26 LAB — GLUCOSE, CAPILLARY
GLUCOSE-CAPILLARY: 116 mg/dL — AB (ref 65–99)
GLUCOSE-CAPILLARY: 95 mg/dL (ref 65–99)
Glucose-Capillary: 132 mg/dL — ABNORMAL HIGH (ref 65–99)
Glucose-Capillary: 121 mg/dL — ABNORMAL HIGH (ref 65–99)

## 2017-04-26 LAB — MAGNESIUM: Magnesium: 2 mg/dL (ref 1.7–2.4)

## 2017-04-26 MED ORDER — POLYETHYLENE GLYCOL 3350 17 G PO PACK
17.0000 g | PACK | Freq: Every day | ORAL | Status: DC
  Administered 2017-04-26 – 2017-04-27 (×2): 17 g via ORAL
  Filled 2017-04-26 (×3): qty 1

## 2017-04-26 MED ORDER — POTASSIUM CHLORIDE 10 MEQ/100ML IV SOLN
10.0000 meq | INTRAVENOUS | Status: DC
  Administered 2017-04-26: 10 meq via INTRAVENOUS
  Filled 2017-04-26 (×5): qty 100

## 2017-04-26 MED ORDER — POTASSIUM CHLORIDE CRYS ER 20 MEQ PO TBCR
40.0000 meq | EXTENDED_RELEASE_TABLET | ORAL | Status: AC
  Administered 2017-04-26 (×2): 40 meq via ORAL
  Filled 2017-04-26 (×2): qty 2

## 2017-04-26 MED ORDER — POTASSIUM CHLORIDE 10 MEQ/100ML IV SOLN
10.0000 meq | INTRAVENOUS | Status: AC
  Administered 2017-04-26 (×4): 10 meq via INTRAVENOUS
  Filled 2017-04-26 (×4): qty 100

## 2017-04-26 MED ORDER — POTASSIUM CHLORIDE 10 MEQ/100ML IV SOLN
10.0000 meq | INTRAVENOUS | Status: DC
  Filled 2017-04-26 (×3): qty 100

## 2017-04-26 NOTE — Clinical Social Work Note (Signed)
Clinical Social Work Assessment  Patient Details  Name: Christine Rocha MRN: [id number] Date of Birth: 11/11/1955  Date of referral:  04/25/17               Reason for consult:  Facility Placement                Permission sought to share information with:  Case Manager Permission granted to share information::  Yes, Verbal Permission Granted  Name::     Christine Rocha  Agency::  SNF  Relationship::  spouse  Contact Information:     Housing/Transportation Living arrangements for the past 2 months:  [place] of Information:  Patient Patient Interpreter Needed:  None Criminal Activity/Legal Involvement Pertinent to Current Situation/Hospitalization:  No - Comment as needed Significant Relationships:  Spouse, Other Family Members Lives with:  Spouse Do you feel safe going back to the place where you live?  No Need for family participation in patient care:  No (Coment)  Care giving concerns:  Pt has new impairment and will need rehabilitative therapies.  Social Worker assessment / plan:  CSW met with patient at bedside. Pt resides with spouse who works full time and will return to work soon and cannot care for her at home. Pt has no experience with SNF. Pt ws independent prior to hospitalization and worked a full time job as a Scientist, product/process development. Pt agreeable to SNF and gave permission to send out to SNF's. CSW explained SNF process and placement as well as Insurance Auth needed.   CSW will f/u for disposition when SNF offers received.  Employment status:  [employer] information:  Other (Comment Midwife) PT Recommendations:  [place] / Referral to community resources:  [place]  Patient/Family's Response to care:  Patient thanked CSW for meeting to discuss SNF placement and agreeable to SNF.  Patient/Family's Understanding of and Emotional Response to Diagnosis, Current Treatment, and Prognosis:  Patient  has good understanding of diagnosis and agreeable to SNF at discharge. Pt desires to get better with rehabilitative therapies and return home.  Pt will return to work when she is able to do so. Pt reports that her spouse works and cannot care for her at home.  Emotional Assessment Appearance:  Appears stated age Attitude/Demeanor/Rapport:  (Cooperative) Affect (typically observed):  Accepting, Appropriate Orientation:  Oriented to Self, Oriented to Place, Oriented to  Time, Oriented to Situation Alcohol / Substance use:  Not Applicable Psych involvement (Current and /or in the community):  No (Comment)  Discharge Needs  Concerns to be addressed:  Discharge Planning Concerns Readmission within the last 30 days:  No Current discharge risk:  Dependent with Mobility, Physical Impairment Barriers to Discharge:  No Barriers Identified   Normajean Baxter, LCSW 04/26/2017, 1:34 PM

## 2017-04-26 NOTE — Social Work (Signed)
CSW met with patient and family member at bedside and provided her the list of SNF offer's for placement. She will review and get back to CSW.  CSW will f/u as SNF will need to obtain Insurance Auth for placement.  Elissa Hefty, LCSW Clinical Social Worker [phone]

## 2017-04-26 NOTE — Progress Notes (Signed)
Patient ID: Christine Rocha, female   DOB: [birth year], 62 y.o.   MRN: [id number] Patient was able to get out of bed to a chair yesterday.  She will need discharge to skilled nursing.  I will follow-up in the office in 1-2 weeks.

## 2017-04-26 NOTE — Progress Notes (Signed)
   Subjective: No acute events overnight, reports improved pain, sat in chair and worked with therapy yesterday. States she did not have bowel movement, though one recorded in last 24 hrs.   Objective:  Vital signs in last 24 hours: Vitals:   04/25/17 0550 04/25/17 1327 04/25/17 2012 04/26/17 0527  BP: 111/62 (!) 109/55 (!) 114/54 (!) 93/52  Pulse: 94 79 76 81  Resp:  17 15 18   Temp: 98.3 F (36.8 C) 98.9 F (37.2 C) 98.9 F (37.2 C) 98 F (36.7 C)  TempSrc: Oral Oral Oral Oral  SpO2: 93% 98% 95% 100%  Weight:      Height:       General: Resting in bed, no acute distress CV: RRR, no murmur appreciated Resp: Clear anterior breath sounds, normal work of breathing, no distress  Abd: Soft, +BS, non-tender  Extr: Immobilizer in place to L leg with dressing underneath, good peripheral pulses  Neuro: Alert and oriented x3  Skin: Warm, dry    Assessment/Plan:  L Femur Fracture Pt presented with L leg pain following mechanical fall and found to have comminuted L distal femur fracture, repaired by orthopedic surgery with ORIF 4/14. Pt is to be nonweight bearing to extremity for at least two months, PT evaluation recommended SNF for continued rehab. Consideration of bisphosphonate initiation as outpatient can be discussed on follow up. Pt Hgb down to 8.2, within expected for post-op, will continue to monitor.    --Pain control: Oxy 5-10 mg q4hr prn (none needed last 48 hrs), robaxin 500 q6hr prn --Lovenox   --Bowel regimen: Colace 100 mg BID, schedule miralax, mag citrate available prn   --Cont PT/OT --CBC --SW for SNF placement, appreciate assistance    Hypokalemia, Hypomagnesemia Noted on additional labs with appropriate repletion ordered since that time, K continues to be low this am. Have held home thiazide diuretic, though she did get a dose yesterday before med discontinued on rounds. --Repeat BMP in pm and replete as indicated  H/o HTN On outpatient regimen of amlodipine 10  mg, atenolol-chlorthalidone 100-25 mg. Currently normotensive. --Hold home chlorthalidone as above   Diabetes Previous documentation of pre-diabetes, though last A1c on chart review 6.8. Not currently on outpatient regimen. Will monitor and use SSI while inpatient, likely start Metformin on discharge with further adjustments made as outpatient.  --SSI TID ac  Dispo: Anticipated discharge pending SNF placement   Tawny Asal, MD 04/26/2017, 8:41 AM Pager: [phone]

## 2017-04-26 NOTE — Telephone Encounter (Signed)
Physical therapy is aware.

## 2017-04-26 NOTE — Progress Notes (Signed)
Pt removed IV in left forearm. New IV placed in right forearm

## 2017-04-26 NOTE — Progress Notes (Signed)
Patient refused BNP lab draw, and refused to eat lunch. MD notified.

## 2017-04-26 NOTE — Progress Notes (Signed)
CRITICAL VALUE ALERT  Critical Value:  2.7 potassium  Date & Time Notied:  04/26/17 7:40 am  Provider Notified: [facility]  Orders Received/Actions taken: awaiting interventions

## 2017-04-26 NOTE — Progress Notes (Signed)
Patient agreed to BNP lab draw. Lab draw completed

## 2017-04-27 ENCOUNTER — Inpatient Hospital Stay (HOSPITAL_COMMUNITY): Payer: BLUE CROSS/BLUE SHIELD

## 2017-04-27 LAB — BASIC METABOLIC PANEL
ANION GAP: 10 (ref 5–15)
BUN: 10 mg/dL (ref 6–20)
CHLORIDE: 99 mmol/L — AB (ref 101–111)
CO2: 27 mmol/L (ref 22–32)
Calcium: 8.8 mg/dL — ABNORMAL LOW (ref 8.9–10.3)
Creatinine, Ser: 0.94 mg/dL (ref 0.44–1.00)
GFR calc Af Amer: >60 mL/min (ref >60)
GFR calc non Af Amer: >60 mL/min (ref >60)
Glucose, Bld: 143 mg/dL — ABNORMAL HIGH (ref 65–99)
POTASSIUM: 3.4 mmol/L — AB (ref 3.5–5.1)
SODIUM: 136 mmol/L (ref 135–145)

## 2017-04-27 LAB — GLUCOSE, CAPILLARY
GLUCOSE-CAPILLARY: 122 mg/dL — AB (ref 65–99)
GLUCOSE-CAPILLARY: 135 mg/dL — AB (ref 65–99)
Glucose-Capillary: 141 mg/dL — ABNORMAL HIGH (ref 65–99)
Glucose-Capillary: 110 mg/dL — ABNORMAL HIGH (ref 65–99)

## 2017-04-27 LAB — CBC
HEMATOCRIT: 25 % — AB (ref 36.0–46.0)
HEMOGLOBIN: 8 g/dL — AB (ref 12.0–15.0)
MCH: 28.5 pg (ref 26.0–34.0)
MCHC: 32 g/dL (ref 30.0–36.0)
MCV: 89 fL (ref 78.0–100.0)
Platelets: 258 10*3/uL (ref 150–400)
RBC: 2.81 MIL/uL — AB (ref 3.87–5.11)
RDW: 14.3 % (ref 11.5–15.5)
WBC: 7.2 10*3/uL (ref 4.0–10.5)

## 2017-04-27 MED ORDER — POTASSIUM CHLORIDE CRYS ER 20 MEQ PO TBCR
40.0000 meq | EXTENDED_RELEASE_TABLET | ORAL | Status: AC
  Administered 2017-04-27 (×2): 40 meq via ORAL
  Filled 2017-04-27 (×2): qty 2

## 2017-04-27 NOTE — Social Work (Signed)
CSW contacted SNF and sent them updated PT notes so they can obtain Insurance Auth.  CSW f/u on same for disposition.  Elissa Hefty, LCSW Clinical Social Worker [phone]

## 2017-04-27 NOTE — Progress Notes (Addendum)
Nurse tech, Manuela Schwartz, entered patient's room around 4:25pm and noted that patient was on floor. Patient was previously in the chair and she stated she was trying to walk to the bathroom (without walker). Pt denies any pain, denies hitting head. Patient assisted back to bed by nurse tech, Manuela Schwartz. Pt stated she did not want to call for help because she thought she could do it herself. MD was paged, patient did not want family called. VSS post-fall - BP 121/71 (BP Location: Right Arm)   Pulse 86   Temp 99.5 F (37.5 C) (Oral)   Resp 18   Ht 5\' 3"  (1.6 m)   Wt 86.2 kg (190 lb)   SpO2 94%   BMI 33.66 kg/m    Addendum @ 2449: RN was in room charting when patient stated, "My left hand kind of hurts, I think I landed on it when I fell." Pt had previously denied pain. Spoke with Dr. Johny Chess who stated he will be up to bedside shortly to evaluate patient.

## 2017-04-27 NOTE — Progress Notes (Signed)
Physical Therapy Treatment Patient Details Name: Christine Rocha MRN: [id number] DOB: [birth year] Today's Date: 04/27/2017    History of Present Illness Pt is a 62 y.o. female s/p L ORIF for L femur fracture after pt fell at home while cleaning. PMH includes HTN, pre DM, and seizures.    PT Comments    Continuing work on functional mobility and activity tolerance;  Excellent progress with gait distance and therapeutic exercise; Good maintenance of NWB with ambulation; Overall progressing well; Anticipate continuing good progress at post-acute rehabilitation.    Follow Up Recommendations  SNF     Equipment Recommendations  Rolling walker with 5" wheels;3in1 (PT)    Recommendations for Other Services       Precautions / Restrictions Precautions Required Braces or Orthoses: Knee Immobilizer - Left(Per MD; ok for ranging knee) Knee Immobilizer - Left: (noted ROM is ok) Restrictions LLE Weight Bearing: Non weight bearing    Mobility  Bed Mobility Overal bed mobility: Needs Assistance Bed Mobility: Supine to Sit     Supine to sit: Min guard     General bed mobility comments: Inefficient movement and heavy use of bed rails  Transfers Overall transfer level: Needs assistance Equipment used: Rolling walker (2 wheeled) Transfers: Sit to/from Stand Sit to Stand: Min assist;From elevated surface         General transfer comment: Min assist to steady RW as pt tended to pul up on it; first attempt from low bed unsuccessful; better with bed elevated  Ambulation/Gait Ambulation/Gait assistance: Min guard Ambulation Distance (Feet): 15 Feet Assistive device: Rolling walker (2 wheeled) Gait Pattern/deviations: (hop-to pattern)     General Gait Details: Excellent maintenance of NWB LLE; fatigue in UEs    Stairs             Wheelchair Mobility    Modified Rankin (Stroke Patients Only)       Balance                                             Cognition Arousal/Alertness: Awake/alert Behavior During Therapy: WFL for tasks assessed/performed Overall Cognitive Status: Within Functional Limits for tasks assessed                                        Exercises Total Joint Exercises Ankle Circles/Pumps: AROM;Both;10 reps Quad Sets: AROM;Left;10 reps Towel Squeeze: AROM;Both;10 reps Short Arc Quad: AROM;Left;10 reps Heel Slides: AAROM;Left;10 reps Hip ABduction/ADduction: AAROM;Left;10 reps Straight Leg Raises: AAROM;AROM;Left;10 reps Goniometric ROM: approx 4-50 deg    General Comments        Pertinent Vitals/Pain Pain Assessment: Faces Faces Pain Scale: Hurts whole lot Pain Location: L knee with flexion Pain Descriptors / Indicators: Grimacing Pain Intervention(s): Monitored during session    Home Living                      Prior Function            PT Goals (current goals can now be found in the care plan section) Acute Rehab PT Goals Patient Stated Goal: to go home PT Goal Formulation: With patient Time For Goal Achievement: 05/09/17 Potential to Achieve Goals: Good Progress towards PT goals: Progressing toward goals    Frequency    Min 3X/week  PT Plan Current plan remains appropriate    Co-evaluation              AM-PAC PT "6 Clicks" Daily Activity  Outcome Measure  Difficulty turning over in bed (including adjusting bedclothes, sheets and blankets)?: A Little Difficulty moving from lying on back to sitting on the side of the bed? : A Little Difficulty sitting down on and standing up from a chair with arms (e.g., wheelchair, bedside commode, etc,.)?: Unable Help needed moving to and from a bed to chair (including a wheelchair)?: A Little Help needed walking in hospital room?: A Little Help needed climbing 3-5 steps with a railing? : A Lot 6 Click Score: 15    End of Session Equipment Utilized During Treatment: Gait belt;Left knee immobilizer Activity  Tolerance: Patient tolerated treatment well;Patient limited by fatigue Patient left: in chair;with call bell/phone within reach Nurse Communication: Mobility status PT Visit Diagnosis: Muscle weakness (generalized) (M62.81);Unsteadiness on feet (R26.81);Other abnormalities of gait and mobility (R26.89);Pain Pain - Right/Left: Left Pain - part of body: Leg     Time: 8295-6213 PT Time Calculation (min) (ACUTE ONLY): 21 min  Charges:  $Gait Training: 8-22 mins                    G Codes:       Roney Marion, PT  Acute Rehabilitation Services Pager [phone] [facility] 04/27/2017, 12:20 PM

## 2017-04-27 NOTE — Progress Notes (Signed)
   Subjective: No acute events overnight, denies any increase in pain or other issues with left lower extremity.  Potassium improved this morning.  Objective:  Vital signs in last 24 hours: Vitals:   04/26/17 0527 04/26/17 1430 04/26/17 1948 04/27/17 0441  BP: (!) 93/52 (!) 111/57 111/69 106/68  Pulse: 81 88 93 80  Resp: 18 16 16 16   Temp: 98 F (36.7 C) 99.1 F (37.3 C) 99.6 F (37.6 C) 98.4 F (36.9 C)  TempSrc: Oral Oral Oral Oral  SpO2: 100% 100% 94% 94%  Weight:      Height:       General: Resting in bed, no acute distress CV: RRR, no murmur appreciated Resp: Clear anterior breath sounds, normal work of breathing, no distress  Abd: Soft, +BS, non-tender  Extr: Immobilizer in place to L leg with dressing underneath, good peripheral pulses  Neuro: Alert and oriented x3  Skin: Warm, dry    Assessment/Plan:  L Femur Fracture Pt presented with L leg pain following mechanical fall and found to have comminuted L distal femur fracture, repaired by orthopedic surgery with ORIF 4/14. Pt is to be nonweight bearing to extremity for at least two months, PT evaluation recommended SNF for continued rehab. Consideration of bisphosphonate initiation as outpatient can be discussed on follow up. Pt Hgb down to 8.0, within expected for post-op, will continue to monitor.    --Pain control: Oxy 5-10 mg q4hr prn (minimal requirements thus far), robaxin 500 q6hr prn --Lovenox, plan for 30 days while at facility --Bowel regimen: Colace 100 mg BID, schedule miralax, mag citrate available prn   --Cont PT/OT --CBC --Appreciate SW assistance for SNF placement  Hypokalemia, Hypomagnesemia Noted on admission labs with appropriate repletion ordered since that time, potassium has remained more stable with cessation of home thiazide diuretic. --BMP  H/o HTN On outpatient regimen of amlodipine 10 mg, atenolol-chlorthalidone 100-25 mg. Currently normotensive. --Hold home chlorthalidone as above    Diabetes Previous documentation of pre-diabetes, though last A1c on chart review 6.8. Not currently on outpatient regimen. Will monitor and use SSI while inpatient, likely start Metformin on discharge with further adjustments made as outpatient.  --SSI TID ac  Dispo: Anticipated discharge pending SNF placement   Tawny Asal, MD 04/27/2017, 9:02 AM Pager: [phone]

## 2017-04-27 NOTE — Progress Notes (Signed)
Paged after patient was found on the floor of her room due to a fall.  Evaluated patient who reports she was trying to get her walker and go to the bathroom.  She reports pushing the call button but had to go to the bathroom urgently and could not wait.  She reports she was off balance with nonweightbearing and without an assistive device, leading to a fall in which braced herself with her left hand.  She denies any trauma to her left leg, head, or other areas.  No pain apart from left hand pain.  Vital signs have remained stable, no increased pain to left leg or weightbearing during episode per patient report.  On exam, there is some tenderness to palpation of proximal second metacarpal on dorsal hand, no swelling, ecchymoses, or obvious deformity.  Pain with movement but full range of motion and neurovascularly intact.  Will obtain left hand x-ray to rule out fracture, though exam reassuring.  We will also discontinue Robaxin to limit medications that could contribute, though patient needing to wait for assistance is likely the main factor.  Patient encouraged to use call bell and not attempt ambulation or transfers without assistance in any circumstances.

## 2017-04-27 NOTE — Discharge Summary (Addendum)
Name: Christine Rocha MRN: [id number] DOB: Nov 28, 1955 62 y.o. PCP: Tresa Garter, MD  Date of Admission: 04/23/2017 11:17 AM Date of Discharge: 04/28/2017 Attending Physician: Aldine Contes, MD  Discharge Diagnosis: Active Problems:   Femur fracture, left ([place]) L Radial Fracture   Discharge Medications: Allergies as of 04/28/2017      Reactions   Penicillins       Medication List    STOP taking these medications   atenolol-chlorthalidone 100-25 MG tablet Commonly known as:  TENORETIC 100   ibuprofen 400 MG tablet Commonly known as:  ADVIL,MOTRIN   nicotine 14 mg/24hr patch Commonly known as:  NICODERM CQ - dosed in mg/24 hours   Potassium Chloride ER 20 MEQ Tbcr     TAKE these medications   acetaminophen 325 MG tablet Commonly known as:  TYLENOL Take 1-2 tablets (325-650 mg total) by mouth every 6 (six) hours as needed for mild pain (pain score 1-3 or temp > 100.5).   amLODipine 10 MG tablet Commonly known as:  NORVASC Take 1 tablet (10 mg total) by mouth daily.   atenolol 100 MG tablet Commonly known as:  TENORMIN Take 1 tablet (100 mg total) by mouth daily.   buPROPion 150 MG 12 hr tablet Commonly known as:  ZYBAN Take 1 tablet (150 mg total) by mouth 2 (two) times daily.   carboxymethylcellulose 0.5 % Soln Commonly known as:  REFRESH PLUS Place 1 drop into both eyes daily as needed (dry eyes).   enoxaparin 40 MG/0.4ML injection Commonly known as:  LOVENOX Inject 0.4 mLs (40 mg total) into the skin daily.   magnesium citrate Soln Take 296 mLs (1 Bottle total) by mouth once as needed for severe constipation.   metFORMIN 1000 MG tablet Commonly known as:  GLUCOPHAGE Take 0.5 tablets (500 mg total) by mouth 2 (two) times daily with a meal.   oxyCODONE 5 MG immediate release tablet Commonly known as:  Oxy IR/ROXICODONE Take 1-2 tablets (5-10 mg total) by mouth every 12 (twelve) hours as needed for up to 7 days for moderate pain or severe  pain.   polyethylene glycol packet Commonly known as:  MIRALAX / GLYCOLAX Take 17 g by mouth daily.   senna-docusate 8.6-50 MG tablet Commonly known as:  Senokot-S Take 1 tablet by mouth 2 (two) times daily.   WOMENS 50+ MULTI VITAMIN/MIN Tabs Take 1 tablet by mouth daily.       Disposition and follow-up:   Ms.Christine Rocha was discharged from [facility] in Stable condition.  At the hospital follow up visit please address:  1.  -Continue PT to improve functional status. Pt is non-weightbearing to Left Lower Extremity for at least two months. ROM exercises ok. --Continue velcro wrist splint for L wrist  -Keep ortho follow up for LLE and L wrist   -Continue Lovenox for thirty days (through May 15th)  -Consider addition of bisphosphonate 6 weeks post op (~May 26th) -Chlorthalidone discontinued due to hypokalemia. Assess BP control on follow up with amlodipine, atenolol regimen. Repeat BMP to assess K (prior home K supplementation discontinued with thiazide cessation)  -Metformin started on discharge as last A1c within diabetes range. Can up-titrate dose as indicated, repeat A1c    2.  Labs / imaging needed at time of follow-up: BMP, A1c  3.  Pending labs/ test needing follow-up: None   Follow-up Appointments:  Contact information for follow-up providers    [name], MD In 2 weeks.  Specialty:  Orthopedic Surgery Contact information: [place] [address] [phone]            Contact information for after-discharge care    Destination    [facility] .   Service:  Skilled Nursing Contact information: [address] [place] Hospital Course by problem list:   L Femur Fracture Patient presented with left leg pain following a mechanical fall on a slippery tile floor and was found to have a comminuted left distal femur  fracture which was repaired by orthopedic surgery with ORIF 4/14.  She is to be nonweightbearing to this extremity for at least 2 months with ongoing evaluation as outpatient with orthopedic.  PT evaluation recommended SNF for continued rehab.  Lovenox for DVT prophylaxis following surgery should be continued for 30 days.  On outpatient follow-up, can consider initiation of bisphosphonate therapy after roughly 6 weeks postop.  Patient's hemoglobin down trended slightly but within expected for postoperative period and rebounded appropriately, 8.7 upon discharge.  Patient discharged with 7-day course of narcotic pain medication for pain control as well as bowel regimen to avoid opioid-induced constipation over this time.  Hypokalemia, Hypomagnesemia Found to be hypokalemic and hypomagnesemic on admission labs.  These were repleted as indicated and normalized after cessation of home thiazide diuretic.  She was previously on potassium supplementation on outpatient med list but this was dc'ed with discontinuation of chlorthalidone.  L Distal Radial Fracture While in the hospital, patient had a mechanical fall while trying to ambulate to the restroom without assistance and had left hand pain as a result.  There are no changes to her left leg pain or exam.  Left hand x-ray revealed a nondisplaced distal radial fracture.  Discussed with orthopedics who recommended a Velcro wrist splint with follow-up in the clinic.  This was ordered and applied prior to discharge.  H/o HTN Prior to admission, on outpatient regimen of amlodipine 10 mg, atenolol-chlorthalidone 100-25 mg.  Regimen initially continued and then chlorthalidone removed as above.  She remained normotensive and was discharged on amlodipine 10 mg daily, atenolol 100 mg.  H/o Diabetes Previous documentation of prediabetes, the last A1c on chart review 6 months ago was 6.8.  She is not on an outpatient diabetic regimen prior to admission.  SSI was used  while inpatient and metformin started upon discharge at 500 mg twice daily.  Further adjustments to diabetic regimen can be made as an outpatient with PCP follow-up.  Discharge Vitals:   BP 109/62 (BP Location: Right Arm)   Pulse 73   Temp 99.2 F (37.3 C) (Oral)   Resp 16   Ht 5\' 3"  (1.6 m)   Wt 190 lb (86.2 kg)   SpO2 96%   BMI 33.66 kg/m   Pertinent Labs, Studies, and Procedures:  BMP Latest Ref Rng & Units 04/28/2017 04/27/2017 04/26/2017  Glucose 65 - 99 mg/dL 136(H) 143(H) 118(H)  BUN 6 - 20 mg/dL 8 10 7   Creatinine 0.44 - 1.00 mg/dL 0.90 0.94 1.02(H)  BUN/Creat Ratio 9 - 23 - - -  Sodium 135 - 145 mmol/L 136 136 136  Potassium 3.5 - 5.1 mmol/L 3.9 3.4(L) 3.7  Chloride 101 - 111 mmol/L 102 99(L) 100(L)  CO2 22 - 32 mmol/L 25 27 28   Calcium 8.9 - 10.3 mg/dL 8.9 8.8(L) 8.8(L)   L  Knee XR: Comminuted displaced distal femoral diaphysis fracture with a moderate suprapatellar joint effusion. Degenerative changes in the knee.  4/14: Successful ORIF with plate and compression screws   L Hand XR: Acute, nondisplaced intra-articular fracture of the distal radius extending into the radiocarpal joint. 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 Degenerative cystic change of the scaphoid and lunate.   Discharge Instructions: Discharge Instructions    Discharge instructions   Complete by:  As directed    Nice to meet you Ms. Moradi. -Continue to work with rehab at the nursing facility - After leaving there, be sure to make an appointment with Dr. Sharol Given and your primary care doctor -We adjusted your blood pressure medicines because of your potassium.  For now, just take atenolol and amlodipine - We also started a medicine for your blood sugar called metformin.  These medicine changes to your blood pressure and diabetes can be further discussed and adjusted with your primary care doctor      Signed: Tawny Asal, MD 04/28/2017, 1:26 PM   Pager: [phone]

## 2017-04-27 NOTE — Social Work (Addendum)
CSW met with patient at bedside to discuss SNF offers. Pt accepted bed offer form [facility]. CSW confirmed bed offer with SNF. SNF will initiate Insurance Auth.  CSW will continue to follow for disposition.  11:18am: SNF advised that [facility] requesting up to date PT notes. CSW messaged therapist.  CSW will continue to follow up.  Elissa Hefty, LCSW Clinical Social Worker [phone]

## 2017-04-28 DIAGNOSIS — S6292XD Unspecified fracture of left wrist and hand, subsequent encounter for fracture with routine healing: Secondary | ICD-10-CM | POA: Diagnosis not present

## 2017-04-28 DIAGNOSIS — E119 Type 2 diabetes mellitus without complications: Secondary | ICD-10-CM | POA: Diagnosis not present

## 2017-04-28 DIAGNOSIS — S52502A Unspecified fracture of the lower end of left radius, initial encounter for closed fracture: Secondary | ICD-10-CM | POA: Diagnosis not present

## 2017-04-28 DIAGNOSIS — S8991XA Unspecified injury of right lower leg, initial encounter: Secondary | ICD-10-CM | POA: Diagnosis not present

## 2017-04-28 DIAGNOSIS — M6281 Muscle weakness (generalized): Secondary | ICD-10-CM | POA: Diagnosis not present

## 2017-04-28 DIAGNOSIS — S72413R Displaced unspecified condyle fracture of lower end of unspecified femur, subsequent encounter for open fracture type IIIA, IIIB, or IIIC with malunion: Secondary | ICD-10-CM | POA: Diagnosis not present

## 2017-04-28 DIAGNOSIS — Z7984 Long term (current) use of oral hypoglycemic drugs: Secondary | ICD-10-CM

## 2017-04-28 DIAGNOSIS — E876 Hypokalemia: Secondary | ICD-10-CM | POA: Diagnosis not present

## 2017-04-28 DIAGNOSIS — G8911 Acute pain due to trauma: Secondary | ICD-10-CM | POA: Diagnosis not present

## 2017-04-28 DIAGNOSIS — R2689 Other abnormalities of gait and mobility: Secondary | ICD-10-CM | POA: Diagnosis not present

## 2017-04-28 DIAGNOSIS — S52515A Nondisplaced fracture of left radial styloid process, initial encounter for closed fracture: Secondary | ICD-10-CM | POA: Diagnosis not present

## 2017-04-28 DIAGNOSIS — M79605 Pain in left leg: Secondary | ICD-10-CM | POA: Diagnosis not present

## 2017-04-28 DIAGNOSIS — S72402A Unspecified fracture of lower end of left femur, initial encounter for closed fracture: Secondary | ICD-10-CM | POA: Diagnosis not present

## 2017-04-28 DIAGNOSIS — S52592D Other fractures of lower end of left radius, subsequent encounter for closed fracture with routine healing: Secondary | ICD-10-CM | POA: Diagnosis not present

## 2017-04-28 DIAGNOSIS — I10 Essential (primary) hypertension: Secondary | ICD-10-CM | POA: Diagnosis not present

## 2017-04-28 DIAGNOSIS — G4700 Insomnia, unspecified: Secondary | ICD-10-CM | POA: Diagnosis not present

## 2017-04-28 DIAGNOSIS — R262 Difficulty in walking, not elsewhere classified: Secondary | ICD-10-CM | POA: Diagnosis not present

## 2017-04-28 DIAGNOSIS — W19XXXA Unspecified fall, initial encounter: Secondary | ICD-10-CM | POA: Diagnosis not present

## 2017-04-28 DIAGNOSIS — S72442D Displaced fracture of lower epiphysis (separation) of left femur, subsequent encounter for closed fracture with routine healing: Secondary | ICD-10-CM | POA: Diagnosis not present

## 2017-04-28 LAB — BASIC METABOLIC PANEL
ANION GAP: 9 (ref 5–15)
BUN: 8 mg/dL (ref 6–20)
CO2: 25 mmol/L (ref 22–32)
Calcium: 8.9 mg/dL (ref 8.9–10.3)
Chloride: 102 mmol/L (ref 101–111)
Creatinine, Ser: 0.9 mg/dL (ref 0.44–1.00)
Glucose, Bld: 136 mg/dL — ABNORMAL HIGH (ref 65–99)
Potassium: 3.9 mmol/L (ref 3.5–5.1)
Sodium: 136 mmol/L (ref 135–145)

## 2017-04-28 LAB — CBC
HEMATOCRIT: 26.6 % — AB (ref 36.0–46.0)
Hemoglobin: 8.7 g/dL — ABNORMAL LOW (ref 12.0–15.0)
MCH: 29.4 pg (ref 26.0–34.0)
MCHC: 32.7 g/dL (ref 30.0–36.0)
MCV: 89.9 fL (ref 78.0–100.0)
PLATELETS: 316 10*3/uL (ref 150–400)
RBC: 2.96 MIL/uL — AB (ref 3.87–5.11)
RDW: 14.7 % (ref 11.5–15.5)
WBC: 6.9 10*3/uL (ref 4.0–10.5)

## 2017-04-28 LAB — GLUCOSE, CAPILLARY
Glucose-Capillary: 132 mg/dL — ABNORMAL HIGH (ref 65–99)
Glucose-Capillary: 107 mg/dL — ABNORMAL HIGH (ref 65–99)

## 2017-04-28 MED ORDER — POLYETHYLENE GLYCOL 3350 17 G PO PACK: 17.0000 g | PACK | Freq: Every day | ORAL | 0 refills | Status: DC

## 2017-04-28 MED ORDER — SENNOSIDES-DOCUSATE SODIUM 8.6-50 MG PO TABS: 1.0000 | ORAL_TABLET | Freq: Two times a day (BID) | ORAL | Status: DC

## 2017-04-28 MED ORDER — ENOXAPARIN SODIUM 40 MG/0.4ML ~~LOC~~ SOLN: 40.0000 mg | SUBCUTANEOUS | Status: DC

## 2017-04-28 MED ORDER — OXYCODONE HCL 5 MG PO TABS: 5.0000 mg | ORAL_TABLET | Freq: Two times a day (BID) | ORAL | 0 refills | Status: AC | PRN

## 2017-04-28 MED ORDER — METFORMIN HCL 1000 MG PO TABS: 500.0000 mg | ORAL_TABLET | Freq: Two times a day (BID) | ORAL | 0 refills | Status: DC

## 2017-04-28 MED ORDER — MAGNESIUM CITRATE PO SOLN: 1.0000 | Freq: Once | ORAL | Status: DC | PRN

## 2017-04-28 MED ORDER — ACETAMINOPHEN 325 MG PO TABS: 325.0000 mg | ORAL_TABLET | Freq: Four times a day (QID) | ORAL | Status: DC | PRN

## 2017-04-28 MED ORDER — ATENOLOL 100 MG PO TABS: 100.0000 mg | ORAL_TABLET | Freq: Every day | ORAL | 0 refills | Status: DC

## 2017-04-28 NOTE — Progress Notes (Signed)
Orthopedic Tech Progress Note Patient Details:  Christine Rocha 1955/03/04 [id number]  Ortho Devices Type of Ortho Device: Velcro wrist splint Ortho Device/Splint Location: lue Ortho Device/Splint Interventions: Application   Post Interventions Patient Tolerated: Well Instructions Provided: Care of device   Hildred Priest 04/28/2017, 1:58 PM

## 2017-04-28 NOTE — Social Work (Signed)
CSW was advised by SNF that they have Insurance Auth for SNF placement.  CSW will f/u for disposition.  Elissa Hefty, LCSW Clinical Social Worker [phone]

## 2017-04-28 NOTE — Progress Notes (Signed)
   Subjective: Pt had a mechanical fall yesterday afternoon with L hand pain as a result, found to have small fracture. She states her leg pain is stable, no new swelling or pain. Hand/wrist pain also similar to yesterday, keeping propped on pillow. She states she has still not had bowel movement.   Objective:  Vital signs in last 24 hours: Vitals:   04/27/17 1629 04/27/17 2017 04/27/17 2019 04/28/17 0900  BP: 121/71 110/64 110/64 (!) 114/59  Pulse: 86 84 83 83  Resp: 18 13 13    Temp: 99.5 F (37.5 C) 99.2 F (37.3 C) 99.2 F (37.3 C)   TempSrc: Oral Oral Oral   SpO2: 94% 91% 92% 96%  Weight:      Height:       General: Resting in bed, no acute distress CV: RRR, no murmur appreciated Resp: Clear anterior breath sounds, normal work of breathing, no distress  Abd: Soft, +BS, non-tender  Extr: Coban wrap to lower extremity, no obvious swelling or hematoma, no increased tenderness, good peripheral pulses. Mild tenderness to palpation to distal wrist/proximal hand, neurovascularly intact, no swelling or ecchymosis   Neuro: Alert and oriented x3  Skin: Warm, dry    Assessment/Plan:  L Femur Fracture Pt presented with L leg pain following mechanical fall and found to have comminuted L distal femur fracture, repaired by orthopedic surgery with ORIF 4/14. Pt is to be nonweight bearing to extremity for at least two months, PT evaluation recommended SNF for continued rehab. Consideration of bisphosphonate initiation as outpatient can be discussed on follow up.   --Pain control: Oxy 5-10 mg q4hr prn (minimal requirements thus far) --Lovenox, plan for 30 days while at facility --Bowel regimen: Colace 100 mg BID, schedule miralax, mag citrate available prn--administer mg citrate today   --Cont PT/OT --Appreciate SW assistance for SNF placement-stable for dc today  L Distal Radius Fracture While inpatient, patient had a mechanical fall while trying to ambulate to the restroom without  assistance.  She had left hand pain from breaking her fall as a result and x-rays showed a small distal radial fracture.  Discussed with Ortho who recommended a Velcro wrist splint with follow-up in clinic (established for above). --Velcro wrist splint  Hypokalemia, Hypomagnesemia, resolved Noted on admission labs with appropriate repletion ordered since that time, potassium has remained stable and now wnl with cessation of home thiazide diuretic.  H/o HTN On outpatient regimen of amlodipine 10 mg, atenolol-chlorthalidone 100-25 mg. Currently normotensive. --Hold home chlorthalidone as above, dc on atenolol, amlodipine    Diabetes Previous documentation of pre-diabetes, though last A1c on chart review 6.8. Not currently on outpatient regimen. Will monitor and use SSI while inpatient, likely start Metformin on discharge with further adjustments made as outpatient.  --SSI TID ac  Dispo: Anticipated discharge pending SNF placement   Tawny Asal, MD 04/28/2017, 11:00 AM Pager: [phone]

## 2017-04-28 NOTE — Social Work (Signed)
Clinical Social Worker facilitated patient discharge including contacting patient family and facility to confirm patient discharge plans.  Clinical information faxed to facility and family agreeable with plan.    CSW arranged ambulance transport via [facility] to [facility] and [place].    RN to call [phone] to give report prior to discharge. Pt going to [facility].  Clinical Social Worker will sign off for now as social work intervention is no longer needed. Please consult [place] again if new need arises.  Elissa Hefty, LCSW Clinical Social Worker [phone]

## 2017-04-28 NOTE — Clinical Social Work Placement (Signed)
   CLINICAL SOCIAL WORK PLACEMENT  NOTE  Date:  04/28/2017  Patient Details  Name: Christine Rocha MRN: [id number] Date of Birth: November 20, 1955  Clinical Social Work is seeking post-discharge placement for this patient at the [place] level of care (*CSW will initial, date and re-position this form in  chart as items are completed):  Yes   Patient/family provided with [place] Work Department's list of facilities offering this level of care within the geographic area requested by the patient (or if unable, by the patient's family).  Yes   Patient/family informed of their freedom to choose among providers that offer the needed level of care, that participate in Medicare, Medicaid or managed care program needed by the patient, have an available bed and are willing to accept the patient.  Yes   Patient/family informed of [facility]'s ownership interest in [facility] and [facility], as well as of the fact that they are under no obligation to receive care at these facilities.  PASRR submitted to EDS on       PASRR number received on 04/25/17     Existing PASRR number confirmed on       FL2 transmitted to all facilities in geographic area requested by pt/family on 04/25/17     FL2 transmitted to all facilities within larger geographic area on       Patient informed that his/her managed care company has contracts with or will negotiate with certain facilities, including the following:        Yes   Patient/family informed of bed offers received.  Patient chooses bed at [facility]     Physician recommends and patient chooses bed at      Patient to be transferred to [facility] on 04/28/17.  Patient to be transferred to facility by [facility]     Patient family notified on 04/28/17 of transfer.  Name of family member notified:  pt responsible for self     PHYSICIAN       Additional  Comment:    _______________________________________________ Normajean Baxter, LCSW 04/28/2017, 1:33 PM

## 2017-04-28 NOTE — Progress Notes (Signed)
This nurse attempted to call report to [facility].  Placed on hold x 10 minutes, report not given.  Will re attempt.  [id number]

## 2017-04-28 NOTE — Progress Notes (Addendum)
Pt discharged to [facility] in stable condition.  All Rx's and discharge instructions given to transport as pt declined to review them. Pt transported via stretcher, with transporters x2.

## 2017-05-02 DIAGNOSIS — M79605 Pain in left leg: Secondary | ICD-10-CM | POA: Diagnosis not present

## 2017-05-02 DIAGNOSIS — I10 Essential (primary) hypertension: Secondary | ICD-10-CM | POA: Diagnosis not present

## 2017-05-02 DIAGNOSIS — S52592D Other fractures of lower end of left radius, subsequent encounter for closed fracture with routine healing: Secondary | ICD-10-CM | POA: Diagnosis not present

## 2017-05-02 DIAGNOSIS — S72442D Displaced fracture of lower epiphysis (separation) of left femur, subsequent encounter for closed fracture with routine healing: Secondary | ICD-10-CM | POA: Diagnosis not present

## 2017-05-02 DIAGNOSIS — S6292XD Unspecified fracture of left wrist and hand, subsequent encounter for fracture with routine healing: Secondary | ICD-10-CM | POA: Diagnosis not present

## 2017-05-02 DIAGNOSIS — E119 Type 2 diabetes mellitus without complications: Secondary | ICD-10-CM | POA: Diagnosis not present

## 2017-05-02 DIAGNOSIS — S72402A Unspecified fracture of lower end of left femur, initial encounter for closed fracture: Secondary | ICD-10-CM | POA: Diagnosis not present

## 2017-05-02 DIAGNOSIS — R2689 Other abnormalities of gait and mobility: Secondary | ICD-10-CM | POA: Diagnosis not present

## 2017-05-04 DIAGNOSIS — S72402A Unspecified fracture of lower end of left femur, initial encounter for closed fracture: Secondary | ICD-10-CM | POA: Diagnosis not present

## 2017-05-04 DIAGNOSIS — R2689 Other abnormalities of gait and mobility: Secondary | ICD-10-CM | POA: Diagnosis not present

## 2017-05-04 DIAGNOSIS — S6292XD Unspecified fracture of left wrist and hand, subsequent encounter for fracture with routine healing: Secondary | ICD-10-CM | POA: Diagnosis not present

## 2017-05-04 DIAGNOSIS — G4700 Insomnia, unspecified: Secondary | ICD-10-CM | POA: Diagnosis not present

## 2017-05-05 DIAGNOSIS — E119 Type 2 diabetes mellitus without complications: Secondary | ICD-10-CM | POA: Diagnosis not present

## 2017-05-05 DIAGNOSIS — S72442D Displaced fracture of lower epiphysis (separation) of left femur, subsequent encounter for closed fracture with routine healing: Secondary | ICD-10-CM | POA: Diagnosis not present

## 2017-05-05 DIAGNOSIS — S52592D Other fractures of lower end of left radius, subsequent encounter for closed fracture with routine healing: Secondary | ICD-10-CM | POA: Diagnosis not present

## 2017-05-05 DIAGNOSIS — I10 Essential (primary) hypertension: Secondary | ICD-10-CM | POA: Diagnosis not present

## 2017-05-09 DIAGNOSIS — E119 Type 2 diabetes mellitus without complications: Secondary | ICD-10-CM | POA: Diagnosis not present

## 2017-05-09 DIAGNOSIS — I10 Essential (primary) hypertension: Secondary | ICD-10-CM | POA: Diagnosis not present

## 2017-05-09 DIAGNOSIS — S52592D Other fractures of lower end of left radius, subsequent encounter for closed fracture with routine healing: Secondary | ICD-10-CM | POA: Diagnosis not present

## 2017-05-09 DIAGNOSIS — S72442D Displaced fracture of lower epiphysis (separation) of left femur, subsequent encounter for closed fracture with routine healing: Secondary | ICD-10-CM | POA: Diagnosis not present

## 2017-05-10 ENCOUNTER — Ambulatory Visit (INDEPENDENT_AMBULATORY_CARE_PROVIDER_SITE_OTHER): Payer: BLUE CROSS/BLUE SHIELD

## 2017-05-10 ENCOUNTER — Ambulatory Visit (INDEPENDENT_AMBULATORY_CARE_PROVIDER_SITE_OTHER): Payer: BLUE CROSS/BLUE SHIELD | Admitting: Orthopedic Surgery

## 2017-05-10 ENCOUNTER — Telehealth (INDEPENDENT_AMBULATORY_CARE_PROVIDER_SITE_OTHER): Payer: Self-pay | Admitting: Orthopedic Surgery

## 2017-05-10 ENCOUNTER — Encounter (INDEPENDENT_AMBULATORY_CARE_PROVIDER_SITE_OTHER): Payer: Self-pay | Admitting: Orthopedic Surgery

## 2017-05-10 VITALS — Ht 63.0 in | Wt 190.0 lb

## 2017-05-10 DIAGNOSIS — M79605 Pain in left leg: Secondary | ICD-10-CM

## 2017-05-10 DIAGNOSIS — S72352A Displaced comminuted fracture of shaft of left femur, initial encounter for closed fracture: Secondary | ICD-10-CM

## 2017-05-10 NOTE — Progress Notes (Signed)
Office Visit Note   Patient: Christine Rocha           Date of Birth: 1955-01-28           MRN: [id number] Visit Date: 05/10/2017              Requested by: Tresa Garter, MD [address] [place],  [address] PCP: Tresa Garter, MD  Chief Complaint  Patient presents with  . Left Leg - Routine Post Op    04/24/17 left ORIF femur fracture.       HPI: Patient is a 62 year old woman who is 2 weeks status post open reduction internal fixation left femur fracture which involves intercondylar extension metaphyseal fracture and diaphyseal fracture.  Assessment & Plan: Visit Diagnoses:  1. Pain in left leg   2. Closed displaced comminuted fracture of shaft of left femur, initial encounter ([place])     Plan: The staples are harvested she was given instructions for scar massage continue nonweightbearing she is to work on range of motion of the knee and strengthening for the left lower extremity follow-up in 2 weeks with repeat radiographs at which time we will start her with weightbearing.  Follow-Up Instructions: Return in about 2 weeks (around 05/24/2017).   Ortho Exam  Patient is alert, oriented, no adenopathy, well-dressed, normal affect, normal respiratory effort. Examination the incision is well-healed there is no redness no cellulitis no drainage.  We will discontinue the knee immobilizer at this time work on range of motion of the knee strengthening of the leg continue nonweightbearing.  Imaging: Xr Femur Min 2 Views Left  Result Date: 05/10/2017 2 view radiographs of the right femur show stable internal fixation of the intercondylar fracture of the left femur with metaphyseal diaphyseal fractures as well.  No images are attached to the encounter.  Labs: Lab Results  Component Value Date   HGBA1C 6.8 05/26/2016   HGBA1C 6.3 09/13/2012    @LABSALLVALUES (HGBA1)@  Body mass index is 33.66 kg/m.  Orders:  Orders Placed This Encounter  Procedures  .  XR FEMUR MIN 2 VIEWS LEFT   No orders of the defined types were placed in this encounter.    Procedures: No procedures performed  Clinical Data: No additional findings.  ROS:  All other systems negative, except as noted in the HPI. Review of Systems  Objective: Vital Signs: Ht 5\' 3"  (1.6 m)   Wt 190 lb (86.2 kg)   BMI 33.66 kg/m   Specialty Comments:  No specialty comments available.  PMFS History: Patient Active Problem List   Diagnosis Date Noted  . Femur fracture, left ([place]) 04/23/2017  . Preventative health care 10/28/2014  . Encounter for screening mammogram for breast cancer 10/28/2014  . Primary osteoarthritis of left knee 10/28/2014  . Essential hypertension 11/15/2013  . Prediabetes 11/15/2013  . Uncontrolled hypertension 10/11/2012  . Smoking addiction 10/11/2012   Past Medical History:  Diagnosis Date  . Hypertension   . Seizures ([place])     History reviewed. No pertinent family history.  Past Surgical History:  Procedure Laterality Date  . ORIF FEMUR FRACTURE Left 04/24/2017   Procedure: OPEN REDUCTION INTERNAL FIXATION (ORIF) DISTAL FEMUR FRACTURE;  Surgeon: [name], MD;  Location: [facility];  Service: Orthopedics;  Laterality: Left;   Social History   Occupational History  . Not on file  Tobacco Use  . Smoking status: Current Every Day Smoker    Packs/day: 0.25    Types: Cigarettes  .  Smokeless tobacco: Never Used  Substance and Sexual Activity  . Alcohol use: No  . Drug use: No  . Sexual activity: Never    Birth control/protection: None

## 2017-05-10 NOTE — Telephone Encounter (Signed)
Shauntel from [facility] called asking for verifications on some orders for the patient. CB # [phone]

## 2017-05-11 DIAGNOSIS — S72402A Unspecified fracture of lower end of left femur, initial encounter for closed fracture: Secondary | ICD-10-CM | POA: Diagnosis not present

## 2017-05-11 DIAGNOSIS — R2689 Other abnormalities of gait and mobility: Secondary | ICD-10-CM | POA: Diagnosis not present

## 2017-05-11 DIAGNOSIS — S6292XD Unspecified fracture of left wrist and hand, subsequent encounter for fracture with routine healing: Secondary | ICD-10-CM | POA: Diagnosis not present

## 2017-05-11 DIAGNOSIS — M79605 Pain in left leg: Secondary | ICD-10-CM | POA: Diagnosis not present

## 2017-05-11 NOTE — Telephone Encounter (Signed)
I called and sw [place] to advise pt non weight bearing on right but can work on ROM of the knee. She states that pt also sustained a hand injury and was wearing a brace and wanted to know if there was any instruction for them. There is nothing documented in the plan from her visit yesterday any orders for the SNF for this?

## 2017-05-12 ENCOUNTER — Telehealth (INDEPENDENT_AMBULATORY_CARE_PROVIDER_SITE_OTHER): Payer: Self-pay | Admitting: Orthopedic Surgery

## 2017-05-12 NOTE — Telephone Encounter (Signed)
May use wrist splint as needed

## 2017-05-12 NOTE — Telephone Encounter (Signed)
Tried calling was on hold for over 10 min. Advised reception I will try again later as nurse hs no voicemail.

## 2017-05-12 NOTE — Telephone Encounter (Signed)
Chris-PT assistant with [facility] called needing weight bearing status for patient's left wrist. The number to contact Gerald Stabs is [phone]

## 2017-05-12 NOTE — Telephone Encounter (Signed)
Patients husband walked in and he is requesting that Dr. Sharol Given call [facility] to see if they can extend her therapy. He said she had her staples removed and that she was discharged but she cannot walk and he has to go back to work and doesn't know what to do to help her since she cant get around. Husband Gerrit Friends # [phone]

## 2017-05-13 NOTE — Telephone Encounter (Signed)
Called and lm onvm to advise that if the pt is d/c from the facility then the social worker there will arrange for at home physical therapy and coordinate all of her needs upon discharge and if he has any questions to please cal the office.

## 2017-05-13 NOTE — Telephone Encounter (Signed)
Lm on vm to advise with therapy department.

## 2017-05-13 NOTE — Telephone Encounter (Signed)
Called and lm on v to advise wtbat on the left and splint as needed. I advised that I had tried to reach the pts nurse yesterday without success. To call with questions.

## 2017-05-16 DIAGNOSIS — S72442D Displaced fracture of lower epiphysis (separation) of left femur, subsequent encounter for closed fracture with routine healing: Secondary | ICD-10-CM | POA: Diagnosis not present

## 2017-05-16 DIAGNOSIS — S72413R Displaced unspecified condyle fracture of lower end of unspecified femur, subsequent encounter for open fracture type IIIA, IIIB, or IIIC with malunion: Secondary | ICD-10-CM | POA: Diagnosis not present

## 2017-05-16 DIAGNOSIS — S52515A Nondisplaced fracture of left radial styloid process, initial encounter for closed fracture: Secondary | ICD-10-CM | POA: Diagnosis not present

## 2017-05-16 DIAGNOSIS — S52592D Other fractures of lower end of left radius, subsequent encounter for closed fracture with routine healing: Secondary | ICD-10-CM | POA: Diagnosis not present

## 2017-05-16 DIAGNOSIS — I10 Essential (primary) hypertension: Secondary | ICD-10-CM | POA: Diagnosis not present

## 2017-05-16 DIAGNOSIS — R262 Difficulty in walking, not elsewhere classified: Secondary | ICD-10-CM | POA: Diagnosis not present

## 2017-05-16 DIAGNOSIS — E119 Type 2 diabetes mellitus without complications: Secondary | ICD-10-CM | POA: Diagnosis not present

## 2017-05-16 DIAGNOSIS — M6281 Muscle weakness (generalized): Secondary | ICD-10-CM | POA: Diagnosis not present

## 2017-05-18 DIAGNOSIS — R262 Difficulty in walking, not elsewhere classified: Secondary | ICD-10-CM | POA: Diagnosis not present

## 2017-05-18 DIAGNOSIS — M6281 Muscle weakness (generalized): Secondary | ICD-10-CM | POA: Diagnosis not present

## 2017-05-24 ENCOUNTER — Encounter (INDEPENDENT_AMBULATORY_CARE_PROVIDER_SITE_OTHER): Payer: Self-pay | Admitting: Orthopedic Surgery

## 2017-05-24 ENCOUNTER — Ambulatory Visit (INDEPENDENT_AMBULATORY_CARE_PROVIDER_SITE_OTHER): Payer: BLUE CROSS/BLUE SHIELD

## 2017-05-24 ENCOUNTER — Ambulatory Visit (INDEPENDENT_AMBULATORY_CARE_PROVIDER_SITE_OTHER): Payer: BLUE CROSS/BLUE SHIELD | Admitting: Orthopedic Surgery

## 2017-05-24 VITALS — Ht 63.0 in | Wt 190.0 lb

## 2017-05-24 DIAGNOSIS — S52592D Other fractures of lower end of left radius, subsequent encounter for closed fracture with routine healing: Secondary | ICD-10-CM | POA: Diagnosis not present

## 2017-05-24 DIAGNOSIS — S72352A Displaced comminuted fracture of shaft of left femur, initial encounter for closed fracture: Secondary | ICD-10-CM | POA: Diagnosis not present

## 2017-05-24 DIAGNOSIS — F17210 Nicotine dependence, cigarettes, uncomplicated: Secondary | ICD-10-CM | POA: Diagnosis not present

## 2017-05-24 DIAGNOSIS — Z7901 Long term (current) use of anticoagulants: Secondary | ICD-10-CM | POA: Diagnosis not present

## 2017-05-24 DIAGNOSIS — S72442D Displaced fracture of lower epiphysis (separation) of left femur, subsequent encounter for closed fracture with routine healing: Secondary | ICD-10-CM | POA: Diagnosis not present

## 2017-05-24 DIAGNOSIS — M1990 Unspecified osteoarthritis, unspecified site: Secondary | ICD-10-CM | POA: Diagnosis not present

## 2017-05-24 DIAGNOSIS — G4089 Other seizures: Secondary | ICD-10-CM | POA: Diagnosis not present

## 2017-05-24 DIAGNOSIS — Z9181 History of falling: Secondary | ICD-10-CM | POA: Diagnosis not present

## 2017-05-24 DIAGNOSIS — F329 Major depressive disorder, single episode, unspecified: Secondary | ICD-10-CM | POA: Diagnosis not present

## 2017-05-24 DIAGNOSIS — E119 Type 2 diabetes mellitus without complications: Secondary | ICD-10-CM | POA: Diagnosis not present

## 2017-05-24 DIAGNOSIS — Z7984 Long term (current) use of oral hypoglycemic drugs: Secondary | ICD-10-CM | POA: Diagnosis not present

## 2017-05-24 DIAGNOSIS — I10 Essential (primary) hypertension: Secondary | ICD-10-CM | POA: Diagnosis not present

## 2017-05-24 DIAGNOSIS — Z79891 Long term (current) use of opiate analgesic: Secondary | ICD-10-CM | POA: Diagnosis not present

## 2017-05-24 NOTE — Progress Notes (Signed)
Office Visit Note   Patient: Christine Rocha           Date of Birth: Sep 07, 1955           MRN: [id number] Visit Date: 05/24/2017              Requested by: Tresa Garter, MD [address] [place], [place] [address] PCP: Tresa Garter, MD  Chief Complaint  Patient presents with  . Left Leg - Routine Post Op    04/24/17 left ORIF distal femur fx       HPI: Patient is a 62 year old woman 4 weeks status post internal fixation for left segmental femur fracture.  Patient states she has been weightbearing with her walker around the house and up and down the driveway.  Assessment & Plan: Visit Diagnoses:  1. Closed displaced comminuted fracture of shaft of left femur, initial encounter [facility])     Plan: Patient will continue with minimizing her weightbearing.  Follow-up in 4 weeks with repeat 2 view radiographs of the left femur it was time anticipate she can be released to full weightbearing.  Follow-Up Instructions: Return in about 1 month (around 06/21/2017).   Ortho Exam  Patient is alert, oriented, no adenopathy, well-dressed, normal affect, normal respiratory effort. Examination patient has no pain with range of motion of the hip knee or ankle.  She is currently ambulating in a wheelchair.  The incision is well-healed.  She is asymptomatic.  Imaging: Xr Femur Min 2 Views Left  Result Date: 05/24/2017 2 view radiographs of the left femur show stable internal fixation there is some interval healing of the fracture with the metaphyseal fractures appear to be completely healed with still healing of the diaphyseal fracture.  No images are attached to the encounter.  Labs: Lab Results  Component Value Date   HGBA1C 6.8 05/26/2016   HGBA1C 6.3 09/13/2012     Lab Results  Component Value Date   ALBUMIN 3.2 (L) 04/24/2017   ALBUMIN 4.2 05/26/2016   ALBUMIN 4.4 02/25/2016    Body mass index is 33.66 kg/m.  Orders:  Orders Placed This Encounter    Procedures  . XR FEMUR MIN 2 VIEWS LEFT   No orders of the defined types were placed in this encounter.    Procedures: No procedures performed  Clinical Data: No additional findings.  ROS:  All other systems negative, except as noted in the HPI. Review of Systems  Objective: Vital Signs: Ht 5\' 3"  (1.6 m)   Wt 190 lb (86.2 kg)   BMI 33.66 kg/m   Specialty Comments:  No specialty comments available.  PMFS History: Patient Active Problem List   Diagnosis Date Noted  . Femur fracture, left ([place]) 04/23/2017  . Preventative health care 10/28/2014  . Encounter for screening mammogram for breast cancer 10/28/2014  . Primary osteoarthritis of left knee 10/28/2014  . Essential hypertension 11/15/2013  . Prediabetes 11/15/2013  . Uncontrolled hypertension 10/11/2012  . Smoking addiction 10/11/2012   Past Medical History:  Diagnosis Date  . Hypertension   . Seizures ([place])     History reviewed. No pertinent family history.  Past Surgical History:  Procedure Laterality Date  . ORIF FEMUR FRACTURE Left 04/24/2017   Procedure: OPEN REDUCTION INTERNAL FIXATION (ORIF) DISTAL FEMUR FRACTURE;  Surgeon: [name], MD;  Location: [facility];  Service: Orthopedics;  Laterality: Left;   Social History   Occupational History  . Not on file  Tobacco Use  . Smoking  status: Current Every Day Smoker    Packs/day: 0.25    Types: Cigarettes  . Smokeless tobacco: Never Used  Substance and Sexual Activity  . Alcohol use: No  . Drug use: No  . Sexual activity: Never    Birth control/protection: None

## 2017-05-25 DIAGNOSIS — S52592D Other fractures of lower end of left radius, subsequent encounter for closed fracture with routine healing: Secondary | ICD-10-CM | POA: Diagnosis not present

## 2017-05-25 DIAGNOSIS — S72442D Displaced fracture of lower epiphysis (separation) of left femur, subsequent encounter for closed fracture with routine healing: Secondary | ICD-10-CM | POA: Diagnosis not present

## 2017-05-25 DIAGNOSIS — F329 Major depressive disorder, single episode, unspecified: Secondary | ICD-10-CM | POA: Diagnosis not present

## 2017-05-25 DIAGNOSIS — Z7984 Long term (current) use of oral hypoglycemic drugs: Secondary | ICD-10-CM | POA: Diagnosis not present

## 2017-05-25 DIAGNOSIS — Z9181 History of falling: Secondary | ICD-10-CM | POA: Diagnosis not present

## 2017-05-25 DIAGNOSIS — E119 Type 2 diabetes mellitus without complications: Secondary | ICD-10-CM | POA: Diagnosis not present

## 2017-05-25 DIAGNOSIS — M1990 Unspecified osteoarthritis, unspecified site: Secondary | ICD-10-CM | POA: Diagnosis not present

## 2017-05-25 DIAGNOSIS — F17210 Nicotine dependence, cigarettes, uncomplicated: Secondary | ICD-10-CM | POA: Diagnosis not present

## 2017-05-25 DIAGNOSIS — I10 Essential (primary) hypertension: Secondary | ICD-10-CM | POA: Diagnosis not present

## 2017-05-25 DIAGNOSIS — G4089 Other seizures: Secondary | ICD-10-CM | POA: Diagnosis not present

## 2017-05-25 DIAGNOSIS — Z79891 Long term (current) use of opiate analgesic: Secondary | ICD-10-CM | POA: Diagnosis not present

## 2017-05-25 DIAGNOSIS — Z7901 Long term (current) use of anticoagulants: Secondary | ICD-10-CM | POA: Diagnosis not present

## 2017-05-27 DIAGNOSIS — F17210 Nicotine dependence, cigarettes, uncomplicated: Secondary | ICD-10-CM | POA: Diagnosis not present

## 2017-05-27 DIAGNOSIS — S72442D Displaced fracture of lower epiphysis (separation) of left femur, subsequent encounter for closed fracture with routine healing: Secondary | ICD-10-CM | POA: Diagnosis not present

## 2017-05-27 DIAGNOSIS — F329 Major depressive disorder, single episode, unspecified: Secondary | ICD-10-CM | POA: Diagnosis not present

## 2017-05-27 DIAGNOSIS — Z7984 Long term (current) use of oral hypoglycemic drugs: Secondary | ICD-10-CM | POA: Diagnosis not present

## 2017-05-27 DIAGNOSIS — I10 Essential (primary) hypertension: Secondary | ICD-10-CM | POA: Diagnosis not present

## 2017-05-27 DIAGNOSIS — G4089 Other seizures: Secondary | ICD-10-CM | POA: Diagnosis not present

## 2017-05-27 DIAGNOSIS — Z9181 History of falling: Secondary | ICD-10-CM | POA: Diagnosis not present

## 2017-05-27 DIAGNOSIS — Z7901 Long term (current) use of anticoagulants: Secondary | ICD-10-CM | POA: Diagnosis not present

## 2017-05-27 DIAGNOSIS — E119 Type 2 diabetes mellitus without complications: Secondary | ICD-10-CM | POA: Diagnosis not present

## 2017-05-27 DIAGNOSIS — Z79891 Long term (current) use of opiate analgesic: Secondary | ICD-10-CM | POA: Diagnosis not present

## 2017-05-27 DIAGNOSIS — M1990 Unspecified osteoarthritis, unspecified site: Secondary | ICD-10-CM | POA: Diagnosis not present

## 2017-05-27 DIAGNOSIS — S52592D Other fractures of lower end of left radius, subsequent encounter for closed fracture with routine healing: Secondary | ICD-10-CM | POA: Diagnosis not present

## 2017-05-30 ENCOUNTER — Telehealth (INDEPENDENT_AMBULATORY_CARE_PROVIDER_SITE_OTHER): Payer: Self-pay | Admitting: Orthopedic Surgery

## 2017-05-30 NOTE — Telephone Encounter (Signed)
Duplicate message this was done.

## 2017-05-30 NOTE — Telephone Encounter (Signed)
Isha called from [facility] wanting to confirm the patients weight bearing status? CB # [phone]

## 2017-05-30 NOTE — Telephone Encounter (Signed)
Called to advise that per last dictation minimize wtb and follow up in 1 month anticipate release to full wtb at that time and gave verbal ok for PT orders as requested.

## 2017-05-30 NOTE — Telephone Encounter (Signed)
[name] with [name] called needing verbal orders for HHPT 1 wk 1 and 3 wk 4. The number to contact Isha is [phone]

## 2017-05-31 DIAGNOSIS — G4089 Other seizures: Secondary | ICD-10-CM | POA: Diagnosis not present

## 2017-05-31 DIAGNOSIS — S72442D Displaced fracture of lower epiphysis (separation) of left femur, subsequent encounter for closed fracture with routine healing: Secondary | ICD-10-CM | POA: Diagnosis not present

## 2017-05-31 DIAGNOSIS — Z79891 Long term (current) use of opiate analgesic: Secondary | ICD-10-CM | POA: Diagnosis not present

## 2017-05-31 DIAGNOSIS — E119 Type 2 diabetes mellitus without complications: Secondary | ICD-10-CM | POA: Diagnosis not present

## 2017-05-31 DIAGNOSIS — M1990 Unspecified osteoarthritis, unspecified site: Secondary | ICD-10-CM | POA: Diagnosis not present

## 2017-05-31 DIAGNOSIS — Z7901 Long term (current) use of anticoagulants: Secondary | ICD-10-CM | POA: Diagnosis not present

## 2017-05-31 DIAGNOSIS — I10 Essential (primary) hypertension: Secondary | ICD-10-CM | POA: Diagnosis not present

## 2017-05-31 DIAGNOSIS — F329 Major depressive disorder, single episode, unspecified: Secondary | ICD-10-CM | POA: Diagnosis not present

## 2017-05-31 DIAGNOSIS — F17210 Nicotine dependence, cigarettes, uncomplicated: Secondary | ICD-10-CM | POA: Diagnosis not present

## 2017-05-31 DIAGNOSIS — Z7984 Long term (current) use of oral hypoglycemic drugs: Secondary | ICD-10-CM | POA: Diagnosis not present

## 2017-05-31 DIAGNOSIS — S52592D Other fractures of lower end of left radius, subsequent encounter for closed fracture with routine healing: Secondary | ICD-10-CM | POA: Diagnosis not present

## 2017-05-31 DIAGNOSIS — Z9181 History of falling: Secondary | ICD-10-CM | POA: Diagnosis not present

## 2017-06-01 ENCOUNTER — Telehealth (INDEPENDENT_AMBULATORY_CARE_PROVIDER_SITE_OTHER): Payer: Self-pay | Admitting: Orthopedic Surgery

## 2017-06-01 DIAGNOSIS — F329 Major depressive disorder, single episode, unspecified: Secondary | ICD-10-CM | POA: Diagnosis not present

## 2017-06-01 DIAGNOSIS — E119 Type 2 diabetes mellitus without complications: Secondary | ICD-10-CM | POA: Diagnosis not present

## 2017-06-01 DIAGNOSIS — Z7984 Long term (current) use of oral hypoglycemic drugs: Secondary | ICD-10-CM | POA: Diagnosis not present

## 2017-06-01 DIAGNOSIS — G4089 Other seizures: Secondary | ICD-10-CM | POA: Diagnosis not present

## 2017-06-01 DIAGNOSIS — I10 Essential (primary) hypertension: Secondary | ICD-10-CM | POA: Diagnosis not present

## 2017-06-01 DIAGNOSIS — S72442D Displaced fracture of lower epiphysis (separation) of left femur, subsequent encounter for closed fracture with routine healing: Secondary | ICD-10-CM | POA: Diagnosis not present

## 2017-06-01 DIAGNOSIS — S52592D Other fractures of lower end of left radius, subsequent encounter for closed fracture with routine healing: Secondary | ICD-10-CM | POA: Diagnosis not present

## 2017-06-01 DIAGNOSIS — F17210 Nicotine dependence, cigarettes, uncomplicated: Secondary | ICD-10-CM | POA: Diagnosis not present

## 2017-06-01 DIAGNOSIS — Z9181 History of falling: Secondary | ICD-10-CM | POA: Diagnosis not present

## 2017-06-01 DIAGNOSIS — Z79891 Long term (current) use of opiate analgesic: Secondary | ICD-10-CM | POA: Diagnosis not present

## 2017-06-01 DIAGNOSIS — Z7901 Long term (current) use of anticoagulants: Secondary | ICD-10-CM | POA: Diagnosis not present

## 2017-06-01 DIAGNOSIS — M1990 Unspecified osteoarthritis, unspecified site: Secondary | ICD-10-CM | POA: Diagnosis not present

## 2017-06-01 NOTE — Telephone Encounter (Signed)
Pt is s/p an ORIF distal femur fx. Called to give veral ok for orders as requested below. Lm on vm to call with questions.

## 2017-06-01 NOTE — Telephone Encounter (Signed)
Tamika from [facility] called asking for verbal approval on 2 week 4 and 2 PRN visits for medication and disease management and also said they would keep an eye on the wound. CB # [phone]

## 2017-06-03 DIAGNOSIS — S72442D Displaced fracture of lower epiphysis (separation) of left femur, subsequent encounter for closed fracture with routine healing: Secondary | ICD-10-CM | POA: Diagnosis not present

## 2017-06-03 DIAGNOSIS — S52592D Other fractures of lower end of left radius, subsequent encounter for closed fracture with routine healing: Secondary | ICD-10-CM | POA: Diagnosis not present

## 2017-06-03 DIAGNOSIS — Z9181 History of falling: Secondary | ICD-10-CM | POA: Diagnosis not present

## 2017-06-03 DIAGNOSIS — E119 Type 2 diabetes mellitus without complications: Secondary | ICD-10-CM | POA: Diagnosis not present

## 2017-06-03 DIAGNOSIS — Z79891 Long term (current) use of opiate analgesic: Secondary | ICD-10-CM | POA: Diagnosis not present

## 2017-06-03 DIAGNOSIS — G4089 Other seizures: Secondary | ICD-10-CM | POA: Diagnosis not present

## 2017-06-03 DIAGNOSIS — F329 Major depressive disorder, single episode, unspecified: Secondary | ICD-10-CM | POA: Diagnosis not present

## 2017-06-03 DIAGNOSIS — Z7901 Long term (current) use of anticoagulants: Secondary | ICD-10-CM | POA: Diagnosis not present

## 2017-06-03 DIAGNOSIS — Z7984 Long term (current) use of oral hypoglycemic drugs: Secondary | ICD-10-CM | POA: Diagnosis not present

## 2017-06-03 DIAGNOSIS — F17210 Nicotine dependence, cigarettes, uncomplicated: Secondary | ICD-10-CM | POA: Diagnosis not present

## 2017-06-03 DIAGNOSIS — M1990 Unspecified osteoarthritis, unspecified site: Secondary | ICD-10-CM | POA: Diagnosis not present

## 2017-06-03 DIAGNOSIS — I10 Essential (primary) hypertension: Secondary | ICD-10-CM | POA: Diagnosis not present

## 2017-06-07 DIAGNOSIS — Z7901 Long term (current) use of anticoagulants: Secondary | ICD-10-CM | POA: Diagnosis not present

## 2017-06-07 DIAGNOSIS — G4089 Other seizures: Secondary | ICD-10-CM | POA: Diagnosis not present

## 2017-06-07 DIAGNOSIS — Z9181 History of falling: Secondary | ICD-10-CM | POA: Diagnosis not present

## 2017-06-07 DIAGNOSIS — Z79891 Long term (current) use of opiate analgesic: Secondary | ICD-10-CM | POA: Diagnosis not present

## 2017-06-07 DIAGNOSIS — F329 Major depressive disorder, single episode, unspecified: Secondary | ICD-10-CM | POA: Diagnosis not present

## 2017-06-07 DIAGNOSIS — S52592D Other fractures of lower end of left radius, subsequent encounter for closed fracture with routine healing: Secondary | ICD-10-CM | POA: Diagnosis not present

## 2017-06-07 DIAGNOSIS — S72442D Displaced fracture of lower epiphysis (separation) of left femur, subsequent encounter for closed fracture with routine healing: Secondary | ICD-10-CM | POA: Diagnosis not present

## 2017-06-07 DIAGNOSIS — F17210 Nicotine dependence, cigarettes, uncomplicated: Secondary | ICD-10-CM | POA: Diagnosis not present

## 2017-06-07 DIAGNOSIS — E119 Type 2 diabetes mellitus without complications: Secondary | ICD-10-CM | POA: Diagnosis not present

## 2017-06-07 DIAGNOSIS — I10 Essential (primary) hypertension: Secondary | ICD-10-CM | POA: Diagnosis not present

## 2017-06-07 DIAGNOSIS — M1990 Unspecified osteoarthritis, unspecified site: Secondary | ICD-10-CM | POA: Diagnosis not present

## 2017-06-07 DIAGNOSIS — Z7984 Long term (current) use of oral hypoglycemic drugs: Secondary | ICD-10-CM | POA: Diagnosis not present

## 2017-06-08 DIAGNOSIS — Z79891 Long term (current) use of opiate analgesic: Secondary | ICD-10-CM | POA: Diagnosis not present

## 2017-06-08 DIAGNOSIS — Z7901 Long term (current) use of anticoagulants: Secondary | ICD-10-CM | POA: Diagnosis not present

## 2017-06-08 DIAGNOSIS — M1990 Unspecified osteoarthritis, unspecified site: Secondary | ICD-10-CM | POA: Diagnosis not present

## 2017-06-08 DIAGNOSIS — G4089 Other seizures: Secondary | ICD-10-CM | POA: Diagnosis not present

## 2017-06-08 DIAGNOSIS — Z7984 Long term (current) use of oral hypoglycemic drugs: Secondary | ICD-10-CM | POA: Diagnosis not present

## 2017-06-08 DIAGNOSIS — S72442D Displaced fracture of lower epiphysis (separation) of left femur, subsequent encounter for closed fracture with routine healing: Secondary | ICD-10-CM | POA: Diagnosis not present

## 2017-06-08 DIAGNOSIS — F17210 Nicotine dependence, cigarettes, uncomplicated: Secondary | ICD-10-CM | POA: Diagnosis not present

## 2017-06-08 DIAGNOSIS — E119 Type 2 diabetes mellitus without complications: Secondary | ICD-10-CM | POA: Diagnosis not present

## 2017-06-08 DIAGNOSIS — Z9181 History of falling: Secondary | ICD-10-CM | POA: Diagnosis not present

## 2017-06-08 DIAGNOSIS — S52592D Other fractures of lower end of left radius, subsequent encounter for closed fracture with routine healing: Secondary | ICD-10-CM | POA: Diagnosis not present

## 2017-06-08 DIAGNOSIS — F329 Major depressive disorder, single episode, unspecified: Secondary | ICD-10-CM | POA: Diagnosis not present

## 2017-06-08 DIAGNOSIS — I10 Essential (primary) hypertension: Secondary | ICD-10-CM | POA: Diagnosis not present

## 2017-06-10 DIAGNOSIS — M1990 Unspecified osteoarthritis, unspecified site: Secondary | ICD-10-CM | POA: Diagnosis not present

## 2017-06-10 DIAGNOSIS — Z79891 Long term (current) use of opiate analgesic: Secondary | ICD-10-CM | POA: Diagnosis not present

## 2017-06-10 DIAGNOSIS — E119 Type 2 diabetes mellitus without complications: Secondary | ICD-10-CM | POA: Diagnosis not present

## 2017-06-10 DIAGNOSIS — Z7984 Long term (current) use of oral hypoglycemic drugs: Secondary | ICD-10-CM | POA: Diagnosis not present

## 2017-06-10 DIAGNOSIS — S72442D Displaced fracture of lower epiphysis (separation) of left femur, subsequent encounter for closed fracture with routine healing: Secondary | ICD-10-CM | POA: Diagnosis not present

## 2017-06-10 DIAGNOSIS — S52592D Other fractures of lower end of left radius, subsequent encounter for closed fracture with routine healing: Secondary | ICD-10-CM | POA: Diagnosis not present

## 2017-06-10 DIAGNOSIS — F17210 Nicotine dependence, cigarettes, uncomplicated: Secondary | ICD-10-CM | POA: Diagnosis not present

## 2017-06-10 DIAGNOSIS — Z9181 History of falling: Secondary | ICD-10-CM | POA: Diagnosis not present

## 2017-06-10 DIAGNOSIS — I10 Essential (primary) hypertension: Secondary | ICD-10-CM | POA: Diagnosis not present

## 2017-06-10 DIAGNOSIS — F329 Major depressive disorder, single episode, unspecified: Secondary | ICD-10-CM | POA: Diagnosis not present

## 2017-06-10 DIAGNOSIS — Z7901 Long term (current) use of anticoagulants: Secondary | ICD-10-CM | POA: Diagnosis not present

## 2017-06-10 DIAGNOSIS — G4089 Other seizures: Secondary | ICD-10-CM | POA: Diagnosis not present

## 2017-06-13 DIAGNOSIS — F329 Major depressive disorder, single episode, unspecified: Secondary | ICD-10-CM | POA: Diagnosis not present

## 2017-06-13 DIAGNOSIS — Z79891 Long term (current) use of opiate analgesic: Secondary | ICD-10-CM | POA: Diagnosis not present

## 2017-06-13 DIAGNOSIS — F17210 Nicotine dependence, cigarettes, uncomplicated: Secondary | ICD-10-CM | POA: Diagnosis not present

## 2017-06-13 DIAGNOSIS — Z7901 Long term (current) use of anticoagulants: Secondary | ICD-10-CM | POA: Diagnosis not present

## 2017-06-13 DIAGNOSIS — S72442D Displaced fracture of lower epiphysis (separation) of left femur, subsequent encounter for closed fracture with routine healing: Secondary | ICD-10-CM | POA: Diagnosis not present

## 2017-06-13 DIAGNOSIS — S52592D Other fractures of lower end of left radius, subsequent encounter for closed fracture with routine healing: Secondary | ICD-10-CM | POA: Diagnosis not present

## 2017-06-13 DIAGNOSIS — M1990 Unspecified osteoarthritis, unspecified site: Secondary | ICD-10-CM | POA: Diagnosis not present

## 2017-06-13 DIAGNOSIS — G4089 Other seizures: Secondary | ICD-10-CM | POA: Diagnosis not present

## 2017-06-13 DIAGNOSIS — Z7984 Long term (current) use of oral hypoglycemic drugs: Secondary | ICD-10-CM | POA: Diagnosis not present

## 2017-06-13 DIAGNOSIS — Z9181 History of falling: Secondary | ICD-10-CM | POA: Diagnosis not present

## 2017-06-13 DIAGNOSIS — I10 Essential (primary) hypertension: Secondary | ICD-10-CM | POA: Diagnosis not present

## 2017-06-13 DIAGNOSIS — E119 Type 2 diabetes mellitus without complications: Secondary | ICD-10-CM | POA: Diagnosis not present

## 2017-06-15 ENCOUNTER — Telehealth (INDEPENDENT_AMBULATORY_CARE_PROVIDER_SITE_OTHER): Payer: Self-pay | Admitting: Orthopedic Surgery

## 2017-06-15 DIAGNOSIS — M1990 Unspecified osteoarthritis, unspecified site: Secondary | ICD-10-CM | POA: Diagnosis not present

## 2017-06-15 DIAGNOSIS — G4089 Other seizures: Secondary | ICD-10-CM | POA: Diagnosis not present

## 2017-06-15 DIAGNOSIS — Z79891 Long term (current) use of opiate analgesic: Secondary | ICD-10-CM | POA: Diagnosis not present

## 2017-06-15 DIAGNOSIS — I10 Essential (primary) hypertension: Secondary | ICD-10-CM | POA: Diagnosis not present

## 2017-06-15 DIAGNOSIS — S72442D Displaced fracture of lower epiphysis (separation) of left femur, subsequent encounter for closed fracture with routine healing: Secondary | ICD-10-CM | POA: Diagnosis not present

## 2017-06-15 DIAGNOSIS — F17210 Nicotine dependence, cigarettes, uncomplicated: Secondary | ICD-10-CM | POA: Diagnosis not present

## 2017-06-15 DIAGNOSIS — E119 Type 2 diabetes mellitus without complications: Secondary | ICD-10-CM | POA: Diagnosis not present

## 2017-06-15 DIAGNOSIS — Z7901 Long term (current) use of anticoagulants: Secondary | ICD-10-CM | POA: Diagnosis not present

## 2017-06-15 DIAGNOSIS — Z7984 Long term (current) use of oral hypoglycemic drugs: Secondary | ICD-10-CM | POA: Diagnosis not present

## 2017-06-15 DIAGNOSIS — Z9181 History of falling: Secondary | ICD-10-CM | POA: Diagnosis not present

## 2017-06-15 DIAGNOSIS — S52592D Other fractures of lower end of left radius, subsequent encounter for closed fracture with routine healing: Secondary | ICD-10-CM | POA: Diagnosis not present

## 2017-06-15 DIAGNOSIS — F329 Major depressive disorder, single episode, unspecified: Secondary | ICD-10-CM | POA: Diagnosis not present

## 2017-06-15 NOTE — Telephone Encounter (Signed)
Nicolette -nurse with [facility] called asked if Dr Sharol Given will sign for home health nursing 1 month 1, 2 wk 4 and 1 PRN. The number to contact Nicolette is [phone]

## 2017-06-16 NOTE — Telephone Encounter (Signed)
I called and advised verbal ok for orders below.

## 2017-06-17 DIAGNOSIS — F329 Major depressive disorder, single episode, unspecified: Secondary | ICD-10-CM | POA: Diagnosis not present

## 2017-06-17 DIAGNOSIS — M1990 Unspecified osteoarthritis, unspecified site: Secondary | ICD-10-CM | POA: Diagnosis not present

## 2017-06-17 DIAGNOSIS — Z7984 Long term (current) use of oral hypoglycemic drugs: Secondary | ICD-10-CM | POA: Diagnosis not present

## 2017-06-17 DIAGNOSIS — E119 Type 2 diabetes mellitus without complications: Secondary | ICD-10-CM | POA: Diagnosis not present

## 2017-06-17 DIAGNOSIS — S72442D Displaced fracture of lower epiphysis (separation) of left femur, subsequent encounter for closed fracture with routine healing: Secondary | ICD-10-CM | POA: Diagnosis not present

## 2017-06-17 DIAGNOSIS — Z79891 Long term (current) use of opiate analgesic: Secondary | ICD-10-CM | POA: Diagnosis not present

## 2017-06-17 DIAGNOSIS — Z9181 History of falling: Secondary | ICD-10-CM | POA: Diagnosis not present

## 2017-06-17 DIAGNOSIS — I10 Essential (primary) hypertension: Secondary | ICD-10-CM | POA: Diagnosis not present

## 2017-06-17 DIAGNOSIS — Z7901 Long term (current) use of anticoagulants: Secondary | ICD-10-CM | POA: Diagnosis not present

## 2017-06-17 DIAGNOSIS — G4089 Other seizures: Secondary | ICD-10-CM | POA: Diagnosis not present

## 2017-06-17 DIAGNOSIS — F17210 Nicotine dependence, cigarettes, uncomplicated: Secondary | ICD-10-CM | POA: Diagnosis not present

## 2017-06-17 DIAGNOSIS — S52592D Other fractures of lower end of left radius, subsequent encounter for closed fracture with routine healing: Secondary | ICD-10-CM | POA: Diagnosis not present

## 2017-06-20 ENCOUNTER — Telehealth (INDEPENDENT_AMBULATORY_CARE_PROVIDER_SITE_OTHER): Payer: Self-pay | Admitting: Orthopedic Surgery

## 2017-06-20 ENCOUNTER — Ambulatory Visit (INDEPENDENT_AMBULATORY_CARE_PROVIDER_SITE_OTHER): Payer: BLUE CROSS/BLUE SHIELD | Admitting: Orthopedic Surgery

## 2017-06-20 ENCOUNTER — Ambulatory Visit (INDEPENDENT_AMBULATORY_CARE_PROVIDER_SITE_OTHER): Payer: BLUE CROSS/BLUE SHIELD

## 2017-06-20 ENCOUNTER — Other Ambulatory Visit (INDEPENDENT_AMBULATORY_CARE_PROVIDER_SITE_OTHER): Payer: Self-pay

## 2017-06-20 ENCOUNTER — Encounter (INDEPENDENT_AMBULATORY_CARE_PROVIDER_SITE_OTHER): Payer: Self-pay | Admitting: Orthopedic Surgery

## 2017-06-20 VITALS — Ht 63.0 in | Wt 189.0 lb

## 2017-06-20 DIAGNOSIS — S72352A Displaced comminuted fracture of shaft of left femur, initial encounter for closed fracture: Secondary | ICD-10-CM

## 2017-06-20 DIAGNOSIS — M1990 Unspecified osteoarthritis, unspecified site: Secondary | ICD-10-CM | POA: Diagnosis not present

## 2017-06-20 DIAGNOSIS — Z7984 Long term (current) use of oral hypoglycemic drugs: Secondary | ICD-10-CM | POA: Diagnosis not present

## 2017-06-20 DIAGNOSIS — Z9181 History of falling: Secondary | ICD-10-CM | POA: Diagnosis not present

## 2017-06-20 DIAGNOSIS — I10 Essential (primary) hypertension: Secondary | ICD-10-CM | POA: Diagnosis not present

## 2017-06-20 DIAGNOSIS — Z7901 Long term (current) use of anticoagulants: Secondary | ICD-10-CM | POA: Diagnosis not present

## 2017-06-20 DIAGNOSIS — Z79891 Long term (current) use of opiate analgesic: Secondary | ICD-10-CM | POA: Diagnosis not present

## 2017-06-20 DIAGNOSIS — F329 Major depressive disorder, single episode, unspecified: Secondary | ICD-10-CM | POA: Diagnosis not present

## 2017-06-20 DIAGNOSIS — S52592D Other fractures of lower end of left radius, subsequent encounter for closed fracture with routine healing: Secondary | ICD-10-CM | POA: Diagnosis not present

## 2017-06-20 DIAGNOSIS — E119 Type 2 diabetes mellitus without complications: Secondary | ICD-10-CM | POA: Diagnosis not present

## 2017-06-20 DIAGNOSIS — S72442D Displaced fracture of lower epiphysis (separation) of left femur, subsequent encounter for closed fracture with routine healing: Secondary | ICD-10-CM | POA: Diagnosis not present

## 2017-06-20 DIAGNOSIS — G4089 Other seizures: Secondary | ICD-10-CM | POA: Diagnosis not present

## 2017-06-20 DIAGNOSIS — F17210 Nicotine dependence, cigarettes, uncomplicated: Secondary | ICD-10-CM | POA: Diagnosis not present

## 2017-06-20 NOTE — Telephone Encounter (Signed)
Patient said she was given a note today but her boss would need more specific restrictions since the note just said light duty. She doesn't know a fax number to her job and she doesn't have an email. She did give me her boss' contact info: Achille Rich phone # [phone].  Patients # [phone]

## 2017-06-20 NOTE — Progress Notes (Signed)
Office Visit Note   Patient: Christine Rocha           Date of Birth: 1955/03/22           MRN: [id number] Visit Date: 06/20/2017              Requested by: Tresa Garter, MD [address] [place],  [address] PCP: Tresa Garter, MD  Chief Complaint  Patient presents with  . Left Leg - Routine Post Op      HPI: Patient is 2 months status post open reduction internal fixation left femur fracture and supracondylar femur fracture.  Patient is ambulating with a walker she denies any pain with ambulation.  Assessment & Plan: Visit Diagnoses:  1. Closed displaced comminuted fracture of shaft of left femur, initial encounter ([place])     Plan: Patient states she would like to go back to work discussed the risk of hardware failure if she has any pain she is to get off her leg immediately.  She is given a note that she may return to work to [employer] duty work for 4 weeks.  Follow-up in 4 weeks with repeat 2 view radiographs of the left femur.  Follow-Up Instructions: Return in about 1 month (around 07/18/2017).   Ortho Exam  Patient is alert, oriented, no adenopathy, well-dressed, normal affect, normal respiratory effort. Examination patient is able ambulate with a rolling walker with full weightbearing she is asymptomatic.  The incision is well-healed.  Imaging: Xr Femur Min 2 Views Left  Result Date: 06/20/2017 2 view radiographs of the left femur shows callus formation at the fracture sites however there has been a little bit of displacement of the hardware.    No images are attached to the encounter.  Labs: Lab Results  Component Value Date   HGBA1C 6.8 05/26/2016   HGBA1C 6.3 09/13/2012     Lab Results  Component Value Date   ALBUMIN 3.2 (L) 04/24/2017   ALBUMIN 4.2 05/26/2016   ALBUMIN 4.4 02/25/2016    Body mass index is 33.48 kg/m.  Orders:  Orders Placed This Encounter  Procedures  . XR FEMUR MIN 2 VIEWS LEFT   No orders of the defined  types were placed in this encounter.    Procedures: No procedures performed  Clinical Data: No additional findings.  ROS:  All other systems negative, except as noted in the HPI. Review of Systems  Objective: Vital Signs: Ht 5\' 3"  (1.6 m)   Wt 189 lb (85.7 kg)   BMI 33.48 kg/m   Specialty Comments:  No specialty comments available.  PMFS History: Patient Active Problem List   Diagnosis Date Noted  . Femur fracture, left ([place]) 04/23/2017  . Preventative health care 10/28/2014  . Encounter for screening mammogram for breast cancer 10/28/2014  . Primary osteoarthritis of left knee 10/28/2014  . Essential hypertension 11/15/2013  . Prediabetes 11/15/2013  . Uncontrolled hypertension 10/11/2012  . Smoking addiction 10/11/2012   Past Medical History:  Diagnosis Date  . Hypertension   . Seizures ([place])     History reviewed. No pertinent family history.  Past Surgical History:  Procedure Laterality Date  . ORIF FEMUR FRACTURE Left 04/24/2017   Procedure: OPEN REDUCTION INTERNAL FIXATION (ORIF) DISTAL FEMUR FRACTURE;  Surgeon: [name], MD;  Location: [facility];  Service: Orthopedics;  Laterality: Left;   Social History   Occupational History  . Not on file  Tobacco Use  . Smoking status: Current Every Day Smoker  Packs/day: 0.25    Types: Cigarettes  . Smokeless tobacco: Never Used  Substance and Sexual Activity  . Alcohol use: No  . Drug use: No  . Sexual activity: Never    Birth control/protection: None

## 2017-06-20 NOTE — Telephone Encounter (Signed)
I called and lm on vm for pt to advise that I did not feel comfortable calling the pt's employer and talking about her medical issues. I could leave a note at the front desk that would advise that you could do light duty seated work that there could not be prolonged standing or walking or climbing. That there is a risk for hardware failure and that you needed to be permitted to do seated work or part time work if that was what she felt she could do.  If she wants a note I can leave one at the front desk just let me know what she would like me to do.

## 2017-06-20 NOTE — Telephone Encounter (Signed)
Patient called back, please call employer when you get a chance. Thank you

## 2017-06-21 DIAGNOSIS — Z9181 History of falling: Secondary | ICD-10-CM | POA: Diagnosis not present

## 2017-06-21 DIAGNOSIS — Z79891 Long term (current) use of opiate analgesic: Secondary | ICD-10-CM | POA: Diagnosis not present

## 2017-06-21 DIAGNOSIS — F329 Major depressive disorder, single episode, unspecified: Secondary | ICD-10-CM | POA: Diagnosis not present

## 2017-06-21 DIAGNOSIS — E119 Type 2 diabetes mellitus without complications: Secondary | ICD-10-CM | POA: Diagnosis not present

## 2017-06-21 DIAGNOSIS — Z7984 Long term (current) use of oral hypoglycemic drugs: Secondary | ICD-10-CM | POA: Diagnosis not present

## 2017-06-21 DIAGNOSIS — Z7901 Long term (current) use of anticoagulants: Secondary | ICD-10-CM | POA: Diagnosis not present

## 2017-06-21 DIAGNOSIS — F17210 Nicotine dependence, cigarettes, uncomplicated: Secondary | ICD-10-CM | POA: Diagnosis not present

## 2017-06-21 DIAGNOSIS — I10 Essential (primary) hypertension: Secondary | ICD-10-CM | POA: Diagnosis not present

## 2017-06-21 DIAGNOSIS — S52592D Other fractures of lower end of left radius, subsequent encounter for closed fracture with routine healing: Secondary | ICD-10-CM | POA: Diagnosis not present

## 2017-06-21 DIAGNOSIS — G4089 Other seizures: Secondary | ICD-10-CM | POA: Diagnosis not present

## 2017-06-21 DIAGNOSIS — M1990 Unspecified osteoarthritis, unspecified site: Secondary | ICD-10-CM | POA: Diagnosis not present

## 2017-06-21 DIAGNOSIS — S72442D Displaced fracture of lower epiphysis (separation) of left femur, subsequent encounter for closed fracture with routine healing: Secondary | ICD-10-CM | POA: Diagnosis not present

## 2017-06-24 DIAGNOSIS — F329 Major depressive disorder, single episode, unspecified: Secondary | ICD-10-CM | POA: Diagnosis not present

## 2017-06-24 DIAGNOSIS — Z7984 Long term (current) use of oral hypoglycemic drugs: Secondary | ICD-10-CM | POA: Diagnosis not present

## 2017-06-24 DIAGNOSIS — Z79891 Long term (current) use of opiate analgesic: Secondary | ICD-10-CM | POA: Diagnosis not present

## 2017-06-24 DIAGNOSIS — I10 Essential (primary) hypertension: Secondary | ICD-10-CM | POA: Diagnosis not present

## 2017-06-24 DIAGNOSIS — S52592D Other fractures of lower end of left radius, subsequent encounter for closed fracture with routine healing: Secondary | ICD-10-CM | POA: Diagnosis not present

## 2017-06-24 DIAGNOSIS — E119 Type 2 diabetes mellitus without complications: Secondary | ICD-10-CM | POA: Diagnosis not present

## 2017-06-24 DIAGNOSIS — Z7901 Long term (current) use of anticoagulants: Secondary | ICD-10-CM | POA: Diagnosis not present

## 2017-06-24 DIAGNOSIS — M1990 Unspecified osteoarthritis, unspecified site: Secondary | ICD-10-CM | POA: Diagnosis not present

## 2017-06-24 DIAGNOSIS — S72442D Displaced fracture of lower epiphysis (separation) of left femur, subsequent encounter for closed fracture with routine healing: Secondary | ICD-10-CM | POA: Diagnosis not present

## 2017-06-24 DIAGNOSIS — G4089 Other seizures: Secondary | ICD-10-CM | POA: Diagnosis not present

## 2017-06-24 DIAGNOSIS — Z9181 History of falling: Secondary | ICD-10-CM | POA: Diagnosis not present

## 2017-06-24 DIAGNOSIS — F17210 Nicotine dependence, cigarettes, uncomplicated: Secondary | ICD-10-CM | POA: Diagnosis not present

## 2017-07-18 ENCOUNTER — Encounter (INDEPENDENT_AMBULATORY_CARE_PROVIDER_SITE_OTHER): Payer: Self-pay | Admitting: Orthopedic Surgery

## 2017-07-18 ENCOUNTER — Ambulatory Visit (INDEPENDENT_AMBULATORY_CARE_PROVIDER_SITE_OTHER): Payer: BLUE CROSS/BLUE SHIELD

## 2017-07-18 ENCOUNTER — Ambulatory Visit (INDEPENDENT_AMBULATORY_CARE_PROVIDER_SITE_OTHER): Payer: BLUE CROSS/BLUE SHIELD | Admitting: Orthopedic Surgery

## 2017-07-18 VITALS — Ht 63.0 in | Wt 189.0 lb

## 2017-07-18 DIAGNOSIS — S72352A Displaced comminuted fracture of shaft of left femur, initial encounter for closed fracture: Secondary | ICD-10-CM

## 2017-07-18 NOTE — Progress Notes (Signed)
Office Visit Note   Patient: Christine Rocha           Date of Birth: February 25, 1955           MRN: [id number] Visit Date: 07/18/2017              Requested by: Tresa Garter, MD [address] [place], [place] [address] PCP: Tresa Garter, MD  Chief Complaint  Patient presents with  . Left Leg - Routine Post Op    04/24/17 ORIF distal femur fx      HPI: Patient is a 62 year old woman who presents 3 months status post open reduction internal fixation left femur.  She ambulance with a cane she has no complaints.  She wants to return to work.  Assessment & Plan: Visit Diagnoses:  1. Closed displaced comminuted fracture of shaft of left femur, initial encounter [facility])     Plan: Patient is given a note that she may return to work tomorrow without restrictions.  Follow-Up Instructions: Return if symptoms worsen or fail to improve.   Ortho Exam  Patient is alert, oriented, no adenopathy, well-dressed, normal affect, normal respiratory effort. Examination patient ambulates with a cane.  She has no pain with weightbearing.  Patient is pleased with her progress.  Imaging: Xr Femur Min 2 Views Left  Result Date: 07/18/2017 2 view radiographs of the left femur show stable internal fixation the bone has healed well no complicating features no hardware failure.  No images are attached to the encounter.  Labs: Lab Results  Component Value Date   HGBA1C 6.8 05/26/2016   HGBA1C 6.3 09/13/2012     Lab Results  Component Value Date   ALBUMIN 3.2 (L) 04/24/2017   ALBUMIN 4.2 05/26/2016   ALBUMIN 4.4 02/25/2016    Body mass index is 33.48 kg/m.  Orders:  Orders Placed This Encounter  Procedures  . XR FEMUR MIN 2 VIEWS LEFT   No orders of the defined types were placed in this encounter.    Procedures: No procedures performed  Clinical Data: No additional findings.  ROS:  All other systems negative, except as noted in the HPI. Review of  Systems  Objective: Vital Signs: Ht 5\' 3"  (1.6 m)   Wt 189 lb (85.7 kg)   BMI 33.48 kg/m   Specialty Comments:  No specialty comments available.  PMFS History: Patient Active Problem List   Diagnosis Date Noted  . Femur fracture, left ([place]) 04/23/2017  . Preventative health care 10/28/2014  . Encounter for screening mammogram for breast cancer 10/28/2014  . Primary osteoarthritis of left knee 10/28/2014  . Essential hypertension 11/15/2013  . Prediabetes 11/15/2013  . Uncontrolled hypertension 10/11/2012  . Smoking addiction 10/11/2012   Past Medical History:  Diagnosis Date  . Hypertension   . Seizures ([place])     History reviewed. No pertinent family history.  Past Surgical History:  Procedure Laterality Date  . ORIF FEMUR FRACTURE Left 04/24/2017   Procedure: OPEN REDUCTION INTERNAL FIXATION (ORIF) DISTAL FEMUR FRACTURE;  Surgeon: [name], MD;  Location: [facility];  Service: Orthopedics;  Laterality: Left;   Social History   Occupational History  . Not on file  Tobacco Use  . Smoking status: Current Every Day Smoker    Packs/day: 0.25    Types: Cigarettes  . Smokeless tobacco: Never Used  Substance and Sexual Activity  . Alcohol use: No  . Drug use: No  . Sexual activity: Never    Birth control/protection:  None

## 2017-08-17 ENCOUNTER — Telehealth (INDEPENDENT_AMBULATORY_CARE_PROVIDER_SITE_OTHER): Payer: Self-pay | Admitting: Orthopedic Surgery

## 2017-08-17 NOTE — Telephone Encounter (Signed)
I called and lm on vm to advise that per last office visit on 07/18/17 that she was able to return to work without restrictions and that she did not have any complications from her surgery in April and was healing well. Advised to call the office and make an appt to discuss with Dr. Sharol Given any changes to her condition. Unable to write note at this time.

## 2017-08-17 NOTE — Telephone Encounter (Signed)
Patient came into office today around 11:15am without appointment requesting to have Dr Sharol Given provide her with a letter that she can take with her when she applies for disability.  She presented a letter from [place] asking that it be shown to Dr. Sharol Given. 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    Thanks,  [name]     Please contact patient upon completion. Should you have questions regarding this request, please call patient at   [phone]

## 2017-08-18 ENCOUNTER — Telehealth (INDEPENDENT_AMBULATORY_CARE_PROVIDER_SITE_OTHER): Payer: Self-pay | Admitting: Orthopedic Surgery

## 2017-08-18 NOTE — Telephone Encounter (Signed)
Patient called left vm to make appt with Sharol Given. Called patient back no answer. Left message to call back to make appt

## 2017-08-22 ENCOUNTER — Ambulatory Visit (INDEPENDENT_AMBULATORY_CARE_PROVIDER_SITE_OTHER): Payer: Self-pay

## 2017-08-22 ENCOUNTER — Ambulatory Visit (INDEPENDENT_AMBULATORY_CARE_PROVIDER_SITE_OTHER): Payer: BLUE CROSS/BLUE SHIELD | Admitting: Orthopedic Surgery

## 2017-08-22 ENCOUNTER — Encounter (INDEPENDENT_AMBULATORY_CARE_PROVIDER_SITE_OTHER): Payer: Self-pay | Admitting: Orthopedic Surgery

## 2017-08-22 VITALS — Ht 63.0 in | Wt 189.0 lb

## 2017-08-22 DIAGNOSIS — S72352S Displaced comminuted fracture of shaft of left femur, sequela: Secondary | ICD-10-CM

## 2017-08-22 DIAGNOSIS — E440 Moderate protein-calorie malnutrition: Secondary | ICD-10-CM

## 2017-08-22 NOTE — Progress Notes (Signed)
Office Visit Note   Patient: Christine Rocha           Date of Birth: 1955/07/18           MRN: [id number] Visit Date: 08/22/2017              Requested by: Tresa Garter, MD [address] [place], [place] [address] PCP: Tresa Garter, MD  Chief Complaint  Patient presents with  . 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  Patient states she is been unable to perform her activities of daily living as well as perform her work due to pain in her left thigh.  Patient states she has to do labor with holding onto the cart and has to do more sitting and standing she currently uses a cane for ambulation.  Assessment & Plan: Visit Diagnoses:  1. Closed displaced comminuted fracture of shaft of left femur, sequela   2. Moderate protein-calorie malnutrition ([place])     Plan: Patient is totally disabled from her current level of work will follow-up as needed.  The femur fracture appears stable.  Follow-Up Instructions: No follow-ups on file.   Ortho Exam  Patient is alert, oriented, no adenopathy, well-dressed, normal affect, normal respiratory effort. Patient has an antalgic gait she uses a cane for ambulation.  Patient's radiographs shows increased callus formation with failure of the interlocking screws proximally.  She does have arthritis of her knee.  Imaging: No results found. No images are attached to the encounter.  Labs: Lab Results  Component Value Date   HGBA1C 6.8 05/26/2016   HGBA1C 6.3 09/13/2012     Lab Results  Component Value Date   ALBUMIN 3.2 (L) 04/24/2017   ALBUMIN 4.2 05/26/2016   ALBUMIN 4.4 02/25/2016    Body mass index is 33.48 kg/m.  Orders:  Orders Placed This Encounter  Procedures  . Large Joint Inj  . Large Joint Inj  . XR FEMUR MIN 2 VIEWS LEFT   No orders of the defined types were placed in this  encounter.    Procedures: No procedures performed  Clinical Data: No additional findings.  ROS:  All other systems negative, except as noted in the HPI. Review of Systems  Objective: Vital Signs: Ht 5\' 3"  (1.6 m)   Wt 189 lb (85.7 kg)   BMI 33.48 kg/m   Specialty Comments:  No specialty comments available.  PMFS History: Patient Active Problem List   Diagnosis Date Noted  . Femur fracture, left ([place]) 04/23/2017  . Preventative health care 10/28/2014  . Encounter for screening mammogram for breast cancer 10/28/2014  . Primary osteoarthritis of left knee 10/28/2014  . Essential hypertension 11/15/2013  . Prediabetes 11/15/2013  . Uncontrolled hypertension 10/11/2012  . Smoking addiction 10/11/2012   Past Medical History:  Diagnosis Date  . Hypertension   . Seizures ([place])     History reviewed. No pertinent family history.  Past Surgical History:  Procedure Laterality Date  . ORIF FEMUR FRACTURE Left 04/24/2017   Procedure: OPEN REDUCTION INTERNAL FIXATION (ORIF) DISTAL FEMUR FRACTURE;  Surgeon: [name], MD;  Location: [facility];  Service: Orthopedics;  Laterality: Left;   Social History   Occupational History  . Not on file  Tobacco Use  . Smoking status: Current Every Day Smoker    Packs/day:  0.25    Types: Cigarettes  . Smokeless tobacco: Never Used  Substance and Sexual Activity  . Alcohol use: No  . Drug use: No  . Sexual activity: Never    Birth control/protection: None

## 2017-10-07 ENCOUNTER — Other Ambulatory Visit: Payer: Self-pay | Admitting: Internal Medicine

## 2017-10-07 DIAGNOSIS — Z1231 Encounter for screening mammogram for malignant neoplasm of breast: Secondary | ICD-10-CM

## 2017-11-10 ENCOUNTER — Ambulatory Visit
Admission: RE | Admit: 2017-11-10 | Discharge: 2017-11-10 | Disposition: A | Discharge status: Home / Self Care | Payer: BLUE CROSS/BLUE SHIELD | Source: Ambulatory Visit | Attending: Internal Medicine | Admitting: Internal Medicine

## 2017-11-10 DIAGNOSIS — Z1231 Encounter for screening mammogram for malignant neoplasm of breast: Secondary | ICD-10-CM | POA: Diagnosis not present

## 2017-11-14 ENCOUNTER — Telehealth: Payer: Self-pay | Admitting: *Deleted

## 2017-11-14 NOTE — Telephone Encounter (Signed)
-----   Message from Tresa Garter, MD sent at 11/10/2017 11:00 AM EDT ----- Please inform patient that her screening mammogram shows no evidence of malignancy. Recommend screening mammogram in one year

## 2017-11-14 NOTE — Telephone Encounter (Signed)
Patient verified DOB Patient is aware of no evidence of malignancy being noted and a routine follow up to be completed in one year. No further questions.

## 2017-12-22 ENCOUNTER — Telehealth (INDEPENDENT_AMBULATORY_CARE_PROVIDER_SITE_OTHER): Payer: Self-pay | Admitting: Orthopedic Surgery

## 2017-12-22 NOTE — Telephone Encounter (Signed)
Shala from [facility] called to fu on VO that were faxed over on October 30th but have not received anything back.  CB#[phone]

## 2017-12-23 NOTE — Telephone Encounter (Signed)
I called and se Shala to advise that I do not see the orders on the chart and if she wants to refax for signature Dr. Sharol Given can sign and send back.

## 2017-12-26 NOTE — Telephone Encounter (Signed)
(  [phone]  Ext [phone]  Shayla called back left VM called her back to verity patient

## 2017-12-27 NOTE — Telephone Encounter (Signed)
Done, will be faxed today per Tammy.

## 2018-02-16 ENCOUNTER — Encounter: Payer: Self-pay | Admitting: Family Medicine

## 2018-02-16 ENCOUNTER — Ambulatory Visit: Payer: BLUE CROSS/BLUE SHIELD | Attending: Family Medicine | Admitting: Family Medicine

## 2018-02-16 VITALS — BP 151/95 | HR 68 | Temp 98.3°F | Resp 18 | Ht 63.0 in | Wt 196.0 lb

## 2018-02-16 DIAGNOSIS — R1024 Suprapubic pain: Secondary | ICD-10-CM

## 2018-02-16 DIAGNOSIS — I10 Essential (primary) hypertension: Secondary | ICD-10-CM | POA: Diagnosis not present

## 2018-02-16 DIAGNOSIS — E1165 Type 2 diabetes mellitus with hyperglycemia: Secondary | ICD-10-CM

## 2018-02-16 DIAGNOSIS — R8290 Unspecified abnormal findings in urine: Secondary | ICD-10-CM | POA: Diagnosis not present

## 2018-02-16 DIAGNOSIS — I160 Hypertensive urgency: Secondary | ICD-10-CM

## 2018-02-16 DIAGNOSIS — R221 Localized swelling, mass and lump, neck: Secondary | ICD-10-CM

## 2018-02-16 DIAGNOSIS — Z9114 Patient's other noncompliance with medication regimen: Secondary | ICD-10-CM

## 2018-02-16 DIAGNOSIS — N3001 Acute cystitis with hematuria: Secondary | ICD-10-CM | POA: Diagnosis not present

## 2018-02-16 DIAGNOSIS — Z91148 Patient's other noncompliance with medication regimen for other reason: Secondary | ICD-10-CM

## 2018-02-16 DIAGNOSIS — Z79899 Other long term (current) drug therapy: Secondary | ICD-10-CM

## 2018-02-16 DIAGNOSIS — R102 Pelvic and perineal pain: Secondary | ICD-10-CM

## 2018-02-16 LAB — POCT URINALYSIS DIP (CLINITEK)
Bilirubin, UA: NEGATIVE
Glucose, UA: 500 mg/dL — AB
Nitrite, UA: NEGATIVE
POC PROTEIN,UA: NEGATIVE
Spec Grav, UA: <=1.005 — AB
Urobilinogen, UA: 0.2 U/dL
pH, UA: 5.5

## 2018-02-16 LAB — POCT GLYCOSYLATED HEMOGLOBIN (HGB A1C): Hemoglobin A1C: 13.2 % — AB (ref 4.0–5.6)

## 2018-02-16 LAB — GLUCOSE, POCT (MANUAL RESULT ENTRY)
POC Glucose: 333 mg/dL — AB (ref 70–99)
POC Glucose: 314 mg/dL — AB (ref 70–99)

## 2018-02-16 MED ORDER — CLONIDINE HCL 0.2 MG PO TABS
0.2000 mg | ORAL_TABLET | Freq: Every day | ORAL | Status: AC
  Administered 2018-02-16: 0.2 mg via ORAL

## 2018-02-16 MED ORDER — INSULIN ASPART 100 UNIT/ML ~~LOC~~ SOLN
10.0000 [IU] | Freq: Once | SUBCUTANEOUS | Status: AC
  Administered 2018-02-16: 10 [IU] via SUBCUTANEOUS

## 2018-02-16 MED ORDER — METFORMIN HCL 1000 MG PO TABS: 500.0000 mg | ORAL_TABLET | Freq: Two times a day (BID) | ORAL | 0 refills | Status: DC

## 2018-02-16 MED ORDER — AMLODIPINE BESYLATE 10 MG PO TABS: 10.0000 mg | ORAL_TABLET | Freq: Every day | ORAL | 0 refills | Status: DC

## 2018-02-16 MED ORDER — SULFAMETHOXAZOLE-TRIMETHOPRIM 800-160 MG PO TABS: 1.0000 | ORAL_TABLET | Freq: Two times a day (BID) | ORAL | 5 refills | Status: DC

## 2018-02-16 MED ORDER — ATENOLOL 100 MG PO TABS: 100.0000 mg | ORAL_TABLET | Freq: Every day | ORAL | 0 refills | Status: DC

## 2018-02-16 NOTE — Progress Notes (Signed)
Subjective:    Patient ID: Christine Rocha, female    DOB: [birth year], 63 y.o.   MRN: [id number]  HPI       63 yo female last seen in the office on 05/26/16 who returns to re-establish care.  Patient reports that she has been out of her blood pressure medication for the past 2 months.  Patient reports that 2 weeks ago she had a nosebleed and then coughed up a little blood.  Patient has also had some dizziness.  Patient also had ringing in the ears during her episode of nosebleed and is now having recurrent episodes of ringing in the ears off and on.  When she was taking her prior blood pressure medications, she felt that her blood pressure was controlled.       Patient reports that she slipped on a wet spot on her tiled floor in her kitchen causing her to fall and bend her left leg backwards which caused her to have a fracture in 3 areas which led her to having surgery.  Patient states that her surgeon has now written her out of work.  Patient continues to have pain in her left leg especially with changes in weather and ambulation.  Pain is dull and aching and is generally around a 5-6 with activity.  Patient is using a cane for ambulation.       Patient has also noticed increased yellow color to her urine.  Patient denies urinary frequency or dysuria.  Patient reports no increased thirst.  When asked about being diabetic, patient states that she thinks someone mentioned this to her last year but she has not been on medication for diabetes.  Patient does not have a home glucometer.  Patient believes she may have been prescribed medicine for diabetes at her hospital discharge but she does not believe that she ever had this medication filled.   Past Medical History:  Diagnosis Date  . Hypertension   . Seizures ([place])    Social history: Patient smokes cigarettes, currently out of work status post left femur fracture and none none  Past Surgical History:  Procedure Laterality Date  . ORIF FEMUR FRACTURE  Left 04/24/2017   Procedure: OPEN REDUCTION INTERNAL FIXATION (ORIF) DISTAL FEMUR FRACTURE;  Surgeon: [name], MD;  Location: [place];  Service: Orthopedics;  Laterality: Left;   Patient reports family history of sister with cancer of unknown type and patient also with a sister with diabetes and hypertension.  Patient reports that her mother is now deceased but did have hypertension.  Patient denies any family history of heart disease or stroke  Allergies  Allergen Reactions  . Penicillins      Review of Systems  Constitutional: Positive for fatigue. Negative for chills and fever.  HENT: Positive for nosebleeds and tinnitus. Negative for congestion, ear pain, postnasal drip, rhinorrhea, sore throat and trouble swallowing.   Respiratory: Negative for cough and shortness of breath.   Cardiovascular: Positive for leg swelling (mild on left since fracture). Negative for chest pain and palpitations.  Gastrointestinal: Positive for abdominal pain (mid-lower abdomen). Negative for blood in stool, constipation, diarrhea and nausea.  Genitourinary: Negative for difficulty urinating, dysuria and frequency.  Musculoskeletal: Positive for arthralgias and gait problem.  Neurological: Positive for dizziness. Negative for headaches.  Hematological: Negative for adenopathy. Does not bruise/bleed easily.       Objective:   Physical Exam BP (!) 194/108 (BP Location: Left Arm, Patient Position: Sitting, Cuff Size: Normal)  Pulse 89   Temp 98.3 F (36.8 C) (Oral)   Resp 18   Ht 5\' 3"  (1.6 m)   Wt 196 lb (88.9 kg)   SpO2 100%   BMI 34.72 kg/m  Nurse's notes and vital signs reviewed General-well-nourished, well-developed older female in no acute distress ENT- TMs gray, nares with mild edema of the nasal turbinates, normal oropharynx Neck-supple, no lymphadenopathy, patient with palpable area on the right mid neck in the area of the upper lobe of the thyroid which may represent a nodule.  No  carotid bruit appreciated on exam Lungs-clear to auscultation bilaterally Cardiovascular-regular rate and regular rhythm Abdomen- normal bowel sounds, soft, mild suprapubic discomfort to palpation, no rebound or guarding Back- no CVA tenderness Extremities- mild nonpitting distal lower extremity on the left Skin- no active skin breakdown on the feet.      Assessment & Plan:  1.  Uncontrolled type 2 diabetes with hyperglycemia without long-term current use of insulin CMA checked patient's hemoglobin A1c under the assumption that patient was prediabetic however patient with hemoglobin A1c of 6.8 on 05/26/2016 and on review of hospital records, patient was discharged on metformin 500 mg twice daily for treatment of diabetes back in April 2019.  Patient reports that she has not been on any medication for the treatment of diabetes and she denies any current symptoms related to diabetes other than some blurred vision.  Patient had random glucose at today's visit which was elevated at 333.  Patient with urinalysis which showed glucosuria as well as small ketones and patient was given regular insulin 10 units x 1 here in the office.  Prescription provided for patient to take metformin at 500 milligrams twice daily.  Glucometer and diabetic testing supplies will be sent to patient's pharmacy based on patient's insurance preference.  Patient has been asked to return to clinic next week and bring her glucometer so that she can receive instructions if she is not sure how to check her blood sugars.  Prescription will also be provided for low-dose glipizide 5 mg in the morning with the first meal of the day to help lower her blood sugars.  Discussed importance of a low carbohydrate diet and remaining well-hydrated with water.  Patient will have CMP, TSH, repeat glucose at today's visit.  Diabetic eye exam encouraged as well as future lipid panel and urine microalbumin.  Patient is nonfasting at today's visit. - HgB  A1c - POCT URINALYSIS DIP (CLINITEK) - Glucose (CBG)  2. Hypertensive urgency Patient with hypertensive urgency as she has been out of her blood pressure medications for the past 2 months.  Patient reports that her blood sugars were controlled on her prior medications and patient is provided with refills of atenolol and amlodipine.  Due to her elevated blood pressure patient was given clonidine 0.2 mg here in the office and patient's blood pressure was rechecked.  Patient will have CMP in follow-up of hypertension and medication use.  Signs and symptoms of stroke discussed with the patient and she should seek immediate medical attention should these occur. - cloNIDine (CATAPRES) tablet 0.2 mg - Comprehensive metabolic panel - CBC with Differential  3. Suprapubic discomfort Patient with complaint of suprapubic discomfort at today's visit which was also present on exam.  Patient denies urinary frequency or dysuria.  Patient will have urinalysis as well as CBC. - CBC with Differential  4. Abnormal urinalysis Patient with abnormal findings on urinalysis consistent with cystitis with hematuria.  Patient's urine will be  sent for culture.  In the interim, patient will be placed on Septra DS as urinary tract infection could be contributing to patient's elevated blood sugars. - Urine Culture  5. Essential hypertension Patient provided with refill of her atenolol and amlodipine which patient reports has controlled her blood pressure in the past however she states that she has been out of the medication for the past 2 months.  Medication compliance discussed with the patient at today's visit.  Patient will have CMP in follow-up of hypertension and medication use.   - atenolol (TENORMIN) 100 MG tablet; Take 1 tablet (100 mg total) by mouth daily. To lower blood pressure  Dispense: 90 tablet; Refill: 0 - amLODipine (NORVASC) 10 MG tablet; Take 1 tablet (10 mg total) by mouth daily. To lower blood pressure   Dispense: 90 tablet; Refill: 0 - Comprehensive metabolic panel  6. Acute cystitis with hematuria Patient with acute cystitis with hematuria based on urinalysis results.  Patient is being placed on Septra DS twice daily x5 days.  Urine will be sent for culture and patient will be notified of a change in antibiotic therapy is needed based on the culture results. - sulfamethoxazole-trimethoprim (BACTRIM DS,SEPTRA DS) 800-160 MG tablet; Take 1 tablet by mouth 2 (two) times daily. To treat bladder infection  Dispense: 10 tablet; Refill: 0   7. Uncontrolled type 2 diabetes mellitus with hyperglycemia ([place]) See 1.  Patient with uncontrolled type 2 diabetes with hyperglycemia with hemoglobin A1c at today's visit of 13.2 and glucose of 333.  Patient received NovoLog 10 units x 1 here in the office as she had elevated glucose as well as small ketones on urinalysis.  Patient will have repeat glucose here in the office as per protocol.  Prescription provided for metformin as per hospital discharge in April and patient will also be sent in prescriptions for glucometer as per her insurance company preferences and glipizide XL 5 mg before the first meal of the day to help lower her blood sugars.  Patient has been asked to return to clinic in 1 week for reevaluation of both the blood pressure and blood sugar but she did go to the emergency department if she has any acute issues in the interim. - metFORMIN (GLUCOPHAGE) 1000 MG tablet; Take 0.5 tablets (500 mg total) by mouth 2 (two) times daily with a meal. To help control blood sugars  Dispense: 180 tablet; Refill: 0 - Glucose (CBG) - Comprehensive metabolic panel - insulin aspart (novoLOG) injection 10 Units - TSH + free T4 - Glucose (CBG)  8. Nodule of neck Patient reports feeling a nodule in the right mid neck and on exam, this appears to be within the upper lobe of the right thyroid.  Patient will be scheduled for thyroid ultrasound and will check TSH and T4  today's visit. - US THYROID; Future - TSH + free T4  9. Encounter for long-term (current) use of medications Patient will have CMP at today's visit in follow-up of long-term use of medications. - Comprehensive metabolic panel  10.  Noncompliance with medications Patient reports that she has been out of her blood pressure medications for the past 2 months and she does not believe that she ever started medication for her diabetes status post hospital discharge in April 2019.  The importance of compliance with medication to help control her chronic issues was discussed with the patient at today's visit.   An After Visit Summary was printed and given to the patient.  Allergies as  of 02/16/2018      Reactions   Penicillins       Medication List       Accurate as of February 16, 2018 11:59 PM. Always use your most recent med list.        acetaminophen 325 MG tablet Commonly known as:  TYLENOL Take 1-2 tablets (325-650 mg total) by mouth every 6 (six) hours as needed for mild pain (pain score 1-3 or temp > 100.5).   amLODipine 10 MG tablet Commonly known as:  NORVASC Take 1 tablet (10 mg total) by mouth daily. To lower blood pressure   atenolol 100 MG tablet Commonly known as:  TENORMIN Take 1 tablet (100 mg total) by mouth daily. To lower blood pressure   buPROPion 150 MG 12 hr tablet Commonly known as:  ZYBAN Take 1 tablet (150 mg total) by mouth 2 (two) times daily.   carboxymethylcellulose 0.5 % Soln Commonly known as:  REFRESH PLUS Place 1 drop into both eyes daily as needed (dry eyes).   magnesium citrate Soln Take 296 mLs (1 Bottle total) by mouth once as needed for severe constipation.   metFORMIN 1000 MG tablet Commonly known as:  GLUCOPHAGE Take 0.5 tablets (500 mg total) by mouth 2 (two) times daily with a meal. To help control blood sugars   polyethylene glycol packet Commonly known as:  MIRALAX / GLYCOLAX Take 17 g by mouth daily.   senna-docusate 8.6-50 MG  tablet Commonly known as:  Senokot-S Take 1 tablet by mouth 2 (two) times daily.   sulfamethoxazole-trimethoprim 800-160 MG tablet Commonly known as:  BACTRIM DS,SEPTRA DS Take 1 tablet by mouth 2 (two) times daily. To treat bladder infection   WOMENS 50+ MULTI VITAMIN/MIN Tabs Take 1 tablet by mouth daily.       Return in about 1 week (around 02/23/2018) for HTN/DM.

## 2018-02-17 LAB — CBC WITH DIFFERENTIAL/PLATELET
Basophils Absolute: 0.1 x10E3/uL (ref 0.0–0.2)
Basos: 1 %
EOS (ABSOLUTE): 0.1 x10E3/uL (ref 0.0–0.4)
Eos: 2 %
Hematocrit: 45.4 % (ref 34.0–46.6)
Hemoglobin: 14.5 g/dL (ref 11.1–15.9)
Immature Grans (Abs): 0 x10E3/uL (ref 0.0–0.1)
Immature Granulocytes: 0 %
Lymphocytes Absolute: 2.3 x10E3/uL (ref 0.7–3.1)
Lymphs: 49 %
MCH: 26.9 pg (ref 26.6–33.0)
MCHC: 31.9 g/dL (ref 31.5–35.7)
MCV: 84 fL (ref 79–97)
Monocytes Absolute: 0.3 x10E3/uL (ref 0.1–0.9)
Monocytes: 7 %
Neutrophils Absolute: 1.9 x10E3/uL (ref 1.4–7.0)
Neutrophils: 41 %
Platelets: 302 x10E3/uL (ref 150–450)
RBC: 5.39 x10E6/uL — ABNORMAL HIGH (ref 3.77–5.28)
RDW: 13 % (ref 11.7–15.4)
WBC: 4.6 x10E3/uL (ref 3.4–10.8)

## 2018-02-17 LAB — COMPREHENSIVE METABOLIC PANEL WITH GFR
ALT: 42 IU/L — ABNORMAL HIGH (ref 0–32)
AST: 26 IU/L (ref 0–40)
Albumin/Globulin Ratio: 1.6 (ref 1.2–2.2)
Albumin: 4.5 g/dL (ref 3.8–4.9)
Alkaline Phosphatase: 183 IU/L — ABNORMAL HIGH (ref 39–117)
BUN/Creatinine Ratio: 6 — ABNORMAL LOW (ref 12–28)
BUN: 5 mg/dL — ABNORMAL LOW (ref 8–27)
Bilirubin Total: 0.3 mg/dL (ref 0.0–1.2)
CO2: 21 mmol/L (ref 20–29)
Calcium: 10 mg/dL (ref 8.7–10.3)
Chloride: 101 mmol/L (ref 96–106)
Creatinine, Ser: 0.88 mg/dL (ref 0.57–1.00)
GFR calc Af Amer: 83 mL/min/1.73
GFR calc non Af Amer: 72 mL/min/1.73
Globulin, Total: 2.9 g/dL (ref 1.5–4.5)
Glucose: 333 mg/dL — ABNORMAL HIGH (ref 65–99)
Potassium: 4 mmol/L (ref 3.5–5.2)
Sodium: 140 mmol/L (ref 134–144)
Total Protein: 7.4 g/dL (ref 6.0–8.5)

## 2018-02-17 LAB — TSH+FREE T4
Free T4: 1.22 ng/dL (ref 0.82–1.77)
TSH: 0.843 u[IU]/mL (ref 0.450–4.500)

## 2018-02-18 ENCOUNTER — Encounter: Payer: Self-pay | Admitting: Family Medicine

## 2018-02-18 LAB — URINE CULTURE

## 2018-02-18 MED ORDER — GLIPIZIDE ER 5 MG PO TB24: 5.0000 mg | ORAL_TABLET | Freq: Every day | ORAL | 3 refills | Status: DC

## 2018-02-18 MED ORDER — BLOOD GLUCOSE MONITOR KIT: PACK | 0 refills | Status: DC

## 2018-02-18 MED ORDER — SULFAMETHOXAZOLE-TRIMETHOPRIM 800-160 MG PO TABS: 1.0000 | ORAL_TABLET | Freq: Two times a day (BID) | ORAL | 0 refills | Status: DC

## 2018-02-20 ENCOUNTER — Telehealth: Payer: Self-pay | Admitting: Internal Medicine

## 2018-02-20 ENCOUNTER — Telehealth: Payer: Self-pay | Admitting: *Deleted

## 2018-02-20 NOTE — Telephone Encounter (Signed)
Patient called back for missed called regarding results. Please follow up.

## 2018-02-20 NOTE — Telephone Encounter (Signed)
-----   Message from Antony Blackbird, MD sent at 02/18/2018  4:59 PM EST ----- Please notify patient that her blood sugar/glucose was elevated at 333- same as the reading that was obtained during her office visit.  Please asked patient to make sure that she is taking metformin and glipizide which were sent to her pharmacy along with prescription for diabetic testing supplies/glucometer.  Patient with an increase in alkaline phosphatase which is a nonspecific liver enzyme as well as mild increase in ALT at 42 with normal being 0-32.  Patient with normal complete blood count.  Anemia has resolved.  Thyroid blood work was normal.  Urine culture did show growth of bacteria.  Patient should return for repeat urinalysis if she continues to have symptoms suggestive of bladder infection such as lower abdominal pain, back pain, urinary frequency or dysuria

## 2018-02-20 NOTE — Telephone Encounter (Signed)
Medical Assistant left message on patient's home and cell voicemail. Voicemail states to give a call back to [place] with [facility] at [phone].  

## 2018-02-21 ENCOUNTER — Telehealth: Payer: Self-pay | Admitting: Internal Medicine

## 2018-02-21 NOTE — Telephone Encounter (Signed)
Patient called back to get their results. Following a missed call. Please follow up.

## 2018-02-21 NOTE — Telephone Encounter (Signed)
Medical Assistant left message on patient's home and cell voicemail. Voicemail states to give a call back to [place] with [facility] at [phone].  

## 2018-02-21 NOTE — Telephone Encounter (Signed)
Patient verified DOB Patient is aware of needing to adhere to DM medications and avoid alcohol and ibuprofen for elevated liver enzymes. Patient is aware of anemia being resolved and culture showing no growth. Patient aware of a urine recheck being completed if symptoms present. Patient was advised to always request pharmacist education prior to leaving the pharmacy if she is unsure of how to use her meter or a concern with medications. Patient will report to the pharmacy or the clinic. Patient also has a procedure on Thursday and will ask a nurse in the hospital for education on using her meter also.

## 2018-02-23 ENCOUNTER — Ambulatory Visit (HOSPITAL_COMMUNITY)
Admission: RE | Admit: 2018-02-23 | Discharge: 2018-02-23 | Disposition: A | Discharge status: Home / Self Care | Payer: BLUE CROSS/BLUE SHIELD | Source: Ambulatory Visit | Attending: Family Medicine | Admitting: Family Medicine

## 2018-02-23 DIAGNOSIS — R221 Localized swelling, mass and lump, neck: Secondary | ICD-10-CM | POA: Insufficient documentation

## 2018-02-23 DIAGNOSIS — E041 Nontoxic single thyroid nodule: Secondary | ICD-10-CM | POA: Diagnosis not present

## 2018-02-27 ENCOUNTER — Telehealth: Payer: Self-pay | Admitting: *Deleted

## 2018-02-27 NOTE — Telephone Encounter (Signed)
-----   Message from Antony Blackbird, MD sent at 02/25/2018  5:26 PM EST ----- Please let patient know that the area of enlargement that she feels in the right side of neck is a lymph node. The thyroid itself was normal in appearance

## 2018-02-27 NOTE — Telephone Encounter (Signed)
Patient verified DOB Patient is aware of lymph node being noted on the image but thyroid being normal in appearance. No further questions.

## 2018-07-04 ENCOUNTER — Other Ambulatory Visit: Payer: Self-pay | Admitting: Family Medicine

## 2018-07-04 DIAGNOSIS — E1165 Type 2 diabetes mellitus with hyperglycemia: Secondary | ICD-10-CM

## 2018-07-04 DIAGNOSIS — I10 Essential (primary) hypertension: Secondary | ICD-10-CM

## 2018-07-26 DIAGNOSIS — Z20828 Contact with and (suspected) exposure to other viral communicable diseases: Secondary | ICD-10-CM | POA: Diagnosis not present

## 2018-09-28 ENCOUNTER — Other Ambulatory Visit: Payer: Self-pay

## 2018-09-28 ENCOUNTER — Ambulatory Visit: Payer: BC Managed Care – PPO | Attending: Family Medicine | Admitting: Physician Assistant

## 2018-09-28 VITALS — BP 177/98 | HR 86 | Temp 98.6°F | Ht 63.0 in | Wt 205.0 lb

## 2018-09-28 DIAGNOSIS — I10 Essential (primary) hypertension: Secondary | ICD-10-CM

## 2018-09-28 DIAGNOSIS — Z23 Encounter for immunization: Secondary | ICD-10-CM | POA: Diagnosis not present

## 2018-09-28 DIAGNOSIS — E1165 Type 2 diabetes mellitus with hyperglycemia: Secondary | ICD-10-CM

## 2018-09-28 DIAGNOSIS — Z13220 Encounter for screening for lipoid disorders: Secondary | ICD-10-CM

## 2018-09-28 LAB — POCT GLYCOSYLATED HEMOGLOBIN (HGB A1C): Hemoglobin A1C: 9.6 % — AB (ref 4.0–5.6)

## 2018-09-28 LAB — GLUCOSE, POCT (MANUAL RESULT ENTRY): POC Glucose: 248 mg/dl — AB (ref 70–99)

## 2018-09-28 MED ORDER — GLIPIZIDE ER 5 MG PO TB24: 5.0000 mg | ORAL_TABLET | Freq: Every day | ORAL | 3 refills | Status: DC

## 2018-09-28 MED ORDER — METFORMIN HCL 1000 MG PO TABS: 500.0000 mg | ORAL_TABLET | Freq: Two times a day (BID) | ORAL | 1 refills | Status: DC

## 2018-09-28 MED ORDER — CONTOUR MONITOR W/DEVICE KIT: 1.0000 | PACK | Freq: Two times a day (BID) | 0 refills | Status: DC

## 2018-09-28 MED ORDER — AMLODIPINE BESYLATE 10 MG PO TABS: 10.0000 mg | ORAL_TABLET | Freq: Every day | ORAL | 1 refills | Status: DC

## 2018-09-28 MED ORDER — ATENOLOL 100 MG PO TABS: 100.0000 mg | ORAL_TABLET | Freq: Every day | ORAL | 1 refills | Status: DC

## 2018-09-28 MED ORDER — LANCETS 30G MISC: 1.0000 | Freq: Two times a day (BID) | 1 refills | Status: DC

## 2018-09-28 MED ORDER — GLUCOSE BLOOD VI STRP: ORAL_STRIP | 12 refills | Status: DC

## 2018-09-28 NOTE — Progress Notes (Signed)
Patient ID: Christine Rocha, female   DOB: 1955/09/20, 63 y.o.   MRN: [id number]   Lakia Gritton, is a 63 y.o. female  YQI:[id number]  LOV:[id number]  DOB - September 16, 1955  Subjective:  Chief Complaint and HPI: Christine Rocha is a 62 y.o. female here today and is out of BP meds and diabetes meds for about 1 month.  Glucose readings prior to running out of meds running ~90-160 until about 1 month ago.  BP was controlled ~118-122/78-82 prior to running out of all meds.  She also needs a new glucometer.  She has been doing better with diet and compliance overall until she ran out of meds.    Denies CP/SOB/DOE/dizziness.    ROS:   Constitutional:  No f/c, No night sweats, No unexplained weight loss. EENT:  No vision changes, No blurry vision, No hearing changes. No mouth, throat, or ear problems.  Respiratory: No cough, No SOB Cardiac: No CP, no palpitations GI:  No abd pain, No N/V/D. GU: No Urinary s/sx Musculoskeletal: No joint pain Neuro: No headache, no dizziness, no motor weakness.  Skin: No rash Endocrine:  No polydipsia. No polyuria.  Psych: Denies SI/HI  Problem  Diabetes (Hcc)    ALLERGIES: Allergies  Allergen Reactions  . Penicillins     PAST MEDICAL HISTORY: Past Medical History:  Diagnosis Date  . Hypertension   . Seizures ([place])     MEDICATIONS AT HOME: Prior to Admission medications   Medication Sig Start Date End Date Taking? Authorizing Provider  acetaminophen (TYLENOL) 325 MG tablet Take 1-2 tablets (325-650 mg total) by mouth every 6 (six) hours as needed for mild pain (pain score 1-3 or temp > 100.5). 04/28/17  Yes Tawny Asal, MD  amLODipine (NORVASC) 10 MG tablet Take 1 tablet (10 mg total) by mouth daily. 09/28/18  Yes Freeman Caldron M, PA-C  atenolol (TENORMIN) 100 MG tablet Take 1 tablet (100 mg total) by mouth daily. 09/28/18  Yes McClung, Angela M, PA-C  buPROPion (ZYBAN) 150 MG 12 hr tablet Take 1 tablet (150 mg total) by mouth 2 (two) times daily. 05/26/16   Yes Tresa Garter, MD  carboxymethylcellulose (REFRESH PLUS) 0.5 % SOLN Place 1 drop into both eyes daily as needed (dry eyes).   Yes [provider]  glipiZIDE (GLUCOTROL XL) 5 MG 24 hr tablet Take 1 tablet (5 mg total) by mouth daily with breakfast. Or before the first meal of the day to lower blood sugar 09/28/18  Yes McClung, Angela M, PA-C  magnesium citrate SOLN Take 296 mLs (1 Bottle total) by mouth once as needed for severe constipation. 04/28/17  Yes Tawny Asal, MD  metFORMIN (GLUCOPHAGE) 1000 MG tablet Take 0.5 tablets (500 mg total) by mouth 2 (two) times daily with a meal. To help control blood sugars 09/28/18  Yes McClung, Dionne Bucy, PA-C  Multiple Vitamins-Minerals (WOMENS 50+ MULTI VITAMIN/MIN) TABS Take 1 tablet by mouth daily.   Yes [provider]  polyethylene glycol (MIRALAX / GLYCOLAX) packet Take 17 g by mouth daily. 04/28/17  Yes Tawny Asal, MD  senna-docusate (SENOKOT-S) 8.6-50 MG tablet Take 1 tablet by mouth 2 (two) times daily. 04/28/17  Yes Tawny Asal, MD  Blood Glucose Monitoring Suppl (CONTOUR MONITOR) w/Device KIT 1 each by Does not apply route 2 (two) times daily. 09/28/18   Argentina Donovan, PA-C  glucose blood test strip Use as instructed 09/28/18   Argentina Donovan, PA-C  Lancets 30G MISC 1 each by Does not apply  route 2 (two) times daily. 09/28/18   Argentina Donovan, PA-C     Objective:  EXAM:   Vitals:   09/28/18 0854  BP: (!) 177/98  Pulse: 86  Temp: 98.6 F (37 C)  TempSrc: Oral  Weight: 205 lb (93 kg)  Height: _0  (1.6 m)    General appearance : A&OX3. NAD. Non-toxic-appearing HEENT: Atraumatic and Normocephalic.  PERRLA. EOM intact.  Neck: supple, no JVD. No cervical lymphadenopathy. No thyromegaly Chest/Lungs:  Breathing-non-labored, Good air entry bilaterally, breath sounds normal without rales, rhonchi, or wheezing  CVS: S1 S2 regular, no murmurs, gallops, rubs  Extremities: Bilateral Lower Ext shows no edema,  both legs are warm to touch with = pulse throughout Neurology:  CN II-XII grossly intact, Non focal.   Psych:  TP mostly linear. J/I fair. Normal speech. Appropriate eye contact and affect.  Skin:  No Rash  Data Review Lab Results  Component Value Date   HGBA1C 9.6 (A) 09/28/2018   HGBA1C 13.2 (A) 02/16/2018   HGBA1C 6.8 05/26/2016     Assessment & Plan   1. Type 2 diabetes mellitus with hyperglycemia, without long-term current use of insulin (HCC) Uncontrolled but improved even though out of meds for 1 month-glucose readings she brought in looked pretty good before she ran out of meds 1 month ago.  Resume meds.  Work on diabetic diet.  Check blood sugars 2-3 times daily - POCT glucose (manual entry) - POCT glycosylated hemoglobin (Hb A1C) - Blood Glucose Monitoring Suppl (CONTOUR MONITOR) w/Device KIT; 1 each by Does not apply route 2 (two) times daily.  Dispense: 1 kit; Refill: 0 - glucose blood test strip; Use as instructed  Dispense: 100 each; Refill: 12 - glipiZIDE (GLUCOTROL XL) 5 MG 24 hr tablet; Take 1 tablet (5 mg total) by mouth daily with breakfast. Or before the first meal of the day to lower blood sugar  Dispense: 30 tablet; Refill: 3 - metFORMIN (GLUCOPHAGE) 1000 MG tablet; Take 0.5 tablets (500 mg total) by mouth 2 (two) times daily with a meal. To help control blood sugars  Dispense: 180 tablet; Refill: 1 - Lancets 30G MISC; 1 each by Does not apply route 2 (two) times daily.  Dispense: 100 each; Refill: 1 - Comprehensive metabolic panel - Lipid panel - CBC with Differential/Platelet  2. Essential hypertension Out of meds but will resume meds and was controlled based on home BP readings prior to running out of meds - amLODipine (NORVASC) 10 MG tablet; Take 1 tablet (10 mg total) by mouth daily.  Dispense: 90 tablet; Refill: 1 - atenolol (TENORMIN) 100 MG tablet; Take 1 tablet (100 mg total) by mouth daily.  Dispense: 90 tablet; Refill: 1 - Comprehensive metabolic  panel - CBC with Differential/Platelet  3. Screening, lipid  - Lipid panel  4. Need for immunization against influenza  - Flu Vaccine QUAD 6+ mos PF IM (Fluarix Quad PF)     Patient have been counseled extensively about nutrition and exercise  Return in about 3 months (around 12/28/2018) for PCP chronic conditions.  The patient was given clear instructions to go to ER or return to medical center if symptoms don't improve, worsen or new problems develop. The patient verbalized understanding. The patient was told to call to get lab results if they haven't heard anything in the next week.     Freeman Caldron, PA-C [facility] and [place], [place]   09/28/2018, 12:35 PM

## 2018-09-29 LAB — CBC WITH DIFFERENTIAL/PLATELET
Basophils Absolute: 0 10*3/uL (ref 0.0–0.2)
Basos: 1 %
EOS (ABSOLUTE): 0.1 10*3/uL (ref 0.0–0.4)
Eos: 2 %
Hematocrit: 41.6 % (ref 34.0–46.6)
Hemoglobin: 13.7 g/dL (ref 11.1–15.9)
Immature Grans (Abs): 0 10*3/uL (ref 0.0–0.1)
Immature Granulocytes: 0 %
Lymphocytes Absolute: 1.9 10*3/uL (ref 0.7–3.1)
Lymphs: 40 %
MCH: 28.5 pg (ref 26.6–33.0)
MCHC: 32.9 g/dL (ref 31.5–35.7)
MCV: 87 fL (ref 79–97)
Monocytes Absolute: 0.3 10*3/uL (ref 0.1–0.9)
Monocytes: 6 %
Neutrophils Absolute: 2.5 10*3/uL (ref 1.4–7.0)
Neutrophils: 51 %
Platelets: 291 10*3/uL (ref 150–450)
RBC: 4.81 x10E6/uL (ref 3.77–5.28)
RDW: 13.1 % (ref 11.7–15.4)
WBC: 4.8 10*3/uL (ref 3.4–10.8)

## 2018-09-29 LAB — COMPREHENSIVE METABOLIC PANEL
ALT: 38 IU/L — ABNORMAL HIGH (ref 0–32)
AST: 18 IU/L (ref 0–40)
Albumin/Globulin Ratio: 1.7 (ref 1.2–2.2)
Albumin: 4.6 g/dL (ref 3.8–4.8)
Alkaline Phosphatase: 110 IU/L (ref 39–117)
BUN/Creatinine Ratio: 10 — ABNORMAL LOW (ref 12–28)
BUN: 8 mg/dL (ref 8–27)
Bilirubin Total: 0.2 mg/dL (ref 0.0–1.2)
CO2: 23 mmol/L (ref 20–29)
Calcium: 9.8 mg/dL (ref 8.7–10.3)
Chloride: 103 mmol/L (ref 96–106)
Creatinine, Ser: 0.81 mg/dL (ref 0.57–1.00)
GFR calc Af Amer: 91 mL/min/{1.73_m2} (ref >59)
GFR calc non Af Amer: 79 mL/min/{1.73_m2} (ref >59)
Globulin, Total: 2.7 g/dL (ref 1.5–4.5)
Glucose: 243 mg/dL — ABNORMAL HIGH (ref 65–99)
Potassium: 4.2 mmol/L (ref 3.5–5.2)
Sodium: 140 mmol/L (ref 134–144)
Total Protein: 7.3 g/dL (ref 6.0–8.5)

## 2018-09-29 LAB — LIPID PANEL
Chol/HDL Ratio: 3.9 ratio (ref 0.0–4.4)
Cholesterol, Total: 137 mg/dL (ref 100–199)
HDL: 35 mg/dL — ABNORMAL LOW (ref >39)
LDL Chol Calc (NIH): 82 mg/dL (ref 0–99)
Triglycerides: 107 mg/dL (ref 0–149)
VLDL Cholesterol Cal: 20 mg/dL (ref 5–40)

## 2018-10-05 ENCOUNTER — Other Ambulatory Visit: Payer: Self-pay | Admitting: Pharmacist

## 2018-10-05 DIAGNOSIS — E1165 Type 2 diabetes mellitus with hyperglycemia: Secondary | ICD-10-CM

## 2018-10-05 MED ORDER — GLUCOSE BLOOD VI STRP: ORAL_STRIP | 6 refills | Status: DC

## 2018-10-12 ENCOUNTER — Other Ambulatory Visit: Payer: Self-pay | Admitting: Internal Medicine

## 2018-10-12 DIAGNOSIS — Z1231 Encounter for screening mammogram for malignant neoplasm of breast: Secondary | ICD-10-CM

## 2018-11-29 ENCOUNTER — Ambulatory Visit
Admission: RE | Admit: 2018-11-29 | Discharge: 2018-11-29 | Disposition: A | Discharge status: Home / Self Care | Payer: BC Managed Care – PPO | Source: Ambulatory Visit | Attending: Internal Medicine | Admitting: Internal Medicine

## 2018-11-29 ENCOUNTER — Other Ambulatory Visit: Payer: Self-pay

## 2018-11-29 DIAGNOSIS — Z1231 Encounter for screening mammogram for malignant neoplasm of breast: Secondary | ICD-10-CM

## 2018-12-04 ENCOUNTER — Telehealth: Payer: Self-pay | Admitting: *Deleted

## 2018-12-04 NOTE — Telephone Encounter (Signed)
-----   Message from Tresa Garter, MD sent at 11/29/2018  5:46 PM EST ----- Please inform patient that her screening mammogram shows no evidence of malignancy. Recommend screening mammogram in one year

## 2018-12-04 NOTE — Telephone Encounter (Signed)
Medical Assistant left message on patient's home and cell voicemail. Voicemail states to give a call back to [place] with [facility] at [phone]. Patient is aware of mammogram being negative for malignancy and repeat in one year.

## 2018-12-15 ENCOUNTER — Other Ambulatory Visit: Payer: Self-pay | Admitting: Physician Assistant

## 2018-12-15 DIAGNOSIS — E1165 Type 2 diabetes mellitus with hyperglycemia: Secondary | ICD-10-CM

## 2018-12-28 ENCOUNTER — Other Ambulatory Visit: Payer: Self-pay

## 2018-12-28 ENCOUNTER — Ambulatory Visit: Payer: BC Managed Care – PPO | Attending: Family Medicine | Admitting: Family Medicine

## 2018-12-28 ENCOUNTER — Encounter: Payer: Self-pay | Admitting: Family Medicine

## 2018-12-28 DIAGNOSIS — I10 Essential (primary) hypertension: Secondary | ICD-10-CM

## 2018-12-28 DIAGNOSIS — E1165 Type 2 diabetes mellitus with hyperglycemia: Secondary | ICD-10-CM

## 2018-12-28 DIAGNOSIS — Z79899 Other long term (current) drug therapy: Secondary | ICD-10-CM | POA: Diagnosis not present

## 2018-12-28 MED ORDER — LANCETS 30G MISC: 1.0000 | Freq: Two times a day (BID) | 11 refills | Status: DC

## 2018-12-28 NOTE — Progress Notes (Signed)
Virtual Visit via Telephone Note  I connected with Christine Rocha on 12/28/18 at  8:30 AM EST by telephone and verified that I am speaking with the correct person using two identifiers.   I discussed the limitations, risks, security and privacy concerns of performing an evaluation and management service by telephone and the availability of in person appointments. I also discussed with the patient that there may be a patient responsible charge related to this service. The patient expressed understanding and agreed to proceed.  Patient Location: Home Provider Location: [facility]  Others participating in call: none   History of Present Illness:       63 yo female seen in follow-up of chronic medical issues.  She was last seen via telemedicine on 09/28/2018 by another provider.  At that time, patient's hemoglobin A1c was elevated at 9.6 but was greatly improved from hemoglobin A1c of 13.2 in February of this year.  Patient reports that she is compliant with her diabetes medications and is checking her blood sugars twice daily.  She generally takes blood sugars after eating breakfast and then later in the day.  Blood sugars are in the 170s to 180s.  Her blood sugar this morning was 173 after breakfast and last night was 138 after dinner.  She denies increased thirst, no urinary frequency and no blurred vision at this time related to diabetes.  She denies any numbness or tingling in her feet and no active skin breakdown or calluses on the feet.        She continues to take her blood pressure medication and checks home blood pressures.  Blood pressures are in the 130s over 70s to 80s.  Blood pressure last night was 126/64 with a heart rate of 61.  She denies any headaches or dizziness related to her blood pressure.  No episodes of focal numbness or weakness and no impaired speech.  She denies any current issues with depression.  She did have slightly abnormal depression screen at a past visit but she states  that at that time she was feeling a little depressed because her husband was always at work and she was home alone but she now has small dog and no longer feels lonely or depressed.         Overall she feels well, she denies any headaches or dizziness, no chest pain or palpitations, no sore throat or difficulty swallowing.  No abdominal pain, no nausea/vomiting/diarrhea or constipation.  Past Medical History:  Diagnosis Date  . Hypertension   . Seizures ([place])     Past Surgical History:  Procedure Laterality Date  . ORIF FEMUR FRACTURE Left 04/24/2017   Procedure: OPEN REDUCTION INTERNAL FIXATION (ORIF) DISTAL FEMUR FRACTURE;  Surgeon: [name], MD;  Location: [facility];  Service: Orthopedics;  Laterality: Left;    Family History  Problem Relation Age of Onset  . Hypertension Mother   . Hypertension Sister   . Diabetes Sister     Social History   Tobacco Use  . Smoking status: Current Every Day Smoker    Packs/day: 0.25    Types: Cigarettes  . Smokeless tobacco: Never Used  Substance Use Topics  . Alcohol use: No  . Drug use: No     Allergies  Allergen Reactions  . Penicillins        Observations/Objective: No vital signs or physical exam conducted as visit was done via telephone  Assessment and Plan: 1. Type 2 diabetes mellitus with hyperglycemia, without long-term  current use of insulin (HCC) Continue use of Metformin and glipizide along with low carbohydrate diet and exercise such as walking to help with control of blood sugars.  Continue to monitor blood sugars.  She states that she can come into the office today for blood work and will have comprehensive metabolic panel, microalbumin creatinine ratio and urinalysis at that time.  Referral placed for diabetic eye exam. - Comprehensive metabolic panel - Microalbumin/Creatinine Ratio, Urine - POCT URINALYSIS DIP (CLINITEK) - Lancets 30G MISC; 1 each by Does not apply route 2 (two) times daily.  Dispense: 100 each;  Refill: 11 - Ambulatory referral to Ophthalmology  2. Essential hypertension She is currently on atenolol and amlodipine for hypertension.  As patient is diabetic, she will likely also benefit from an ACE inhibitor.  She will have microalbumin creatinine ratio later today and urinalysis.  Discussed use of ACE inhibitor to help protect the kidneys from the effects of diabetes. - Microalbumin/Creatinine Ratio, Urine - POCT URINALYSIS DIP (CLINITEK)  3. Encounter for long-term (current) use of medications She will have comprehensive metabolic panel in follow-up of long-term use of medications for treatment of diabetes and hypertension. - Comprehensive metabolic panel  Follow Up Instructions:Return in about 4 months (around 04/28/2019) for DM/HTN.  An After Visit Summary was printed and will be given to the patient. Patient will be here today for labs.   I discussed the assessment and treatment plan with the patient. The patient was provided an opportunity to ask questions and all were answered. The patient agreed with the plan and demonstrated an understanding of the instructions.   The patient was advised to call back or seek an in-person evaluation if the symptoms worsen or if the condition fails to improve as anticipated.  I provided 10 minutes of non-face-to-face time during this encounter.   Antony Blackbird, MD

## 2018-12-28 NOTE — Progress Notes (Signed)
Patient verified DOB Patient has eaten today. Patient has not taken medication today. Patient denies pain at this time. BP last night was 126/64 HR:61 CBG today was 173 after eating breakfast. CBG last night was 138 after dinner. Patient request a refill on medications and needles.

## 2018-12-28 NOTE — Patient Instructions (Signed)
Blood Glucose Monitoring, Adult Monitoring your blood sugar (glucose) is an important part of managing your diabetes (diabetes mellitus). Blood glucose monitoring involves checking your blood glucose as often as directed and keeping a record (log) of your results over time. Checking your blood glucose regularly and keeping a blood glucose log can:  Help you and your health care provider adjust your diabetes management plan as needed, including your medicines or insulin.  Help you understand how food, exercise, illnesses, and medicines affect your blood glucose.  Let you know what your blood glucose is at any time. You can quickly find out if you have low blood glucose (hypoglycemia) or high blood glucose (hyperglycemia). Your health care provider will set individualized treatment goals for you. Your goals will be based on your age, other medical conditions you have, and how you respond to diabetes treatment. Generally, the goal of treatment is to maintain the following blood glucose levels:  Before meals (preprandial): 80-130 mg/dL (4.4-7.2 mmol/L).  After meals (postprandial): below 180 mg/dL (10 mmol/L).  A1c level: less than 7%. Supplies needed:  Blood glucose meter.  Test strips for your meter. Each meter has its own strips. You must use the strips that came with your meter.  A needle to prick your finger (lancet). Do not use a lancet more than one time.  A device that holds the lancet (lancing device).  A journal or log book to write down your results. How to check your blood glucose  1. Wash your hands with soap and water. 2. Prick the side of your finger (not the tip) with the lancet. Use a different finger each time. 3. Gently rub the finger until a small drop of blood appears. 4. Follow instructions that come with your meter for inserting the test strip, applying blood to the strip, and using your blood glucose meter. 5. Write down your result and any notes. Some meters  allow you to use areas of your body other than your finger (alternative sites) to test your blood. The most common alternative sites are:  Forearm.  Thigh.  Palm of the hand. If you think you may have hypoglycemia, or if you have a history of not knowing when your blood glucose is getting low (hypoglycemia unawareness), do not use alternative sites. Use your finger instead. Alternative sites may not be as accurate as the fingers, because blood flow is slower in these areas. This means that the result you get may be delayed, and it may be different from the result that you would get from your finger. Follow these instructions at home: Blood glucose log   Every time you check your blood glucose, write down your result. Also write down any notes about things that may be affecting your blood glucose, such as your diet and exercise for the day. This information can help you and your health care provider: ? Look for patterns in your blood glucose over time. ? Adjust your diabetes management plan as needed.  Check if your meter allows you to download your records to a computer. Most glucose meters store a record of glucose readings in the meter. If you have type 1 diabetes:  Check your blood glucose 2 or more times a day.  Also check your blood glucose: ? Before every insulin injection. ? Before and after exercise. ? Before meals. ? 2 hours after a meal. ? Occasionally between 2:00 a.m. and 3:00 a.m., as directed. ? Before potentially dangerous tasks, like driving or using heavy machinery. ?   At bedtime.  You may need to check your blood glucose more often, up to 6-10 times a day, if you: ? Use an insulin pump. ? Need multiple daily injections (MDI). ? Have diabetes that is not well-controlled. ? Are ill. ? Have a history of severe hypoglycemia. ? Have hypoglycemia unawareness. If you have type 2 diabetes:  If you take insulin or other diabetes medicines, check your blood glucose 2 or  more times a day.  If you are on intensive insulin therapy, check your blood glucose 4 or more times a day. Occasionally, you may also need to check between 2:00 a.m. and 3:00 a.m., as directed.  Also check your blood glucose: ? Before and after exercise. ? Before potentially dangerous tasks, like driving or using heavy machinery.  You may need to check your blood glucose more often if: ? Your medicine is being adjusted. ? Your diabetes is not well-controlled. ? You are ill. General tips  Always keep your supplies with you.  If you have questions or need help, all blood glucose meters have a 24-hour "hotline" phone number that you can call. You may also contact your health care provider.  After you use a few boxes of test strips, adjust (calibrate) your blood glucose meter by following instructions that came with your meter. Contact a health care provider if:  Your blood glucose is at or above 240 mg/dL (13.3 mmol/L) for 2 days in a row.  You have been sick or have had a fever for 2 days or longer, and you are not getting better.  You have any of the following problems for more than 6 hours: ? You cannot eat or drink. ? You have nausea or vomiting. ? You have diarrhea. Get help right away if:  Your blood glucose is lower than 54 mg/dL (3 mmol/L).  You become confused or you have trouble thinking clearly.  You have difficulty breathing.  You have moderate or large ketone levels in your urine. Summary  Monitoring your blood sugar (glucose) is an important part of managing your diabetes (diabetes mellitus).  Blood glucose monitoring involves checking your blood glucose as often as directed and keeping a record (log) of your results over time.  Your health care provider will set individualized treatment goals for you. Your goals will be based on your age, other medical conditions you have, and how you respond to diabetes treatment.  Every time you check your blood glucose,  write down your result. Also write down any notes about things that may be affecting your blood glucose, such as your diet and exercise for the day. This information is not intended to replace advice given to you by your health care provider. Make sure you discuss any questions you have with your health care provider. Document Released: 12/31/2002 Document Revised: 10/21/2017 Document Reviewed: 06/09/2015 Elsevier Patient Education  2020 [employer].  Steps to Quit Smoking Smoking tobacco is the leading cause of preventable death. It can affect almost every organ in the body. Smoking puts you and people around you at risk for many serious, long-lasting (chronic) diseases. Quitting smoking can be hard, but it is one of the best things that you can do for your health. It is never too late to quit. How do I get ready to quit? When you decide to quit smoking, make a plan to help you succeed. Before you quit:  Pick a date to quit. Set a date within the next 2 weeks to give you time  to prepare.  Write down the reasons why you are quitting. Keep this list in places where you will see it often.  Tell your family, friends, and co-workers that you are quitting. Their support is important.  Talk with your doctor about the choices that may help you quit.  Find out if your health insurance will pay for these treatments.  Know the people, places, things, and activities that make you want to smoke (triggers). Avoid them. What first steps can I take to quit smoking?  Throw away all cigarettes at home, at work, and in your car.  Throw away the things that you use when you smoke, such as ashtrays and lighters.  Clean your car. Make sure to empty the ashtray.  Clean your home, including curtains and carpets. What can I do to help me quit smoking? Talk with your doctor about taking medicines and seeing a counselor at the same time. You are more likely to succeed when you do both.  If you are pregnant  or breastfeeding, talk with your doctor about counseling or other ways to quit smoking. Do not take medicine to help you quit smoking unless your doctor tells you to do so. To quit smoking: Quit right away  Quit smoking totally, instead of slowly cutting back on how much you smoke over a period of time.  Go to counseling. You are more likely to quit if you go to counseling sessions regularly. Take medicine You may take medicines to help you quit. Some medicines need a prescription, and some you can buy over-the-counter. Some medicines may contain a drug called nicotine to replace the nicotine in cigarettes. Medicines may:  Help you to stop having the desire to smoke (cravings).  Help to stop the problems that come when you stop smoking (withdrawal symptoms). Your doctor may ask you to use:  Nicotine patches, gum, or lozenges.  Nicotine inhalers or sprays.  Non-nicotine medicine that is taken by mouth. Find resources Find resources and other ways to help you quit smoking and remain smoke-free after you quit. These resources are most helpful when you use them often. They include:  Online chats with a Social worker.  Phone quitlines.  Printed Furniture conservator/restorer.  Support groups or group counseling.  Text messaging programs.  Mobile phone apps. Use apps on your mobile phone or tablet that can help you stick to your quit plan. There are many free apps for mobile phones and tablets as well as websites. Examples include Quit Guide from the [employer] and smokefree.gov  What things can I do to make it easier to quit?   Talk to your family and friends. Ask them to support and encourage you.  Call a phone quitline (1-800-QUIT-NOW), reach out to support groups, or work with a Social worker.  Ask people who smoke to not smoke around you.  Avoid places that make you want to smoke, such as: ? Bars. ? Parties. ? Smoke-break areas at work.  Spend time with people who do not smoke.  Lower the  stress in your life. Stress can make you want to smoke. Try these things to help your stress: ? Getting regular exercise. ? Doing deep-breathing exercises. ? Doing yoga. ? Meditating. ? Doing a body scan. To do this, close your eyes, focus on one area of your body at a time from head to toe. Notice which parts of your body are tense. Try to relax the muscles in those areas. How will I feel when I quit smoking? Day  1 to 3 weeks Within the first 24 hours, you may start to have some problems that come from quitting tobacco. These problems are very bad 2-3 days after you quit, but they do not often last for more than 2-3 weeks. You may get these symptoms:  Mood swings.  Feeling restless, nervous, angry, or annoyed.  Trouble concentrating.  Dizziness.  Strong desire for high-sugar foods and nicotine.  Weight gain.  Trouble pooping (constipation).  Feeling like you may vomit (nausea).  Coughing or a sore throat.  Changes in how the medicines that you take for other issues work in your body.  Depression.  Trouble sleeping (insomnia). Week 3 and afterward After the first 2-3 weeks of quitting, you may start to notice more positive results, such as:  Better sense of smell and taste.  Less coughing and sore throat.  Slower heart rate.  Lower blood pressure.  Clearer skin.  Better breathing.  Fewer sick days. Quitting smoking can be hard. Do not give up if you fail the first time. Some people need to try a few times before they succeed. Do your best to stick to your quit plan, and talk with your doctor if you have any questions or concerns. Summary  Smoking tobacco is the leading cause of preventable death. Quitting smoking can be hard, but it is one of the best things that you can do for your health.  When you decide to quit smoking, make a plan to help you succeed.  Quit smoking right away, not slowly over a period of time.  When you start quitting, seek help from  your doctor, family, or friends. This information is not intended to replace advice given to you by your health care provider. Make sure you discuss any questions you have with your health care provider. Document Released: 10/24/2008 Document Revised: 03/17/2018 Document Reviewed: 03/18/2018 Elsevier Patient Education  2020 [employer].

## 2018-12-29 LAB — COMPREHENSIVE METABOLIC PANEL WITH GFR
ALT: 35 IU/L — ABNORMAL HIGH (ref 0–32)
AST: 22 IU/L (ref 0–40)
Albumin/Globulin Ratio: 1.8 (ref 1.2–2.2)
Albumin: 4.6 g/dL (ref 3.8–4.8)
Alkaline Phosphatase: 99 IU/L (ref 39–117)
BUN/Creatinine Ratio: 6 — ABNORMAL LOW (ref 12–28)
BUN: 5 mg/dL — ABNORMAL LOW (ref 8–27)
Bilirubin Total: <0.2 mg/dL (ref 0.0–1.2)
CO2: 23 mmol/L (ref 20–29)
Calcium: 9.5 mg/dL (ref 8.7–10.3)
Chloride: 102 mmol/L (ref 96–106)
Creatinine, Ser: 0.79 mg/dL (ref 0.57–1.00)
GFR calc Af Amer: 93 mL/min/1.73
GFR calc non Af Amer: 81 mL/min/1.73
Globulin, Total: 2.5 g/dL (ref 1.5–4.5)
Glucose: 176 mg/dL — ABNORMAL HIGH (ref 65–99)
Potassium: 3.9 mmol/L (ref 3.5–5.2)
Sodium: 141 mmol/L (ref 134–144)
Total Protein: 7.1 g/dL (ref 6.0–8.5)

## 2018-12-29 LAB — MICROALBUMIN / CREATININE URINE RATIO
Creatinine, Urine: 64.9 mg/dL
Microalb/Creat Ratio: 7 mg/g{creat} (ref 0–29)
Microalbumin, Urine: 4.3 ug/mL

## 2019-01-08 ENCOUNTER — Telehealth: Payer: Self-pay | Admitting: Family Medicine

## 2019-01-08 NOTE — Telephone Encounter (Signed)
.  Pt name and DOB verified. Patient aware of results and result note per Dr. Chapman Fitch.

## 2019-01-08 NOTE — Telephone Encounter (Signed)
Patient called for labs  °

## 2019-01-12 HISTORY — PX: COLONOSCOPY: SHX174

## 2019-01-15 ENCOUNTER — Other Ambulatory Visit: Payer: Self-pay | Admitting: Family Medicine

## 2019-01-15 DIAGNOSIS — E1165 Type 2 diabetes mellitus with hyperglycemia: Secondary | ICD-10-CM

## 2019-03-27 ENCOUNTER — Ambulatory Visit: Payer: BC Managed Care – PPO | Attending: Family Medicine | Admitting: Physician Assistant

## 2019-03-27 ENCOUNTER — Other Ambulatory Visit: Payer: Self-pay

## 2019-03-27 VITALS — BP 176/97 | HR 97 | Temp 98.0°F | Resp 18 | Ht 62.0 in | Wt 203.0 lb

## 2019-03-27 DIAGNOSIS — I10 Essential (primary) hypertension: Secondary | ICD-10-CM

## 2019-03-27 DIAGNOSIS — R739 Hyperglycemia, unspecified: Secondary | ICD-10-CM

## 2019-03-27 DIAGNOSIS — Z9114 Patient's other noncompliance with medication regimen: Secondary | ICD-10-CM

## 2019-03-27 DIAGNOSIS — E1165 Type 2 diabetes mellitus with hyperglycemia: Secondary | ICD-10-CM

## 2019-03-27 DIAGNOSIS — B372 Candidiasis of skin and nail: Secondary | ICD-10-CM

## 2019-03-27 DIAGNOSIS — F322 Major depressive disorder, single episode, severe without psychotic features: Secondary | ICD-10-CM

## 2019-03-27 LAB — POCT GLYCOSYLATED HEMOGLOBIN (HGB A1C): Hemoglobin A1C: 9.6 % — AB (ref 4.0–5.6)

## 2019-03-27 LAB — GLUCOSE, POCT (MANUAL RESULT ENTRY)
POC Glucose: 298 mg/dl — AB (ref 70–99)
POC Glucose: 270 mg/dl — AB (ref 70–99)

## 2019-03-27 MED ORDER — SERTRALINE HCL 50 MG PO TABS: 50.0000 mg | ORAL_TABLET | Freq: Every day | ORAL | 3 refills | Status: DC

## 2019-03-27 MED ORDER — INSULIN ASPART 100 UNIT/ML ~~LOC~~ SOLN
8.0000 [IU] | Freq: Once | SUBCUTANEOUS | Status: AC
  Administered 2019-03-27: 8 [IU] via SUBCUTANEOUS

## 2019-03-27 NOTE — Progress Notes (Signed)
Established Patient Office Visit  Subjective:  Patient ID: Christine Rocha, female    DOB: 03/19/1955  Age: 64 y.o. MRN: [id number]  CC: No chief complaint on file.  SHPI Christine Rocha reports that Christine Rocha had started having itching and burning in the creases of her upper inner thighs and under her breasts yesterday.  Reports that Christine Rocha tried washing with soap and water, states that it made it worse.  Has not tried anything else.  States that Christine Rocha has been feeling depressed, states this started last year after having leg surgery and not being able to work.  Reports that Christine Rocha did get a small dog, and this has helped somewhat.  Reports however Christine Rocha feels her depression is getting worse, does not want to do anything other than walk her dog, has been oversleeping, and has stopped taking her medications.  Reports Christine Rocha has not taken her medications in the past 3 weeks.  Reports that Christine Rocha has increased the amount of cigarettes that Christine Rocha has been smoking, will smoke up to 2 packs of cigarettes a day.  Reports that Christine Rocha has never been treated for depression or anxiety before but would like to try medication to help her with this.  Reports that Christine Rocha sleeps in the recliner in her living room due to fear of falling out of her bed.  Reports that Christine Rocha does wake up sweating approximately every other night.   Past Medical History:  Diagnosis Date  . Hypertension   . Seizures ([place])     Past Surgical History:  Procedure Laterality Date  . ORIF FEMUR FRACTURE Left 04/24/2017   Procedure: OPEN REDUCTION INTERNAL FIXATION (ORIF) DISTAL FEMUR FRACTURE;  Surgeon: [name], MD;  Location: [facility];  Service: Orthopedics;  Laterality: Left;    Family History  Problem Relation Age of Onset  . Hypertension Mother   . Hypertension Sister   . Diabetes Sister     Social History   Socioeconomic History  . Marital status: Married    Spouse name: Not on file  . Number of children: Not on file  . Years of education: Not  on file  . Highest education level: Not on file  Occupational History  . Not on file  Tobacco Use  . Smoking status: Current Every Day Smoker    Packs/day: 0.25    Types: Cigarettes  . Smokeless tobacco: Never Used  Substance and Sexual Activity  . Alcohol use: No  . Drug use: No  . Sexual activity: Never    Birth control/protection: None  Other Topics Concern  . Not on file  Social History Narrative  . Not on file   Social Determinants of Health   Financial Resource Strain:   . Difficulty of Paying Living Expenses:   Food Insecurity:   . Worried About Charity fundraiser in the Last Year:   . Arboriculturist in the Last Year:   Transportation Needs:   . Film/video editor (Medical):   [name] Lack of Transportation (Non-Medical):   Physical Activity:   . Days of Exercise per Week:   . Minutes of Exercise per Session:   Stress:   . Feeling of Stress :   Social Connections:   . Frequency of Communication with Friends and Family:   . Frequency of Social Gatherings with Friends and Family:   . Attends Religious Services:   . Active Member of Clubs or Organizations:   . Attends Archivist Meetings:   .  Marital Status:   Intimate Partner Violence:   . Fear of Current or Ex-Partner:   . Emotionally Abused:   [name] Physically Abused:   . Sexually Abused:     Outpatient Medications Prior to Visit  Medication Sig Dispense Refill  . acetaminophen (TYLENOL) 325 MG tablet Take 1-2 tablets (325-650 mg total) by mouth every 6 (six) hours as needed for mild pain (pain score 1-3 or temp > 100.5). (Patient not taking: Reported on 03/27/2019)    . amLODipine (NORVASC) 10 MG tablet Take 1 tablet (10 mg total) by mouth daily. (Patient not taking: Reported on 03/27/2019) 90 tablet 1  . atenolol (TENORMIN) 100 MG tablet Take 1 tablet (100 mg total) by mouth daily. (Patient not taking: Reported on 03/27/2019) 90 tablet 1  . Blood Glucose Monitoring Suppl (CONTOUR MONITOR) w/Device KIT  1 each by Does not apply route 2 (two) times daily. (Patient not taking: Reported on 03/27/2019) 1 kit 0  . carboxymethylcellulose (REFRESH PLUS) 0.5 % SOLN Place 1 drop into both eyes daily as needed (dry eyes).    [name] glipiZIDE (GLUCOTROL XL) 5 MG 24 hr tablet TAKE 1 TABLET BY MOUTH EVERY DAY WITH BREAKFAST OR BEFORE THE FIRST MEAL OF THE DAY TO LOWER BLOOD SUGAR (Patient not taking: Reported on 03/27/2019) 90 tablet 0  . glucose blood test strip Use as instructed to check blood sugar twice daily. DX E11.65 (Patient not taking: Reported on 03/27/2019) 100 each 6  . Lancets 30G MISC 1 each by Does not apply route 2 (two) times daily. (Patient not taking: Reported on 03/27/2019) 100 each 11  . magnesium citrate SOLN Take 296 mLs (1 Bottle total) by mouth once as needed for severe constipation. (Patient not taking: Reported on 03/27/2019) 195 mL   . metFORMIN (GLUCOPHAGE) 1000 MG tablet Take 0.5 tablets (500 mg total) by mouth 2 (two) times daily with a meal. To help control blood sugars (Patient not taking: Reported on 03/27/2019) 180 tablet 1  . Multiple Vitamins-Minerals (WOMENS 50+ MULTI VITAMIN/MIN) TABS Take 1 tablet by mouth daily.    . polyethylene glycol (MIRALAX / GLYCOLAX) packet Take 17 g by mouth daily. (Patient not taking: Reported on 12/28/2018) 14 each 0  . senna-docusate (SENOKOT-S) 8.6-50 MG tablet Take 1 tablet by mouth 2 (two) times daily. (Patient not taking: Reported on 12/28/2018)     No facility-administered medications prior to visit.    Allergies  Allergen Reactions  . Penicillins     ROS Review of Systems  Constitutional: Positive for fatigue.  HENT: Negative.   Eyes: Negative.  Negative for visual disturbance.  Respiratory: Negative.   Cardiovascular: Negative.   Gastrointestinal: Negative.   Endocrine: Positive for polydipsia and polyuria. Negative for polyphagia.  Genitourinary: Negative for dysuria and frequency.  Musculoskeletal: Negative.   Skin: Positive for  rash.  Allergic/Immunologic: Negative.   Neurological: Negative.   Hematological: Negative.   Psychiatric/Behavioral: Positive for dysphoric mood and sleep disturbance. Negative for self-injury and suicidal ideas. The patient is nervous/anxious.       Objective:    Physical Exam  Constitutional: Christine Rocha is oriented to person, place, and time. Christine Rocha appears well-developed and well-nourished.  HENT:  Head: Normocephalic.  Right Ear: External ear normal.  Left Ear: External ear normal.  Eyes: Pupils are equal, round, and reactive to light. Conjunctivae and EOM are normal.  Cardiovascular: Normal rate and regular rhythm.  Pulmonary/Chest: Effort normal and breath sounds normal.  Abdominal: Soft. Bowel sounds are normal.  Musculoskeletal:  General: Normal range of motion.     Cervical back: Normal range of motion and neck supple.  Neurological: Christine Rocha is alert and oriented to person, place, and time. Christine Rocha has normal reflexes.  Skin: Skin is warm. Rash noted.     Psychiatric: Christine Rocha has a normal mood and affect. Her behavior is normal. Judgment and thought content normal.  Vitals reviewed.   BP (!) 176/97 (BP Location: Right Arm, Patient Position: Sitting)   Pulse 97   Temp 98 F (36.7 C)   Resp 18   Ht '5\' 2"'  (1.575 m)   Wt 203 lb (92.1 kg)   SpO2 100%   BMI 37.13 kg/m  Wt Readings from Last 3 Encounters:  03/27/19 203 lb (92.1 kg)  09/28/18 205 lb (93 kg)  02/16/18 196 lb (88.9 kg)     Health Maintenance Due  Topic Date Due  . Hepatitis C Screening  Never done  . FOOT EXAM  Never done  . OPHTHALMOLOGY EXAM  Never done  . PAP SMEAR-Modifier  12/05/2015    There are no preventive care reminders to display for this patient.  Lab Results  Component Value Date   TSH 0.843 02/16/2018   Lab Results  Component Value Date   WBC 4.8 09/28/2018   HGB 13.7 09/28/2018   HCT 41.6 09/28/2018   MCV 87 09/28/2018   PLT 291 09/28/2018   Lab Results  Component Value Date   NA  141 12/28/2018   K 3.9 12/28/2018   CO2 23 12/28/2018   GLUCOSE 176 (H) 12/28/2018   BUN 5 (L) 12/28/2018   CREATININE 0.79 12/28/2018   BILITOT <0.2 12/28/2018   ALKPHOS 99 12/28/2018   AST 22 12/28/2018   ALT 35 (H) 12/28/2018   PROT 7.1 12/28/2018   ALBUMIN 4.6 12/28/2018   CALCIUM 9.5 12/28/2018   ANIONGAP 9 04/28/2017   Lab Results  Component Value Date   CHOL 137 09/28/2018   Lab Results  Component Value Date   HDL 35 (L) 09/28/2018   Lab Results  Component Value Date   LDLCALC 82 09/28/2018   Lab Results  Component Value Date   TRIG 107 09/28/2018   Lab Results  Component Value Date   CHOLHDL 3.9 09/28/2018   Lab Results  Component Value Date   HGBA1C 9.6 (A) 03/27/2019      Assessment & Plan:   Problem List Items Addressed This Visit      Cardiovascular and Mediastinum   Essential hypertension     Endocrine   Diabetes ([place]) - Primary   Relevant Orders   POCT glucose (manual entry) (Completed)   POCT glucose (manual entry) (Completed)    Other Visit Diagnoses    Uncontrolled type 2 diabetes mellitus with hyperglycemia (HCC)       Relevant Medications   insulin aspart (novoLOG) injection 8 Units (Completed)   Other Relevant Orders   HgB A1c (Completed)   Hyperglycemia       Relevant Medications   insulin aspart (novoLOG) injection 8 Units (Completed)   Noncompliance with medications       Current severe episode of major depressive disorder without psychotic features, unspecified whether recurrent (HCC)       Relevant Medications   sertraline (ZOLOFT) 50 MG tablet   Yeast infection of the skin         1. Type 2 diabetes mellitus with hyperglycemia, without long-term current use of insulin (HCC)  - POCT glucose (manual entry) - POCT glucose (manual  entry)  2. Uncontrolled type 2 diabetes mellitus with hyperglycemia ([place]) Patient's last office visit was virtual, A1c completed today. - HgB A1c  3. Hyperglycemia  - insulin aspart  (novoLOG) injection 8 Units  4. Noncompliance with medications Gave patient education on importance of compliance to her medications, patient stated Christine Rocha is going to ask her husband for help in being compliant to her medications.  5. Current severe episode of major depressive disorder without psychotic features, unspecified whether recurrent [facility]) Patient agreeable to start Zoloft 50 mg, declines referral for outpatient counseling.  6. Essential hypertension Patient encouraged to resume blood pressure medications, check blood pressure at home, denies any htn sxs, red flags given for prompt evaluation at ED   7. Yeast infection of the skin Patient refuses prescription for nystatin, wants to use her insurance benefits to purchase over-the-counter.  Encouraged patient to keep area dry and clean.  Meds ordered this encounter  Medications  . insulin aspart (novoLOG) injection 8 Units  . sertraline (ZOLOFT) 50 MG tablet    Sig: Take 1 tablet (50 mg total) by mouth daily.    Dispense:  30 tablet    Refill:  3    Order Specific Question:   Supervising Provider    Answer:   Elsie Stain [[id number]]    Follow-up: Return in about 4 weeks (around 04/24/2019) for Follow up with Dr. Chapman Fitch.    Loraine Grip Elizabeht Suto, PA-C

## 2019-03-27 NOTE — Progress Notes (Signed)
c /o BL under breast rash with itchy sensation x1wk  Pt stated not taking her meds for the past 3 wks / no reason for doing this  Dealing with anxiety / depression no suicidal thoughts   CBG 298

## 2019-03-27 NOTE — Patient Instructions (Addendum)
Please resume your medications. You will start Zoloft to help you with depression and anxiety.  Please purchase nystatin cream / powder ([employer] or A&D) over the counter and apply to rash. Keep rash areas clean and dry.  Continue to use cream until rash has resolved.   Major Depressive Disorder, Adult Major depressive disorder (MDD) is a mental health condition. MDD often makes you feel sad, hopeless, or helpless. MDD can also cause symptoms in your body. MDD can affect your:  Work.  School.  Relationships.  Other normal activities. MDD can range from mild to very bad. It may occur once (single episode MDD). It can also occur many times (recurrent MDD). The main symptoms of MDD often include:  Feeling sad, depressed, or irritable most of the time.  Loss of interest. MDD symptoms also include:  Sleeping too much or too little.  Eating too much or too little.  A change in your weight.  Feeling tired (fatigue) or having low energy.  Feeling worthless.  Feeling guilty.  Trouble making decisions.  Trouble thinking clearly.  Thoughts of suicide or harming others.  Feeling weak.  Feeling agitated.  Keeping yourself from being around other people (isolation). Follow these instructions at home: Activity  Do these things as told by your doctor: ? Go back to your normal activities. ? Exercise regularly. ? Spend time outdoors. Alcohol  Talk with your doctor about how alcohol can affect your antidepressant medicines.  Do not drink alcohol. Or, limit how much alcohol you drink. ? This means no more than 1 drink a day for nonpregnant women and 2 drinks a day for men. One drink equals one of these:  12 oz of beer.  5 oz of wine.  1 oz of hard liquor. General instructions  Take over-the-counter and prescription medicines only as told by your doctor.  Eat a healthy diet.  Get plenty of sleep.  Find activities that you enjoy. Make time to do them.  Think  about joining a support group. Your doctor may be able to suggest a group for you.  Keep all follow-up visits as told by your doctor. This is important. Where to find more information:  [employer] on Mental Illness: ? www.nami.[place]: ? https://carter.com/  National Suicide Prevention Lifeline: ? 218 218 6882. This is free, 24-hour help. Contact a doctor if:  Your symptoms get worse.  You have new symptoms. Get help right away if:  You self-harm.  You see, hear, taste, smell, or feel things that are not present (hallucinate). If you ever feel like you may hurt yourself or others, or have thoughts about taking your own life, get help right away. You can go to your nearest emergency department or call:  Your local emergency services (911 in the [place]  A suicide crisis helpline, such as the National Suicide Prevention Lifeline: ? (667) 117-9743. This is open 24 hours a day. This information is not intended to replace advice given to you by your health care provider. Make sure you discuss any questions you have with your health care provider. Document Revised: 12/10/2016 Document Reviewed: 09/14/2015 Elsevier Patient Education  2020 [employer].

## 2019-04-27 ENCOUNTER — Ambulatory Visit: Payer: BC Managed Care – PPO | Admitting: Family Medicine

## 2019-04-27 MED FILL — glipiZIDE XL 5 MG TB24: 5 | 30 days supply | Qty: 30 | Fill #0

## 2019-04-27 MED FILL — MICROLET LANCETS MISC: 50 days supply | Qty: 100 | Fill #0

## 2019-04-27 MED FILL — metFORMIN HCL 1000 MG TABS: 1000 | 30 days supply | Qty: 30 | Fill #0

## 2019-04-27 MED FILL — CONTOUR NEXT STRIPS: 50 days supply | Qty: 100 | Fill #0

## 2019-04-27 MED FILL — SERTRALINE HCL 50 MG TABLET: 50 | 30 days supply | Qty: 30 | Fill #0

## 2019-07-04 ENCOUNTER — Other Ambulatory Visit: Payer: Self-pay | Admitting: Physician Assistant

## 2019-07-04 ENCOUNTER — Other Ambulatory Visit: Payer: Self-pay | Admitting: Family Medicine

## 2019-07-04 DIAGNOSIS — E1165 Type 2 diabetes mellitus with hyperglycemia: Secondary | ICD-10-CM

## 2019-07-31 IMAGING — DX DG HIP (WITH OR WITHOUT PELVIS) 2-3V*L*
4 series · 4 of 4 positions shown · non-contrast
Comparison: None.

CLINICAL DATA: Status post fall, LEFT-sided leg pain.

EXAM:
DG HIP (WITH OR WITHOUT PELVIS) 2-3V LEFT

[pelvis ap]
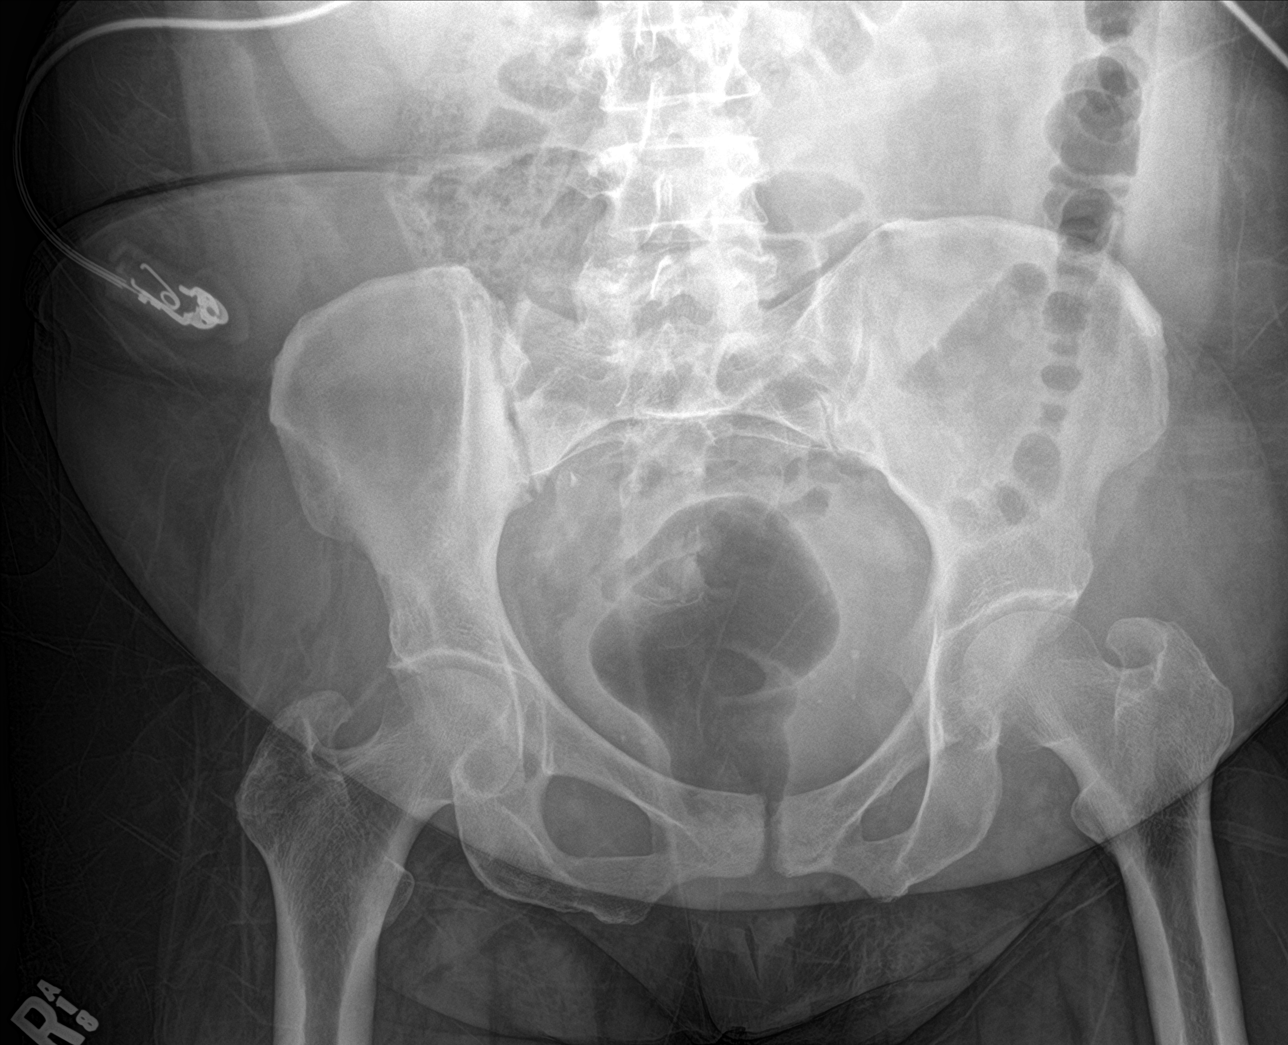

[hip ap]
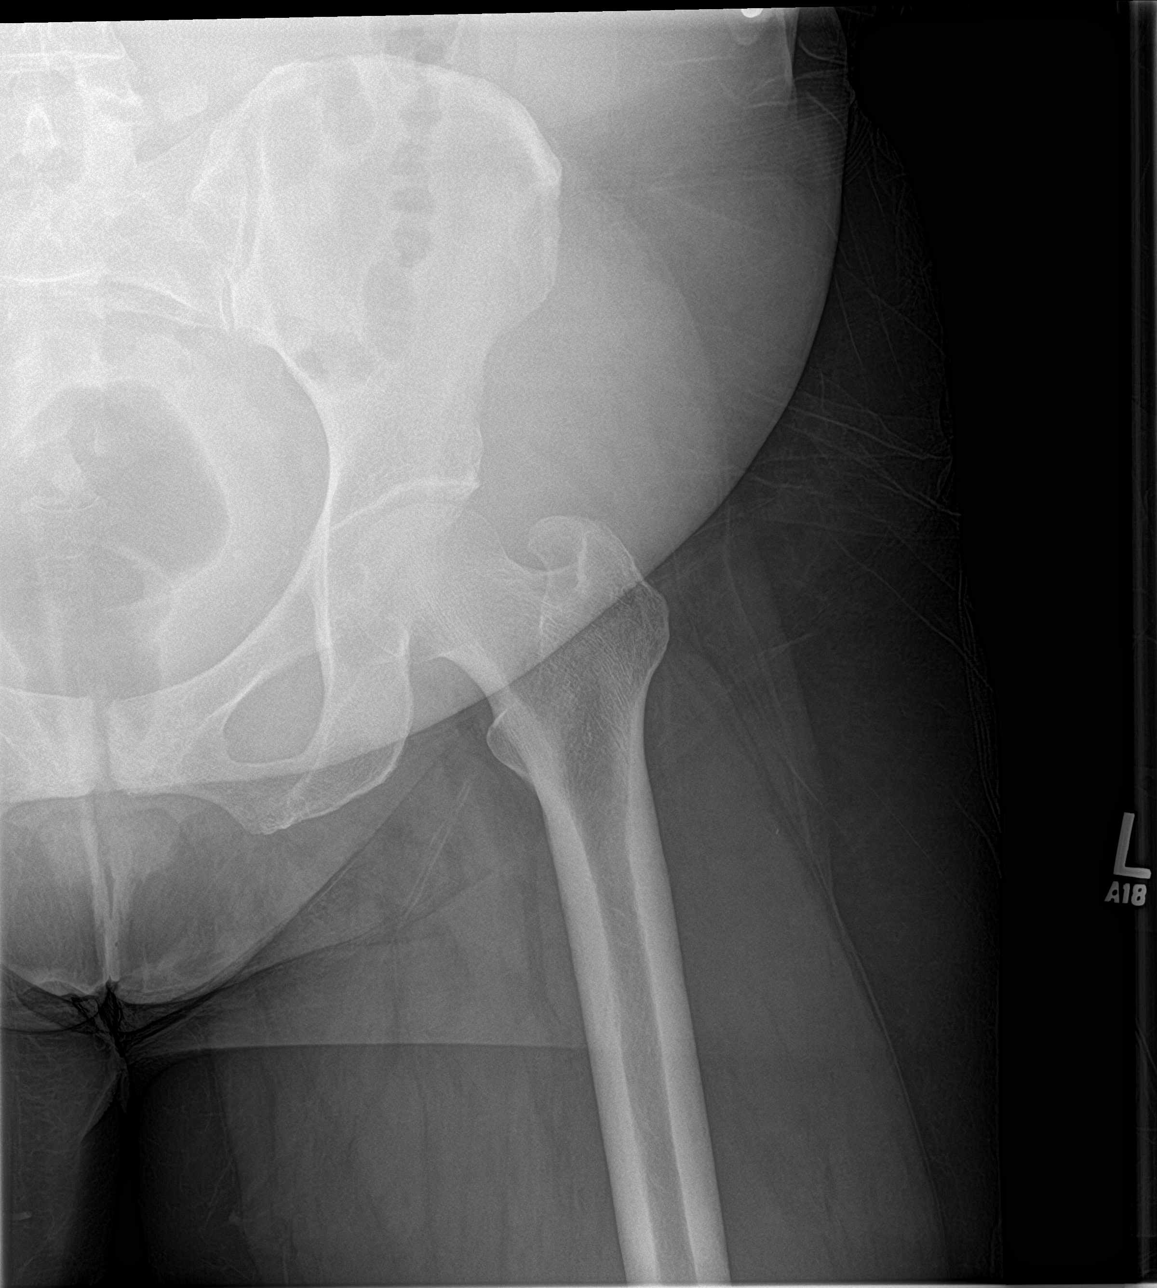

[hip x-table (1 of 2)]
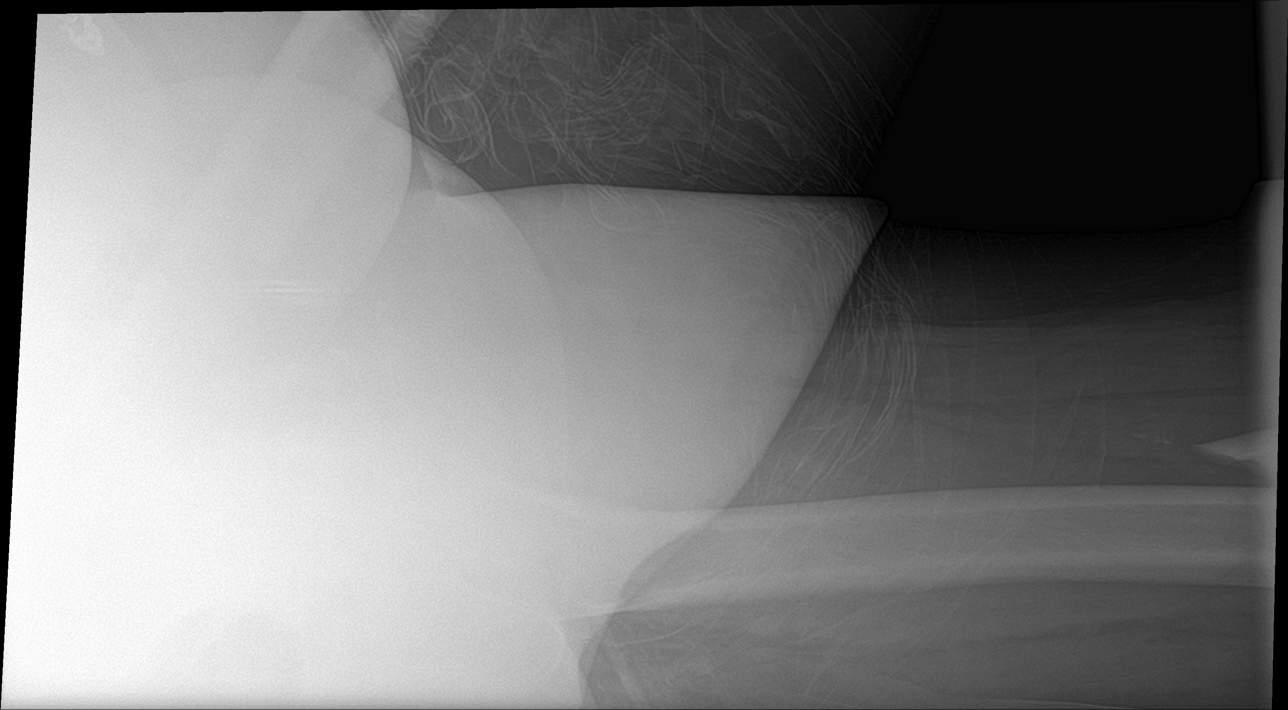

[hip x-table (2 of 2)]
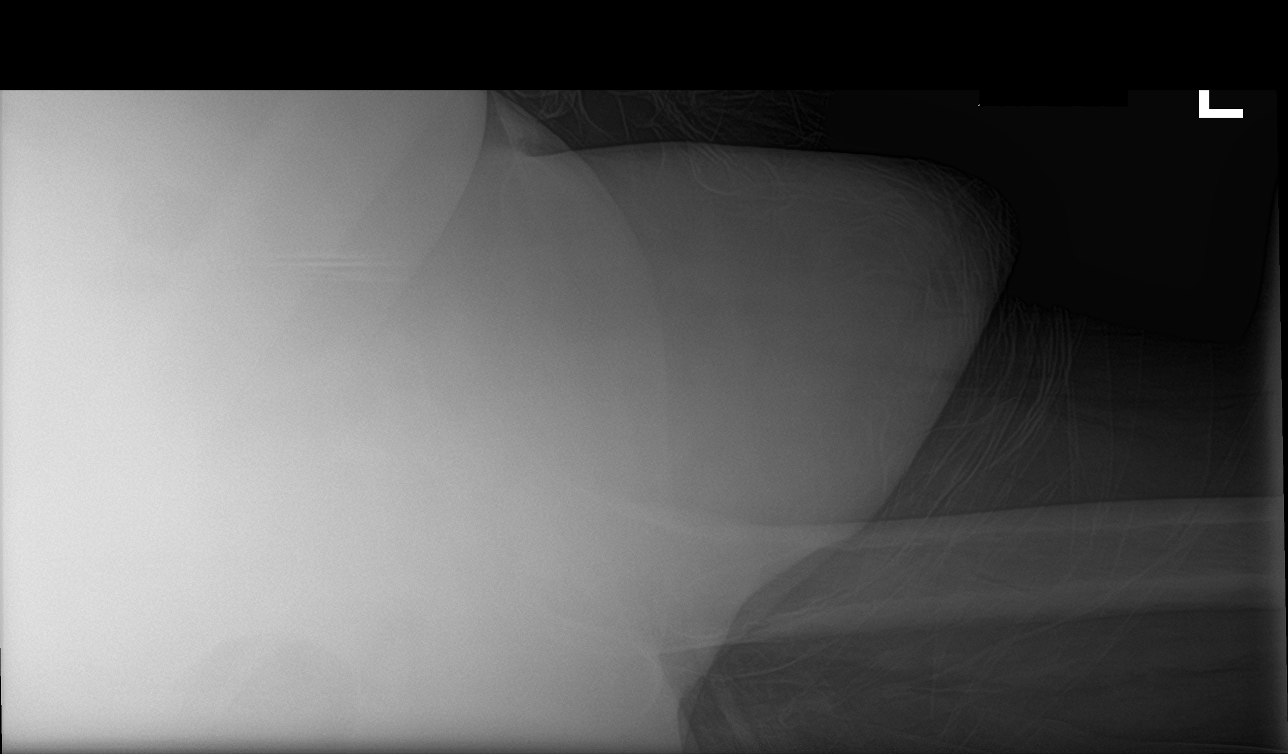

[4 of 4 positions shown; findings below may reference images not displayed]

FINDINGS: Single view of the pelvis and three views of the LEFT hip are
provided. Osseous alignment is normal. Bone mineralization is
normal. No fracture line or displaced fracture fragment seen. Soft
tissues about the pelvis and LEFT hip are unremarkable.
IMPRESSION: Negative.

## 2019-08-04 IMAGING — DX DG HAND COMPLETE 3+V*L*
3 series · 3 of 3 positions shown · non-contrast
Comparison: None.

CLINICAL DATA: Proximal second metacarpal pain after fall onto
hand.

EXAM:
LEFT HAND - COMPLETE 3+ VIEW

[x hand pa left]
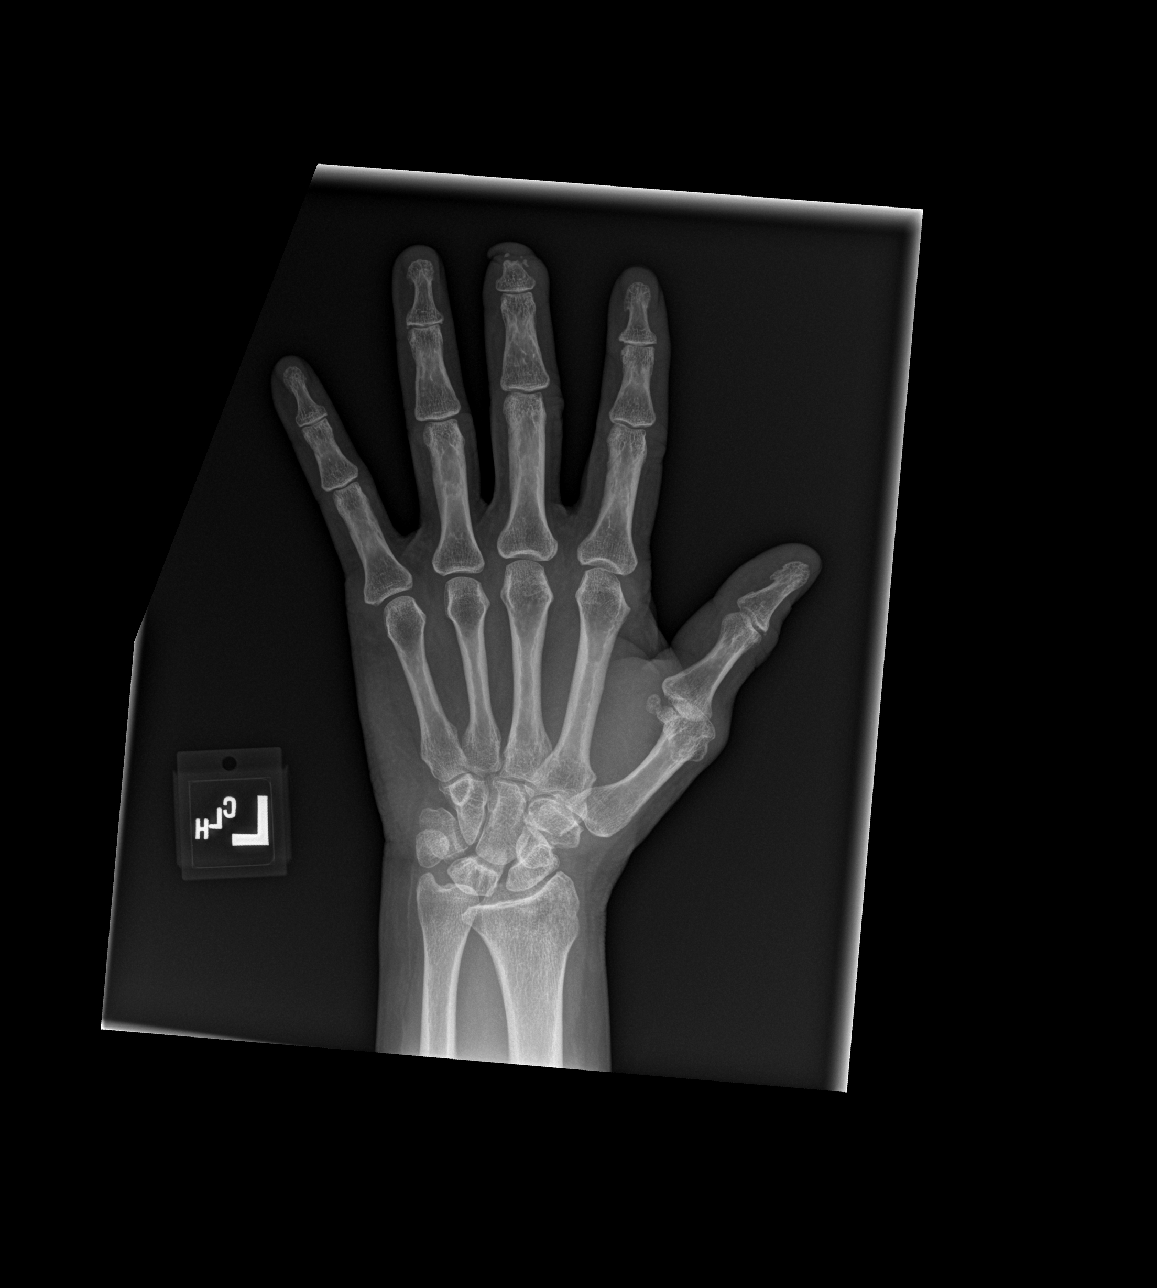

[x hand obl left]
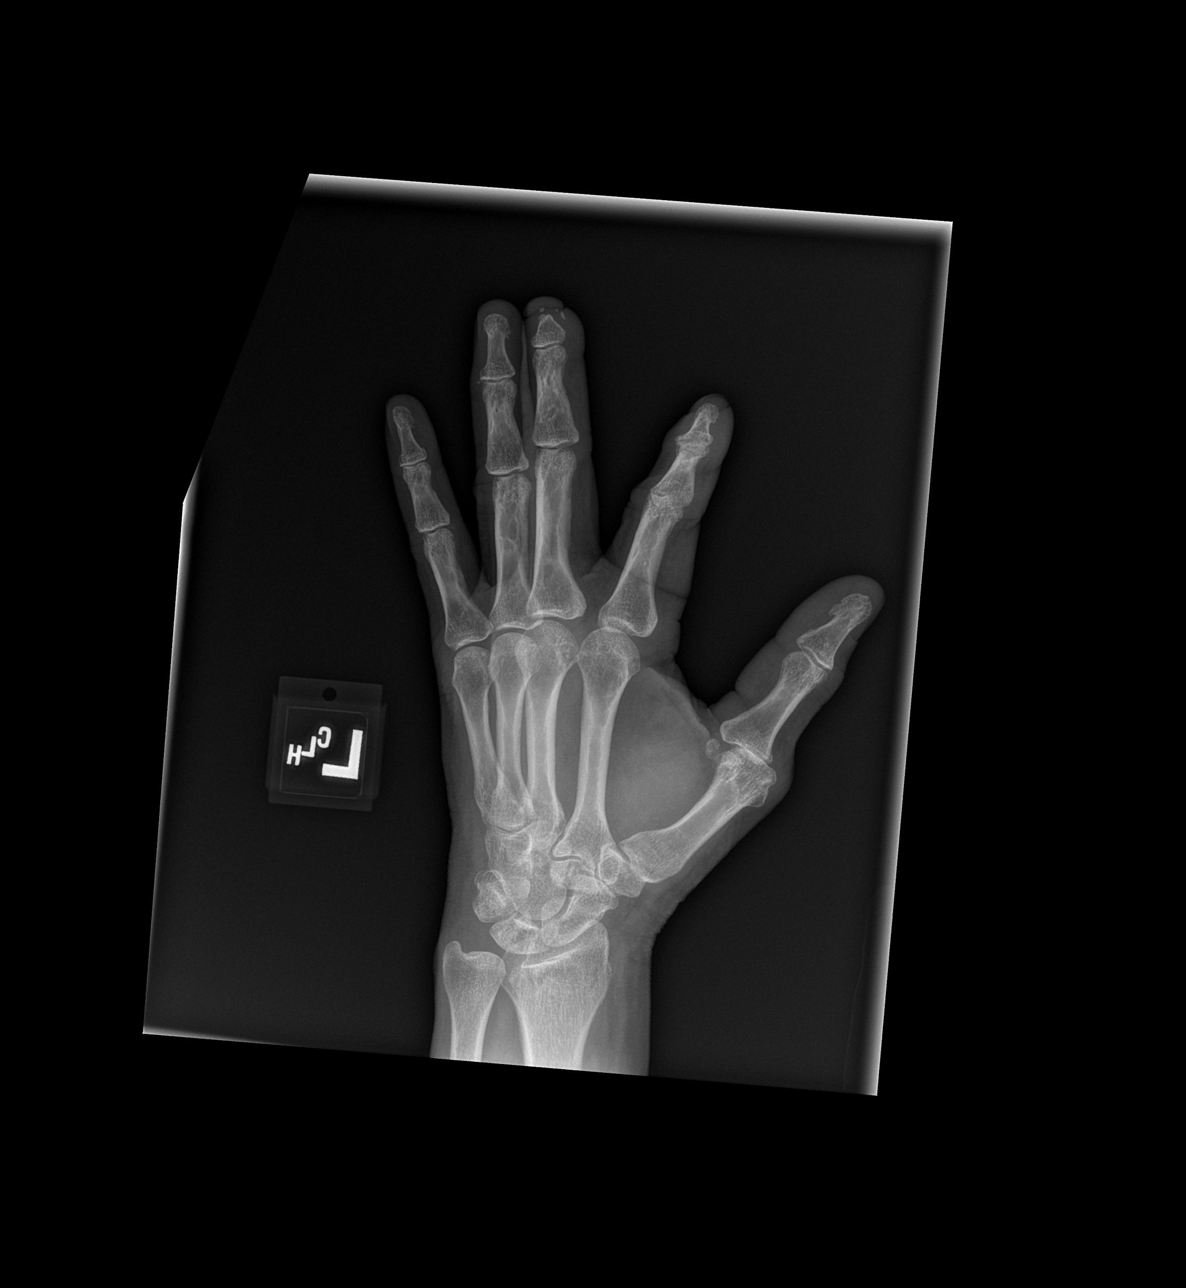

[x hand lat left]
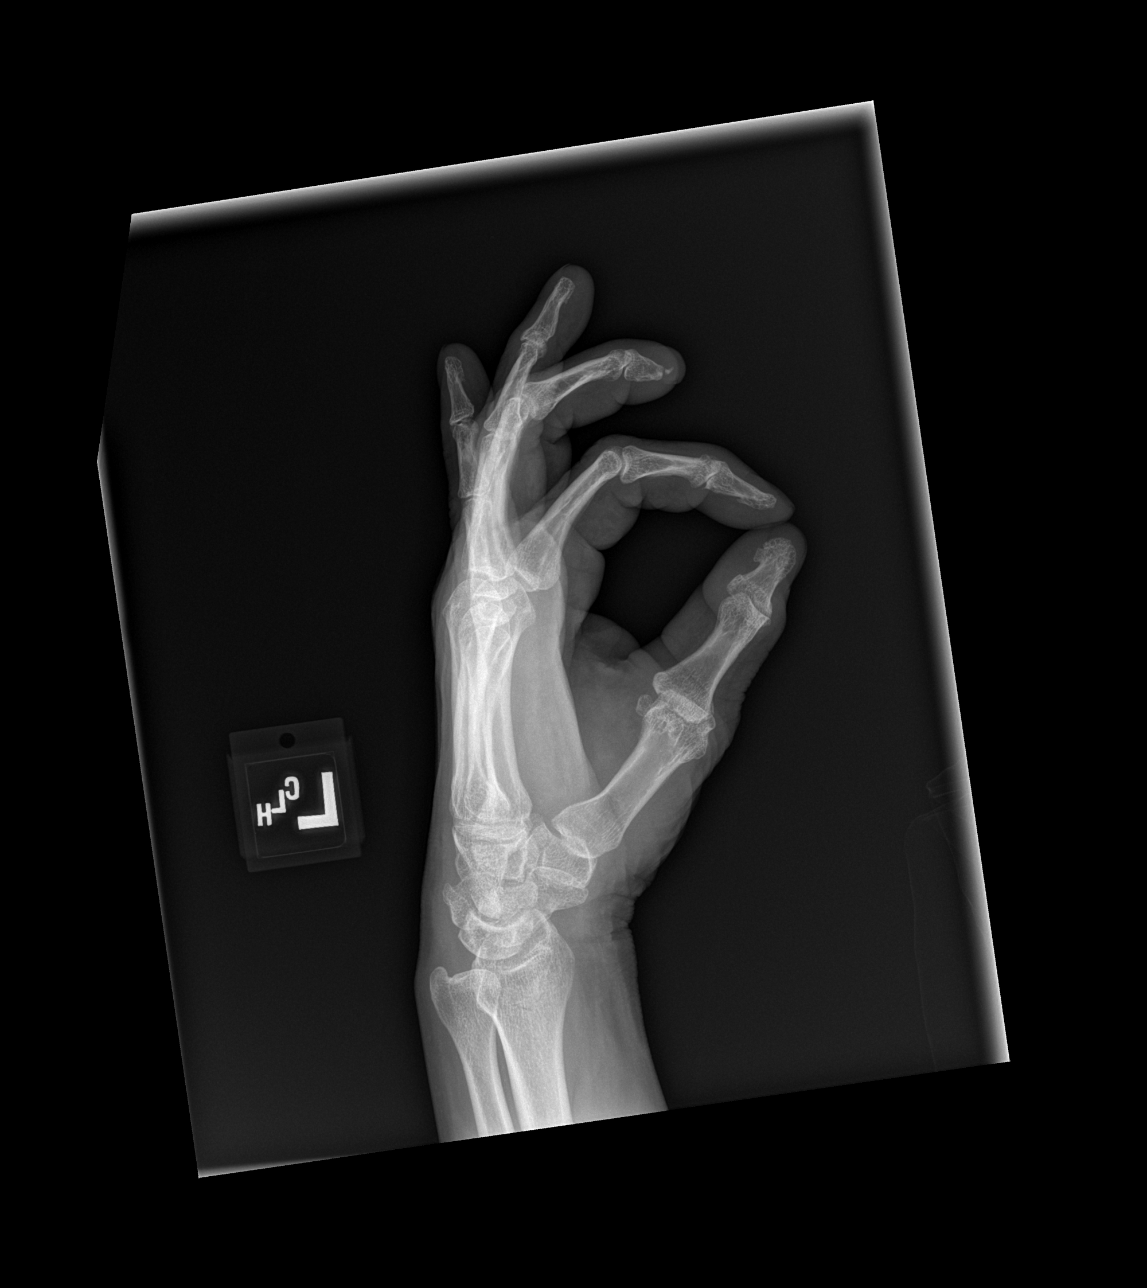

[3 of 3 positions shown; findings below may reference images not displayed]

FINDINGS: Acute, nondisplaced intra-articular fracture involving the distal
left radius undermining the styloid of the radius. Fracture extends
into the radiocarpal joint at the level of the proximal scaphoid.
Degenerative cystic change noted of the scaphoid and lunate. No
acute carpal bone fracture. Remote amputation involving the left
third distal phalanx at midshaft with at least 3 tiny ossific
densities within the soft tissues of the stump. Osteoarthritis of
the first MCP with joint space narrowing and moderate volar
subluxation of the proximal phalanx relative to the head of the
first metacarpal.
IMPRESSION: 1. Acute, nondisplaced intra-articular fracture of the distal radius
extending into the radiocarpal joint.
2. Remote third distal phalangeal amputation at midshaft.
3. Degenerative joint space narrowing and subluxation of the first
MCP.
4. Degenerative cystic change of the scaphoid and lunate.

## 2019-09-11 ENCOUNTER — Other Ambulatory Visit (HOSPITAL_COMMUNITY)
Admission: RE | Admit: 2019-09-11 | Discharge: 2019-09-11 | Disposition: A | Discharge status: Home / Self Care | Payer: BC Managed Care – PPO | Source: Ambulatory Visit | Attending: Family | Admitting: Family

## 2019-09-11 ENCOUNTER — Encounter: Payer: Self-pay | Admitting: Family

## 2019-09-11 ENCOUNTER — Ambulatory Visit: Payer: BC Managed Care – PPO | Attending: Family | Admitting: Family

## 2019-09-11 ENCOUNTER — Other Ambulatory Visit: Payer: Self-pay

## 2019-09-11 VITALS — BP 171/137 | HR 79 | Temp 98.7°F | Resp 16 | Wt 191.8 lb

## 2019-09-11 DIAGNOSIS — A599 Trichomoniasis, unspecified: Secondary | ICD-10-CM | POA: Diagnosis not present

## 2019-09-11 DIAGNOSIS — Z124 Encounter for screening for malignant neoplasm of cervix: Secondary | ICD-10-CM | POA: Insufficient documentation

## 2019-09-11 DIAGNOSIS — I10 Essential (primary) hypertension: Secondary | ICD-10-CM

## 2019-09-11 NOTE — Patient Instructions (Signed)
PAP today. Follow-up with primary physician in 1 week for management of chronic conditions.  Pap Test Why am I having this test? A Pap test, also called a Pap smear, is a screening test to check for signs of:  Cancer of the vagina, cervix, and uterus. The cervix is the lower part of the uterus that opens into the vagina.  Infection.  Changes that may be a sign that cancer is developing (precancerous changes). Women need this test on a regular basis. In general, you should have a Pap test every 3 years until you reach menopause or age 39. Women aged 30-60 may choose to have their Pap test done at the same time as an HPV (human papillomavirus) test every 5 years (instead of every 3 years). Your health care provider may recommend having Pap tests more or less often depending on your medical conditions and past Pap test results. What kind of sample is taken?  Your health care provider will collect a sample of cells from the surface of your cervix. This will be done using a small cotton swab, plastic spatula, or brush. This sample is often collected during a pelvic exam, when you are lying on your back on an exam table with feet in footrests (stirrups). In some cases, fluids (secretions) from the cervix or vagina may also be collected. How do I prepare for this test?  Be aware of where you are in your menstrual cycle. If you are menstruating on the day of the test, you may be asked to reschedule.  You may need to reschedule if you have a known vaginal infection on the day of the test.  Follow instructions from your health care provider about: ? Changing or stopping your regular medicines. Some medicines can cause abnormal test results, such as digitalis and tetracycline. ? Avoiding douching or taking a bath the day before or the day of the test. Tell a health care provider about:  Any allergies you have.  All medicines you are taking, including vitamins, herbs, eye drops, creams, and  over-the-counter medicines.  Any blood disorders you have.  Any surgeries you have had.  Any medical conditions you have.  Whether you are pregnant or may be pregnant. How are the results reported? Your test results will be reported as either abnormal or normal. A false-positive result can occur. A false positive is incorrect because it means that a condition is present when it is not. A false-negative result can occur. A false negative is incorrect because it means that a condition is not present when it is. What do the results mean? A normal test result means that you do not have signs of cancer of the vagina, cervix, or uterus. An abnormal result may mean that you have:  Cancer. A Pap test by itself is not enough to diagnose cancer. You will have more tests done in this case.  Precancerous changes in your vagina, cervix, or uterus.  Inflammation of the cervix.  An STD (sexually transmitted disease).  A fungal infection.  A parasite infection. Talk with your health care provider about what your results mean. Questions to ask your health care provider Ask your health care provider, or the department that is doing the test:  When will my results be ready?  How will I get my results?  What are my treatment options?  What other tests do I need?  What are my next steps? Summary  In general, women should have a Pap test every 3 years  until they reach menopause or age 72.  Your health care provider will collect a sample of cells from the surface of your cervix. This will be done using a small cotton swab, plastic spatula, or brush.  In some cases, fluids (secretions) from the cervix or vagina may also be collected. This information is not intended to replace advice given to you by your health care provider. Make sure you discuss any questions you have with your health care provider. Document Revised: 09/06/2016 Document Reviewed: 09/06/2016 Elsevier Patient Education   [place].

## 2019-09-11 NOTE — Progress Notes (Signed)
Patient ID: Christine Rocha, female    DOB: 1955-04-08  MRN: [id number]  CC: PAP SMEAR  Subjective: Christine Rocha is a 64 y.o. female with history of uncontrolled hypertension, diabetes, left femur fracture, and smoking addiction who presents for PAP smear.  1. PAP SMEAR: Age at menarche: 64 years old  Age at menopause: 64 years old  Pregnancies: 3 Births: 0 Abortions: 0 Miscarriages: 3 Sexually active: denies Vaginal discharge: yes, yellow, thin, not sure of odor  Vaginal lesions: denies Pelvic pain: denies Uterine fibroids: denies   Last PAP: 12/04/2012 Results of last PAP: normal PAP and negative HPV Testing for HIV today? Declined  Colonoscopy: last 04/19/2017 Mammogram: last 11/29/2018   Patient Active Problem List   Diagnosis Date Noted  . Femur fracture, left ([place]) 04/23/2017  . Preventative health care 10/28/2014  . Encounter for screening mammogram for breast cancer 10/28/2014  . Primary osteoarthritis of left knee 10/28/2014  . Essential hypertension 11/15/2013  . Diabetes ([place]) 11/15/2013  . Uncontrolled hypertension 10/11/2012  . Smoking addiction 10/11/2012     Current Outpatient Medications on File Prior to Visit  Medication Sig Dispense Refill  . acetaminophen (TYLENOL) 325 MG tablet Take 1-2 tablets (325-650 mg total) by mouth every 6 (six) hours as needed for mild pain (pain score 1-3 or temp > 100.5). (Patient not taking: Reported on 03/27/2019)    . amLODipine (NORVASC) 10 MG tablet Take 1 tablet (10 mg total) by mouth daily. (Patient not taking: Reported on 03/27/2019) 90 tablet 1  . atenolol (TENORMIN) 100 MG tablet Take 1 tablet (100 mg total) by mouth daily. (Patient not taking: Reported on 03/27/2019) 90 tablet 1  . Blood Glucose Monitoring Suppl (CONTOUR MONITOR) w/Device KIT 1 each by Does not apply route 2 (two) times daily. (Patient not taking: Reported on 03/27/2019) 1 kit 0  . carboxymethylcellulose (REFRESH PLUS) 0.5 % SOLN Place 1 drop into both  eyes daily as needed (dry eyes).    . CONTOUR NEXT TEST test strip USE TO TEST BLOOD SUGAR TWICE DAILY 100 strip 1  . glipiZIDE (GLUCOTROL XL) 5 MG 24 hr tablet TAKE 1 TABLET BY MOUTH EVERY DAY WITH BREAKFAST OR BEFORE THE FIRST MEAL OF THE DAY TO LOWER BLOOD SUGAR (Patient not taking: Reported on 03/27/2019) 90 tablet 0  . magnesium citrate SOLN Take 296 mLs (1 Bottle total) by mouth once as needed for severe constipation. (Patient not taking: Reported on 03/27/2019) 195 mL   . metFORMIN (GLUCOPHAGE) 1000 MG tablet Take 0.5 tablets (500 mg total) by mouth 2 (two) times daily with a meal. To help control blood sugars (Patient not taking: Reported on 03/27/2019) 180 tablet 1  . Microlet Lancets MISC USE TWICE DAILY 100 each 11  . Multiple Vitamins-Minerals (WOMENS 50+ MULTI VITAMIN/MIN) TABS Take 1 tablet by mouth daily.    . polyethylene glycol (MIRALAX / GLYCOLAX) packet Take 17 g by mouth daily. (Patient not taking: Reported on 12/28/2018) 14 each 0  . senna-docusate (SENOKOT-S) 8.6-50 MG tablet Take 1 tablet by mouth 2 (two) times daily. (Patient not taking: Reported on 12/28/2018)    . sertraline (ZOLOFT) 50 MG tablet Take 1 tablet (50 mg total) by mouth daily. 30 tablet 3   No current facility-administered medications on file prior to visit.    Allergies  Allergen Reactions  . Penicillins     Social History   Socioeconomic History  . Marital status: Married    Spouse name: Not on file  . Number of children:  Not on file  . Years of education: Not on file  . Highest education level: Not on file  Occupational History  . Not on file  Tobacco Use  . Smoking status: Current Every Day Smoker    Packs/day: 0.25    Types: Cigarettes  . Smokeless tobacco: Never Used  Vaping Use  . Vaping Use: Never used  Substance and Sexual Activity  . Alcohol use: No  . Drug use: No  . Sexual activity: Never    Birth control/protection: None  Other Topics Concern  . Not on file  Social History  Narrative  . Not on file   Social Determinants of Health   Financial Resource Strain:   . Difficulty of Paying Living Expenses: Not on file  Food Insecurity:   . Worried About Charity fundraiser in the Last Year: Not on file  . Ran Out of Food in the Last Year: Not on file  Transportation Needs:   . Lack of Transportation (Medical): Not on file  . Lack of Transportation (Non-Medical): Not on file  Physical Activity:   . Days of Exercise per Week: Not on file  . Minutes of Exercise per Session: Not on file  Stress:   . Feeling of Stress : Not on file  Social Connections:   . Frequency of Communication with Friends and Family: Not on file  . Frequency of Social Gatherings with Friends and Family: Not on file  . Attends Religious Services: Not on file  . Active Member of Clubs or Organizations: Not on file  . Attends Archivist Meetings: Not on file  . Marital Status: Not on file  Intimate Partner Violence:   . Fear of Current or Ex-Partner: Not on file  . Emotionally Abused: Not on file  . Physically Abused: Not on file  . Sexually Abused: Not on file    Family History  Problem Relation Age of Onset  . Hypertension Mother   . Hypertension Sister   . Diabetes Sister     Past Surgical History:  Procedure Laterality Date  . ORIF FEMUR FRACTURE Left 04/24/2017   Procedure: OPEN REDUCTION INTERNAL FIXATION (ORIF) DISTAL FEMUR FRACTURE;  Surgeon: [name], MD;  Location: [facility];  Service: Orthopedics;  Laterality: Left;    ROS: Review of Systems Negative except as stated above  PHYSICAL EXAM: Vitals with BMI 09/11/2019 03/27/2019 09/28/2018  Height - 5' 2" 5' 3"  Weight 191 lbs 13 oz 203 lbs 205 lbs  BMI - 93.81 82.99  Systolic 371 696 789  Diastolic 381 97 98  Pulse 79 97 86    Wt Readings from Last 3 Encounters:  09/11/19 191 lb 12.8 oz (87 kg)  03/27/19 203 lb (92.1 kg)  09/28/18 205 lb (93 kg)     Physical Exam General appearance - alert,  well appearing, and in no distress and oriented to person, place, and time Mental status - alert, oriented to person, place, and time, normal mood, behavior, speech, dress, motor activity, and thought processes Neck - supple, no significant adenopathy Lymphatics - no palpable lymphadenopathy, no hepatosplenomegaly Chest - clear to auscultation, no wheezes, rales or rhonchi, symmetric air entry, no tachypnea, retractions or cyanosis Heart - normal rate, regular rhythm, normal S1, S2, no murmurs, rubs, clicks or gallops Breasts - patient declines to have breast exam Pelvic - normal external genitalia, vulva, vagina, cervix, uterus and adnexa, VULVA: normal appearing vulva with no masses, tenderness or lesions, VAGINA: normal appearing  vagina with normal color and discharge, no lesions, CERVIX: normal appearing cervix without discharge or lesions, UTERUS: uterus is normal size, shape, consistency and nontender, ADNEXA: normal adnexa in size, nontender and no masses, PAP: Pap smear done today, DNA probe for chlamydia and GC obtained, HPV test, exam chaperoned by Orlan Leavens, CMA  ASSESSMENT AND PLAN: 1. Cervical cancer screening: - Cytology PAP for cervical cancer screening.  - Cervicovaginal for sexually transmitted infections screening.  - Cytology - PAP([place]) - Cervicovaginal ancillary only  2. Essential hypertension: - Patient's blood pressure elevated during today's visit. Patient asymptomatic without chest pressure, chest pain, palpitations, and shortness of breath. Reports she has not taken blood pressure medication in 1 month and plans to follow-up with primary physician within the next week. - Patient was given clear instructions to go to Emergency Department or return to medical center if symptoms don't improve, worsen, or new problems develop.The patient verbalized understanding. - Follow-up with primary physician as scheduled.   Patient was given the opportunity to ask  questions.  Patient verbalized understanding of the plan and was able to repeat key elements of the plan. Patient was given clear instructions to go to Emergency Department or return to medical center if symptoms don't improve, worsen, or new problems develop.The patient verbalized understanding.  Camillia Herter, NP

## 2019-09-12 LAB — CERVICOVAGINAL ANCILLARY ONLY
Bacterial Vaginitis (gardnerella): NEGATIVE
Candida Glabrata: NEGATIVE
Candida Vaginitis: NEGATIVE
Chlamydia: NEGATIVE
Comment: NEGATIVE
Comment: NEGATIVE
Comment: NEGATIVE
Comment: NEGATIVE
Comment: NEGATIVE
Comment: NORMAL
Neisseria Gonorrhea: NEGATIVE
Trichomonas: POSITIVE — AB

## 2019-09-12 MED ORDER — METRONIDAZOLE 500 MG PO TABS: 2000.0000 mg | ORAL_TABLET | Freq: Once | ORAL | 0 refills | Status: AC

## 2019-09-12 NOTE — Progress Notes (Signed)
Please call patient with update.   Patient has Trichomonas infection. Metronidazole prescribed and sent to pharmacy on file.   Both patient and her partner need to be treated for Trichomonas. Also, both need to complete treatment prior to having unprotected sex again. Counseled to not cosume alcohol while taking prescribed antibotic.   Negative for gonorrhea, chlamydia, bacterial vaginitis, and yeast.   Follow-up with primary physician as needed.

## 2019-09-12 NOTE — Addendum Note (Signed)
Addended by: Camillia Herter on: 09/12/2019 03:22 PM   Modules accepted: Orders

## 2019-09-13 ENCOUNTER — Telehealth: Payer: Self-pay

## 2019-09-13 LAB — CYTOLOGY - PAP
Adequacy: ABSENT
Comment: NEGATIVE
Diagnosis: NEGATIVE
High risk HPV: NEGATIVE

## 2019-09-13 NOTE — Progress Notes (Signed)
Please call patient with update.  PAP normal. HPV negative.  The transformation zone is absent. The transformation zone is the most common place for cancer cells to develop.   However, in accordance to the [place] for Colposcopy and Cervical Pathology (ASCCP) guidelines patient may keep regular PAP schedule of every 3 to 5 years.   Follow-up with primary physician as needed.

## 2019-09-13 NOTE — Telephone Encounter (Signed)
Contacted pt to go over pap results pt didn't answer lvm asking pt to give a call back at their earliest convenience

## 2019-09-19 ENCOUNTER — Ambulatory Visit: Payer: BC Managed Care – PPO | Attending: Family Medicine | Admitting: Family Medicine

## 2019-09-19 ENCOUNTER — Other Ambulatory Visit: Payer: Self-pay

## 2019-09-19 ENCOUNTER — Encounter: Payer: Self-pay | Admitting: Family Medicine

## 2019-09-19 VITALS — BP 158/94 | HR 84 | Temp 98.1°F | Resp 16 | Ht 62.0 in | Wt 193.0 lb

## 2019-09-19 DIAGNOSIS — A599 Trichomoniasis, unspecified: Secondary | ICD-10-CM

## 2019-09-19 DIAGNOSIS — E1141 Type 2 diabetes mellitus with diabetic mononeuropathy: Secondary | ICD-10-CM | POA: Diagnosis not present

## 2019-09-19 DIAGNOSIS — K5900 Constipation, unspecified: Secondary | ICD-10-CM | POA: Diagnosis not present

## 2019-09-19 DIAGNOSIS — Z79899 Other long term (current) drug therapy: Secondary | ICD-10-CM | POA: Diagnosis not present

## 2019-09-19 DIAGNOSIS — E1165 Type 2 diabetes mellitus with hyperglycemia: Secondary | ICD-10-CM

## 2019-09-19 DIAGNOSIS — I10 Essential (primary) hypertension: Secondary | ICD-10-CM | POA: Diagnosis not present

## 2019-09-19 DIAGNOSIS — F419 Anxiety disorder, unspecified: Secondary | ICD-10-CM

## 2019-09-19 MED ORDER — METRONIDAZOLE 500 MG PO TABS: 500.0000 mg | ORAL_TABLET | Freq: Two times a day (BID) | ORAL | 0 refills | Status: DC

## 2019-09-19 MED ORDER — METFORMIN HCL 1000 MG PO TABS: 500.0000 mg | ORAL_TABLET | Freq: Two times a day (BID) | ORAL | 1 refills | Status: DC

## 2019-09-19 MED ORDER — SERTRALINE HCL 50 MG PO TABS: 50.0000 mg | ORAL_TABLET | Freq: Every day | ORAL | 3 refills | Status: DC

## 2019-09-19 MED ORDER — POLYETHYLENE GLYCOL 3350 17 G PO PACK: 17.0000 g | PACK | Freq: Every day | ORAL | 6 refills | Status: DC | PRN

## 2019-09-19 MED ORDER — GLIPIZIDE ER 5 MG PO TB24: ORAL_TABLET | ORAL | 1 refills | Status: DC

## 2019-09-19 MED ORDER — CONTOUR NEXT TEST VI STRP: ORAL_STRIP | 1 refills | Status: DC

## 2019-09-19 NOTE — Patient Instructions (Signed)
Hemorrhoids Hemorrhoids are swollen veins that may develop:  In the butt (rectum). These are called internal hemorrhoids.  Around the opening of the butt (anus). These are called external hemorrhoids. Hemorrhoids can cause pain, itching, or bleeding. Most of the time, they do not cause serious problems. They usually get better with diet changes, lifestyle changes, and other home treatments. What are the causes? This condition may be caused by:  Having trouble pooping (constipation).  Pushing hard (straining) to poop.  Watery poop (diarrhea).  Pregnancy.  Being very overweight (obese).  Sitting for long periods of time.  Heavy lifting or other activity that causes you to strain.  Anal sex.  Riding a bike for a long period of time. What are the signs or symptoms? Symptoms of this condition include:  Pain.  Itching or soreness in the butt.  Bleeding from the butt.  Leaking poop.  Swelling in the area.  One or more lumps around the opening of your butt. How is this diagnosed? A doctor can often diagnose this condition by looking at the affected area. The doctor may also:  Do an exam that involves feeling the area with a gloved hand (digital rectal exam).  Examine the area inside your butt using a small tube (anoscope).  Order blood tests. This may be done if you have lost a lot of blood.  Have you get a test that involves looking inside the colon using a flexible tube with a camera on the end (sigmoidoscopy or colonoscopy). How is this treated? This condition can usually be treated at home. Your doctor may tell you to change what you eat, make lifestyle changes, or try home treatments. If these do not help, procedures can be done to remove the hemorrhoids or make them smaller. These may involve:  Placing rubber bands at the base of the hemorrhoids to cut off their blood supply.  Injecting medicine into the hemorrhoids to shrink them.  Shining a type of light  energy onto the hemorrhoids to cause them to fall off.  Doing surgery to remove the hemorrhoids or cut off their blood supply. Follow these instructions at home: Eating and drinking   Eat foods that have a lot of fiber in them. These include whole grains, beans, nuts, fruits, and vegetables.  Ask your doctor about taking products that have added fiber (fibersupplements).  Reduce the amount of fat in your diet. You can do this by: ? Eating low-fat dairy products. ? Eating less red meat. ? Avoiding processed foods.  Drink enough fluid to keep your pee (urine) pale yellow. Managing pain and swelling   Take a warm-water bath (sitz bath) for 20 minutes to ease pain. Do this 3-4 times a day. You may do this in a bathtub or using a portable sitz bath that fits over the toilet.  If told, put ice on the painful area. It may be helpful to use ice between your warm baths. ? Put ice in a plastic bag. ? Place a towel between your skin and the bag. ? Leave the ice on for 20 minutes, 2-3 times a day. General instructions  Take over-the-counter and prescription medicines only as told by your doctor. ? Medicated creams and medicines may be used as told.  Exercise often. Ask your doctor how much and what kind of exercise is best for you.  Go to the bathroom when you have the urge to poop. Do not wait.  Avoid pushing too hard when you poop.  Keep your   butt dry and clean. Use wet toilet paper or moist towelettes after pooping.  Do not sit on the toilet for a long time.  Keep all follow-up visits as told by your doctor. This is important. Contact a doctor if you:  Have pain and swelling that do not get better with treatment or medicine.  Have trouble pooping.  Cannot poop.  Have pain or swelling outside the area of the hemorrhoids. Get help right away if you have:  Bleeding that will not stop. Summary  Hemorrhoids are swollen veins in the butt or around the opening of the  butt.  They can cause pain, itching, or bleeding.  Eat foods that have a lot of fiber in them. These include whole grains, beans, nuts, fruits, and vegetables.  Take a warm-water bath (sitz bath) for 20 minutes to ease pain. Do this 3-4 times a day. This information is not intended to replace advice given to you by your health care provider. Make sure you discuss any questions you have with your health care provider. Document Revised: 01/05/2018 Document Reviewed: 05/19/2017 Elsevier Patient Education  [place].  Constipation, Adult Constipation is when a person:  Poops (has a bowel movement) fewer times in a week than normal.  Has a hard time pooping.  Has poop that is dry, hard, or bigger than normal. Follow these instructions at home: Eating and drinking   Eat foods that have a lot of fiber, such as: ? Fresh fruits and vegetables. ? Whole grains. ? Beans.  Eat less of foods that are high in fat, low in fiber, or overly processed, such as: ? [place] fries. ? Hamburgers. ? Cookies. ? Candy. ? Soda.  Drink enough fluid to keep your pee (urine) clear or pale yellow. General instructions  Exercise regularly or as told by your doctor.  Go to the restroom when you feel like you need to poop. Do not hold it in.  Take over-the-counter and prescription medicines only as told by your doctor. These include any fiber supplements.  Do pelvic floor retraining exercises, such as: ? Doing deep breathing while relaxing your lower belly (abdomen). ? Relaxing your pelvic floor while pooping.  Watch your condition for any changes.  Keep all follow-up visits as told by your doctor. This is important. Contact a doctor if:  You have pain that gets worse.  You have a fever.  You have not pooped for 4 days.  You throw up (vomit).  You are not hungry.  You lose weight.  You are bleeding from the anus.  You have thin, pencil-like poop (stool). Get help right away  if:  You have a fever, and your symptoms suddenly get worse.  You leak poop or have blood in your poop.  Your belly feels hard or bigger than normal (is bloated).  You have very bad belly pain.  You feel dizzy or you faint. This information is not intended to replace advice given to you by your health care provider. Make sure you discuss any questions you have with your health care provider. Document Revised: 12/10/2016 Document Reviewed: 06/18/2015 Elsevier Patient Education  2020 [employer].

## 2019-09-19 NOTE — Progress Notes (Signed)
Here for HTN f /u

## 2019-09-19 NOTE — Progress Notes (Signed)
Established Patient Office Visit  Subjective:  Patient ID: Christine Rocha, female    DOB: 27-Jul-1955  Age: 64 y.o. MRN: [id number]  CC: f/u DM/HTN and need for refills  HPI Christine Rocha, 64 yo female who is seen in follow-up of chronic medical issues including DM, anxiety and HTN. She reports that she was seen 09/11/19 for pap by another provider and reported at that time that she was out of all of her medications but she was not given any refills. She was also contacted after her last visit that she had an abnormal test result revealing an infection and that her husband also needed to be treated. She cannot recall what the infection was or what medicine she needs to take. Her husband has cancer and she wants to make sure that he can take the medication.         She does feel as if she has had some mild occasional increased thirst but no urinary frequency or increased thirst related to diabetes.  She needs refills of all medications at today's visit.  She has had some occasional sensation of numbness/tingling in her feet but not on a daily basis.  She denies any blurred vision.  She denies headaches or dizziness related to her blood pressure.  She denies any current issues with increased anxiety.  She denies any recent chest pain or palpitations.  She has noticed issues with constipation and she recently had to strain to have a bowel movement and noticed blood with wiping x1.  She believes that her colonoscopy is currently up-to-date.  She has had no other blood in the stools and no black stools.  No abdominal pain or nausea.  She has had no falls in the past 12 months.          Past Medical History:  Diagnosis Date  . Hypertension   . Seizures ([place])     Past Surgical History:  Procedure Laterality Date  . ORIF FEMUR FRACTURE Left 04/24/2017   Procedure: OPEN REDUCTION INTERNAL FIXATION (ORIF) DISTAL FEMUR FRACTURE;  Surgeon: [name], MD;  Location: [facility];  Service: Orthopedics;   Laterality: Left;    Family History  Problem Relation Age of Onset  . Hypertension Mother   . Hypertension Sister   . Diabetes Sister     Social History   Socioeconomic History  . Marital status: Married    Spouse name: Not on file  . Number of children: Not on file  . Years of education: Not on file  . Highest education level: Not on file  Occupational History  . Not on file  Tobacco Use  . Smoking status: Current Every Day Smoker    Packs/day: 0.25    Types: Cigarettes  . Smokeless tobacco: Never Used  Vaping Use  . Vaping Use: Never used  Substance and Sexual Activity  . Alcohol use: No  . Drug use: No  . Sexual activity: Never    Birth control/protection: None  Other Topics Concern  . Not on file  Social History Narrative  . Not on file   Social Determinants of Health   Financial Resource Strain:   . Difficulty of Paying Living Expenses: Not on file  Food Insecurity:   . Worried About Charity fundraiser in the Last Year: Not on file  . Ran Out of Food in the Last Year: Not on file  Transportation Needs:   . Lack of Transportation (Medical): Not on file  .  Lack of Transportation (Non-Medical): Not on file  Physical Activity:   . Days of Exercise per Week: Not on file  . Minutes of Exercise per Session: Not on file  Stress:   . Feeling of Stress : Not on file  Social Connections:   . Frequency of Communication with Friends and Family: Not on file  . Frequency of Social Gatherings with Friends and Family: Not on file  . Attends Religious Services: Not on file  . Active Member of Clubs or Organizations: Not on file  . Attends Archivist Meetings: Not on file  . Marital Status: Not on file  Intimate Partner Violence:   . Fear of Current or Ex-Partner: Not on file  . Emotionally Abused: Not on file  . Physically Abused: Not on file  . Sexually Abused: Not on file    Outpatient Medications Prior to Visit  Medication Sig Dispense Refill  .  acetaminophen (TYLENOL) 325 MG tablet Take 1-2 tablets (325-650 mg total) by mouth every 6 (six) hours as needed for mild pain (pain score 1-3 or temp > 100.5). (Patient not taking: Reported on 03/27/2019)    . amLODipine (NORVASC) 10 MG tablet Take 1 tablet (10 mg total) by mouth daily. (Patient not taking: Reported on 03/27/2019) 90 tablet 1  . atenolol (TENORMIN) 100 MG tablet Take 1 tablet (100 mg total) by mouth daily. (Patient not taking: Reported on 03/27/2019) 90 tablet 1  . Blood Glucose Monitoring Suppl (CONTOUR MONITOR) w/Device KIT 1 each by Does not apply route 2 (two) times daily. (Patient not taking: Reported on 03/27/2019) 1 kit 0  . carboxymethylcellulose (REFRESH PLUS) 0.5 % SOLN Place 1 drop into both eyes daily as needed (dry eyes).    . CONTOUR NEXT TEST test strip USE TO TEST BLOOD SUGAR TWICE DAILY 100 strip 1  . glipiZIDE (GLUCOTROL XL) 5 MG 24 hr tablet TAKE 1 TABLET BY MOUTH EVERY DAY WITH BREAKFAST OR BEFORE THE FIRST MEAL OF THE DAY TO LOWER BLOOD SUGAR (Patient not taking: Reported on 03/27/2019) 90 tablet 0  . magnesium citrate SOLN Take 296 mLs (1 Bottle total) by mouth once as needed for severe constipation. (Patient not taking: Reported on 03/27/2019) 195 mL   . metFORMIN (GLUCOPHAGE) 1000 MG tablet Take 0.5 tablets (500 mg total) by mouth 2 (two) times daily with a meal. To help control blood sugars (Patient not taking: Reported on 03/27/2019) 180 tablet 1  . Microlet Lancets MISC USE TWICE DAILY 100 each 11  . Multiple Vitamins-Minerals (WOMENS 50+ MULTI VITAMIN/MIN) TABS Take 1 tablet by mouth daily.    . polyethylene glycol (MIRALAX / GLYCOLAX) packet Take 17 g by mouth daily. (Patient not taking: Reported on 12/28/2018) 14 each 0  . senna-docusate (SENOKOT-S) 8.6-50 MG tablet Take 1 tablet by mouth 2 (two) times daily. (Patient not taking: Reported on 12/28/2018)    . sertraline (ZOLOFT) 50 MG tablet Take 1 tablet (50 mg total) by mouth daily. 30 tablet 3   No  facility-administered medications prior to visit.    Allergies  Allergen Reactions  . Penicillins     ROS Review of Systems  Constitutional: Positive for fatigue (Occasional). Negative for chills and fever.  HENT: Negative for sore throat and trouble swallowing.   Eyes: Negative for photophobia and visual disturbance.  Respiratory: Negative for cough and shortness of breath.   Cardiovascular: Negative for chest pain, palpitations and leg swelling.  Gastrointestinal: Positive for constipation. Negative for abdominal pain, blood in  stool, diarrhea and nausea.       Blood with wiping after a bowel movement x1 when constipated  Endocrine: Positive for polydipsia (Occasional). Negative for polyphagia and polyuria.  Genitourinary: Negative for dysuria and frequency.  Musculoskeletal: Negative for arthralgias (Occasional) and back pain.  Skin: Negative for rash and wound.  Neurological: Negative for dizziness and headaches.  Hematological: Negative for adenopathy. Does not bruise/bleed easily.  Psychiatric/Behavioral: Negative for sleep disturbance. The patient is not nervous/anxious.       Objective:    Physical Exam  BP (!) 158/94   Pulse 84   Temp 98.1 F (36.7 C)   Resp 16   Ht '5\' 2"'  (1.575 m)   Wt 193 lb (87.5 kg)   SpO2 100%   BMI 35.30 kg/m  Wt Readings from Last 3 Encounters:  09/19/19 193 lb (87.5 kg)  09/11/19 191 lb 12.8 oz (87 kg)  03/27/19 203 lb (92.1 kg)     Health Maintenance Due  Topic Date Due  . Hepatitis C Screening  Never done  . FOOT EXAM  Never done  . OPHTHALMOLOGY EXAM  Never done  . COVID-19 Vaccine (1) Never done  . INFLUENZA VACCINE  08/12/2019   Covid and influenza immunizations discussed today's visit.  Diabetic foot exam was completed at today's visit.  She believes that her ophthalmology/diabetic eye exam is up-to-date.  Lab Results  Component Value Date   TSH 0.843 02/16/2018   Lab Results  Component Value Date   WBC 4.8  09/28/2018   HGB 13.7 09/28/2018   HCT 41.6 09/28/2018   MCV 87 09/28/2018   PLT 291 09/28/2018   Lab Results  Component Value Date   NA 141 12/28/2018   K 3.9 12/28/2018   CO2 23 12/28/2018   GLUCOSE 176 (H) 12/28/2018   BUN 5 (L) 12/28/2018   CREATININE 0.79 12/28/2018   BILITOT <0.2 12/28/2018   ALKPHOS 99 12/28/2018   AST 22 12/28/2018   ALT 35 (H) 12/28/2018   PROT 7.1 12/28/2018   ALBUMIN 4.6 12/28/2018   CALCIUM 9.5 12/28/2018   ANIONGAP 9 04/28/2017   Lab Results  Component Value Date   CHOL 137 09/28/2018   Lab Results  Component Value Date   HDL 35 (L) 09/28/2018   Lab Results  Component Value Date   LDLCALC 82 09/28/2018   Lab Results  Component Value Date   TRIG 107 09/28/2018   Lab Results  Component Value Date   CHOLHDL 3.9 09/28/2018   Lab Results  Component Value Date   HGBA1C 9.6 (A) 03/27/2019      Assessment & Plan:  1. Type 2 diabetes mellitus with hyperglycemia, without long-term current use of insulin ([place]); 3. Long term use of medication; 4. Peripheral Neuropathy associated with Type 2 DM Patient with abnormal monofilament exam at today's visit.  Diabetic foot care discussed and she will additionally be referred to podiatry as she also has thickening of the toenails and discoloration which may indicate onychomycosis.  She has been out of her medications for treatment of hypertension as well as lack of testing supplies.  Medications will be refilled at today's visit. She will have comprehensive metabolic panel, microalbumin, lipid panel and Hgb A1c. She will be contacted regarding the need for statin therapy which is recommended to help decrease the risk of CAD associated with DM.  - Microalbumin/Creatinine Ratio, Urine - Comprehensive metabolic panel - Hemoglobin A1c - Ambulatory referral to Podiatry - metFORMIN (GLUCOPHAGE) 1000 MG tablet;  Take 0.5 tablets (500 mg total) by mouth 2 (two) times daily with a meal. To help control blood  sugars  Dispense: 180 tablet; Refill: 1 - glipiZIDE (GLUCOTROL XL) 5 MG 24 hr tablet; TAKE 1 TABLET BY MOUTH EVERY DAY WITH BREAKFAST OR BEFORE THE FIRST MEAL OF THE DAY TO LOWER BLOOD SUGAR  Dispense: 90 tablet; Refill: 1 - Lipid panel - glucose blood (CONTOUR NEXT TEST) test strip; USE TO TEST BLOOD SUGAR TWICE DAILY  Dispense: 100 strip; Refill: 1  2. Essential hypertension Blood pressure is elevated at today's visit but she is currently out of medication. Refill sent to patient's pharmacy for refill of amlodipine. She will have urine microalbumin at today's visit and may need addition to an ACE or ARB as she is also diabetic.   4. Diabetic mononeuropathy associated with type 2 diabetes mellitus ([place]) Patient with abnormal monofilament exam and has noticed occasional sensation of numbness/tingling in her feet. Diabetic foot care discussed and patient referred to podiatry for further evaluation and abnormalities of the great toenails.   5. Trichomonas infection Cervicovaginal ancillary testing and pap results from patient's last visit reviewed and discussed with patient at today's visit. Encounter message was sent to her husband's PCP regarding the need for treatment.  (message was received later in the day from his PCP that husband will be contacted by his PCP regarding treatment). Treatment options and infection discussed with patient and RX sent to her pharmacy for oral metronidazole therapy per her request. - metroNIDAZOLE (FLAGYL) 500 MG tablet; Take 1 tablet (500 mg total) by mouth 2 (two) times daily. To treat infection  Dispense: 14 tablet; Refill: 0  6. Anxiety Stable and controlled on current medication and refill provided for Sertraline. - sertraline (ZOLOFT) 50 MG tablet; Take 1 tablet (50 mg total) by mouth daily.  Dispense: 30 tablet; Refill: 3  7. Constipation, unspecified constipation type RX provided for Miralax and she reports that her colonoscopy is up to date. On review of  chart, last colonoscopy was 04/19/2017. Per the report she had the removal of a 3 mm sessile polyp that was hyperplastic per pathology report. Will also check T4 and TSH to look for hypothyroidism as a possible contributing factor to her constipation.  Consider GI follow-up if no improvement in constipation or continued blood with wiping after a bowel movement. Educational material on constipation and hemorrhoids given as part of after visit summary. - polyethylene glycol (MIRALAX / GLYCOLAX) 17 g packet; Take 17 g by mouth daily as needed. Mixed into 16 or more ounces of fluid  Dispense: 30 each; Refill: 6 - T4 AND TSH   Follow-up: Return in about 4 months (around 01/19/2020) for DM/HTN; 4-6 week f/u with Lurena Joiner for BP recheck.    Antony Blackbird, MD

## 2019-09-20 LAB — LIPID PANEL
Chol/HDL Ratio: 5.1 ratio — ABNORMAL HIGH (ref 0.0–4.4)
Cholesterol, Total: 174 mg/dL (ref 100–199)
HDL: 34 mg/dL — ABNORMAL LOW
LDL Chol Calc (NIH): 102 mg/dL — ABNORMAL HIGH (ref 0–99)
Triglycerides: 222 mg/dL — ABNORMAL HIGH (ref 0–149)
VLDL Cholesterol Cal: 38 mg/dL (ref 5–40)

## 2019-09-20 LAB — COMPREHENSIVE METABOLIC PANEL WITH GFR
ALT: 22 IU/L (ref 0–32)
AST: 16 IU/L (ref 0–40)
Albumin/Globulin Ratio: 1.6 (ref 1.2–2.2)
Albumin: 4.6 g/dL (ref 3.8–4.8)
Alkaline Phosphatase: 104 IU/L (ref 48–121)
BUN/Creatinine Ratio: 7 — ABNORMAL LOW (ref 12–28)
BUN: 6 mg/dL — ABNORMAL LOW (ref 8–27)
Bilirubin Total: <0.2 mg/dL (ref 0.0–1.2)
CO2: 28 mmol/L (ref 20–29)
Calcium: 10 mg/dL (ref 8.7–10.3)
Chloride: 100 mmol/L (ref 96–106)
Creatinine, Ser: 0.82 mg/dL (ref 0.57–1.00)
GFR calc Af Amer: 89 mL/min/1.73
GFR calc non Af Amer: 77 mL/min/1.73
Globulin, Total: 2.9 g/dL (ref 1.5–4.5)
Glucose: 177 mg/dL — ABNORMAL HIGH (ref 65–99)
Potassium: 3.8 mmol/L (ref 3.5–5.2)
Sodium: 142 mmol/L (ref 134–144)
Total Protein: 7.5 g/dL (ref 6.0–8.5)

## 2019-09-20 LAB — T4 AND TSH
T4, Total: 8.9 ug/dL (ref 4.5–12.0)
TSH: 1.23 u[IU]/mL (ref 0.450–4.500)

## 2019-09-20 LAB — HEMOGLOBIN A1C
Est. average glucose Bld gHb Est-mCnc: 177 mg/dL
Hgb A1c MFr Bld: 7.8 % — ABNORMAL HIGH (ref 4.8–5.6)

## 2019-09-20 MED ORDER — AMLODIPINE BESYLATE 10 MG PO TABS: 10.0000 mg | ORAL_TABLET | Freq: Every day | ORAL | 1 refills | Status: DC

## 2019-09-21 ENCOUNTER — Telehealth: Payer: Self-pay | Admitting: Family Medicine

## 2019-09-21 NOTE — Telephone Encounter (Signed)
Pt called back in for assistance, pt says that she was suppose to receive  11 refills for her diabetic meter and  11 refills for her test strips, pt says that she didn't receive the correct amount of refills as usual.   Also,     Pt would like to make sure that it is okay for her to  take Centrum Silver 200 tablets? Pt says that she purchased the vitamins OTC and just want to make sure that they are a good choice?

## 2019-09-21 NOTE — Telephone Encounter (Signed)
Pt called, all questions regarding prescriptions and vitamins answered and pt verbalized understanding of all information given

## 2019-09-21 NOTE — Telephone Encounter (Signed)
Copied from [place] [id number]. Topic: General - Other >> Sep 21, 2019  1:14 PM [name] A wrote: Patient would like a callback from nurse in regards to her prescriptions refills that were sent to pharmacy and a vitamin that she was advised to purchase. Please advise

## 2019-09-21 NOTE — Telephone Encounter (Signed)
Can you reach out to patient in regards to medications.

## 2019-09-23 ENCOUNTER — Other Ambulatory Visit: Payer: Self-pay | Admitting: Family Medicine

## 2019-09-23 MED ORDER — ROSUVASTATIN CALCIUM 10 MG PO TABS: 10.0000 mg | ORAL_TABLET | Freq: Every day | ORAL | 3 refills | Status: DC

## 2019-10-08 ENCOUNTER — Other Ambulatory Visit: Payer: Self-pay

## 2019-10-08 ENCOUNTER — Ambulatory Visit (INDEPENDENT_AMBULATORY_CARE_PROVIDER_SITE_OTHER): Payer: BC Managed Care – PPO | Admitting: Podiatry

## 2019-10-08 DIAGNOSIS — L989 Disorder of the skin and subcutaneous tissue, unspecified: Secondary | ICD-10-CM | POA: Diagnosis not present

## 2019-10-08 DIAGNOSIS — E0843 Diabetes mellitus due to underlying condition with diabetic autonomic (poly)neuropathy: Secondary | ICD-10-CM | POA: Diagnosis not present

## 2019-10-08 DIAGNOSIS — M79676 Pain in unspecified toe(s): Secondary | ICD-10-CM | POA: Diagnosis not present

## 2019-10-08 DIAGNOSIS — B351 Tinea unguium: Secondary | ICD-10-CM

## 2019-10-08 NOTE — Progress Notes (Signed)
° °  SUBJECTIVE Patient with a history of diabetes mellitus presents to office today complaining of elongated, thickened nails that cause pain while ambulating in shoes.  She is unable to trim her own nails. Patient is here for further evaluation and treatment.   Past Medical History:  Diagnosis Date   Hypertension    Seizures ([place])     OBJECTIVE General Patient is awake, alert, and oriented x 3 and in no acute distress. Derm Skin is dry and supple bilateral. Negative open lesions or macerations. Remaining integument unremarkable. Nails are tender, long, thickened and dystrophic with subungual debris, consistent with onychomycosis, 1-5 bilateral. No signs of infection noted.  Hyperkeratotic preulcerative callus tissue also noted to the bilateral feet Vasc  DP and PT pedal pulses palpable bilaterally. Temperature gradient within normal limits.  Neuro Epicritic and protective threshold sensation diminished bilaterally.  Musculoskeletal Exam No symptomatic pedal deformities noted bilateral. Muscular strength within normal limits.  ASSESSMENT 1. Diabetes Mellitus w/ peripheral neuropathy 2. Onychomycosis of nail due to dermatophyte bilateral 3. Pain in foot bilateral 4.  Preulcerative callus lesions bilateral feet  PLAN OF CARE 1. Patient evaluated today. 2. Instructed to maintain good pedal hygiene and foot care. Stressed importance of controlling blood sugar.  3. Mechanical debridement of nails 1-5 bilaterally performed using a nail nipper. Filed with dremel without incident.  4.  Excisional debridement of the preulcerative callus lesions was performed using a tissue nipper without incident or bleeding  5.  Return to clinic in 3 mos.     Edrick Kins, DPM [facility]  Dr. Edrick Kins, [address]                                        [place], [place] [address]                Office [phone]  Fax [phone]

## 2019-10-29 ENCOUNTER — Other Ambulatory Visit: Payer: Self-pay | Admitting: Family Medicine

## 2019-10-29 DIAGNOSIS — Z1231 Encounter for screening mammogram for malignant neoplasm of breast: Secondary | ICD-10-CM

## 2019-11-30 ENCOUNTER — Ambulatory Visit
Admission: RE | Admit: 2019-11-30 | Discharge: 2019-11-30 | Disposition: A | Discharge status: Home / Self Care | Payer: Medicare Other | Source: Ambulatory Visit | Attending: Family Medicine | Admitting: Family Medicine

## 2019-11-30 ENCOUNTER — Other Ambulatory Visit: Payer: Self-pay

## 2019-11-30 DIAGNOSIS — Z1231 Encounter for screening mammogram for malignant neoplasm of breast: Secondary | ICD-10-CM | POA: Diagnosis not present

## 2019-12-03 ENCOUNTER — Ambulatory Visit: Payer: Medicare Other | Admitting: Orthopedic Surgery

## 2019-12-03 ENCOUNTER — Other Ambulatory Visit: Payer: Self-pay

## 2019-12-03 ENCOUNTER — Ambulatory Visit: Payer: Self-pay

## 2019-12-03 ENCOUNTER — Encounter: Payer: Self-pay | Admitting: Orthopedic Surgery

## 2019-12-03 VITALS — Ht 62.0 in | Wt 193.0 lb

## 2019-12-03 DIAGNOSIS — M25561 Pain in right knee: Secondary | ICD-10-CM

## 2019-12-03 DIAGNOSIS — D1722 Benign lipomatous neoplasm of skin and subcutaneous tissue of left arm: Secondary | ICD-10-CM

## 2019-12-03 MED ORDER — LIDOCAINE HCL (PF) 1 % IJ SOLN
5.0000 mL | INTRAMUSCULAR | Status: AC | PRN
  Administered 2019-12-03: 5 mL

## 2019-12-03 MED ORDER — METHYLPREDNISOLONE ACETATE 40 MG/ML IJ SUSP
40.0000 mg | INTRAMUSCULAR | Status: AC | PRN
  Administered 2019-12-03: 40 mg via INTRA_ARTICULAR

## 2019-12-03 NOTE — Progress Notes (Signed)
Office Visit Note   Patient: Christine Rocha           Date of Birth: 06-28-1955           MRN: [id number] Visit Date: 12/03/2019              Requested by: Antony Blackbird, MD [place],  [place] [address] PCP: Antony Blackbird, MD  Chief Complaint  Patient presents with  . Right Knee - Pain      HPI: Patient is a 64 year old woman who presents complaining of arthritic pain in the right knee she states that after prolonged ambulation she has to stop it is difficult to walk for long distances.  Patient also complains of a soft tissue mass in the posterior aspect of the left shoulder.  Assessment & Plan: Visit Diagnoses:  1. Acute pain of right knee   2. Lipoma of left shoulder     Plan: Right knee was injected reevaluate in 4 weeks.  Discussed that the lipoma is benign and we can follow this expectantly.  Follow-Up Instructions: No follow-ups on file.   Ortho Exam  Patient is alert, oriented, no adenopathy, well-dressed, normal affect, normal respiratory effort. Examination patient has an antalgic gait with valgus alignment to the right knee.  She is tender to palpation of the medial lateral joint line there is a knee effusion there is crepitation with range of motion collateral ligaments are stable.  Semination the left shoulder she has a small lipoma approximately a 3 cm in diameter there is no skin color changes no ulcers the mass is soft and mobile nontender.  Imaging: XR Knee 1-2 Views Right  Result Date: 12/03/2019 2 view radiographs of the right knee show stable internal fixation of the distal femur fracture on the left.  She has joint space narrowing of the medial joint line of the right knee with osteophytic bone spurs in all 3 compartments with subcondylar sclerosis.  No images are attached to the encounter.  Labs: Lab Results  Component Value Date   HGBA1C 7.8 (H) 09/19/2019   HGBA1C 9.6 (A) 03/27/2019   HGBA1C 9.6 (A) 09/28/2018     Lab Results   Component Value Date   ALBUMIN 4.6 09/19/2019   ALBUMIN 4.6 12/28/2018   ALBUMIN 4.6 09/28/2018    Lab Results  Component Value Date   MG 2.0 04/26/2017   MG 2.1 04/25/2017   MG 1.6 (L) 04/24/2017   Lab Results  Component Value Date   VD25OH 34 09/04/2012    No results found for: PREALBUMIN CBC EXTENDED Latest Ref Rng & Units 09/28/2018 02/16/2018 04/28/2017  WBC 3.4 - 10.8 x10E3/uL 4.8 4.6 6.9  RBC 3.77 - 5.28 x10E6/uL 4.81 5.39(H) 2.96(L)  HGB 11.1 - 15.9 g/dL 13.7 14.5 8.7(L)  HCT 34.0 - 46.6 % 41.6 45.4 26.6(L)  PLT 150 - 450 x10E3/uL 291 302 316  NEUTROABS 1.40 - 7.00 x10E3/uL 2.5 1.9 -  LYMPHSABS 0 - 3 x10E3/uL 1.9 2.3 -     Body mass index is 35.3 kg/m.  Orders:  Orders Placed This Encounter  Procedures  . XR Knee 1-2 Views Right   No orders of the defined types were placed in this encounter.    Procedures: Large Joint Inj: R knee on 12/03/2019 8:48 AM Indications: pain and diagnostic evaluation Details: 22 G 1.5 in needle, anteromedial approach  Arthrogram: No  Medications: 5 mL lidocaine (PF) 1 %; 40 mg methylPREDNISolone acetate 40 MG/ML Outcome: tolerated well,  no immediate complications Procedure, treatment alternatives, risks and benefits explained, specific risks discussed. Consent was given by the patient. Immediately prior to procedure a time out was called to verify the correct patient, procedure, equipment, support staff and site/side marked as required. Patient was prepped and draped in the usual sterile fashion.      Clinical Data: No additional findings.  ROS:  All other systems negative, except as noted in the HPI. Review of Systems  Objective: Vital Signs: Ht 5\' 2"  (1.575 m)   Wt 193 lb (87.5 kg)   BMI 35.30 kg/m   Specialty Comments:  No specialty comments available.  PMFS History: Patient Active Problem List   Diagnosis Date Noted  . Femur fracture, left ([place]) 04/23/2017  . Preventative health care 10/28/2014  .  Encounter for screening mammogram for breast cancer 10/28/2014  . Primary osteoarthritis of left knee 10/28/2014  . Essential hypertension 11/15/2013  . Diabetes ([place]) 11/15/2013  . Uncontrolled hypertension 10/11/2012  . Smoking addiction 10/11/2012   Past Medical History:  Diagnosis Date  . Hypertension   . Seizures ([place])     Family History  Problem Relation Age of Onset  . Hypertension Mother   . Breast cancer Mother   . Hypertension Sister   . Diabetes Sister     Past Surgical History:  Procedure Laterality Date  . ORIF FEMUR FRACTURE Left 04/24/2017   Procedure: OPEN REDUCTION INTERNAL FIXATION (ORIF) DISTAL FEMUR FRACTURE;  Surgeon: [name], MD;  Location: [facility];  Service: Orthopedics;  Laterality: Left;   Social History   Occupational History  . Not on file  Tobacco Use  . Smoking status: Current Every Day Smoker    Packs/day: 0.25    Types: Cigarettes  . Smokeless tobacco: Never Used  Vaping Use  . Vaping Use: Never used  Substance and Sexual Activity  . Alcohol use: No  . Drug use: No  . Sexual activity: Never    Birth control/protection: None

## 2019-12-25 ENCOUNTER — Other Ambulatory Visit: Payer: Self-pay | Admitting: Family Medicine

## 2019-12-25 DIAGNOSIS — E1165 Type 2 diabetes mellitus with hyperglycemia: Secondary | ICD-10-CM

## 2019-12-25 MED ORDER — CONTOUR NEXT TEST VI STRP: ORAL_STRIP | 2 refills | Status: DC

## 2019-12-25 NOTE — Telephone Encounter (Signed)
Medication Refill - Medication: glucose blood (CONTOUR NEXT TEST) test strip     Preferred Pharmacy (with phone number or street name):  [facility] [phone] - [place], [address] AT [address] Phone:  [phone]  Fax:  [phone]       Agent: Please be advised that RX refills may take up to 3 business days. We ask that you follow-up with your pharmacy.

## 2019-12-25 NOTE — Telephone Encounter (Signed)
Requested medication (s) are due for refill today:   Yes  Requested medication (s) are on the active medication list:   Yes  Future visit scheduled:   No  Former pt of Dr. Chapman Fitch   Last ordered: 09/19/2019 #100 strips, 1 refill  Returned because she hasn't been seen by another provider since Dr. Chapman Fitch left.   Requested Prescriptions  Pending Prescriptions Disp Refills   glucose blood (CONTOUR NEXT TEST) test strip 100 strip 1    Sig: USE TO TEST BLOOD SUGAR TWICE DAILY      Endocrinology: Diabetes - Testing Supplies Passed - 12/25/2019 10:30 AM      Passed - Valid encounter within last 12 months    Recent Outpatient Visits           3 months ago Type 2 diabetes mellitus with hyperglycemia, without long-term current use of insulin ([place])   [place], [place], MD   3 months ago Cervical cancer screening   [place], Amy J, NP   9 months ago Type 2 diabetes mellitus with hyperglycemia, without long-term current use of insulin ([place])   [place] Mayers, Cari S, [place]   12 months ago Essential hypertension   [place], MD   1 year ago Type 2 diabetes mellitus with hyperglycemia, without long-term current use of insulin [facility])   [place], Dionne Bucy, [place]       Future Appointments             In 6 days [name], MD [facility]

## 2019-12-31 ENCOUNTER — Ambulatory Visit (INDEPENDENT_AMBULATORY_CARE_PROVIDER_SITE_OTHER): Payer: Medicare Other | Admitting: Physician Assistant

## 2019-12-31 ENCOUNTER — Encounter: Payer: Self-pay | Admitting: Orthopedic Surgery

## 2019-12-31 ENCOUNTER — Ambulatory Visit: Payer: Self-pay

## 2019-12-31 VITALS — Ht 62.0 in | Wt 193.0 lb

## 2019-12-31 DIAGNOSIS — M545 Low back pain, unspecified: Secondary | ICD-10-CM | POA: Diagnosis not present

## 2019-12-31 NOTE — Progress Notes (Signed)
Office Visit Note   Patient: Christine Rocha           Date of Birth: 1955-07-04           MRN: [id number] Visit Date: 12/31/2019              Requested by: Antony Blackbird, MD No address on file PCP: Antony Blackbird, MD  Chief Complaint  Patient presents with  . Right Knee - Follow-up    S/p cortisone injection 12/03/19  . Lower Back - Pain      HPI: This is a pleasant 64 year old woman with a history of right knee arthritis.  At her last visit she was given an injection which helped her minimally.  She discussed with Dr. Sharol Given the possibility of a knee replacement.  She is a diabetic but does not use insulin.  She has a BMI of a little over 35.  She is also complaining of some lower back pain which hurts her when she bends over to tie her shoes.  Occasionally while she walks.  Denies any paresthesias or weakness  Assessment & Plan: Visit Diagnoses:  1. Acute midline low back pain without sciatica     Plan: Patient would like to go forward with knee replacement.  I discussed the risks including bleeding infection anesthesia complications blood clots and she would like to move forward with regards to her back is seems mostly muscular when she bends over.  Discussed with her proper posture as well as taking ibuprofen for mild pain  Follow-Up Instructions: No follow-ups on file.   Ortho Exam  Patient is alert, oriented, no adenopathy, well-dressed, normal affect, normal respiratory effort. Right knee no effusion no soft tissue swelling she has pain with range of motion especially on the medial side.  Compartments are soft and nontender.  Lower back she has no pain with forward bending extension or lateral bending she has good strength with plantar and dorsiflexion resisted.[name]  Heart regular rate and rhythm lungs clear  Imaging: No results found. No images are attached to the encounter.  Labs: Lab Results  Component Value Date   HGBA1C 7.8 (H) 09/19/2019   HGBA1C 9.6 (A) 03/27/2019    HGBA1C 9.6 (A) 09/28/2018     Lab Results  Component Value Date   ALBUMIN 4.6 09/19/2019   ALBUMIN 4.6 12/28/2018   ALBUMIN 4.6 09/28/2018    Lab Results  Component Value Date   MG 2.0 04/26/2017   MG 2.1 04/25/2017   MG 1.6 (L) 04/24/2017   Lab Results  Component Value Date   VD25OH 34 09/04/2012    No results found for: PREALBUMIN CBC EXTENDED Latest Ref Rng & Units 09/28/2018 02/16/2018 04/28/2017  WBC 3.4 - 10.8 x10E3/uL 4.8 4.6 6.9  RBC 3.77 - 5.28 x10E6/uL 4.81 5.39(H) 2.96(L)  HGB 11.1 - 15.9 g/dL 13.7 14.5 8.7(L)  HCT 34.0 - 46.6 % 41.6 45.4 26.6(L)  PLT 150 - 450 x10E3/uL 291 302 316  NEUTROABS 1.4 - 7.0 x10E3/uL 2.5 1.9 -  LYMPHSABS 0.7 - 3.1 x10E3/uL 1.9 2.3 -     Body mass index is 35.3 kg/m.  Orders:  Orders Placed This Encounter  Procedures  . XR Lumbar Spine 2-3 Views   No orders of the defined types were placed in this encounter.    Procedures: No procedures performed  Clinical Data: No additional findings.  ROS:  All other systems negative, except as noted in the HPI. Review of Systems  Objective: Vital Signs: Ht  5\' 2"  (1.575 m)   Wt 193 lb (87.5 kg)   BMI 35.30 kg/m   Specialty Comments:  No specialty comments available.  PMFS History: Patient Active Problem List   Diagnosis Date Noted  . Femur fracture, left ([place]) 04/23/2017  . Preventative health care 10/28/2014  . Encounter for screening mammogram for breast cancer 10/28/2014  . Primary osteoarthritis of left knee 10/28/2014  . Essential hypertension 11/15/2013  . Diabetes ([place]) 11/15/2013  . Uncontrolled hypertension 10/11/2012  . Smoking addiction 10/11/2012   Past Medical History:  Diagnosis Date  . Hypertension   . Seizures ([place])     Family History  Problem Relation Age of Onset  . Hypertension Mother   . Breast cancer Mother   . Hypertension Sister   . Diabetes Sister     Past Surgical History:  Procedure Laterality Date  . ORIF FEMUR FRACTURE Left  04/24/2017   Procedure: OPEN REDUCTION INTERNAL FIXATION (ORIF) DISTAL FEMUR FRACTURE;  Surgeon: [name], MD;  Location: [facility];  Service: Orthopedics;  Laterality: Left;   Social History   Occupational History  . Not on file  Tobacco Use  . Smoking status: Current Every Day Smoker    Packs/day: 0.25    Types: Cigarettes  . Smokeless tobacco: Never Used  Vaping Use  . Vaping Use: Never used  Substance and Sexual Activity  . Alcohol use: No  . Drug use: No  . Sexual activity: Never    Birth control/protection: None

## 2020-01-08 ENCOUNTER — Encounter: Payer: Self-pay | Admitting: Podiatry

## 2020-01-08 ENCOUNTER — Other Ambulatory Visit: Payer: Self-pay

## 2020-01-08 ENCOUNTER — Ambulatory Visit (INDEPENDENT_AMBULATORY_CARE_PROVIDER_SITE_OTHER): Payer: Medicare Other | Admitting: Podiatry

## 2020-01-08 DIAGNOSIS — E0843 Diabetes mellitus due to underlying condition with diabetic autonomic (poly)neuropathy: Secondary | ICD-10-CM

## 2020-01-08 DIAGNOSIS — M79676 Pain in unspecified toe(s): Secondary | ICD-10-CM

## 2020-01-08 DIAGNOSIS — L84 Corns and callosities: Secondary | ICD-10-CM | POA: Diagnosis not present

## 2020-01-08 DIAGNOSIS — B351 Tinea unguium: Secondary | ICD-10-CM

## 2020-01-08 MED ORDER — CICLOPIROX 8 % EX SOLN: CUTANEOUS | 11 refills | Status: DC

## 2020-01-08 NOTE — Patient Instructions (Addendum)
Use pumice stone on heels. Keep feet moisturized.  For extremely dry, cracked feet: moisturize feet once daily; do not apply between toes A. CeraVe Healing Ointment B. Aquaphor Healing Ointment C. Vaseline Petroleum Healing Jelly   If you have problems reaching your feet: apply to feet once daily; do not apply between toes A.  Aquaphor Advanced Therapy Ointment Body Spray B.  Vaseline Intensive Care Spray Lotion Advanced Repair   Onychomycosis/Fungal Toenails  WHAT IS IT? An infection that lies within the keratin of your nail plate that is caused by a fungus.  WHY ME? Fungal infections affect all ages, sexes, races, and creeds.  There may be many factors that predispose you to a fungal infection such as age, coexisting medical conditions such as diabetes, or an autoimmune disease; stress, medications, fatigue, genetics, etc.  Bottom line: fungus thrives in a warm, moist environment and your shoes offer such a location.  IS IT CONTAGIOUS? Theoretically, yes.  You do not want to share shoes, nail clippers or files with someone who has fungal toenails.  Walking around barefoot in the same room or sleeping in the same bed is unlikely to transfer the organism.  It is important to realize, however, that fungus can spread easily from one nail to the next on the same foot.  HOW DO WE TREAT THIS?  There are several ways to treat this condition.  Treatment may depend on many factors such as age, medications, pregnancy, liver and kidney conditions, etc.  It is best to ask your doctor which options are available to you.  1. No treatment.   Unlike many other medical concerns, you can live with this condition.  However for many people this can be a painful condition and may lead to ingrown toenails or a bacterial infection.  It is recommended that you keep the nails cut short to help reduce the amount of fungal nail. 2. Topical treatment.  These range from herbal remedies to prescription strength nail  lacquers.  About 40-50% effective, topicals require twice daily application for approximately 9 to 12 months or until an entirely new nail has grown out.  The most effective topicals are medical grade medications available through physicians offices. 3. Oral antifungal medications.  With an 80-90% cure rate, the most common oral medication requires 3 to 4 months of therapy and stays in your system for a year as the new nail grows out.  Oral antifungal medications do require blood work to make sure it is a safe drug for you.  A liver function panel will be performed prior to starting the medication and after the first month of treatment.  It is important to have the blood work performed to avoid any harmful side effects.  In general, this medication safe but blood work is required. 4. Laser Therapy.  This treatment is performed by applying a specialized laser to the affected nail plate.  This therapy is noninvasive, fast, and non-painful.  It is not covered by insurance and is therefore, out of pocket.  The results have been very good with a 80-95% cure rate.  The [facility] is the only practice in the area to offer this therapy. 5. Permanent Nail Avulsion.  Removing the entire nail so that a new nail will not grow back.   Diabetes Mellitus and Foot Care Foot care is an important part of your health, especially when you have diabetes. Diabetes may cause you to have problems because of poor blood flow (circulation) to your  feet and legs, which can cause your skin to:  Become thinner and drier.  Break more easily.  Heal more slowly.  Peel and crack. You may also have nerve damage (neuropathy) in your legs and feet, causing decreased feeling in them. This means that you may not notice minor injuries to your feet that could lead to more serious problems. Noticing and addressing any potential problems early is the best way to prevent future foot problems. How to care for your feet Foot  hygiene  Wash your feet daily with warm water and mild soap. Do not use hot water. Then, pat your feet and the areas between your toes until they are completely dry. Do not soak your feet as this can dry your skin.  Trim your toenails straight across. Do not dig under them or around the cuticle. File the edges of your nails with an emery board or nail file.  Apply a moisturizing lotion or petroleum jelly to the skin on your feet and to dry, brittle toenails. Use lotion that does not contain alcohol and is unscented. Do not apply lotion between your toes. Shoes and socks  Wear clean socks or stockings every day. Make sure they are not too tight. Do not wear knee-high stockings since they may decrease blood flow to your legs.  Wear shoes that fit properly and have enough cushioning. Always look in your shoes before you put them on to be sure there are no objects inside.  To break in new shoes, wear them for just a few hours a day. This prevents injuries on your feet. Wounds, scrapes, corns, and calluses  Check your feet daily for blisters, cuts, bruises, sores, and redness. If you cannot see the bottom of your feet, use a mirror or ask someone for help.  Do not cut corns or calluses or try to remove them with medicine.  If you find a minor scrape, cut, or break in the skin on your feet, keep it and the skin around it clean and dry. You may clean these areas with mild soap and water. Do not clean the area with peroxide, alcohol, or iodine.  If you have a wound, scrape, corn, or callus on your foot, look at it several times a day to make sure it is healing and not infected. Check for: ? Redness, swelling, or pain. ? Fluid or blood. ? Warmth. ? Pus or a bad smell. General instructions  Do not cross your legs. This may decrease blood flow to your feet.  Do not use heating pads or hot water bottles on your feet. They may burn your skin. If you have lost feeling in your feet or legs, you may  not know this is happening until it is too late.  Protect your feet from hot and cold by wearing shoes, such as at the beach or on hot pavement.  Schedule a complete foot exam at least once a year (annually) or more often if you have foot problems. If you have foot problems, report any cuts, sores, or bruises to your health care provider immediately. Contact a health care provider if:  You have a medical condition that increases your risk of infection and you have any cuts, sores, or bruises on your feet.  You have an injury that is not healing.  You have redness on your legs or feet.  You feel burning or tingling in your legs or feet.  You have pain or cramps in your legs and feet.  Your legs or feet are numb.  Your feet always feel cold.  You have pain around a toenail. Get help right away if:  You have a wound, scrape, corn, or callus on your foot and: ? You have pain, swelling, or redness that gets worse. ? You have fluid or blood coming from the wound, scrape, corn, or callus. ? Your wound, scrape, corn, or callus feels warm to the touch. ? You have pus or a bad smell coming from the wound, scrape, corn, or callus. ? You have a fever. ? You have a red line going up your leg. Summary  Check your feet every day for cuts, sores, red spots, swelling, and blisters.  Moisturize feet and legs daily.  Wear shoes that fit properly and have enough cushioning.  If you have foot problems, report any cuts, sores, or bruises to your health care provider immediately.  Schedule a complete foot exam at least once a year (annually) or more often if you have foot problems. This information is not intended to replace advice given to you by your health care provider. Make sure you discuss any questions you have with your health care provider. Document Revised: 09/20/2018 Document Reviewed: 01/30/2016 Elsevier Patient Education  2020 [employer].

## 2020-01-08 NOTE — Progress Notes (Signed)
Subjective:  Patient ID: Christine Rocha, female    DOB: 01-19-1955,  MRN: [id number]  64 y.o. female presents with at risk foot care with history of diabetic neuropathy and painful thick toenails that are difficult to trim. Pain interferes with ambulation. Aggravating factors include wearing enclosed shoe gear. Pain is relieved with periodic professional debridement..    Patient's blood sugar was 90 mg/dl this morning.  She would like to discuss treatment options for mycotic toenails.  PCP: Antony Blackbird, MD (Inactive) and last visit was: 09/19/2019.  Review of Systems: Negative except as noted in the HPI.  Past Medical History:  Diagnosis Date  . Hypertension   . Seizures ([place])    Past Surgical History:  Procedure Laterality Date  . ORIF FEMUR FRACTURE Left 04/24/2017   Procedure: OPEN REDUCTION INTERNAL FIXATION (ORIF) DISTAL FEMUR FRACTURE;  Surgeon: [name], MD;  Location: [facility];  Service: Orthopedics;  Laterality: Left;   Patient Active Problem List   Diagnosis Date Noted  . Femur fracture, left ([place]) 04/23/2017  . Preventative health care 10/28/2014  . Encounter for screening mammogram for breast cancer 10/28/2014  . Primary osteoarthritis of left knee 10/28/2014  . Essential hypertension 11/15/2013  . Diabetes ([place]) 11/15/2013  . Uncontrolled hypertension 10/11/2012  . Smoking addiction 10/11/2012    Current Outpatient Medications:  .  ciclopirox (PENLAC) 8 % solution, Apply one coat to affected toenails qd.  Remove weekly with polish remover., Disp: 6.6 mL, Rfl: 11 .  acetaminophen (TYLENOL) 325 MG tablet, Take 1-2 tablets (325-650 mg total) by mouth every 6 (six) hours as needed for mild pain (pain score 1-3 or temp > 100.5)., Disp: , Rfl:  .  amLODipine (NORVASC) 10 MG tablet, Take 1 tablet (10 mg total) by mouth daily. To lower blood pressure, Disp: 90 tablet, Rfl: 1 .  atenolol (TENORMIN) 100 MG tablet, Take 1 tablet (100 mg total) by mouth daily., Disp: 90  tablet, Rfl: 1 .  Blood Glucose Monitoring Suppl (CONTOUR MONITOR) w/Device KIT, 1 each by Does not apply route 2 (two) times daily., Disp: 1 kit, Rfl: 0 .  carboxymethylcellulose (REFRESH PLUS) 0.5 % SOLN, Place 1 drop into both eyes daily as needed (dry eyes)., Disp: , Rfl:  .  glipiZIDE (GLUCOTROL XL) 5 MG 24 hr tablet, TAKE 1 TABLET BY MOUTH EVERY DAY WITH BREAKFAST OR BEFORE THE FIRST MEAL OF THE DAY TO LOWER BLOOD SUGAR, Disp: 90 tablet, Rfl: 1 .  glucose blood (CONTOUR NEXT TEST) test strip, USE TO TEST BLOOD SUGAR TWICE DAILY, Disp: 100 strip, Rfl: 2 .  magnesium citrate SOLN, Take 296 mLs (1 Bottle total) by mouth once as needed for severe constipation., Disp: 195 mL, Rfl:  .  metFORMIN (GLUCOPHAGE) 1000 MG tablet, Take 0.5 tablets (500 mg total) by mouth 2 (two) times daily with a meal. To help control blood sugars, Disp: 180 tablet, Rfl: 1 .  metroNIDAZOLE (FLAGYL) 500 MG tablet, Take 1 tablet (500 mg total) by mouth 2 (two) times daily. To treat infection, Disp: 14 tablet, Rfl: 0 .  Microlet Lancets MISC, USE TWICE DAILY, Disp: 100 each, Rfl: 11 .  Multiple Vitamins-Minerals (WOMENS 50+ MULTI VITAMIN/MIN) TABS, Take 1 tablet by mouth daily., Disp: , Rfl:  .  polyethylene glycol (MIRALAX / GLYCOLAX) 17 g packet, Take 17 g by mouth daily as needed. Mixed into 16 or more ounces of fluid, Disp: 30 each, Rfl: 6 .  rosuvastatin (CRESTOR) 10 MG tablet, Take 1 tablet (10  mg total) by mouth daily. To lower cholesterol, Disp: 90 tablet, Rfl: 3 .  senna-docusate (SENOKOT-S) 8.6-50 MG tablet, Take 1 tablet by mouth 2 (two) times daily., Disp: , Rfl:  .  sertraline (ZOLOFT) 50 MG tablet, Take 1 tablet (50 mg total) by mouth daily., Disp: 30 tablet, Rfl: 3 Allergies  Allergen Reactions  . Penicillins    Social History   Tobacco Use  Smoking Status Current Every Day Smoker  . Packs/day: 0.25  . Types: Cigarettes  Smokeless Tobacco Never Used    Objective:  There were no vitals filed for this  visit. Constitutional Patient is a pleasant 64 y.o. African American female in NAD. AAO x 3.  Vascular Capillary refill time to digits immediate b/l. Palpable pedal pulses b/l LE. Pedal hair sparse. Lower extremity skin temperature gradient within normal limits. No cyanosis or clubbing noted.  Neurologic Normal speech. Protective sensation diminished with 10g monofilament b/l. Vibratory sensation diminished b/l.  Dermatologic Pedal skin with normal turgor, texture and tone bilaterally. No open wounds bilaterally. No interdigital macerations bilaterally. Toenails 1-5 b/l elongated, discolored, dystrophic, thickened, crumbly with subungual debris and tenderness to dorsal palpation. Hyperkeratotic lesion(s) L hallux and R hallux.  No erythema, no edema, no drainage, no fluctuance.  Orthopedic: Normal muscle strength 5/5 to all lower extremity muscle groups bilaterally. No pain crepitus or joint limitation noted with ROM b/l. No gross bony deformities bilaterally. Patient ambulates independent of any assistive aids.   Hemoglobin A1C Latest Ref Rng & Units 09/19/2019 03/27/2019  HGBA1C 4.8 - 5.6 % 7.8(H) 9.6(A)  Some recent data might be hidden   Assessment:   1. Onychomycosis   2. Pain due to onychomycosis of toenail   3. Callus   4. Diabetes mellitus due to underlying condition with diabetic autonomic neuropathy, unspecified whether long term insulin use ([place])    Plan:  Patient was evaluated and treated and all questions answered.  Onychomycosis with pain -Nails palliatively debridement as below. -Educated on self-care  Procedure: Nail Debridement Rationale: Pain Type of Debridement: manual, sharp debridement. Instrumentation: Nail nipper, rotary burr. Number of Nails: 10  -Examined patient. -Patient to continue soft, supportive shoe gear daily. -Continue diabetic foot care principles. Literature dispensed. -Discussed topical treatments available for onychomycosis. Patient opted for  topical treatment. Rx sent to pharmacy for 8% Ciclopirox Solution.  Apply one coat to each toenail once daily. Remove once weekly with nail polish remover. -Toenails 1-5 b/l were debrided in length and girth with sterile nail nippers and dremel without iatrogenic bleeding.  -Calluses pared b/l calluses utilizing sterile scalpel blade without incident. -She was advised to use pumice stone on heels on occasion for rough skin. Given list of moisturizers. -Patient to report any pedal injuries to medical professional immediately. -Patient/POA to call should there be question/concern in the interim.  Return in about 3 months (around 04/07/2020) for diabetic foot care.  Marzetta Board, DPM

## 2020-01-22 ENCOUNTER — Telehealth: Payer: Self-pay | Admitting: Orthopedic Surgery

## 2020-01-22 NOTE — Telephone Encounter (Signed)
Pt called asking if she is still going to be scheduled for surgery this month

## 2020-02-21 ENCOUNTER — Telehealth: Payer: Self-pay | Admitting: Orthopedic Surgery

## 2020-02-21 NOTE — Telephone Encounter (Signed)
Pt came in stating she was denied for surgery on her knee but she has medicaid C and has had it since 10/2019; she would like to try getting approved for surgery again and would like a CB to discuss further.   [phone]

## 2020-02-21 NOTE — Telephone Encounter (Signed)
Please see message below. Was to schedule right total knee but states insurance denied? Wants to know if she can try to have approved again?

## 2020-04-04 ENCOUNTER — Other Ambulatory Visit: Payer: Self-pay | Admitting: Family Medicine

## 2020-04-04 DIAGNOSIS — E1165 Type 2 diabetes mellitus with hyperglycemia: Secondary | ICD-10-CM

## 2020-04-08 ENCOUNTER — Encounter: Payer: Self-pay | Admitting: Podiatry

## 2020-04-08 ENCOUNTER — Ambulatory Visit (INDEPENDENT_AMBULATORY_CARE_PROVIDER_SITE_OTHER): Payer: Medicare Other | Admitting: Podiatry

## 2020-04-08 ENCOUNTER — Other Ambulatory Visit: Payer: Self-pay

## 2020-04-08 DIAGNOSIS — E0843 Diabetes mellitus due to underlying condition with diabetic autonomic (poly)neuropathy: Secondary | ICD-10-CM | POA: Diagnosis not present

## 2020-04-08 DIAGNOSIS — L84 Corns and callosities: Secondary | ICD-10-CM | POA: Diagnosis not present

## 2020-04-08 DIAGNOSIS — B351 Tinea unguium: Secondary | ICD-10-CM | POA: Diagnosis not present

## 2020-04-08 DIAGNOSIS — M79676 Pain in unspecified toe(s): Secondary | ICD-10-CM

## 2020-04-08 NOTE — Progress Notes (Signed)
  Subjective:  Patient ID: Christine Rocha, female    DOB: [birth year],  MRN: [id number]  65 y.o. female presents with at risk foot care with history of diabetic neuropathy and painful thick toenails that are difficult to trim. Pain interferes with ambulation. Aggravating factors include wearing enclosed shoe gear. Pain is relieved with periodic professional debridement..    Patient states she has been using Penlac nail lacquer as instructed and notes improvement in toenails since last visit. She states medication does get stuck on her socks at times.  PCP: Antony Blackbird, MD (Inactive) and last visit was: 09/19/2019.  Review of Systems: Negative except as noted in the HPI.   Allergies  Allergen Reactions  . Penicillins     Objective:  There were no vitals filed for this visit. Constitutional Patient is a pleasant 66 y.o. African American female in NAD. AAO x 3.  Vascular Capillary refill time to digits immediate b/l. Palpable pedal pulses b/l LE. Pedal hair sparse. Lower extremity skin temperature gradient within normal limits. No cyanosis or clubbing noted.  Neurologic Normal speech. Protective sensation diminished with 10g monofilament b/l. Vibratory sensation diminished b/l.  Dermatologic Pedal skin with normal turgor, texture and tone bilaterally. No open wounds bilaterally. No interdigital macerations bilaterally. Toenails 1-5 b/l elongated, discolored, dystrophic, thickened, crumbly with subungual debris and tenderness to dorsal palpation. Hyperkeratotic lesion(s) medial IPJ of L hallux and R hallux.  No erythema, no edema, no drainage, no fluctuance.  Orthopedic: Normal muscle strength 5/5 to all lower extremity muscle groups bilaterally. No pain crepitus or joint limitation noted with ROM b/l. No gross bony deformities bilaterally. Patient ambulates independent of any assistive aids.   Assessment:   1. Pain due to onychomycosis of toenail   2. Callus   3. Diabetes mellitus due to  underlying condition with diabetic autonomic neuropathy, unspecified whether long term insulin use ([place])    Plan:  Patient was evaluated and treated and all questions answered.  Onychomycosis with pain -Nails palliatively debridement as below. -Educated on self-care  Procedure: Nail Debridement Rationale: Pain Type of Debridement: manual, sharp debridement. Instrumentation: Nail nipper, rotary burr. Number of Nails: 10  -Examined patient. No new findings. No new orders.  -Patient to continue soft, supportive shoe gear daily. -Continue diabetic foot care principles. -Continue 8% Ciclopirox Solution to toenails once daily.  Apply one coat to each toenail once daily. Remove once weekly with nail polish remover. She was instructed to let medication dry about 30 minutes before donning socks. -Toenails 1-5 b/l were debrided in length and girth with sterile nail nippers and dremel without iatrogenic bleeding.  -Calluses pared b/l great toes utilizing sterile scalpel blade without incident. -Patient to report any pedal injuries to medical professional immediately. -Patient/POA to call should there be question/concern in the interim.  Return in about 3 months (around 07/09/2020).  Marzetta Board, DPM

## 2020-04-23 ENCOUNTER — Telehealth: Payer: Self-pay | Admitting: Orthopedic Surgery

## 2020-04-23 NOTE — Telephone Encounter (Signed)
I'm trying to reach Christine Rocha to discuss scheduling her right total knee replacement.  Left another message today for her to return my call to discuss.

## 2020-04-24 ENCOUNTER — Other Ambulatory Visit: Payer: Self-pay

## 2020-04-29 ENCOUNTER — Other Ambulatory Visit: Payer: Self-pay | Admitting: Physician Assistant

## 2020-05-05 NOTE — Pre-Procedure Instructions (Signed)
Surgical Instructions    Your procedure is scheduled on Friday April 29th.  Report to [facility] Main Entrance "A" at [place].M., then check in with the Admitting office.  Call this number if you have problems the morning of surgery:  [phone]   If you have any questions prior to your surgery date call [phone]: Open Monday-Friday 8am-4pm    Remember:  Do not eat after midnight the night before your surgery  You may drink clear liquids until 0645 the morning of your surgery.   Clear liquids allowed are: Water, Non-Citrus Juices (without pulp), Carbonated Beverages, Clear Tea, Black Coffee Only, and Gatorade  Patient Instructions  . The night before surgery:  o No food after midnight. ONLY clear liquids after midnight  . The day of surgery (if you have diabetes): o Drink ONE (1) 10 oz water bottle given to you in your pre admission testing appointment by  0645 AM the morning of surgery. Drink in one sitting. Do not sip.  o This drink was given to you during your hospital  pre-op appointment visit.  o Nothing else to drink after completing the  10 oz bottle of water.          If you have questions, please contact your surgeon's office.   Take these medicines the morning of surgery with A SIP OF WATER   amLODipine (NORVASC)  atenolol (TENORMIN)  metroNIDAZOLE (FLAGYL)  rosuvastatin (CRESTOR)  senna-docusate (SENOKOT-S  sertraline (ZOLOFT)     Take these medicines if needed  carboxymethylcellulose (REFRESH PLUS)   WHAT DO I DO ABOUT MY DIABETES MEDICATION?  Do not take the evening dose of glipiZIDE (GLUCOTROL) on Thursday 05/08/20 and do not take glipiZIDE (GLUCOTROL) on Friday morning 05/09/20.  Do not take metFORMIN (GLUCOPHAGE) the morning of surgery 05/09/20.   HOW TO MANAGE YOUR DIABETES BEFORE AND AFTER SURGERY  Why is it important to control my blood sugar before and after surgery? . Improving blood sugar levels before and after surgery helps healing and  can limit problems. . A way of improving blood sugar control is eating a healthy diet by: o  Eating less sugar and carbohydrates o  Increasing activity/exercise o  Talking with your doctor about reaching your blood sugar goals . High blood sugars (greater than 180 mg/dL) can raise your risk of infections and slow your recovery, so you will need to focus on controlling your diabetes during the weeks before surgery. . Make sure that the doctor who takes care of your diabetes knows about your planned surgery including the date and location.  How do I manage my blood sugar before surgery? . Check your blood sugar at least 4 times a day, starting 2 days before surgery, to make sure that the level is not too high or low. . Check your blood sugar the morning of your surgery when you wake up and every 2 hours until you get to the Short Stay unit. o If your blood sugar is less than 70 mg/dL, you will need to treat for low blood sugar: - Do not take insulin. - Treat a low blood sugar (less than 70 mg/dL) with  cup of clear juice (cranberry or apple), 4 glucose tablets, OR glucose gel. - Recheck blood sugar in 15 minutes after treatment (to make sure it is greater than 70 mg/dL). If your blood sugar is not greater than 70 mg/dL on recheck, call [phone] for further instructions. . Report your blood sugar to the short stay  nurse when you get to Short Stay.  . If you are admitted to the hospital after surgery: o Your blood sugar will be checked by the staff and you will probably be given insulin after surgery (instead of oral diabetes medicines) to make sure you have good blood sugar levels. o The goal for blood sugar control after surgery is 80-180 mg/dL.   As of today, STOP taking any Aspirin (unless otherwise instructed by your surgeon) Aleve, Naproxen, Ibuprofen, Motrin, Advil, Goody's, BC's, all herbal medications, fish oil, and all vitamins.                     Do not wear jewelry, make up, or  nail polish            Do not wear lotions, powders, perfumes, or deodorant.            Do not shave 48 hours prior to surgery.              Do not bring valuables to the hospital.            [facility] is not responsible for any belongings or valuables.  Do NOT Smoke (Tobacco/Vaping) or drink Alcohol 24 hours prior to your procedure If you use a CPAP at night, you may bring all equipment for your overnight stay.   Contacts, glasses, dentures or partials may not be worn into surgery, please bring cases for these belongings   For patients admitted to the hospital, discharge time will be determined by your treatment team.   Patients discharged the day of surgery will not be allowed to drive home, and someone needs to stay with them for 24 hours.    Special instructions:   [place]- Preparing For Surgery  Before surgery, you can play an important role. Because skin is not sterile, your skin needs to be as free of germs as possible. You can reduce the number of germs on your skin by washing with CHG (chlorahexidine gluconate) Soap before surgery.  CHG is an antiseptic cleaner which kills germs and bonds with the skin to continue killing germs even after washing.    Oral Hygiene is also important to reduce your risk of infection.  Remember - BRUSH YOUR TEETH THE MORNING OF SURGERY WITH YOUR REGULAR TOOTHPASTE  Please do not use if you have an allergy to CHG or antibacterial soaps. If your skin becomes reddened/irritated stop using the CHG.  Do not shave (including legs and underarms) for at least 48 hours prior to first CHG shower. It is OK to shave your face.  Please follow these instructions carefully.   1. Shower the NIGHT BEFORE SURGERY and the MORNING OF SURGERY  2. If you chose to wash your hair, wash your hair first as usual with your normal shampoo.  3. After you shampoo, rinse your hair and body thoroughly to remove the shampoo.  4. Use CHG Soap as you would any other  liquid soap. You can apply CHG directly to the skin and wash gently with a scrungie or a clean washcloth.   5. Apply the CHG Soap to your body ONLY FROM THE NECK DOWN.  Do not use on open wounds or open sores. Avoid contact with your eyes, ears, mouth and genitals (private parts). Wash Face and genitals (private parts)  with your normal soap.   6. Wash thoroughly, paying special attention to the area where your surgery will be performed.  7. Thoroughly rinse your  body with warm water from the neck down.  8. DO NOT shower/wash with your normal soap after using and rinsing off the CHG Soap.  9. Pat yourself dry with a CLEAN TOWEL.  10. Wear CLEAN PAJAMAS to bed the night before surgery  11. Place CLEAN SHEETS on your bed the night before your surgery  12. DO NOT SLEEP WITH PETS.   Day of Surgery: Take a shower with CHG soap. Wear Clean/Comfortable clothing the morning of surgery Do not apply any deodorants/lotions.   Remember to brush your teeth WITH YOUR REGULAR TOOTHPASTE.   Please read over the following fact sheets that you were given.

## 2020-05-06 ENCOUNTER — Encounter (HOSPITAL_COMMUNITY): Payer: Self-pay | Admitting: Physician Assistant

## 2020-05-06 ENCOUNTER — Other Ambulatory Visit: Payer: Self-pay

## 2020-05-06 ENCOUNTER — Encounter (HOSPITAL_COMMUNITY): Payer: Self-pay

## 2020-05-06 ENCOUNTER — Encounter (HOSPITAL_COMMUNITY)
Admission: RE | Admit: 2020-05-06 | Discharge: 2020-05-06 | Disposition: A | Discharge status: Home / Self Care | Payer: Medicare Other | Source: Ambulatory Visit | Attending: Orthopedic Surgery | Admitting: Orthopedic Surgery

## 2020-05-06 DIAGNOSIS — Z01812 Encounter for preprocedural laboratory examination: Secondary | ICD-10-CM | POA: Diagnosis not present

## 2020-05-06 DIAGNOSIS — Z20822 Contact with and (suspected) exposure to covid-19: Secondary | ICD-10-CM | POA: Insufficient documentation

## 2020-05-06 HISTORY — DX: Type 2 diabetes mellitus without complications: E11.9

## 2020-05-06 HISTORY — DX: Unspecified osteoarthritis, unspecified site: M19.90

## 2020-05-06 LAB — BASIC METABOLIC PANEL
Anion gap: 9 (ref 5–15)
BUN: 8 mg/dL (ref 8–23)
CO2: 28 mmol/L (ref 22–32)
Calcium: 10.1 mg/dL (ref 8.9–10.3)
Chloride: 101 mmol/L (ref 98–111)
Creatinine, Ser: 0.75 mg/dL (ref 0.44–1.00)
GFR, Estimated: >60 mL/min (ref >60)
Glucose, Bld: 256 mg/dL — ABNORMAL HIGH (ref 70–99)
Potassium: 3.6 mmol/L (ref 3.5–5.1)
Sodium: 138 mmol/L (ref 135–145)

## 2020-05-06 LAB — HEMOGLOBIN A1C
Hgb A1c MFr Bld: 9.4 % — ABNORMAL HIGH (ref 4.8–5.6)
Mean Plasma Glucose: 223.08 mg/dL

## 2020-05-06 LAB — CBC
HCT: 42.2 % (ref 36.0–46.0)
Hemoglobin: 13.6 g/dL (ref 12.0–15.0)
MCH: 29.1 pg (ref 26.0–34.0)
MCHC: 32.2 g/dL (ref 30.0–36.0)
MCV: 90.4 fL (ref 80.0–100.0)
Platelets: 268 10*3/uL (ref 150–400)
RBC: 4.67 MIL/uL (ref 3.87–5.11)
RDW: 12.8 % (ref 11.5–15.5)
WBC: 6.6 10*3/uL (ref 4.0–10.5)
nRBC: 0 % (ref 0.0–0.2)

## 2020-05-06 LAB — SURGICAL PCR SCREEN
MRSA, PCR: NEGATIVE
Staphylococcus aureus: NEGATIVE

## 2020-05-06 LAB — GLUCOSE, CAPILLARY: Glucose-Capillary: 254 mg/dL — ABNORMAL HIGH (ref 70–99)

## 2020-05-06 LAB — SARS CORONAVIRUS 2 (TAT 6-24 HRS): SARS Coronavirus 2: NEGATIVE

## 2020-05-06 NOTE — Progress Notes (Signed)
PCP - Dr. Antony Blackbird- [facility] Cardiologist - Denies   Chest x-ray - n/a EKG - 05/06/20 Stress Test - denies ECHO - denies Cardiac Cath - denies  Sleep Study - Denies  CBG 05/06/20- 254. Patient has a bowl of Lucky Charms at 430 am Fasting Blood Sugar - 130's-140's per patient Checks Blood Sugar two times a day  BP-173/104. Recheck. 181/96.   ERAS Protcol - Yes PRE-SURGERY Ensure or G2- Water  COVID TEST- 05/06/20 Pending. Patient aware to quarantine until surgery date.   Anesthesia review: Yes. Called and spoke to Karoline Caldwell, [place] with anesthesia. Jeneen Rinks made aware that patient did not take her BP meds this am and had a bowl of Lucky Charms around 430 am. Per Jeneen Rinks, patient instructed to go home and take her BP meds and monitor BP. Per Jeneen Rinks, patient instructed to call PCP if BP remains elevated.   Patient denies shortness of breath, fever, cough and chest pain at PAT appointment   All instructions explained to the patient, with a verbal understanding of the material. Patient agrees to go over the instructions while at home for a better understanding. Patient also instructed to self quarantine after being tested for COVID-19. The opportunity to ask questions was provided.

## 2020-05-07 DIAGNOSIS — E119 Type 2 diabetes mellitus without complications: Secondary | ICD-10-CM | POA: Diagnosis not present

## 2020-05-07 DIAGNOSIS — H35363 Drusen (degenerative) of macula, bilateral: Secondary | ICD-10-CM | POA: Diagnosis not present

## 2020-05-07 LAB — HM DIABETES EYE EXAM

## 2020-05-09 ENCOUNTER — Ambulatory Visit (HOSPITAL_COMMUNITY): Admission: RE | Admit: 2020-05-09 | Payer: Medicare Other | Source: Home / Self Care | Admitting: Orthopedic Surgery

## 2020-05-09 ENCOUNTER — Encounter (HOSPITAL_COMMUNITY): Admission: RE | Payer: Self-pay | Source: Home / Self Care

## 2020-05-09 SURGERY — ARTHROPLASTY, KNEE, TOTAL
Anesthesia: Choice | Site: Knee | Laterality: Right

## 2020-06-20 ENCOUNTER — Encounter: Payer: Self-pay | Admitting: Nurse Practitioner

## 2020-06-20 ENCOUNTER — Other Ambulatory Visit: Payer: Self-pay

## 2020-06-20 ENCOUNTER — Ambulatory Visit: Payer: Medicare Other | Attending: Nurse Practitioner | Admitting: Nurse Practitioner

## 2020-06-20 DIAGNOSIS — E1165 Type 2 diabetes mellitus with hyperglycemia: Secondary | ICD-10-CM

## 2020-06-20 DIAGNOSIS — K5900 Constipation, unspecified: Secondary | ICD-10-CM

## 2020-06-20 DIAGNOSIS — I10 Essential (primary) hypertension: Secondary | ICD-10-CM | POA: Diagnosis not present

## 2020-06-20 DIAGNOSIS — E785 Hyperlipidemia, unspecified: Secondary | ICD-10-CM | POA: Diagnosis not present

## 2020-06-20 DIAGNOSIS — F17200 Nicotine dependence, unspecified, uncomplicated: Secondary | ICD-10-CM

## 2020-06-20 MED ORDER — METFORMIN HCL 1000 MG PO TABS
1000.0000 mg | ORAL_TABLET | Freq: Two times a day (BID) | ORAL | 0 refills | Status: DC
  Filled 2020-06-20: qty 180, 90d supply, fill #0

## 2020-06-20 MED ORDER — AMLODIPINE BESYLATE 10 MG PO TABS
10.0000 mg | ORAL_TABLET | Freq: Every day | ORAL | 1 refills | Status: DC
  Filled 2020-06-20: qty 90, 90d supply, fill #0

## 2020-06-20 MED ORDER — GLIPIZIDE ER 5 MG PO TB24: ORAL_TABLET | ORAL | 1 refills | Status: DC

## 2020-06-20 MED ORDER — LISINOPRIL 10 MG PO TABS
10.0000 mg | ORAL_TABLET | Freq: Every day | ORAL | 3 refills | Status: DC
Start: 2020-06-20 — End: 2021-02-09
  Filled 2020-06-20: qty 90, 90d supply, fill #0

## 2020-06-20 MED ORDER — BUPROPION HCL ER (SR) 150 MG PO TB12
ORAL_TABLET | ORAL | 2 refills | Status: DC
  Filled 2020-06-20: qty 60, 30d supply, fill #0

## 2020-06-20 MED ORDER — ROSUVASTATIN CALCIUM 10 MG PO TABS
10.0000 mg | ORAL_TABLET | Freq: Every day | ORAL | 3 refills | Status: DC
Start: 2020-06-20 — End: 2020-06-20

## 2020-06-20 MED ORDER — ATENOLOL 100 MG PO TABS
100.0000 mg | ORAL_TABLET | Freq: Every day | ORAL | 1 refills | Status: DC
  Filled 2020-06-20: qty 90, 90d supply, fill #0

## 2020-06-20 MED ORDER — LISINOPRIL 10 MG PO TABS: 10.0000 mg | ORAL_TABLET | Freq: Every day | ORAL | 3 refills | Status: DC

## 2020-06-20 MED ORDER — ROSUVASTATIN CALCIUM 10 MG PO TABS
10.0000 mg | ORAL_TABLET | Freq: Every day | ORAL | 3 refills | Status: DC
  Filled 2020-06-20: qty 90, 90d supply, fill #0

## 2020-06-20 MED ORDER — BUPROPION HCL ER (SR) 150 MG PO TB12: ORAL_TABLET | ORAL | 2 refills | Status: DC

## 2020-06-20 MED ORDER — MICROLET LANCETS MISC: 11 refills | Status: DC

## 2020-06-20 MED ORDER — GLIPIZIDE ER 5 MG PO TB24
ORAL_TABLET | ORAL | 1 refills | Status: DC
  Filled 2020-06-20: qty 90, 90d supply, fill #0

## 2020-06-20 MED ORDER — POLYETHYLENE GLYCOL 3350 17 G PO PACK
17.0000 g | PACK | Freq: Every day | ORAL | 6 refills | Status: DC | PRN
Start: 2020-06-20 — End: 2021-04-22
  Filled 2020-06-20: qty 30, 30d supply, fill #0

## 2020-06-20 MED ORDER — ATENOLOL 100 MG PO TABS
100.0000 mg | ORAL_TABLET | Freq: Every day | ORAL | 1 refills | Status: DC
Start: 2020-06-20 — End: 2020-06-20

## 2020-06-20 MED ORDER — CONTOUR NEXT TEST VI STRP
ORAL_STRIP | 2 refills | Status: DC
  Filled 2020-06-20: qty 100, 30d supply, fill #0

## 2020-06-20 MED ORDER — MICROLET LANCETS MISC
11 refills | Status: DC
  Filled 2020-06-20: qty 100, 50d supply, fill #0

## 2020-06-20 MED ORDER — AMLODIPINE BESYLATE 10 MG PO TABS: 10.0000 mg | ORAL_TABLET | Freq: Every day | ORAL | 1 refills | Status: DC

## 2020-06-20 MED ORDER — SENNOSIDES-DOCUSATE SODIUM 8.6-50 MG PO TABS: 1.0000 | ORAL_TABLET | Freq: Two times a day (BID) | ORAL | Status: DC

## 2020-06-20 MED ORDER — CONTOUR NEXT TEST VI STRP: ORAL_STRIP | 2 refills | Status: DC

## 2020-06-20 MED ORDER — METFORMIN HCL 1000 MG PO TABS: 1000.0000 mg | ORAL_TABLET | Freq: Two times a day (BID) | ORAL | 0 refills | Status: DC

## 2020-06-20 MED ORDER — SENNOSIDES-DOCUSATE SODIUM 8.6-50 MG PO TABS
1.0000 | ORAL_TABLET | Freq: Two times a day (BID) | ORAL | Status: DC
Start: 2020-06-20 — End: 2020-06-20

## 2020-06-20 NOTE — Progress Notes (Signed)
Virtual Visit via Telephone Note Due to national recommendations of social distancing due to [place] 19, telehealth visit is felt to be most appropriate for this patient at this time.  I discussed the limitations, risks, security and privacy concerns of performing an evaluation and management service by telephone and the availability of in person appointments. I also discussed with the patient that there may be a patient responsible charge related to this service. The patient expressed understanding and agreed to proceed.    I connected with Christine Rocha on 06/20/20  at   3:10 PM EDT  EDT by telephone and verified that I am speaking with the correct person using two identifiers.  Location of Patient: Private Residence   Location of Provider: [facility] [employer] [facility]    Persons participating in Telemedicine visit: Christine Rocha Christine Rocha    History of Present Illness: Telemedicine visit for: Establish care with new provider  She has a past medical history of Arthritis, DM2 , Hypertension, and Seizures    Seizures She denies any recent seizure activity in several years.     Dm2 Poorly controlled. She is taking glipizide 5 mg  daily and metformin 500 mg BID. Will increase to 1000 mg BID. LDL not at goal with crestor 10 mg daily. She denies any statin intolerance.  Lab Results  Component Value Date   HGBA1C 9.4 (H) 05/06/2020    Lab Results  Component Value Date   LDLCALC 102 (H) 09/19/2019     Essential Hypertension Poorly controlled. She is taking amlodipine 10 mg daily, atenolol 100 mg daily. Will add lisinopril today. Denies chest pain, shortness of breath, palpitations, lightheadedness, dizziness, headaches or BLE edema.   She wants to stop smoking. Has tried nicotine gum and patches neither of which were effective. Will try Wellbutrin today.  BP Readings from Last 3 Encounters:  05/06/20 (!) 178/103  09/19/19 (!) 158/94  09/11/19 (!)  171/137      Past Medical History:  Diagnosis Date   Arthritis    Right Knee   Diabetes mellitus without complication ([place])    Hypertension    Seizures ([place])     Past Surgical History:  Procedure Laterality Date   COLONOSCOPY  2021   ORIF FEMUR FRACTURE Left 04/24/2017   Procedure: OPEN REDUCTION INTERNAL FIXATION (ORIF) DISTAL FEMUR FRACTURE;  Surgeon: [name], MD;  Location: [facility];  Service: Orthopedics;  Laterality: Left;    Family History  Problem Relation Age of Onset   Hypertension Mother    Breast cancer Mother    Hypertension Sister    Diabetes Sister     Social History   Socioeconomic History   Marital status: Married    Spouse name: Not on file   Number of children: Not on file   Years of education: Not on file   Highest education level: Not on file  Occupational History   Not on file  Tobacco Use   Smoking status: Every Day    Packs/day: 0.25    Years: 49.00    Pack years: 12.25    Types: Cigarettes   Smokeless tobacco: Never  Vaping Use   Vaping Use: Never used  Substance and Sexual Activity   Alcohol use: No   Drug use: No   Sexual activity: Never    Birth control/protection: None  Other Topics Concern   Not on file  Social History Narrative   Not on file   Social Determinants of Health  Financial Resource Strain: Not on file  Food Insecurity: Not on file  Transportation Needs: Not on file  Physical Activity: Not on file  Stress: Not on file  Social Connections: Not on file     Observations/Objective: Awake, alert and oriented x 3   Review of Systems  Constitutional:  Negative for fever, malaise/fatigue and weight loss.  HENT: Negative.  Negative for nosebleeds.   Eyes: Negative.  Negative for blurred vision, double vision and photophobia.  Respiratory: Negative.  Negative for cough and shortness of breath.   Cardiovascular: Negative.  Negative for chest pain, palpitations and leg swelling.  Gastrointestinal:  Positive for  constipation. Negative for abdominal pain, blood in stool, diarrhea, heartburn, melena, nausea and vomiting.  Musculoskeletal: Negative.  Negative for myalgias.  Neurological: Negative.  Negative for dizziness, focal weakness, seizures and headaches.  Psychiatric/Behavioral: Negative.  Negative for suicidal ideas.    Assessment and Plan: Christine Rocha was seen today for medication refill.  Diagnoses and all orders for this visit:  Essential hypertension -     amLODipine (NORVASC) 10 MG tablet; Take 1 tablet (10 mg total) by mouth daily. To lower blood pressure -     atenolol (TENORMIN) 100 MG tablet; Take 1 tablet (100 mg total) by mouth daily. -     lisinopril (ZESTRIL) 10 MG tablet; Take 1 tablet (10 mg total) by mouth daily. Continue all antihypertensives as prescribed.  Remember to bring in your blood pressure log with you for your follow up appointment.  DASH/Mediterranean Diets are healthier choices for HTN.    Type 2 diabetes mellitus with hyperglycemia, without long-term current use of insulin (HCC) -     glipiZIDE (GLUCOTROL XL) 5 MG 24 hr tablet; TAKE 1 TABLET BY MOUTH EVERY DAY WITH BREAKFAST OR BEFORE THE FIRST MEAL OF THE DAY TO LOWER BLOOD SUGAR -     metFORMIN (GLUCOPHAGE) 1000 MG tablet; Take 1 tablet (1,000 mg total) by mouth 2 (two) times daily with a meal. -     glucose blood (CONTOUR NEXT TEST) test strip; Use as instructed -     Microlet Lancets MISC; USE TWICE DAILY Continue blood sugar control as discussed in office today, low carbohydrate diet, and regular physical exercise as tolerated, 150 minutes per week (30 min each day, 5 days per week, or 50 min 3 days per week). Keep blood sugar logs with fasting goal of 90-130 mg/dl, post prandial (after you eat) less than 180.  For Hypoglycemia: BS <60 and Hyperglycemia BS >400; contact the clinic ASAP. Annual eye exams and foot exams are recommended.   Dyslipidemia, goal LDL below 70 -     rosuvastatin (CRESTOR) 10 MG tablet; Take  1 tablet (10 mg total) by mouth daily. To lower cholesterol INSTRUCTIONS: Work on a low fat, heart healthy diet and participate in regular aerobic exercise program by working out at least 150 minutes per week; 5 days a week-30 minutes per day. Avoid red meat/beef/steak,  fried foods. junk foods, sodas, sugary drinks, unhealthy snacking, alcohol and smoking.  Drink at least 80 oz of water per day and monitor your carbohydrate intake daily.     Tobacco dependence -     buPROPion (WELLBUTRIN SR) 150 MG 12 hr tablet; Take 1 tablet or  150 mg by mouth once a day for three days, then 150 mg twice daily thereafter  Constipation, unspecified constipation type -     senna-docusate (SENOKOT-S) 8.6-50 MG tablet; Take 1 tablet by mouth 2 (two) times  daily.    Follow Up Instructions Return in about 8 weeks (around 08/15/2020).     I discussed the assessment and treatment plan with the patient. The patient was provided an opportunity to ask questions and all were answered. The patient agreed with the plan and demonstrated an understanding of the instructions.   The patient was advised to call back or seek an in-person evaluation if the symptoms worsen or if the condition fails to improve as anticipated.  I provided 17 minutes of non-face-to-face time during this encounter including median intraservice time, reviewing previous notes, labs, imaging, medications and explaining diagnosis and management.  Gildardo Pounds, Rocha

## 2020-06-20 NOTE — Progress Notes (Signed)
Needs refills on medications and testing supplies.  Wants to quit smoking.

## 2020-06-23 ENCOUNTER — Other Ambulatory Visit: Payer: Self-pay

## 2020-07-02 ENCOUNTER — Telehealth: Payer: Self-pay | Admitting: Nurse Practitioner

## 2020-07-02 NOTE — Telephone Encounter (Signed)
Patient can keep appointment.

## 2020-07-02 NOTE — Telephone Encounter (Signed)
Copied from Christine Rocha [phone]. Topic: General - Other >> Jul 02, 2020  9:14 AM Keene Breath wrote: Reason for CRM: Patient would like the nurse to call her regarding an AWV.  She stated she was told to schedule this with Dr. Wynetta Emery.  Please call patient to discuss at [phone]  The patient has a AWV with Raul Del on 8/3. I did not see anywhere in the chart to be seen by Dr. Wynetta Emery. Let me know if the appt will need to be changed for Dr. Wynetta Emery instead of Raul Del.

## 2020-07-23 ENCOUNTER — Other Ambulatory Visit: Payer: Self-pay

## 2020-07-23 ENCOUNTER — Encounter: Payer: Self-pay | Admitting: Podiatry

## 2020-07-23 ENCOUNTER — Ambulatory Visit (INDEPENDENT_AMBULATORY_CARE_PROVIDER_SITE_OTHER): Payer: Medicare Other | Admitting: Podiatry

## 2020-07-23 DIAGNOSIS — L84 Corns and callosities: Secondary | ICD-10-CM | POA: Diagnosis not present

## 2020-07-23 DIAGNOSIS — E1142 Type 2 diabetes mellitus with diabetic polyneuropathy: Secondary | ICD-10-CM

## 2020-07-23 DIAGNOSIS — B351 Tinea unguium: Secondary | ICD-10-CM

## 2020-07-23 DIAGNOSIS — M79676 Pain in unspecified toe(s): Secondary | ICD-10-CM | POA: Diagnosis not present

## 2020-07-26 NOTE — Progress Notes (Signed)
Subjective: LATIVIA VELIE is a pleasant 65 y.o. female patient seen today painful thick toenails that are difficult to trim. Pain interferes with ambulation. Aggravating factors include wearing enclosed shoe gear. Pain is relieved with periodic professional debridement.  She continues to use [place] Nail Lacquer on her toenails daily.   Patient states her blood glucose was 138 mg/dl on yesterday.  PCP is Gildardo Pounds, NP. Last visit was: 06/20/2020 via telemedicine.  Allergies  Allergen Reactions   Penicillins Nausea Only   Objective: Physical Exam  General: MARLYNE TOTARO is a pleasant 65 y.o. African American female, in NAD. AAO x 3.   Vascular:  Capillary refill time to digits immediate b/l. Palpable pedal pulses b/l LE. Pedal hair sparse. Lower extremity skin temperature gradient within normal limits. No edema noted b/l lower extremities.  Dermatological:  Pedal skin with normal turgor, texture and tone b/l lower extremities. Toenails 1-5 b/l elongated, discolored, dystrophic, thickened, crumbly with subungual debris and tenderness to dorsal palpation. She does have clearing of fungus proximal 1/3 of b/l great toes.Hyperkeratotic lesion(s) L hallux and R hallux.  No erythema, no edema, no drainage, no fluctuance.  Musculoskeletal:  Normal muscle strength 5/5 to all lower extremity muscle groups bilaterally. No pain crepitus or joint limitation noted with ROM b/l. No gross bony deformities bilaterally.  Neurological:  Protective sensation diminished with 10g monofilament b/l. Vibratory sensation diminished b/l.  Assessment and Plan:  1. Pain due to onychomycosis of toenail   2. Callus   3. Diabetic peripheral neuropathy associated with type 2 diabetes mellitus ([place])     -Examined patient. -Patient to continue Penlac Nail Lacquer to toenails daily. -Continue diabetic foot care principles. -Patient to continue soft, supportive shoe gear daily. -Toenails 1-5 b/l were  debrided in length and girth with sterile nail nippers and dremel without iatrogenic bleeding.  -Callus(es) L hallux and R hallux pared utilizing sterile scalpel blade without complication or incident. Total number debrided =2. -Patient to report any pedal injuries to medical professional immediately. -Patient/POA to call should there be question/concern in the interim.  Return in about 3 months (around 10/23/2020).  Marzetta Board, DPM

## 2020-08-13 ENCOUNTER — Ambulatory Visit: Payer: Medicare Other | Admitting: Nurse Practitioner

## 2020-09-18 DIAGNOSIS — E1165 Type 2 diabetes mellitus with hyperglycemia: Secondary | ICD-10-CM | POA: Diagnosis not present

## 2020-09-18 DIAGNOSIS — E119 Type 2 diabetes mellitus without complications: Secondary | ICD-10-CM | POA: Diagnosis not present

## 2020-09-29 ENCOUNTER — Ambulatory Visit: Payer: Medicare Other | Attending: Nurse Practitioner | Admitting: Nurse Practitioner

## 2020-09-29 ENCOUNTER — Encounter: Payer: Self-pay | Admitting: Nurse Practitioner

## 2020-09-29 ENCOUNTER — Other Ambulatory Visit: Payer: Self-pay

## 2020-09-29 VITALS — BP 124/71 | HR 67 | Ht 63.0 in | Wt 190.5 lb

## 2020-09-29 DIAGNOSIS — I10 Essential (primary) hypertension: Secondary | ICD-10-CM

## 2020-09-29 DIAGNOSIS — Z23 Encounter for immunization: Secondary | ICD-10-CM | POA: Diagnosis not present

## 2020-09-29 DIAGNOSIS — Z1159 Encounter for screening for other viral diseases: Secondary | ICD-10-CM | POA: Diagnosis not present

## 2020-09-29 DIAGNOSIS — E1165 Type 2 diabetes mellitus with hyperglycemia: Secondary | ICD-10-CM

## 2020-09-29 DIAGNOSIS — E785 Hyperlipidemia, unspecified: Secondary | ICD-10-CM | POA: Diagnosis not present

## 2020-09-29 LAB — POCT GLYCOSYLATED HEMOGLOBIN (HGB A1C): Hemoglobin A1C: 12.8 % — AB (ref 4.0–5.6)

## 2020-09-29 LAB — GLUCOSE, POCT (MANUAL RESULT ENTRY): POC Glucose: 290 mg/dl — AB (ref 70–99)

## 2020-09-29 MED ORDER — BD PEN NEEDLE MINI U/F 31G X 5 MM MISC: 1 refills | Status: DC

## 2020-09-29 MED ORDER — LANTUS SOLOSTAR 100 UNIT/ML ~~LOC~~ SOPN: 20.0000 [IU] | PEN_INJECTOR | Freq: Every day | SUBCUTANEOUS | 99 refills | Status: DC

## 2020-09-29 NOTE — Progress Notes (Signed)
Assessment & Plan:  Christine Rocha was seen today for hypertension and diabetes.  Diagnoses and all orders for this visit:  Type 2 diabetes mellitus with hyperglycemia, without long-term current use of insulin (HCC) -     CMP14+EGFR -     POCT glucose (manual entry) -     POCT glycosylated hemoglobin (Hb A1C) -     Ambulatory referral to diabetic education Continue blood sugar control as discussed in office today, low carbohydrate diet, and regular physical exercise as tolerated, 150 minutes per week (30 min each day, 5 days per week, or 50 min 3 days per week). Keep blood sugar logs with fasting goal of 90-130 mg/dl, post prandial (after you eat) less than 180.  For Hypoglycemia: BS <60 and Hyperglycemia BS >400; contact the clinic ASAP. Annual eye exams and foot exams are recommended.   Primary hypertension Continue all antihypertensives as prescribed.  Remember to bring in your blood pressure log with you for your follow up appointment.  DASH/Mediterranean Diets are healthier choices for HTN.    Dyslipidemia, goal LDL below 70 -     Lipid panel INSTRUCTIONS: Work on a low fat, heart healthy diet and participate in regular aerobic exercise program by working out at least 150 minutes per week; 5 days a week-30 minutes per day. Avoid red meat/beef/steak,  fried foods. junk foods, sodas, sugary drinks, unhealthy snacking, alcohol and smoking.  Drink at least 80 oz of water per day and monitor your carbohydrate intake daily.    Need for immunization against influenza -     Flu Vaccine QUAD 61moIM (Fluarix, Fluzone & Alfiuria Quad PF)  Need for hepatitis C screening test -     HCV Ab w Reflex to Quant PCR   Patient has been counseled on age-appropriate routine health concerns for screening and prevention. These are reviewed and up-to-date. Referrals have been placed accordingly. Immunizations are up-to-date or declined.    Subjective:   Chief Complaint  Patient presents with   Hypertension    Diabetes   HPI Christine [id number].o. female presents to office today for follow up. She has a past medical history of Arthritis, Diabetes mellitus without complication, Hypertension, and Seizures.    DM 2 Poorly controlled. She has not been taking all of her medications as prescribed. Will add lantus 20 units today. She will continue on glipizide 5 mg daily and metformin 1000 mg BID. She denies any symptoms of hypoglycemia. She does have diabetic/hypertensive retinopathy for which she is seeing the ophthalmologist. LDL not at goal with crestor 10 mg daily.  Lab Results  Component Value Date   HGBA1C 12.8 (A) 09/29/2020    Lab Results  Component Value Date   HGBA1C 9.4 (H) 05/06/2020    Lab Results  Component Value Date   LDLCALC 102 (H) 09/19/2019     Hypertension Blood pressure is well controlled with amlodipine 10 mg daily, atenolol 100 mg daily,  lisinopril 10 mg daily. She just recently restarted her blood pressure medications after she was told by her eye doctor of her severe retinopathy. Denies chest pain, shortness of breath, palpitations, lightheadedness, dizziness, headaches or BLE edema.   BP Readings from Last 3 Encounters:  09/29/20 124/71  05/06/20 (!) 178/103  09/19/19 (!) 158/94    Review of Systems  Constitutional:  Negative for fever, malaise/fatigue and weight loss.  HENT: Negative.  Negative for nosebleeds.   Eyes: Negative.  Negative for blurred vision, double vision and photophobia.  Respiratory: Negative.  Negative for cough and shortness of breath.   Cardiovascular: Negative.  Negative for chest pain, palpitations and leg swelling.  Gastrointestinal: Negative.  Negative for heartburn, nausea and vomiting.  Musculoskeletal: Negative.  Negative for myalgias.  Neurological: Negative.  Negative for dizziness, focal weakness, seizures and headaches.  Psychiatric/Behavioral: Negative.  Negative for suicidal ideas.    Past Medical History:  Diagnosis Date    Arthritis    Right Knee   Diabetes mellitus without complication ([place])    Hypertension    Seizures ([place])     Past Surgical History:  Procedure Laterality Date   COLONOSCOPY  2021   ORIF FEMUR FRACTURE Left 04/24/2017   Procedure: OPEN REDUCTION INTERNAL FIXATION (ORIF) DISTAL FEMUR FRACTURE;  Surgeon: [name], MD;  Location: [place];  Service: Orthopedics;  Laterality: Left;    Family History  Problem Relation Age of Onset   Hypertension Mother    Breast cancer Mother    Hypertension Sister    Diabetes Sister     Social History Reviewed with no changes to be made today.   Outpatient Medications Prior to Visit  Medication Sig Dispense Refill   amLODipine (NORVASC) 10 MG tablet Take 1 tablet (10 mg total) by mouth daily. To lower blood pressure 90 tablet 1   atenolol (TENORMIN) 100 MG tablet Take 1 tablet (100 mg total) by mouth daily. 90 tablet 1   Blood Glucose Monitoring Suppl (CONTOUR MONITOR) w/Device KIT 1 each by Does not apply route 2 (two) times daily. 1 kit 0   buPROPion (WELLBUTRIN SR) 150 MG 12 hr tablet Take 1 tablet or  150 mg by mouth once a day for three days, then 150 mg twice daily thereafter 60 tablet 2   carboxymethylcellulose (REFRESH PLUS) 0.5 % SOLN Place 1 drop into both eyes daily as needed (dry eyes).     ciclopirox (PENLAC) 8 % solution Apply topically.     glipiZIDE (GLUCOTROL XL) 5 MG 24 hr tablet TAKE 1 TABLET BY MOUTH EVERY DAY WITH BREAKFAST OR BEFORE THE FIRST MEAL OF THE DAY TO LOWER BLOOD SUGAR 90 tablet 1   glucose blood (CONTOUR NEXT TEST) test strip Use as instructed 100 strip 2   lisinopril (ZESTRIL) 10 MG tablet Take 1 tablet (10 mg total) by mouth daily. 90 tablet 3   magnesium citrate SOLN Take 296 mLs (1 Bottle total) by mouth once as needed for severe constipation. 195 mL    Microlet Lancets MISC USE TWICE DAILY 100 each 11   Multiple Vitamins-Minerals (WOMENS 50+ MULTI VITAMIN/MIN) TABS Take 1 tablet by mouth daily.      polyethylene glycol (MIRALAX / GLYCOLAX) 17 g packet Take 17 g by mouth daily as needed. Mixed into 16 or more ounces of fluid 30 each 6   rosuvastatin (CRESTOR) 10 MG tablet Take 1 tablet (10 mg total) by mouth daily. To lower cholesterol 90 tablet 3   senna-docusate (SENOKOT-S) 8.6-50 MG tablet Take 1 tablet by mouth 2 (two) times daily.     metFORMIN (GLUCOPHAGE) 1000 MG tablet Take 1 tablet (1,000 mg total) by mouth 2 (two) times daily with a meal. 180 tablet 0   No facility-administered medications prior to visit.    Allergies  Allergen Reactions   Penicillins Nausea Only       Objective:    BP 124/71   Pulse 67   Ht _0  (1.6 m)   Wt 190 lb 8 oz (86.4 kg)  SpO2 100%   BMI 33.75 kg/m  Wt Readings from Last 3 Encounters:  09/29/20 190 lb 8 oz (86.4 kg)  05/06/20 201 lb (91.2 kg)  12/31/19 193 lb (87.5 kg)    Physical Exam Vitals and nursing note reviewed.  Constitutional:      Appearance: She is well-developed.  HENT:     Head: Normocephalic and atraumatic.  Cardiovascular:     Rate and Rhythm: Normal rate and regular rhythm.     Heart sounds: Normal heart sounds. No murmur heard.   No friction rub. No gallop.  Pulmonary:     Effort: Pulmonary effort is normal. No tachypnea or respiratory distress.     Breath sounds: Normal breath sounds. No decreased breath sounds, wheezing, rhonchi or rales.  Chest:     Chest wall: No tenderness.  Abdominal:     General: Bowel sounds are normal.     Palpations: Abdomen is soft.  Musculoskeletal:        General: Normal range of motion.     Cervical back: Normal range of motion.  Skin:    General: Skin is warm and dry.  Neurological:     Mental Status: She is alert and oriented to person, place, and time.     Coordination: Coordination normal.  Psychiatric:        Behavior: Behavior normal. Behavior is cooperative.        Thought Content: Thought content normal.        Judgment: Judgment normal.         Patient  has been counseled extensively about nutrition and exercise as well as the importance of adherence with medications and regular follow-up. The patient was given clear instructions to go to ER or return to medical center if symptoms don't improve, worsen or new problems develop. The patient verbalized understanding.   Follow-up: Return in about 4 weeks (around 10/27/2020) for [place]. See me in 3 months.   Gildardo Pounds, FNP-BC [employer] and [place], [place]   09/29/2020, 7:15 PM

## 2020-09-30 ENCOUNTER — Telehealth: Payer: Self-pay | Admitting: Nurse Practitioner

## 2020-09-30 LAB — CMP14+EGFR
ALT: 21 IU/L (ref 0–32)
AST: 15 IU/L (ref 0–40)
Albumin/Globulin Ratio: 2.1 (ref 1.2–2.2)
Albumin: 4.5 g/dL (ref 3.8–4.8)
Alkaline Phosphatase: 113 IU/L (ref 44–121)
BUN/Creatinine Ratio: 12 (ref 12–28)
BUN: 11 mg/dL (ref 8–27)
Bilirubin Total: 0.3 mg/dL (ref 0.0–1.2)
CO2: 23 mmol/L (ref 20–29)
Calcium: 10.7 mg/dL — ABNORMAL HIGH (ref 8.7–10.3)
Chloride: 100 mmol/L (ref 96–106)
Creatinine, Ser: 0.93 mg/dL (ref 0.57–1.00)
Globulin, Total: 2.1 g/dL (ref 1.5–4.5)
Glucose: 275 mg/dL — ABNORMAL HIGH (ref 65–99)
Potassium: 3.8 mmol/L (ref 3.5–5.2)
Sodium: 140 mmol/L (ref 134–144)
Total Protein: 6.6 g/dL (ref 6.0–8.5)
eGFR: 69 mL/min/{1.73_m2} (ref >59)

## 2020-09-30 LAB — HCV INTERPRETATION

## 2020-09-30 LAB — LIPID PANEL
Chol/HDL Ratio: 2.4 ratio (ref 0.0–4.4)
Cholesterol, Total: 78 mg/dL — ABNORMAL LOW (ref 100–199)
HDL: 32 mg/dL — ABNORMAL LOW (ref >39)
LDL Chol Calc (NIH): 22 mg/dL (ref 0–99)
Triglycerides: 134 mg/dL (ref 0–149)
VLDL Cholesterol Cal: 24 mg/dL (ref 5–40)

## 2020-09-30 LAB — HCV AB W REFLEX TO QUANT PCR: HCV Ab: <0.1 s/co ratio (ref 0.0–0.9)

## 2020-09-30 NOTE — Telephone Encounter (Signed)
Pt stated that the Rx for insulin glargine (LANTUS SOLOSTAR) 100 UNIT/ML Solostar Pen was too expensive in cost at $150 and the needles were $50 / Pts insurance advised her to get Lantus due to lower cost / the solostar cost more / please advise when new Rx is sent

## 2020-09-30 NOTE — Telephone Encounter (Signed)
Copied from [place] [id number]. Topic: General - Other >> Sep 30, 2020  8:03 AM Christine Rocha A wrote: Reason for CRM: Patient called in to inform Geryl Rankins that she can not afford the medication for what they are trying to charge her. Would like a call back please at Ph# [phone]

## 2020-10-06 NOTE — Telephone Encounter (Signed)
Called Pt and let a vm to return call on clarification of message.

## 2020-10-06 NOTE — Telephone Encounter (Signed)
This is the clarification I got. Hope this helps.

## 2020-10-06 NOTE — Telephone Encounter (Signed)
Christine Rocha can you assist with below and see if her insurance covers another insulin? Thanks so much

## 2020-10-06 NOTE — Telephone Encounter (Signed)
Pt states her insurance asked her to switch   Lantus glargine.  Pt states they asked her to switch to something else, but she really does not know which is was. Pt would like something that is all together and that her insurance will pay. Pharmacy states this is only option, as there is not another lantus except vial. Pt will need to call her insurance, or nurse can call. Pt states she has not had any insulin, and she has not checked her sugar since she saw zelda last. Tried to reach out to pt, but no answer.

## 2020-10-07 ENCOUNTER — Other Ambulatory Visit: Payer: Self-pay

## 2020-10-07 NOTE — Telephone Encounter (Signed)
Thanks so much Kelly!

## 2020-10-08 NOTE — Telephone Encounter (Signed)
Patient may transfer Lantus to [facility] where her copay can be charged to an account.  I can also assist in helping patient enroll in the [facility] Patient Assistance Program where, if approved, patient will receive Lantus at no charge through the end of the year.  Unfortunately, because patient is in her donut hole, even if MD changes therapy copay will still be high through the end of the year.

## 2020-10-08 NOTE — Telephone Encounter (Signed)
Please call Kyomi back, pt called but Claiborne Billings was in room with pt [phone]

## 2020-10-14 ENCOUNTER — Telehealth: Payer: Self-pay

## 2020-10-14 ENCOUNTER — Other Ambulatory Visit: Payer: Self-pay

## 2020-10-14 ENCOUNTER — Other Ambulatory Visit: Payer: Self-pay | Admitting: Nurse Practitioner

## 2020-10-14 ENCOUNTER — Encounter: Payer: Self-pay | Admitting: Dietician

## 2020-10-14 ENCOUNTER — Encounter: Payer: Medicare Other | Attending: Nurse Practitioner | Admitting: Dietician

## 2020-10-14 DIAGNOSIS — E1165 Type 2 diabetes mellitus with hyperglycemia: Secondary | ICD-10-CM | POA: Diagnosis not present

## 2020-10-14 MED ORDER — INSULIN SYRINGES (DISPOSABLE) U-100 0.5 ML MISC: 6 refills | Status: DC

## 2020-10-14 MED ORDER — INSULIN NPH ISOPHANE & REGULAR (70-30) 100 UNIT/ML ~~LOC~~ SUSP: 5.0000 [IU] | Freq: Two times a day (BID) | SUBCUTANEOUS | 11 refills | Status: DC

## 2020-10-14 NOTE — Patient Instructions (Addendum)
   Your diabetes medications include: Glipizide Metformin 1000 mg 20 units of Insulin each night  Recommend choose water rather than regular soda.

## 2020-10-14 NOTE — Progress Notes (Signed)
Diabetes Self-Management Education  Visit Type: First/Initial  Appt. Start Time: 0805 Appt. End Time: 0935  10/14/2020  [name], identified by name and date of birth, is a 65 y.o. female with a diagnosis of Diabetes: Type 2.   ASSESSMENT Patient is here today alone. She notes depression related to worry about money, bills, and food. Her and her husband rely on public transportation. She states she was told to stop the glimepiride and Metformin and start Lantus but she could not afford this.  She is not taking any of her medications.  Financial assistance: [employer] [place], [employer], [employer] program options) along with [employer] number provided. Filled out [employer] patient assistance program and sent with patient for completion by prescriber.    Blood glucose: 331 10/14/2020 in office 557 10/13/2020 at home  Has not started Lantus due to expense.  Educated patient on insulin injection using an insulin pen.  History includes:  Type 2 Diabetes, HTN, arthritis Medications include:  (Not taking) Glipizide, Metformin, Lantus 20 units q HS, amlodipine, atenolol, lisinopril, MVI Labs noted:  A1C 12.8%, eGFR 65, Cholesterol 78, HDL 32, LDL 22, Triglycerides 134 09/29/2020  Patient lives with her husband.  They share the shopping and cooking.  They eat out most of the time (fast food). They rely on public transportation. She states that some vegetables (broccoli, cauliflower, and squash s) don't agree with her - make her want to vomit.  She enjoys cabbage and turnip greens.   She is on disability.  Walks with a cane.   She smokes and has tried to quit without success.  Height '5\' 3"'  (1.6 m), weight 190 lb (86.2 kg). Body mass index is 33.66 kg/m.   Diabetes Self-Management Education - 10/14/20 0837       Visit Information   Visit Type First/Initial      Initial Visit   Diabetes Type Type 2    Are you  currently following a meal plan? No    Are you taking your medications as prescribed? No      Health Coping   How would you rate your overall health? Fair      Psychosocial Assessment   Patient Belief/Attitude about Diabetes Afraid    Self-care barriers Lack of transportation;Lack of material resources    Self-management support Doctor's office;CDE visits    Other persons present Patient    Patient Concerns Nutrition/Meal planning;Medication;Problem Solving    Special Needs Simplified materials    Preferred Learning Style No preference indicated    Learning Readiness Ready    How often do you need to have someone help you when you read instructions, pamphlets, or other written materials from your doctor or pharmacy? 3 - Sometimes    What is the last grade level you completed in school? 10      Pre-Education Assessment   Patient understands the diabetes disease and treatment process. Needs Instruction    Patient understands incorporating nutritional management into lifestyle. Needs Instruction    Patient undertands incorporating physical activity into lifestyle. Needs Instruction    Patient understands using medications safely. Needs Instruction    Patient understands monitoring blood glucose, interpreting and using results Needs Instruction    Patient understands prevention, detection, and treatment of acute complications. Needs Instruction    Patient understands prevention, detection, and treatment of chronic complications. Needs Instruction    Patient understands how to develop strategies to address psychosocial issues. Needs  Instruction    Patient understands how to develop strategies to promote health/change behavior. Needs Instruction      Complications   Last HgB A1C per patient/outside source 12.8 %   09/29/2020   How often do you check your blood sugar? 1-2 times/day    Fasting Blood glucose range (mg/dL) >200   331 now without breakfast   Postprandial Blood glucose range  (mg/dL) >200   551 last night   Have you had a dilated eye exam in the past 12 months? Yes    Have you had a dental exam in the past 12 months? Yes    Are you checking your feet? Yes    How many days per week are you checking your feet? 1      Dietary Intake   Breakfast canned meat, crackers, fruit cocktail    Lunch canned meat, crackers, fruit cocktail OR sandwich    Dinner Canned spaghetti and meatballs OR beef-a-roni OR hamburger, fries, regular soda    Beverage(s) water, regular soda (1-4 bottles daily), milk, chocolate milk      Exercise   Exercise Type ADL's      Patient Education   Previous Diabetes Education No    Disease state  Definition of diabetes, type 1 and 2, and the diagnosis of diabetes    Nutrition management  Meal options for control of blood glucose level and chronic complications.    Medications Taught/reviewed insulin injection, site rotation, insulin storage and needle disposal.;Reviewed patients medication for diabetes, action, purpose, timing of dose and side effects.;Other (comment)   financial assistance   Monitoring Identified appropriate SMBG and/or A1C goals.    Psychosocial adjustment Role of stress on diabetes;Worked with patient to identify barriers to care and solutions;Identified and addressed patients feelings and concerns about diabetes      Individualized Goals (developed by patient)   Nutrition General guidelines for healthy choices and portions discussed    Medications take my medication as prescribed    Monitoring  test my blood glucose as discussed    Reducing Risk Other (comment);stop smoking   stop sugar containing beverages     Post-Education Assessment   Patient understands the diabetes disease and treatment process. Needs Review    Patient understands incorporating nutritional management into lifestyle. Needs Review    Patient undertands incorporating physical activity into lifestyle. Needs Review    Patient understands using medications  safely. Needs Review    Patient understands monitoring blood glucose, interpreting and using results Needs Review    Patient understands prevention, detection, and treatment of acute complications. Needs Review    Patient understands prevention, detection, and treatment of chronic complications. Needs Review    Patient understands how to develop strategies to address psychosocial issues. Needs Review    Patient understands how to develop strategies to promote health/change behavior. Needs Review      Outcomes   Expected Outcomes Other (comment)   demonstrated interest in learning but increased barriers   Future DMSE 4-6 wks    Program Status Not Completed             Individualized Plan for Diabetes Self-Management Training:   Learning Objective:  Patient will have a greater understanding of diabetes self-management. Patient education plan is to attend individual and/or group sessions per assessed needs and concerns.   Plan:   Patient Instructions    Your diabetes medications include: Glipizide Metformin 1000 mg 20 units of Insulin each night  Recommend choose water rather than  regular soda.  Expected Outcomes:  Other (comment) (demonstrated interest in learning but increased barriers)  Education material provided:   If problems or questions, patient to contact team via:  Phone  Future DSME appointment: 4-6 wks

## 2020-10-14 NOTE — Telephone Encounter (Signed)
Patient has written in [place] on application '? Receives food stamps'.  This needs to be an actual estimated #, application will not be processed without an amount written in field which will have to match up to what is pulled via a soft credit check or application will be denied.  Asked that patient return my call with an estimated annual or monthly amount.

## 2020-10-15 ENCOUNTER — Other Ambulatory Visit: Payer: Self-pay

## 2020-10-16 ENCOUNTER — Other Ambulatory Visit: Payer: Self-pay | Admitting: Nurse Practitioner

## 2020-10-16 ENCOUNTER — Telehealth: Payer: Self-pay | Admitting: Dietician

## 2020-10-16 DIAGNOSIS — E1165 Type 2 diabetes mellitus with hyperglycemia: Secondary | ICD-10-CM

## 2020-10-16 NOTE — Telephone Encounter (Signed)
Noted that Nurse Practitioner sent in a prescription for insulin to [facility] and also that financial form was incomplete.  Called patient who was unavailable. Left a message regarding the insulin and asked patient to call for education and/or questions.  Antonieta Iba, RD, LDN, CDCES

## 2020-10-21 ENCOUNTER — Telehealth: Payer: Self-pay | Admitting: Nurse Practitioner

## 2020-10-21 NOTE — Telephone Encounter (Signed)
Copied from [place] [id number]. Topic: General - Call Back - No Documentation >> Oct 21, 2020  1:19 PM Christine Rocha wrote: Reason for CRM: Pt has questions for clinic concerning her lab work, wants a call back from nurse.  [phone]

## 2020-10-23 ENCOUNTER — Other Ambulatory Visit: Payer: Self-pay

## 2020-10-23 ENCOUNTER — Telehealth: Payer: Self-pay | Admitting: Dietician

## 2020-10-23 NOTE — Telephone Encounter (Signed)
Left vm to return call to office

## 2020-10-27 ENCOUNTER — Ambulatory Visit: Payer: Medicare Other | Attending: Family Medicine | Admitting: Pharmacist

## 2020-10-27 ENCOUNTER — Other Ambulatory Visit: Payer: Self-pay

## 2020-10-27 ENCOUNTER — Encounter: Payer: Self-pay | Admitting: Pharmacist

## 2020-10-27 DIAGNOSIS — E1165 Type 2 diabetes mellitus with hyperglycemia: Secondary | ICD-10-CM | POA: Diagnosis not present

## 2020-10-27 MED ORDER — BD PEN NEEDLE MINI U/F 31G X 5 MM MISC
1 refills | Status: DC
  Filled 2020-10-27: qty 100, 90d supply, fill #0

## 2020-10-27 MED ORDER — LANTUS SOLOSTAR 100 UNIT/ML ~~LOC~~ SOPN
15.0000 [IU] | PEN_INJECTOR | Freq: Every day | SUBCUTANEOUS | 99 refills | Status: DC
Start: 2020-10-27 — End: 2020-11-12
  Filled 2020-10-27: qty 3, 20d supply, fill #0

## 2020-10-27 NOTE — Patient Instructions (Signed)
Thank you for coming to see me today. Please do the following:  Continue metformin. Take 1 full pill twice daily.  Start Lantus. Start taking 15 units once a day before bedtime.  Stop glipizide.  Continue checking blood sugars at home.  Continue making the lifestyle changes we've discussed together during our visit. Diet and exercise play a significant role in improving your blood sugars.  Follow-up with me in 2-3 weeks.    Hypoglycemia or low blood sugar:   Low blood sugar can happen quickly and may become an emergency if not treated right away.   While this shouldn't happen often, it can be brought upon if you skip a meal or do not eat enough. Also, if your insulin or other diabetes medications are dosed too high, this can cause your blood sugar to go to low.   Warning signs of low blood sugar include: Feeling shaky or dizzy Feeling weak or tired  Excessive hunger Feeling anxious or upset  Sweating even when you aren't exercising  What to do if I experience low blood sugar? Check your blood sugar with your meter. If lower than 70, proceed to step 2.  Treat with 3-4 glucose tablets or 3 packets of regular sugar. If these aren't around, you can try hard candy. Yet another option would be to drink 4 ounces of fruit juice or 6 ounces of REGULAR soda.  Re-check your sugar in 15 minutes. If it is still below 70, do what you did in step 2 again. If has come back up, go ahead and eat a snack or small meal at this time.

## 2020-10-27 NOTE — Progress Notes (Signed)
S:    PCP: Zelda   Patient arrives in [employer].  Presents for diabetes evaluation, education, and management. Patient was referred and last seen by Primary Care Provider (Ms. Army Melia) on 09/29/2020. Per last PCP visit, patient was supposed to start Lantus 20 units daily, though due to financial concerns was unable to pick this up until today.  Family/Social History:  Family Hx: hypertension (mother, sister); diabetes (sister) Social Hx: current smoker, denies any smokeless tobacco use  Insurance coverage/medication affordability: BCBS Medicare  Medication adherence is suboptimal - patient appears confused about her medications - refers to them by pill size and colors. Unable to recall medication names.      Current diabetes medications include:  - Metformin 1000 mg BID (taking 500 mg BID) - Glipizide XL 5 mg daily (unable to verify due to no bottles today though last Rx was not picked up at our pharmacy) - Lantus 20 units daily(not taking) Current hypertension medications include:  - Amlodipine 10 mg daily - Atenolol 100 mg daily - Lisinopril 10 mg daily Current hyperlipidemia medications include:  - Rosuvastatin 10 mg daily  Patient reports one hypoglycemic event yesterday. Treated successfully.   Patient reported dietary habits:  - Eats 2 meals/day - limited due to financial constraints.  - Reports she is not a big breakfast person. Encouraged patient to eat 3 meals/day and to not go long without meals due to risk of hypoglycemic events. Patient states when she does eat breakfast she eats an egg and bologna sandwich.  - For other meals reports eating boiled chicken and greens. Reports they used to eat out a lot though now only once a month.   Patient-reported exercise habits: walks dog for approximately 2 hours daily   Patient reports nocturia (nighttime urination) about 3-4 times a night.  Patient reports neuropathy (nerve pain) occasionally. Patient reports visual  changes - reports recent diabetic eye exam.  Patient denies self foot exams - reports she is seen by podiatry.   O:   Lab Results  Component Value Date   HGBA1C 12.8 (A) 09/29/2020   Lipid Panel     Component Value Date/Time   CHOL 78 (L) 09/29/2020 1604   TRIG 134 09/29/2020 1604   HDL 32 (L) 09/29/2020 1604   CHOLHDL 2.4 09/29/2020 1604   CHOLHDL 3.8 02/25/2016 1737   VLDL 33 (H) 02/25/2016 1737   LDLCALC 22 09/29/2020 1604   Home fasting blood sugars: no meter today, reports yesterday blood sugar 220 AM, 190 PM    Clinical Atherosclerotic Cardiovascular Disease (ASCVD): No  The ASCVD Risk score (Arnett DK, et al., 2019) failed to calculate for the following reasons:   The valid total cholesterol range is 130 to 320 mg/dL   A/P: Diabetes goals: FBG 80-130 mg/dL, 2H-PPBG < 180 mg/dL, and A1c < 7%. Diabetes longstanding currently not at goal. Patient is able to verbalize appropriate hypoglycemia management plan. Medication adherence appears suboptimal. Will stop glipizide and have her start Lantus today. She has been instructed to take 1000 mg BID of metformin as prescribed.  - Discontinue Glipizide 5 mg daily  - Start Lantus Solostar Pen 15 units daily - Continue Metformin 1000 mg BID (counseled patient to take full tablet BID) - Continue to check blood sugars twice daily at home - bring meter to next visit - Advised patient to bring all medication bottles (except insulin) to next visit - Extensively discussed pathophysiology of diabetes, recommended lifestyle interventions, dietary effects on blood sugar  control - Counseled on s/sx of and management of hypoglycemia - Next A1C anticipated 12/2020  Follow up with Lurena Joiner on 11/12/2020 Follow up with Ms. Army Melia on 12/29/2020 - will check A1c at that time  Written patient instructions provided.  Total time in face to face counseling 30 minutes.    Patient seen with:  Darcel Smalling, Student Pharmacist [employer], PharmD, [place], [place] [phone]

## 2020-10-30 ENCOUNTER — Encounter: Payer: Self-pay | Admitting: Physician Assistant

## 2020-10-30 ENCOUNTER — Ambulatory Visit: Payer: Medicare Other | Attending: Physician Assistant | Admitting: Physician Assistant

## 2020-10-30 ENCOUNTER — Other Ambulatory Visit: Payer: Self-pay

## 2020-10-30 VITALS — BP 122/76 | HR 84 | Temp 98.7°F | Resp 18 | Ht 63.0 in | Wt 187.0 lb

## 2020-10-30 DIAGNOSIS — E1165 Type 2 diabetes mellitus with hyperglycemia: Secondary | ICD-10-CM

## 2020-10-30 DIAGNOSIS — B372 Candidiasis of skin and nail: Secondary | ICD-10-CM

## 2020-10-30 MED ORDER — NYSTATIN-TRIAMCINOLONE 100000-0.1 UNIT/GM-% EX OINT: 1.0000 "application " | TOPICAL_OINTMENT | Freq: Two times a day (BID) | CUTANEOUS | 0 refills | Status: DC

## 2020-10-30 NOTE — Progress Notes (Signed)
Established Patient Office Visit  Subjective:  Patient ID: Christine Rocha, female    DOB: Oct 21, 1955  Age: 65 y.o. MRN: [id number]  CC:  Chief Complaint  Patient presents with   Breast Problem    Chaffing under breast    HPI [name] states that she has been having a painful rash under both of her breasts.  Reports it has been present for the past 2 weeks and seems to be getting worse.  Reports that she has not a thing for relief.  Past Medical History:  Diagnosis Date   Arthritis    Right Knee   Diabetes mellitus without complication ([place])    Hypertension    Seizures ([place])     Past Surgical History:  Procedure Laterality Date   COLONOSCOPY  2021   ORIF FEMUR FRACTURE Left 04/24/2017   Procedure: OPEN REDUCTION INTERNAL FIXATION (ORIF) DISTAL FEMUR FRACTURE;  Surgeon: [name], MD;  Location: [facility];  Service: Orthopedics;  Laterality: Left;    Family History  Problem Relation Age of Onset   Hypertension Mother    Breast cancer Mother    Hypertension Sister    Diabetes Sister     Social History   Socioeconomic History   Marital status: Married    Spouse name: Not on file   Number of children: Not on file   Years of education: Not on file   Highest education level: Not on file  Occupational History   Not on file  Tobacco Use   Smoking status: Every Day    Packs/day: 0.25    Years: 49.00    Pack years: 12.25    Types: Cigarettes   Smokeless tobacco: Never  Vaping Use   Vaping Use: Never used  Substance and Sexual Activity   Alcohol use: No   Drug use: No   Sexual activity: Yes    Birth control/protection: None  Other Topics Concern   Not on file  Social History Narrative   Not on file   Social Determinants of Health   Financial Resource Strain: Not on file  Food Insecurity: Food Insecurity Present   Worried About Charity fundraiser in the Last Year: Often true   Arboriculturist in the Last Year: Sometimes true  Transportation Needs:  Not on file  Physical Activity: Not on file  Stress: Not on file  Social Connections: Not on file  Intimate Partner Violence: Not on file    Outpatient Medications Prior to Visit  Medication Sig Dispense Refill   amLODipine (NORVASC) 10 MG tablet Take 1 tablet (10 mg total) by mouth daily. To lower blood pressure 90 tablet 1   atenolol (TENORMIN) 100 MG tablet Take 1 tablet (100 mg total) by mouth daily. 90 tablet 1   Blood Glucose Monitoring Suppl (CONTOUR MONITOR) w/Device KIT 1 each by Does not apply route 2 (two) times daily. 1 kit 0   buPROPion (WELLBUTRIN SR) 150 MG 12 hr tablet Take 1 tablet or  150 mg by mouth once a day for three days, then 150 mg twice daily thereafter (Patient taking differently: Take 1 tablet or  150 mg by mouth once a day for three days, then 150 mg twice daily thereafter) 60 tablet 2   carboxymethylcellulose (REFRESH PLUS) 0.5 % SOLN Place 1 drop into both eyes daily as needed (dry eyes).     ciclopirox (PENLAC) 8 % solution Apply topically.     glipiZIDE (GLUCOTROL XL) 5 MG  24 hr tablet TAKE 1 TABLET BY MOUTH EVERY DAY WITH BREAKFAST OR BEFORE THE FIRST MEAL OF THE DAY TO LOWER BLOOD SUGAR 90 tablet 1   glucose blood (CONTOUR NEXT TEST) test strip Use as instructed 100 strip 2   insulin glargine (LANTUS SOLOSTAR) 100 UNIT/ML Solostar Pen Inject 15 Units into the skin daily. 15 mL PRN   Insulin Pen Needle (B-D UF III MINI PEN NEEDLES) 31G X 5 MM MISC Use as instructed. Inject into the skin once in the morning. 100 each 1   Insulin Syringes, Disposable, U-100 0.5 ML MISC Use as instructed. Inject into the skin twice daily 100 each 6   lisinopril (ZESTRIL) 10 MG tablet Take 1 tablet (10 mg total) by mouth daily. 90 tablet 3   magnesium citrate SOLN Take 296 mLs (1 Bottle total) by mouth once as needed for severe constipation. 195 mL    metFORMIN (GLUCOPHAGE) 1000 MG tablet TAKE 1 TABLET(1000 MG) BY MOUTH TWICE DAILY WITH A MEAL 180 tablet 0   Microlet Lancets MISC  USE TWICE DAILY 100 each 11   Multiple Vitamins-Minerals (WOMENS 50+ MULTI VITAMIN/MIN) TABS Take 1 tablet by mouth daily.     polyethylene glycol (MIRALAX / GLYCOLAX) 17 g packet Take 17 g by mouth daily as needed. Mixed into 16 or more ounces of fluid 30 each 6   rosuvastatin (CRESTOR) 10 MG tablet Take 1 tablet (10 mg total) by mouth daily. To lower cholesterol 90 tablet 3   senna-docusate (SENOKOT-S) 8.6-50 MG tablet Take 1 tablet by mouth 2 (two) times daily.     No facility-administered medications prior to visit.    Allergies  Allergen Reactions   Penicillins Nausea Only    ROS Review of Systems  Constitutional:  Negative for chills and fever.  HENT: Negative.    Eyes: Negative.   Respiratory:  Negative for shortness of breath.   Cardiovascular:  Negative for chest pain.  Gastrointestinal: Negative.   Endocrine: Negative.   Genitourinary: Negative.   Musculoskeletal: Negative.   Skin:  Positive for rash.  Allergic/Immunologic: Negative.   Neurological: Negative.   Hematological: Negative.   Psychiatric/Behavioral: Negative.       Objective:    Physical Exam Vitals and nursing note reviewed.  Constitutional:      General: She is not in acute distress.    Appearance: Normal appearance. She is obese.  HENT:     Head: Normocephalic and atraumatic.     Right Ear: External ear normal.     Left Ear: External ear normal.     Nose: Nose normal.     Mouth/Throat:     Mouth: Mucous membranes are moist.     Pharynx: Oropharynx is clear.  Eyes:     Extraocular Movements: Extraocular movements intact.     Conjunctiva/sclera: Conjunctivae normal.     Pupils: Pupils are equal, round, and reactive to light.  Cardiovascular:     Rate and Rhythm: Normal rate and regular rhythm.     Pulses: Normal pulses.     Heart sounds: Normal heart sounds.  Pulmonary:     Effort: Pulmonary effort is normal.     Breath sounds: Normal breath sounds.  Musculoskeletal:        General:  Normal range of motion.     Cervical back: Normal range of motion and neck supple.  Skin:    Comments: See photo  Neurological:     General: No focal deficit present.     Mental  Status: She is alert and oriented to person, place, and time.  Psychiatric:        Mood and Affect: Mood normal.        Behavior: Behavior normal.        Thought Content: Thought content normal.        Judgment: Judgment normal.     BP 122/76 (BP Location: Left Arm, Patient Position: Sitting, Cuff Size: Normal)   Pulse 84   Temp 98.7 F (37.1 C) (Oral)   Resp 18   Ht _0  (1.6 m)   Wt 187 lb (84.8 kg)   SpO2 98%   BMI 33.13 kg/m  Wt Readings from Last 3 Encounters:  10/30/20 187 lb (84.8 kg)  10/14/20 190 lb (86.2 kg)  09/29/20 190 lb 8 oz (86.4 kg)     Health Maintenance Due  Topic Date Due   Pneumococcal Vaccine 55-21 Years old (1 - PCV) Never done   TETANUS/TDAP  Never done   Zoster Vaccines- Shingrix (1 of 2) Never done   COVID-19 Vaccine (3 - Pfizer risk series) 07/20/2019   FOOT EXAM  09/18/2020    There are no preventive care reminders to display for this patient.  Lab Results  Component Value Date   TSH 1.230 09/19/2019   Lab Results  Component Value Date   WBC 6.6 05/06/2020   HGB 13.6 05/06/2020   HCT 42.2 05/06/2020   MCV 90.4 05/06/2020   PLT 268 05/06/2020   Lab Results  Component Value Date   NA 140 09/29/2020   K 3.8 09/29/2020   CO2 23 09/29/2020   GLUCOSE 275 (H) 09/29/2020   BUN 11 09/29/2020   CREATININE 0.93 09/29/2020   BILITOT 0.3 09/29/2020   ALKPHOS 113 09/29/2020   AST 15 09/29/2020   ALT 21 09/29/2020   PROT 6.6 09/29/2020   ALBUMIN 4.5 09/29/2020   CALCIUM 10.7 (H) 09/29/2020   ANIONGAP 9 05/06/2020   EGFR 69 09/29/2020   Lab Results  Component Value Date   CHOL 78 (L) 09/29/2020   Lab Results  Component Value Date   HDL 32 (L) 09/29/2020   Lab Results  Component Value Date   LDLCALC 22 09/29/2020   Lab Results  Component Value  Date   TRIG 134 09/29/2020   Lab Results  Component Value Date   CHOLHDL 2.4 09/29/2020   Lab Results  Component Value Date   HGBA1C 12.8 (A) 09/29/2020      Assessment & Plan:   Problem List Items Addressed This Visit       Endocrine   Diabetes ([place])   Other Visit Diagnoses     Yeast infection of the skin    -  Primary   Relevant Medications   nystatin-triamcinolone ointment (MYCOLOG)       Meds ordered this encounter  Medications   nystatin-triamcinolone ointment (MYCOLOG)    Sig: Apply 1 application topically 2 (two) times daily.    Dispense:  60 g    Refill:  0    Order Specific Question:   Supervising Provider    Answer:   WRIGHT, PATRICK E [[id number]]   1. Yeast infection of the skin Patient education given on supportive care, patient to follow-up with this provider at the beginning of next week.  Red flags given for prompt reevaluation. - nystatin-triamcinolone ointment (MYCOLOG); Apply 1 application topically 2 (two) times daily.  Dispense: 60 g; Refill: 0  2. Type 2 diabetes mellitus with hyperglycemia, without long-term current  use of insulin ([place])    I have reviewed the patient's medical history (PMH, PSH, Social History, Family History, Medications, and allergies) , and have been updated if relevant. I spent 20 minutes reviewing chart and  face to face time with patient.    Follow-up: Return in about 4 days (around 11/03/2020).    Loraine Grip Mayers, PA-C

## 2020-10-30 NOTE — Progress Notes (Signed)
Patient reports pain under both breast from rash that began 3 weeks. Patient has eaten and taken medication today. Patient has used no medication on breast concern. Patient shares Hx of concern presenting last year and being treated with cream.

## 2020-10-30 NOTE — Patient Instructions (Signed)
You are going to use Mycolog on the affected area twice a day until it is resolved.   Once this has resolved, it is very important that you continue to keep the area clean and dry, use baby powder or Goldbond powder in the area, make sure that you are very dry after getting out of the shower.  You will follow-up with me on Monday for further evaluation.  Kennieth Rad, PA-C Physician Assistant [facility] [email or web address]   Skin Yeast Infection A skin yeast infection is a condition in which there is an overgrowth of yeast (Candida) that normally lives on the skin. This condition usually occurs in areas of the skin that are constantly warm and moist, such as the skin under the breasts or armpits, or in the groin and other body folds. What are the causes? This condition is caused by a change in the normal balance of the yeast that live on the skin. What increases the risk? You are more likely to develop this condition if you: Are obese. Are pregnant. Are 65 years of age or older. Wear tight clothing. Have any of the following conditions: Diabetes. Malnutrition. A weak body defense system (immune system). Take medicines such as: Birth control pills. Antibiotics. Steroid medicines. What are the signs or symptoms? The most common symptom of this condition is itchiness in the affected area. Other symptoms include: A red, swollen area of the skin. Bumps on the skin. How is this diagnosed? This condition is diagnosed with a medical history and physical exam. Your health care provider may check for yeast by taking scrapings of the skin to be viewed under a microscope. How is this treated? This condition is treated with medicine. Medicines may be prescribed or available over the counter. The medicines may be: Taken by mouth (orally). Applied as a cream or powder to your skin. Follow these instructions at home:  Take or apply  over-the-counter and prescription medicines only as told by your health care provider. Maintain a healthy weight. If you need help losing weight, talk with your health care provider. Keep your skin clean and dry. Wear loose-fitting clothing. If you have diabetes, keep your blood sugar under control. Keep all follow-up visits. This is important. Contact a health care provider if: Your symptoms go away and then come back. Your symptoms do not get better with treatment. Your symptoms get worse. Your rash spreads. You have a fever or chills. You have new symptoms. You have new warmth or redness of your skin. Your rash is painful or bleeding. Summary A skin yeast infection is a condition in which there is an overgrowth of yeast (Candida) that normally lives on the skin. Take or apply over-the-counter and prescription medicines only as told by your health care provider. Keep your skin clean and dry. Contact a health care provider if your symptoms do not get better with treatment. This information is not intended to replace advice given to you by your health care provider. Make sure you discuss any questions you have with your health care provider. Document Revised: 03/18/2020 Document Reviewed: 03/18/2020 Elsevier Patient Education  [place].

## 2020-10-31 ENCOUNTER — Ambulatory Visit (INDEPENDENT_AMBULATORY_CARE_PROVIDER_SITE_OTHER): Payer: Medicare Other | Admitting: Podiatry

## 2020-10-31 ENCOUNTER — Encounter: Payer: Self-pay | Admitting: Physician Assistant

## 2020-10-31 DIAGNOSIS — B351 Tinea unguium: Secondary | ICD-10-CM

## 2020-10-31 DIAGNOSIS — E1142 Type 2 diabetes mellitus with diabetic polyneuropathy: Secondary | ICD-10-CM | POA: Diagnosis not present

## 2020-10-31 DIAGNOSIS — M79676 Pain in unspecified toe(s): Secondary | ICD-10-CM

## 2020-10-31 DIAGNOSIS — L84 Corns and callosities: Secondary | ICD-10-CM | POA: Diagnosis not present

## 2020-11-03 ENCOUNTER — Telehealth: Payer: Self-pay | Admitting: *Deleted

## 2020-11-03 ENCOUNTER — Other Ambulatory Visit: Payer: Self-pay

## 2020-11-03 ENCOUNTER — Encounter: Payer: Self-pay | Admitting: Physician Assistant

## 2020-11-03 ENCOUNTER — Ambulatory Visit: Payer: Medicare Other | Attending: Physician Assistant | Admitting: Physician Assistant

## 2020-11-03 VITALS — BP 137/77 | HR 67 | Temp 98.8°F | Resp 18 | Ht 63.0 in | Wt 186.0 lb

## 2020-11-03 DIAGNOSIS — B372 Candidiasis of skin and nail: Secondary | ICD-10-CM | POA: Diagnosis not present

## 2020-11-03 DIAGNOSIS — Z716 Tobacco abuse counseling: Secondary | ICD-10-CM | POA: Diagnosis not present

## 2020-11-03 DIAGNOSIS — F17200 Nicotine dependence, unspecified, uncomplicated: Secondary | ICD-10-CM

## 2020-11-03 MED ORDER — NYSTATIN-TRIAMCINOLONE 100000-0.1 UNIT/GM-% EX OINT: 1.0000 "application " | TOPICAL_OINTMENT | Freq: Two times a day (BID) | CUTANEOUS | 0 refills | Status: DC

## 2020-11-03 NOTE — Progress Notes (Signed)
Established Patient Office Visit  Subjective:  Patient ID: Christine Rocha, female    DOB: 1955/04/02  Age: 65 y.o. MRN: [id number]  CC:  Chief Complaint  Patient presents with   Rash     HPI Christine Rocha states that she has been using the prescribed Mycolog with a much improvement rash under her breasts.  Reports that she also has been using it in her suprapubic area and in the middle of her back with relief of itching in those areas.  Reports that she is working on supportive care measures as well to help prevent future breakouts.  Does request assistance with smoking cessation.  Reports that she is smoking 10 cigarettes a day, states that she has been smoking for 50 years.   Past Medical History:  Diagnosis Date   Arthritis    Right Knee   Diabetes mellitus without complication ([place])    Hypertension    Seizures ([place])     Past Surgical History:  Procedure Laterality Date   COLONOSCOPY  2021   ORIF FEMUR FRACTURE Left 04/24/2017   Procedure: OPEN REDUCTION INTERNAL FIXATION (ORIF) DISTAL FEMUR FRACTURE;  Surgeon: [name], MD;  Location: [facility];  Service: Orthopedics;  Laterality: Left;    Family History  Problem Relation Age of Onset   Hypertension Mother    Breast cancer Mother    Hypertension Sister    Diabetes Sister     Social History   Socioeconomic History   Marital status: Married    Spouse name: Not on file   Number of children: Not on file   Years of education: Not on file   Highest education level: Not on file  Occupational History   Not on file  Tobacco Use   Smoking status: Every Day    Packs/day: 0.25    Years: 49.00    Pack years: 12.25    Types: Cigarettes   Smokeless tobacco: Never  Vaping Use   Vaping Use: Never used  Substance and Sexual Activity   Alcohol use: No   Drug use: No   Sexual activity: Yes    Birth control/protection: None  Other Topics Concern   Not on file  Social History Narrative   Not on file   Social  Determinants of Health   Financial Resource Strain: Not on file  Food Insecurity: Food Insecurity Present   Worried About Charity fundraiser in the Last Year: Often true   Arboriculturist in the Last Year: Sometimes true  Transportation Needs: Not on file  Physical Activity: Not on file  Stress: Not on file  Social Connections: Not on file  Intimate Partner Violence: Not on file    Outpatient Medications Prior to Visit  Medication Sig Dispense Refill   amLODipine (NORVASC) 10 MG tablet Take 1 tablet (10 mg total) by mouth daily. To lower blood pressure 90 tablet 1   atenolol (TENORMIN) 100 MG tablet Take 1 tablet (100 mg total) by mouth daily. 90 tablet 1   Blood Glucose Monitoring Suppl (CONTOUR MONITOR) w/Device KIT 1 each by Does not apply route 2 (two) times daily. 1 kit 0   buPROPion (WELLBUTRIN SR) 150 MG 12 hr tablet Take 1 tablet or  150 mg by mouth once a day for three days, then 150 mg twice daily thereafter (Patient taking differently: Take 1 tablet or  150 mg by mouth once a day for three days, then 150 mg twice daily thereafter) 60  tablet 2   carboxymethylcellulose (REFRESH PLUS) 0.5 % SOLN Place 1 drop into both eyes daily as needed (dry eyes).     ciclopirox (PENLAC) 8 % solution Apply topically.     glipiZIDE (GLUCOTROL XL) 5 MG 24 hr tablet TAKE 1 TABLET BY MOUTH EVERY DAY WITH BREAKFAST OR BEFORE THE FIRST MEAL OF THE DAY TO LOWER BLOOD SUGAR 90 tablet 1   glucose blood (CONTOUR NEXT TEST) test strip Use as instructed 100 strip 2   insulin glargine (LANTUS SOLOSTAR) 100 UNIT/ML Solostar Pen Inject 15 Units into the skin daily. 15 mL PRN   Insulin Pen Needle (B-D UF III MINI PEN NEEDLES) 31G X 5 MM MISC Use as instructed. Inject into the skin once in the morning. 100 each 1   Insulin Syringes, Disposable, U-100 0.5 ML MISC Use as instructed. Inject into the skin twice daily 100 each 6   lisinopril (ZESTRIL) 10 MG tablet Take 1 tablet (10 mg total) by mouth daily. 90 tablet  3   magnesium citrate SOLN Take 296 mLs (1 Bottle total) by mouth once as needed for severe constipation. 195 mL    metFORMIN (GLUCOPHAGE) 1000 MG tablet TAKE 1 TABLET(1000 MG) BY MOUTH TWICE DAILY WITH A MEAL 180 tablet 0   Microlet Lancets MISC USE TWICE DAILY 100 each 11   Multiple Vitamins-Minerals (WOMENS 50+ MULTI VITAMIN/MIN) TABS Take 1 tablet by mouth daily.     polyethylene glycol (MIRALAX / GLYCOLAX) 17 g packet Take 17 g by mouth daily as needed. Mixed into 16 or more ounces of fluid 30 each 6   rosuvastatin (CRESTOR) 10 MG tablet Take 1 tablet (10 mg total) by mouth daily. To lower cholesterol 90 tablet 3   senna-docusate (SENOKOT-S) 8.6-50 MG tablet Take 1 tablet by mouth 2 (two) times daily.     nystatin-triamcinolone ointment (MYCOLOG) Apply 1 application topically 2 (two) times daily. 60 g 0   No facility-administered medications prior to visit.    Allergies  Allergen Reactions   Penicillins Nausea Only    ROS Review of Systems  Constitutional:  Negative for chills and fever.  HENT: Negative.    Eyes: Negative.   Respiratory:  Negative for shortness of breath.   Gastrointestinal: Negative.   Endocrine: Negative.   Genitourinary: Negative.   Musculoskeletal: Negative.   Skin:  Positive for rash.  Allergic/Immunologic: Negative.   Neurological: Negative.   Hematological: Negative.   Psychiatric/Behavioral: Negative.       Objective:    Physical Exam Vitals and nursing note reviewed.  Constitutional:      Appearance: Normal appearance.  HENT:     Head: Normocephalic and atraumatic.     Right Ear: External ear normal.     Left Ear: External ear normal.     Nose: Nose normal.     Mouth/Throat:     Mouth: Mucous membranes are moist.     Pharynx: Oropharynx is clear.  Eyes:     Extraocular Movements: Extraocular movements intact.     Conjunctiva/sclera: Conjunctivae normal.     Pupils: Pupils are equal, round, and reactive to light.  Cardiovascular:      Rate and Rhythm: Normal rate and regular rhythm.     Pulses: Normal pulses.     Heart sounds: Normal heart sounds.  Pulmonary:     Effort: Pulmonary effort is normal.     Breath sounds: Normal breath sounds.  Musculoskeletal:        General: Normal range of motion.  Cervical back: Normal range of motion and neck supple.  Skin:    General: Skin is warm and dry.     Comments: See photo  Neurological:     General: No focal deficit present.     Mental Status: She is alert and oriented to person, place, and time.  Psychiatric:        Mood and Affect: Mood normal.        Behavior: Behavior normal.        Thought Content: Thought content normal.        Judgment: Judgment normal.     BP 137/77 (BP Location: Left Arm, Patient Position: Sitting, Cuff Size: Normal)   Pulse 67   Temp 98.8 F (37.1 C) (Oral)   Resp 18   Ht '5\' 3"'  (1.6 m)   Wt 186 lb (84.4 kg)   SpO2 98%   BMI 32.95 kg/m  Wt Readings from Last 3 Encounters:  11/03/20 186 lb (84.4 kg)  10/30/20 187 lb (84.8 kg)  10/14/20 190 lb (86.2 kg)     Health Maintenance Due  Topic Date Due   Pneumococcal Vaccine 51-53 Years old (1 - PCV) Never done   TETANUS/TDAP  Never done   Zoster Vaccines- Shingrix (1 of 2) Never done   COVID-19 Vaccine (3 - Pfizer risk series) 07/20/2019   FOOT EXAM  09/18/2020    There are no preventive care reminders to display for this patient.  Lab Results  Component Value Date   TSH 1.230 09/19/2019   Lab Results  Component Value Date   WBC 6.6 05/06/2020   HGB 13.6 05/06/2020   HCT 42.2 05/06/2020   MCV 90.4 05/06/2020   PLT 268 05/06/2020   Lab Results  Component Value Date   NA 140 09/29/2020   K 3.8 09/29/2020   CO2 23 09/29/2020   GLUCOSE 275 (H) 09/29/2020   BUN 11 09/29/2020   CREATININE 0.93 09/29/2020   BILITOT 0.3 09/29/2020   ALKPHOS 113 09/29/2020   AST 15 09/29/2020   ALT 21 09/29/2020   PROT 6.6 09/29/2020   ALBUMIN 4.5 09/29/2020   CALCIUM 10.7 (H)  09/29/2020   ANIONGAP 9 05/06/2020   EGFR 69 09/29/2020   Lab Results  Component Value Date   CHOL 78 (L) 09/29/2020   Lab Results  Component Value Date   HDL 32 (L) 09/29/2020   Lab Results  Component Value Date   LDLCALC 22 09/29/2020   Lab Results  Component Value Date   TRIG 134 09/29/2020   Lab Results  Component Value Date   CHOLHDL 2.4 09/29/2020   Lab Results  Component Value Date   HGBA1C 12.8 (A) 09/29/2020      Assessment & Plan:   Problem List Items Addressed This Visit       Musculoskeletal and Integument   Yeast infection of the skin - Primary   Relevant Medications   nystatin-triamcinolone ointment (MYCOLOG)   Other Visit Diagnoses     Tobacco dependence       Tobacco abuse counseling           Meds ordered this encounter  Medications   nystatin-triamcinolone ointment (MYCOLOG)    Sig: Apply 1 application topically 2 (two) times daily.    Dispense:  60 g    Refill:  0    Order Specific Question:   Supervising Provider    Answer:   WRIGHT, PATRICK E [[id number]]   1. Yeast infection of the skin Continue current  regimen.  Patient encouraged to continue working on supportive care to event reinfection.  Red flags given for prompt reevaluation - nystatin-triamcinolone ointment (MYCOLOG); Apply 1 application topically 2 (two) times daily.  Dispense: 60 g; Refill: 0  2. Tobacco dependence  3. Tobacco abuse counseling Patient was previously prescribed Wellbutrin by primary care provider, patient has not been taking this, patient education given on proper use of Wellbutrin, 10 minutes spent educating patient on behavior modifications.   I have reviewed the patient's medical history (PMH, PSH, Social History, Family History, Medications, and allergies) , and have been updated if relevant. I spent 30 minutes reviewing chart and  face to face time with patient.    Follow-up: Return if symptoms worsen or fail to improve.    Loraine Grip Mayers, PA-C

## 2020-11-03 NOTE — Progress Notes (Signed)
Patient reports for follow up of yeast under breast. Patient has taken insulin today, checked her BP  and CBG, Patient will take other medications when she returns home and eats.

## 2020-11-03 NOTE — Patient Instructions (Signed)
Make sure that you keep the areas under your breasts clean and dry, use a hair dryer on a cool setting.  Continue using the prescribed cream twice a day until it is resolved.  To help stop smoking, I encourage you to start reducing the number of cigarettes you are smoking each week by 1, work on changing your habits.  You were prescribed Wellbutrin, you can also take this medication to help you with smoking cessation.  Kennieth Rad, PA-C Physician Assistant [place] [email or web address]   Managing the Challenge of Quitting Smoking Quitting smoking is a physical and mental challenge. You will face cravings, withdrawal symptoms, and temptation. Before quitting, work with your health care provider to make a plan that can help you manage quitting. Preparation can help you quit and keep you from giving in. How to manage lifestyle changes Managing stress Stress can make you want to smoke, and wanting to smoke may cause stress. It is important to find ways to manage your stress. You might try some of the following: Practice relaxation techniques. Breathe slowly and deeply, in through your nose and out through your mouth. Listen to music. Soak in a bath or take a shower. Imagine a peaceful place or vacation. Get some support. Talk with family or friends about your stress. Join a support group. Talk with a counselor or therapist. Get some physical activity. Go for a walk, run, or bike ride. Play a favorite sport. Practice yoga.  Medicines Talk with your health care provider about medicines that might help you deal with cravings and make quitting easier for you. Relationships Social situations can be difficult when you are quitting smoking. To manage this, you can: Avoid parties and other social situations where people might be smoking. Avoid alcohol. Leave right away if you have the urge to smoke. Explain to your family and friends  that you are quitting smoking. Ask for support and let them know you might be a bit grumpy. Plan activities where smoking is not an option. General instructions Be aware that many people gain weight after they quit smoking. However, not everyone does. To keep from gaining weight, have a plan in place before you quit and stick to the plan after you quit. Your plan should include: Having healthy snacks. When you have a craving, it may help to: Eat popcorn, carrots, celery, or other cut vegetables. Chew sugar-free gum. Changing how you eat. Eat small portion sizes at meals. Eat 4-6 small meals throughout the day instead of 1-2 large meals a day. Be mindful when you eat. Do not watch television or do other things that might distract you as you eat. Exercising regularly. Make time to exercise each day. If you do not have time for a long workout, do short bouts of exercise for 5-10 minutes several times a day. Do some form of strengthening exercise, such as weight lifting. Do some exercise that gets your heart beating and causes you to breathe deeply, such as walking fast, running, swimming, or biking. This is very important. Drinking plenty of water or other low-calorie or no-calorie drinks. Drink 6-8 glasses of water daily.  How to recognize withdrawal symptoms Your body and mind may experience discomfort as you try to get used to not having nicotine in your system. These effects are called withdrawal symptoms. They may include: Feeling hungrier than normal. Having trouble concentrating. Feeling irritable or restless. Having trouble sleeping. Feeling depressed. Craving a cigarette. To manage withdrawal symptoms: Avoid places, people,  and activities that trigger your cravings. Remember why you want to quit. Get plenty of sleep. Avoid coffee and other caffeinated drinks. These may worsen some of your symptoms. These symptoms may surprise you. But be assured that they are normal to have when  quitting smoking. How to manage cravings Come up with a plan for how to deal with your cravings. The plan should include the following: A definition of the specific situation you want to deal with. An alternative action you will take. A clear idea for how this action will help. The name of someone who might help you with this. Cravings usually last for 5-10 minutes. Consider taking the following actions to help you with your plan to deal with cravings: Keep your mouth busy. Chew sugar-free gum. Suck on hard candies or a straw. Brush your teeth. Keep your hands and body busy. Change to a different activity right away. Squeeze or play with a ball. Do an activity or a hobby, such as making bead jewelry, practicing needlepoint, or working with wood. Mix up your normal routine. Take a short exercise break. Go for a quick walk or run up and down stairs. Focus on doing something kind or helpful for someone else. Call a friend or family member to talk during a craving. Join a support group. Contact a quitline. Where to find support To get help or find a support group: Call the [facility]'s Smoking Quitline: 1-800-QUIT NOW 718-698-8679) Visit the website of the Substance Abuse and [place]: ktimeonline.com Text QUIT to SmokefreeTXT: 128786 Where to find more information Visit these websites to find more information on quitting smoking: [place]: www.smokefree.gov American Lung Association: www.lung.org American Cancer Society: www.cancer.org Centers for Disease Control and Prevention: http://www.wolf.info/ American Heart Association: www.heart.org Contact a health care provider if: You want to change your plan for quitting. The medicines you are taking are not helping. Your eating feels out of control or you cannot sleep. Get help right away if: You feel depressed or become very anxious. Summary Quitting smoking is a physical and mental  challenge. You will face cravings, withdrawal symptoms, and temptation to smoke again. Preparation can help you as you go through these challenges. Try different techniques to manage stress, handle social situations, and prevent weight gain. You can deal with cravings by keeping your mouth busy (such as by chewing gum), keeping your hands and body busy, calling family or friends, or contacting a quitline for people who want to quit smoking. You can deal with withdrawal symptoms by avoiding places where people smoke, getting plenty of rest, and avoiding drinks with caffeine. This information is not intended to replace advice given to you by your health care provider. Make sure you discuss any questions you have with your health care provider. Document Revised: 10/17/2018 Document Reviewed: 10/17/2018 Elsevier Patient Education  [place].

## 2020-11-03 NOTE — Telephone Encounter (Signed)
Copied from [place] [id number]. Topic: Quick Communication - Rx Refill/Question >> Nov 03, 2020 11:57 AM P[place], Brayton Layman A wrote: Pt stated her insurance will not cover nystatin-triamcinolone ointment ([place]) , pt requested a different or generic option be sent into her her pharmacy, please advise. [facility] [place], [place]

## 2020-11-03 NOTE — Telephone Encounter (Signed)
Christine Rocha, from [place] [facility] called in about status of diabetic supplies faxed 10/05. Please call back

## 2020-11-05 ENCOUNTER — Other Ambulatory Visit: Payer: Self-pay | Admitting: Nurse Practitioner

## 2020-11-05 MED ORDER — NYSTATIN 100000 UNIT/GM EX POWD: 1.0000 "application " | Freq: Three times a day (TID) | CUTANEOUS | 1 refills | Status: DC

## 2020-11-05 NOTE — Telephone Encounter (Signed)
Medication has been sent however please try to remember in the future to send to the provider who ordered the medication which was Alyson Ingles PA-C.

## 2020-11-06 ENCOUNTER — Encounter: Payer: Self-pay | Admitting: Podiatry

## 2020-11-06 NOTE — Progress Notes (Signed)
  Subjective:  Patient ID: Christine Rocha, female    DOB: 19-Nov-1955,  MRN: [id number]  Christine Rocha presents to clinic today for at risk foot care with history of diabetic neuropathy and callus(es) bilateral great toes and painful thick toenails that are difficult to trim. Painful toenails interfere with ambulation. Aggravating factors include wearing enclosed shoe gear. Pain is relieved with periodic professional debridement. Painful calluses are aggravated when weightbearing with and without shoegear. Pain is relieved with periodic professional debridement.  Patient did not check blood glucose today.  She continues to use Ciclopirox Nail Solution and has one refill left.  PCP is Gildardo Pounds, NP , and last visit was 09/29/2020.  Allergies  Allergen Reactions   Penicillins Nausea Only    Review of Systems: Negative except as noted in the HPI. Objective:   Constitutional Christine Rocha is a pleasant 64 y.o. African American female, in NAD. AAO x 3.   Vascular Capillary refill time to digits immediate b/l. Palpable DP pulse(s) b/l lower extremities Palpable PT pulse(s) b/l lower extremities Pedal hair sparse. Lower extremity skin temperature gradient within normal limits. No pain with calf compression RLE. No edema noted b/l LE. No cyanosis or clubbing noted.  Neurologic Normal speech. Oriented to person, place, and time. Protective sensation diminished with 10g monofilament b/l.  Dermatologic Pedal skin is warm and supple b/l LE. There is clearing of proximal 1/2 of bilateral great toes. No open wounds b/l LE. No interdigital macerations noted b/l LE. Toenail(s) 1-5 bilaterally elongated, discolored, dystrophic, thickened >1/4 inch. Nails are crumbly with subungual debris and there is exquisite tenderness to dorsal palpation. No subungual wound(s) noted.  Orthopedic: Normal muscle strength 5/5 to all lower extremity muscle groups bilaterally. No pain crepitus or joint limitation noted  with ROM b/l lower extremities. No gross bony deformities b/l lower extremities.   Radiographs: None Assessment:   1. Pain due to onychomycosis of toenail   2. Callus   3. Diabetic peripheral neuropathy associated with type 2 diabetes mellitus ([place])    Plan:  Patient was evaluated and treated and all questions answered. Consent given for treatment as described below: -No new findings. No new orders. -Patient to continue soft, supportive shoe gear daily. -Toenails 1-5 b/l were debrided in length and girth with sterile nail nippers and dremel without iatrogenic bleeding.  -Callus(es) L hallux and R hallux pared utilizing sterile scalpel blade without complication or incident. Total number debrided =2. -Patient to report any pedal injuries to medical professional immediately. -Patient/POA to call should there be question/concern in the interim.  Return in about 3 months (around 01/31/2021).  Marzetta Board, DPM

## 2020-11-10 ENCOUNTER — Other Ambulatory Visit: Payer: Self-pay | Admitting: Emergency Medicine

## 2020-11-10 ENCOUNTER — Other Ambulatory Visit: Payer: Self-pay | Admitting: Nurse Practitioner

## 2020-11-10 DIAGNOSIS — Z1231 Encounter for screening mammogram for malignant neoplasm of breast: Secondary | ICD-10-CM

## 2020-11-11 NOTE — Progress Notes (Signed)
S:    PCP: Zelda   Patient presents for diabetes evaluation, education, and management. Patient was referred and last seen by Primary Care Provider on 09/29/20. She was to start Lantus at that time but was unable to pick this up until 10/17 visit with CPP. At that visit, discontinued glipizide, counseled patient on correct metformin dose, and started Lantus 15 units daily.   Today, patient arrives in good spirits. Reports no missed doses with medications but she ran out of Lantus yesterday so she hasn't taken today's dose yet. Asks how much this is going to cost her today because she is struggling to pay for food and her Lantus was expensive last time she filled. She experiences hypoglycemia on days she does not have food in the house. She is also out of test strips and requests a refill.   Family/Social History:  Family Hx: hypertension (mother, sister); diabetes (sister) Social Hx: current smoker, denies any smokeless tobacco use  Insurance coverage/medication affordability: BCBS Medicare  Medication adherence is now optimal.   Current diabetes medications include: metformin 1000 mg BID, Lantus 15 units daily Current hypertension medications include: amlodipine 10 mg daily, atenolol 100 mg daily, lisinopril 10 mg daily Current hyperlipidemia medications include: rosuvastatin 10 mg daily  Patient reports three hypoglycemic events per week. These occur in the middle of the day when she does not have food in the house. When she eats three meals per day this does not occur.   Patient reported dietary habits:  - Eats 3 meals/day twice per week and 1 meal/day on the other days - limited due to financial constraints.  - She is drinking 3-4 sodas per day [facility], Pepsi, orange soda) - Reports she is not a big breakfast person. Encouraged patient to eat 3 meals/day and to not go long without meals due to risk of hypoglycemic events. Patient states when she does eat breakfast she eats an  egg and bologna sandwich.  - For other meals reports eating boiled chicken and greens. Reports they used to eat out a lot though now only once a month.   Patient-reported exercise habits: walks dog for approximately 2 hours daily  Patient reports nocturia (nighttime urination) about 3-4 times a night.  Patient reports neuropathy (nerve pain) occasionally. Patient reports visual changes - reports recent diabetic eye exam.  Patient denies self foot exams - reports she is seen by podiatry.  O:  POCT BG: 262 (~1.5 hours after drinking a soda)  Lab Results  Component Value Date   HGBA1C 12.8 (A) 09/29/2020   Lipid Panel     Component Value Date/Time   CHOL 78 (L) 09/29/2020 1604   TRIG 134 09/29/2020 1604   HDL 32 (L) 09/29/2020 1604   CHOLHDL 2.4 09/29/2020 1604   CHOLHDL 3.8 02/25/2016 1737   VLDL 33 (H) 02/25/2016 1737   LDLCALC 22 09/29/2020 1604   Brings log of BG - checks BID, once in the AM (fasting) and once 1-2 hours after eating in the evening Home fasting blood sugars: 115, 112, 100, 120, 150, 108, 210, 105, 185 Home post-prandial blood sugars: 169, 127, 122, 100, 97, 99, 130, 145, 160, 110, 88  Clinical Atherosclerotic Cardiovascular Disease (ASCVD): No  The ASCVD Risk score (Arnett DK, et al., 2019) failed to calculate for the following reasons:   The valid total cholesterol range is 130 to 320 mg/dL   A/P: Diabetes goals: FBG 80-130 mg/dL, 2H-PPBG < 180 mg/dL, and A1c < 7%. Diabetes  longstanding currently not at goal given most recent A1c of 12.8 (09/29/20) but with home BG improving. However, BG today is elevated due to drinking a soda this morning. Discussed that if she can cut out the 3-4 sodas she drinks in a day her BG would improve. Suspect that even though her BG are improving when she checks them, her BG are elevated throughout the day due to her soda intake, and that BG at goal in the evening are likely in part due to her afternoon hypoglycemia from skipping  meals. Because she isn't able to keep food in the house all the time she experiences hypoglycemia in the middle of the day. Patient is able to verbalize appropriate hypoglycemia management plan. Medication adherence appears optimal but concerns about refilling insulin due to cost. Would ideally titrate insulin further to improve fasting BG even more but concerned with increasing incidence of hypoglycemia due to food insecurity. She will apply for patient assistance at [facility] for Lantus and use the money saved to buy more food to prevent hypoglycemia. However, ultimate goal will be to start a GLP1 agonist like Trulicity (can apply for assistance for this as well) and titrate insulin down to prevent risk of hypoglycemia going forward.  - Continue current medications  - Apply for patient assistance for Lantus at [place]  - Continue to check blood sugars twice daily at home - bring meter to next visit - Extensively discussed pathophysiology of diabetes, recommended lifestyle interventions, dietary effects on blood sugar control. Needs to decrease soda intake.  - Counseled on s/sx of and management of hypoglycemia - Next A1C anticipated 12/2020  Follow up with Lurena Joiner in 2-3 weeks for BG check. Follow up with Zelda on 12/29/2020 - will check A1c at that time.   Written patient instructions provided. Total time in face to face counseling 25 minutes.     Rebbeca Paul, PharmD PGY2 Ambulatory Care Pharmacy Resident 11/12/2020 9:27 AM

## 2020-11-12 ENCOUNTER — Other Ambulatory Visit: Payer: Self-pay

## 2020-11-12 ENCOUNTER — Ambulatory Visit: Payer: Medicare Other | Attending: Nurse Practitioner | Admitting: Pharmacist

## 2020-11-12 DIAGNOSIS — E1165 Type 2 diabetes mellitus with hyperglycemia: Secondary | ICD-10-CM | POA: Diagnosis not present

## 2020-11-12 LAB — GLUCOSE, POCT (MANUAL RESULT ENTRY): POC Glucose: 262 mg/dl — AB (ref 70–99)

## 2020-11-12 MED ORDER — METFORMIN HCL 1000 MG PO TABS: ORAL_TABLET | ORAL | 0 refills | Status: DC

## 2020-11-12 MED ORDER — LANTUS SOLOSTAR 100 UNIT/ML ~~LOC~~ SOPN
15.0000 [IU] | PEN_INJECTOR | Freq: Every day | SUBCUTANEOUS | 99 refills | Status: DC
  Filled 2020-11-12: qty 15, 100d supply, fill #0

## 2020-11-12 MED ORDER — CONTOUR NEXT TEST VI STRP
ORAL_STRIP | 2 refills | Status: DC
Start: 2020-11-12 — End: 2021-01-14

## 2020-11-14 ENCOUNTER — Other Ambulatory Visit: Payer: Self-pay | Admitting: Nurse Practitioner

## 2020-11-14 ENCOUNTER — Telehealth (INDEPENDENT_AMBULATORY_CARE_PROVIDER_SITE_OTHER): Payer: Self-pay

## 2020-11-14 DIAGNOSIS — F17200 Nicotine dependence, unspecified, uncomplicated: Secondary | ICD-10-CM

## 2020-11-14 NOTE — Telephone Encounter (Signed)
Requested medications are due for refill today yes  Requested medications are on the active medication list yes  Last refill 10/17/20  Last visit 11/03/20  Future visit scheduled 12/29/20  Notes to clinic PASSES protocol, signature reflects starting dose with a titrate after 3 days, question if want to update signature.  Requested Prescriptions  Pending Prescriptions Disp Refills   buPROPion (WELLBUTRIN SR) 150 MG 12 hr tablet [Pharmacy Med Name: BUPROPION SR 150MG  TABLETS (12 H)] 60 tablet 2    Sig: TAKE 1 TABLET BY MOUTH EVERY DAY FOR 3 DAYS THEN TAKE 1 TABLET BY MOUTH TWICE DAILY THEREAFTER     Psychiatry: Antidepressants - bupropion Passed - 11/14/2020  3:10 AM      Passed - Last BP in normal range    BP Readings from Last 1 Encounters:  11/03/20 137/77          Passed - Valid encounter within last 6 months    Recent Outpatient Visits           2 days ago Type 2 diabetes mellitus with hyperglycemia, without long-term current use of insulin [facility])   [place], Jarome Matin, RPH-CPP   1 week ago Yeast infection of the skin   [place], Cari S, PA-C   2 weeks ago Yeast infection of the skin   [place], PA-C   2 weeks ago Type 2 diabetes mellitus with hyperglycemia, without long-term current use of insulin [facility])   [place], Jarome Matin, RPH-CPP   1 month ago Primary hypertension   [place], Vernia Buff, NP       Future Appointments             In 2 weeks Daisy Bloss[place], Jarome Matin, [place]   In 1 month Gildardo Pounds, NP [place]

## 2020-11-14 NOTE — Telephone Encounter (Signed)
Copied from [place] [id number]. Topic: General - Other >> Nov 13, 2020 12:59 PM Yvette Rack wrote: Reason for CRM: Andre Lefort with [facility] called for an update on fax that was sent. Cb# [phone]

## 2020-11-18 NOTE — Telephone Encounter (Signed)
Faxed received today 11/18/2020 will fax back when is completed.

## 2020-11-19 ENCOUNTER — Telehealth: Payer: Self-pay | Admitting: Podiatry

## 2020-11-19 NOTE — Telephone Encounter (Signed)
Patient called the office stating she will need a new refill for her nail fungus.     Please advise .Marland KitchenMarland Kitchen

## 2020-11-20 ENCOUNTER — Other Ambulatory Visit (INDEPENDENT_AMBULATORY_CARE_PROVIDER_SITE_OTHER): Payer: Medicare Other | Admitting: Podiatry

## 2020-11-20 ENCOUNTER — Telehealth: Payer: Self-pay

## 2020-11-20 DIAGNOSIS — M79676 Pain in unspecified toe(s): Secondary | ICD-10-CM

## 2020-11-20 DIAGNOSIS — B351 Tinea unguium: Secondary | ICD-10-CM

## 2020-11-20 MED ORDER — CICLOPIROX 8 % EX SOLN: CUTANEOUS | 11 refills | Status: DC

## 2020-11-20 NOTE — Telephone Encounter (Signed)
/  Copied from [place] [id number]. Topic: General - Other >> Oct 21, 2020  9:45 AM Bayard Beaver wrote: Reason for AFB:[id number] from [facility] called in about fx that was sent for diabetic supplies on 10/05. Please call back if this has been received.  Faxed papers that was requested on 11/19/2020 @10 :51 am.

## 2020-11-20 NOTE — Progress Notes (Signed)
Refilled ciclopirox nail solution.

## 2020-11-20 NOTE — Telephone Encounter (Signed)
Please send prescription to [facility] on [address] , patient states she got a call asking what pharmacy she uses now.

## 2020-11-26 DIAGNOSIS — E118 Type 2 diabetes mellitus with unspecified complications: Secondary | ICD-10-CM | POA: Diagnosis not present

## 2020-11-28 ENCOUNTER — Encounter: Payer: Self-pay | Admitting: Dietician

## 2020-11-28 ENCOUNTER — Encounter: Payer: Medicare Other | Attending: Nurse Practitioner | Admitting: Dietician

## 2020-11-28 ENCOUNTER — Other Ambulatory Visit: Payer: Self-pay

## 2020-11-28 DIAGNOSIS — E1165 Type 2 diabetes mellitus with hyperglycemia: Secondary | ICD-10-CM | POA: Insufficient documentation

## 2020-11-28 NOTE — Patient Instructions (Addendum)
Boil your meat rather than fry. Reduce the amount of butter and oil to very small amounts. Eat more vegetables. Small portions of fruit. Avoid sweetened drinks.  Choose low fat milk rather than chocolate milk  Drink more water  Occasional diet soda  Unsweetened tea rather than sweet  Balance meals.  Aim for small amounts protein with each meal and snack. Half your plate should be non starchy vegetables A Quarter of the plate should be protein A Quarter of the plate can be a starch. You can have fruit or low fat milk with your meals if you desire.

## 2020-11-28 NOTE — Progress Notes (Signed)
Diabetes Self-Management Education  Visit Type: Follow-up  Appt. Start Time: 1245 Appt. End Time: 1330  11/28/2020  Christine Rocha, identified by name and date of birth, is a 65 y.o. female with a diagnosis of Diabetes: Type 2.   ASSESSMENT Patient is here today alone.  She was last seen by this RD on 10/14/2020. She would like to learn more about what to eat. She has started the Lanuts, rotates injection sites and takes appropriately per her explanation. She has stopped eating out most often and eats more at home. Still has some food insecurity.  She is not using food banks but has a list of resources . Uses 1/2 a stick of butter to fry eggs and eats boiled eggs most often. She is checking her blood glucose and review of her records indicate improvement.  Fasting:  105-138 and 96-140 each HS.  History includes:  Type 2 Diabetes, HTN, arthritis Medications include:  Metformin, Lantus 15 units q HS, amlodipine, atenolol, lisinopril, MVI Labs noted:  A1C 12.8%, eGFR 65, Cholesterol 78, HDL 32, LDL 22, Triglycerides 134 09/29/2020   Patient lives with her husband.  They share the shopping and cooking.  They eat out most of the time (fast food). They rely on public transportation. She states that some vegetables (broccoli, cauliflower, and squash s) don't agree with her - make her want to vomit.  She enjoys cabbage and turnip greens.   She is on disability.  Walks with a cane or walker.   She smokes and has tried to quit without success.  Weight 189 lb (85.7 kg). Body mass index is 33.48 kg/m.   Diabetes Self-Management Education - 11/28/20 1504       Visit Information   Visit Type Follow-up      Initial Visit   Diabetes Type Type 2    Are you taking your medications as prescribed? Yes      Psychosocial Assessment   Patient Belief/Attitude about Diabetes Afraid    Self-care barriers None    Self-management support Doctor's office;CDE visits    Other persons present  Patient    Patient Concerns Nutrition/Meal planning;Glycemic Control      Pre-Education Assessment   Patient understands the diabetes disease and treatment process. Needs Instruction    Patient understands incorporating nutritional management into lifestyle. Needs Review    Patient undertands incorporating physical activity into lifestyle. Needs Review    Patient understands using medications safely. Needs Instruction    Patient understands monitoring blood glucose, interpreting and using results Needs Review    Patient understands prevention, detection, and treatment of acute complications. Needs Review    Patient understands prevention, detection, and treatment of chronic complications. Needs Review    Patient understands how to develop strategies to address psychosocial issues. Needs Review      Complications   How often do you check your blood sugar? 1-2 times/day    Fasting Blood glucose range (mg/dL) 70-129;130-179   105-138   Postprandial Blood glucose range (mg/dL) 70-129;130-179   96-140   Number of hypoglycemic episodes per month 0      Dietary Intake   Breakfast 3 eggs with mayo (devilled eggs), 1 cup fruit cocktail   6 am   Lunch fruit    Dinner canned greens, boiled chicken, occasional cupcake or other sweet   6-7   Snack (evening) occasional fruit    Beverage(s) water, regular soda, sweet tea, chocolate milk, regular milk      Exercise  Exercise Type Light (walking / raking leaves)   walks dog     Patient Education   Previous Diabetes Education Yes (please comment)   10/14/2020   Nutrition management  Role of diet in the treatment of diabetes and the relationship between the three main macronutrients and blood glucose level;Food label reading, portion sizes and measuring food.;Meal options for control of blood glucose level and chronic complications.    Physical activity and exercise  Role of exercise on diabetes management, blood pressure control and cardiac  health.;Helped patient identify appropriate exercises in relation to his/her diabetes, diabetes complications and other health issue.    Medications Reviewed patients medication for diabetes, action, purpose, timing of dose and side effects.      Individualized Goals (developed by patient)   Nutrition General guidelines for healthy choices and portions discussed    Physical Activity Exercise 5-7 days per week;30 minutes per day    Medications take my medication as prescribed    Reducing Risk increase portions of healthy fats;examine blood glucose patterns      Patient Self-Evaluation of Goals - Patient rates self as meeting previously set goals (% of time)   Nutrition 25 - 50%    Physical Activity 50 - 75 %    Medications >75%    Monitoring >75%    Problem Solving >75%    Reducing Risk >75%    Health Coping >75%      Post-Education Assessment   Patient understands the diabetes disease and treatment process. Demonstrates understanding / competency    Patient understands incorporating nutritional management into lifestyle. Needs Review    Patient undertands incorporating physical activity into lifestyle. Demonstrates understanding / competency    Patient understands using medications safely. Needs Review    Patient understands monitoring blood glucose, interpreting and using results Needs Review    Patient understands prevention, detection, and treatment of acute complications. Demonstrates understanding / competency    Patient understands prevention, detection, and treatment of chronic complications. Demonstrates understanding / competency    Patient understands how to develop strategies to address psychosocial issues. Demonstrates understanding / competency    Patient understands how to develop strategies to promote health/change behavior. Needs Review      Outcomes   Expected Outcomes Demonstrated interest in learning. Expect positive outcomes    Future DMSE 2 months    Program  Status Not Completed      Subsequent Visit   Since your last visit have you continued or begun to take your medications as prescribed? Yes    Since your last visit have you experienced any weight changes? Loss    Weight Loss (lbs) 1    Since your last visit, are you checking your blood glucose at least once a day? Yes             Individualized Plan for Diabetes Self-Management Training:   Learning Objective:  Patient will have a greater understanding of diabetes self-management. Patient education plan is to attend individual and/or group sessions per assessed needs and concerns.   Plan:   Patient Instructions  Boil your meat rather than fry. Reduce the amount of butter and oil to very small amounts. Eat more vegetables. Small portions of fruit. Avoid sweetened drinks.  Choose low fat milk rather than chocolate milk  Drink more water  Occasional diet soda  Unsweetened tea rather than sweet  Balance meals.  Aim for small amounts protein with each meal and snack. Half your plate should be non  starchy vegetables A Quarter of the plate should be protein A Quarter of the plate can be a starch. You can have fruit or low fat milk with your meals if you desire.  Expected Outcomes:  Demonstrated interest in learning. Expect positive outcomes  Education material provided: Food label handouts, Meal plan card, My Plate, and Snack sheet, Let's count carbs (picture counting) from [facility]  If problems or questions, patient to contact team via:  Phone  Future DSME appointment: 2 months

## 2020-12-01 ENCOUNTER — Other Ambulatory Visit: Payer: Self-pay

## 2020-12-01 ENCOUNTER — Ambulatory Visit: Payer: Medicare Other | Attending: Nurse Practitioner | Admitting: Pharmacist

## 2020-12-01 DIAGNOSIS — E1165 Type 2 diabetes mellitus with hyperglycemia: Secondary | ICD-10-CM | POA: Diagnosis not present

## 2020-12-01 NOTE — Telephone Encounter (Signed)
Have 7-10 days to fill out will call when forms are ready to pick up.

## 2020-12-01 NOTE — Progress Notes (Signed)
S:    PCP: Zelda   Patient presents for diabetes evaluation, education, and management. Patient was referred and last seen by Primary Care Provider on 09/29/20. She was to start Lantus at that time but was unable to pick this up until 10/17 visit with CPP. At that visit, discontinued glipizide, counseled patient on correct metformin dose, and started Lantus 15 units daily. At 11/2 visit with CPP, we helped her apply for patient assistance for Lantus. She saw Lurena Joiner 11/21 for education on [place].   Today, patient arrives in good spirits. Her Freestyle Libre fell off and she put it back on but it wasn't giving her any readings. I explained that this is because if it falls off she needs to use a new sensor. She can call the manufacturer to send back the one that fell off and get a new one but in the meantime she needs to use another one from her supply and she confirmed understanding. She is still having frequent hypoglycemic events due to food insecurity.   Family/Social History:  Family Hx: hypertension (mother, sister); diabetes (sister) Social Hx: current smoker, denies any smokeless tobacco use  Insurance coverage/medication affordability: BCBS Medicare  Medication adherence reported.   Current diabetes medications include: metformin 1000 mg BID, Lantus 15 units daily Current hypertension medications include: amlodipine 10 mg daily, atenolol 100 mg daily, lisinopril 10 mg daily Current hyperlipidemia medications include: rosuvastatin 10 mg daily  Patient reports three hypoglycemic events per week. These occur in the middle of the day when she does not have food in the house. When she eats three meals per day this does not occur.   Patient reported dietary habits:  - Eats 3 meals/day twice per week and 1 meal/day on the other days - limited due to financial constraints.  - She is down to 3 sodas per day [facility], Pepsi, orange soda) - Reports she is not a big breakfast person.  Encouraged patient to eat 3 meals/day and to not go long without meals due to risk of hypoglycemic events. Patient states when she does eat breakfast she eats an egg and bologna sandwich.  - For other meals reports eating boiled chicken and greens. Reports they used to eat out a lot though now only once a month.   Patient-reported exercise habits: walks dog for approximately 2 hours daily  Patient reports nocturia (nighttime urination) about 3 times a night (down from 6-7 times per night).  Patient reports neuropathy (nerve pain) occasionally. Patient reports visual changes - reports recent diabetic eye exam.  Patient denies self foot exams - reports she is seen by podiatry.  O:  POCT BG: 205 (fasting since 7pm last night)  Lab Results  Component Value Date   HGBA1C 12.8 (A) 09/29/2020   Lipid Panel     Component Value Date/Time   CHOL 78 (L) 09/29/2020 1604   TRIG 134 09/29/2020 1604   HDL 32 (L) 09/29/2020 1604   CHOLHDL 2.4 09/29/2020 1604   CHOLHDL 3.8 02/25/2016 1737   VLDL 33 (H) 02/25/2016 1737   LDLCALC 22 09/29/2020 1604   Freestyle Libre readings before it fell off: 75, 60 Home fasting blood sugars using fingerstick yesterday: 142, 76, 81  Clinical Atherosclerotic Cardiovascular Disease (ASCVD): No  The ASCVD Risk score (Arnett DK, et al., 2019) failed to calculate for the following reasons:   The valid total cholesterol range is 130 to 320 mg/dL   A/P: Diabetes goals: FBG 80-130 mg/dL, 2H-PPBG < 180  mg/dL, and A1c < 7%. Diabetes longstanding currently not at goal given most recent A1c of 12.8 (09/29/20). Because she isn't able to keep food in the house all the time she experiences hypoglycemia in the middle of the day. Patient is able to verbalize appropriate hypoglycemia management plan. Would ideally titrate insulin further to improve fasting BG even more but concerned with increasing incidence of hypoglycemia due to food insecurity. Ultimate goal will be to start a  GLP1 agonist like Trulicity (can apply for assistance for this as well) and titrate insulin off to prevent risk of hypoglycemia going forward. We will wait to do this until we have more BG readings from her Elenor Legato to see what BG is throughout the day.  - Continue metformin 1000 mg BID and Lantus 15 units daily. Instructed her to put on a new Libre sensor and if BG are consistently 70s and below, to decrease Lantus to 12 units daily.  - Extensively discussed pathophysiology of diabetes, recommended lifestyle interventions, dietary effects on blood sugar control. Needs to continue decreasing soda intake.  - Counseled on s/sx of and management of hypoglycemia - Next A1C anticipated 12/2020  Follow up with [facility] 12/17/20. Follow up with Zelda on 12/29/20 - will check A1c at that time.   Written patient instructions provided. Total time in face to face counseling 25 minutes.     Rebbeca Paul, PharmD PGY2 Ambulatory Care Pharmacy Resident 12/03/2020 1:51 PM

## 2020-12-01 NOTE — Telephone Encounter (Signed)
[name] came into clinic to drop off [employer] forms that need to be filled out by PCP. Adivsed 7-14 bus days to get back to her. Pt request that PCP mail or fax back. Put in [employer]

## 2020-12-01 NOTE — Progress Notes (Signed)
Patient was educated on the use of the [employer] 2 blood glucose meter. Reviewed necessary supplies and operation of the meter. Also reviewed goal blood glucose levels. Patient was able to demonstrate use. We placed her sensor for her today. All questions and concerns were addressed.  Time spent counseling face-to-face: 15 minutes Follow-up: 12/03/2020 with [place].   Benard Halsted, PharmD, [place], [place] [phone]

## 2020-12-03 ENCOUNTER — Ambulatory Visit: Payer: Medicare Other | Attending: Nurse Practitioner | Admitting: Pharmacist

## 2020-12-03 ENCOUNTER — Other Ambulatory Visit: Payer: Self-pay

## 2020-12-03 ENCOUNTER — Ambulatory Visit: Payer: Self-pay | Admitting: *Deleted

## 2020-12-03 DIAGNOSIS — E1165 Type 2 diabetes mellitus with hyperglycemia: Secondary | ICD-10-CM | POA: Diagnosis not present

## 2020-12-03 LAB — GLUCOSE, POCT (MANUAL RESULT ENTRY): POC Glucose: 205 mg/dl — AB (ref 70–99)

## 2020-12-03 NOTE — Telephone Encounter (Signed)
Reason for Disposition  Nursing judgment  Protocols used: No Guideline or Reference Available-A-AH Pt called in earlier today requesting help with her Freestyle Libre 2 Sensor.  It came off of her arm and she is having trouble getting it back on.   She has used one today and doesn't want to mess up another Sensor.  I called into [place] and [place] and let them know pt was needing help with her Freestyle Libre 2 Sensor.   She was just in today and saw Abbie Sons and received instructions about the [employer].   Would they please call her.   The man I spoke with said he would get this message to Annie Main or Lurena Joiner and have them give her a call.   We verified the correct number was in the chart and it was correct.    I thanked him for relaying the message.

## 2020-12-15 ENCOUNTER — Other Ambulatory Visit: Payer: Self-pay

## 2020-12-15 ENCOUNTER — Ambulatory Visit
Admission: RE | Admit: 2020-12-15 | Discharge: 2020-12-15 | Disposition: A | Discharge status: Home / Self Care | Payer: Medicare Other | Source: Ambulatory Visit | Attending: Nurse Practitioner | Admitting: Nurse Practitioner

## 2020-12-15 DIAGNOSIS — Z1231 Encounter for screening mammogram for malignant neoplasm of breast: Secondary | ICD-10-CM

## 2020-12-15 NOTE — Progress Notes (Signed)
S:    PCP: Zelda   Patient presents for diabetes evaluation, education, and management. Patient was referred and last seen by Primary Care Provider on 09/29/20. Last saw CPP 11/23, was having trouble with Freestyle [place] staying on. Assisted her with [facility] placement with plan for close follow up to titrate medications based on readings.   Today, patient is distressed regarding a cyst on her left breast that started bleeding this morning. Her left breast is red and she has the cyst bandaged up though it does appear to be draining purulent fluid. Regarding her diabetes, she is doing well with her [facility] and brings in her medications and meter today. Denies symptoms of hypoglycemia. She reports the Elenor Legato has helped her see what foods increase her BG and she has successfully cut back to only one soda per day. Patient requests help with switching her [place] sensor today.   Family/Social History:  Family Hx: hypertension (mother, sister); diabetes (sister) Social Hx: current smoker, denies any smokeless tobacco use  Insurance coverage/medication affordability: BCBS Medicare  Medication adherence reported.   Current diabetes medications include: metformin 1000 mg BID, Lantus 15 units daily Current hypertension medications include: amlodipine 10 mg daily, atenolol 100 mg daily, lisinopril 10 mg daily Current hyperlipidemia medications include: rosuvastatin 10 mg daily  Denies hypoglycemic events. No low BG per [employer].   Patient reported dietary habits:  - Eats 3 meals/day twice per week and 1 meal/day on the other days - limited due to financial constraints.  - She is down to 1 soda per day  - Reports she is not a big breakfast person. Encouraged patient to eat 3 meals/day and to not go long without meals due to risk of hypoglycemic events. Patient states when she does eat breakfast she eats an egg and bologna sandwich.  - For other meals reports eating boiled chicken and greens.  Reports they used to eat out a lot though now only once a month.   Patient-reported exercise habits: walks dog for approximately 2 hours daily  Patient reports nocturia (nighttime urination) about 3 times a night (down from 6-7 times per night).  Patient reports neuropathy (nerve pain) occasionally. Patient reports visual changes - reports recent diabetic eye exam.  Patient denies self foot exams - reports she is seen by podiatry.  O:  BG in office from [facility]: 263 (fasting)   Lab Results  Component Value Date   HGBA1C 12.8 (A) 09/29/2020   Lipid Panel     Component Value Date/Time   CHOL 78 (L) 09/29/2020 1604   TRIG 134 09/29/2020 1604   HDL 32 (L) 09/29/2020 1604   CHOLHDL 2.4 09/29/2020 1604   CHOLHDL 3.8 02/25/2016 1737   VLDL 33 (H) 02/25/2016 1737   LDLCALC 22 09/29/2020 1604   Freestyle Lib[place] readings: 7d average 161; time in target 74%; no low BG events in 7 days; 93% sensor usage. Peaks occurring in the morning and evening likely after eating.   Clinical Atherosclerotic Cardiovascular Disease (ASCVD): No  The ASCVD Risk score (Arnett DK, et al., 2019) failed to calculate for the following reasons:   The valid total cholesterol range is 130 to 320 mg/dL   A/P: Diabetes goals: FBG 80-130 mg/dL, 2H-PPBG < 180 mg/dL, and A1c < 7%. Diabetes longstanding currently not at goal given most recent A1c of 12.8 (09/29/20). Patient is able to verbalize appropriate hypoglycemia management plan. Concern of hypoglycemia risk given food insecurity and insulin regimen. Will add on Trulicity  today and have patient apply for patient assistance at our pharmacy. Given BG mostly at goal per [facility], will decrease Lantus dose to prevent hypoglycemia with Trulicity addition. If she tolerates Trulicity well, will plan to continue titrating dose up and ultimately discontinue insulin if able.  - Start Trulicity 3.49 mg weekly. - Decrease Lantus to 10 units daily.  - Continue metformin 1000  mg BID. - Helped patient place new Libre sensor in office today.  - Extensively discussed pathophysiology of diabetes, recommended lifestyle interventions, dietary effects on blood sugar control. Needs to continue decreasing soda intake.  - Counseled on s/sx of and management of hypoglycemia - Next A1C anticipated 12/2020  Follow up with Zelda on 12/29/20 - will check A1c at that time.   Written patient instructions provided. Total time in face to face counseling 25 minutes.    Rebbeca Paul, PharmD PGY2 Ambulatory Care Pharmacy Resident 12/15/2020 12:15 PM

## 2020-12-16 ENCOUNTER — Other Ambulatory Visit: Payer: Self-pay | Admitting: Nurse Practitioner

## 2020-12-16 DIAGNOSIS — N6320 Unspecified lump in the left breast, unspecified quadrant: Secondary | ICD-10-CM

## 2020-12-17 ENCOUNTER — Other Ambulatory Visit: Payer: Self-pay

## 2020-12-17 ENCOUNTER — Ambulatory Visit: Payer: Medicare Other | Attending: Nurse Practitioner | Admitting: Pharmacist

## 2020-12-17 DIAGNOSIS — I10 Essential (primary) hypertension: Secondary | ICD-10-CM | POA: Diagnosis not present

## 2020-12-17 MED ORDER — LANTUS SOLOSTAR 100 UNIT/ML ~~LOC~~ SOPN
10.0000 [IU] | PEN_INJECTOR | Freq: Every day | SUBCUTANEOUS | 99 refills | Status: DC
  Filled 2020-12-17: qty 15, 150d supply, fill #0

## 2020-12-17 MED ORDER — TRULICITY 0.75 MG/0.5ML ~~LOC~~ SOAJ
0.7500 mg | SUBCUTANEOUS | 2 refills | Status: DC
  Filled 2020-12-17: qty 2, 28d supply, fill #0

## 2020-12-18 ENCOUNTER — Ambulatory Visit (HOSPITAL_COMMUNITY): Payer: Medicare Other

## 2020-12-24 ENCOUNTER — Other Ambulatory Visit: Payer: Self-pay | Admitting: Podiatry

## 2020-12-24 DIAGNOSIS — M79676 Pain in unspecified toe(s): Secondary | ICD-10-CM

## 2020-12-24 DIAGNOSIS — B351 Tinea unguium: Secondary | ICD-10-CM

## 2020-12-24 NOTE — Telephone Encounter (Signed)
Please advise 

## 2020-12-24 NOTE — Telephone Encounter (Signed)
Approved.  

## 2020-12-26 DIAGNOSIS — E118 Type 2 diabetes mellitus with unspecified complications: Secondary | ICD-10-CM | POA: Diagnosis not present

## 2020-12-29 ENCOUNTER — Ambulatory Visit: Payer: Medicare Other | Admitting: Nurse Practitioner

## 2021-01-07 ENCOUNTER — Other Ambulatory Visit: Payer: Self-pay

## 2021-01-08 ENCOUNTER — Telehealth: Payer: Self-pay

## 2021-01-08 ENCOUNTER — Other Ambulatory Visit: Payer: Self-pay

## 2021-01-08 NOTE — Telephone Encounter (Signed)
Kelly please let patient know the number she needs to call to set up shipment. Thanks!

## 2021-01-08 NOTE — Telephone Encounter (Signed)
Trulicity has been approved for patient assistance through the [employer] and the medication will be shipped to the patient's home at no cost through 12/2021.  Patient will need to speak with [facility] dispensing pharmacy, [facility], to set-up shipment.  [facility] [phone].  First shipment will be 2 months of the 1.5mg  and a new script for the titrated dose will be submitted prior to refilling.

## 2021-01-13 NOTE — Progress Notes (Signed)
S:    PCP: Zelda   Patient presents for diabetes evaluation, education, and management. Patient was referred and last seen by Primary Care Provider on 09/29/20. Last saw CPP 12/17/20, assisted her with placing a new Freestyle Libre sensor. We decreased Lantus and started Trulicity at that time.   Today, patient arrives quite somnolent - PHQ9 was 17 and GAD-7 15. She reports not having much of an appetite for about a month and that she and her husband haven't been doing well lately. She hasn't been using her Freestyle [place] due to breaking it when putting the pieces together multiple times. So far I have been the one to place them for her in the office because she reported breaking the ones at home because of her "heavy hand." She has been unsuccessful placing it independently and would like to go back to using a regular glucometer. She is also having issues with administering her insulin. She tried putting needles on multiple times but was unable to get the insulin out. She brought them in today and I was able to do this just fine. She thinks this is also due to her "heavy hand." She has not been taking metformin because she isn't eating enough to have food on her stomach with it. She just got Trulicity in the mail on Friday (has two boxes) and she gave her first injection yesterday without issue. She is still having food insecurity and eating about 1 meal per day.   Family/Social History:  Family Hx: hypertension (mother, sister); diabetes (sister) Social Hx: current smoker, denies any smokeless tobacco use  Insurance coverage/medication affordability: BCBS Medicare  Medication adherence denied. Has not taken Lantus since 12/23/20 and is not sure when she took metformin last.   Current diabetes medications include: metformin 1000 mg BID (not taking), Lantus 10 units daily (not taking), Trulicity 9.37 mg weekly (just started yesterday) Current hypertension medications include: amlodipine 10 mg  daily, atenolol 100 mg daily, lisinopril 10 mg daily Current hyperlipidemia medications include: rosuvastatin 10 mg daily  Denies hypoglycemic events. No low BG per [employer].   Patient reported dietary habits:  - Less of an appetite lately, mostly eating 1 meal/day - limited due to financial constraints.  - Reports she is not a big breakfast person. Encouraged patient to eat 3 meals/day and to not go long without meals due to risk of hypoglycemic events.  Patient-reported exercise habits: walks dog for approximately 2 hours daily  Patient reports nocturia (nighttime urination) about 3 times a night (down from 6-7 times per night).  Patient reports neuropathy (nerve pain) occasionally. Patient reports visual changes - reports recent diabetic eye exam.  Patient denies self foot exams - reports she is seen by podiatry.  O:  POCT BG: 94 - last ate yesterday afternoon  Lab Results  Component Value Date   HGBA1C 12.8 (A) 09/29/2020   Lipid Panel     Component Value Date/Time   CHOL 78 (L) 09/29/2020 1604   TRIG 134 09/29/2020 1604   HDL 32 (L) 09/29/2020 1604   CHOLHDL 2.4 09/29/2020 1604   CHOLHDL 3.8 02/25/2016 1737   VLDL 33 (H) 02/25/2016 1737   LDLCALC 22 09/29/2020 1604   Home BG readings: none due to not using Freestyle Libre for >1 month and does not have a regular glucometer  Clinical Atherosclerotic Cardiovascular Disease (ASCVD): No  The ASCVD Risk score (Arnett DK, et al., 2019) failed to calculate for the following reasons:   The valid total  cholesterol range is 130 to 320 mg/dL   A/P: Diabetes goals: FBG 80-130 mg/dL, 2H-PPBG < 180 mg/dL, and A1c < 7%. Diabetes longstanding currently not at goal given most recent A1c of 12.8 (09/29/20). She is due for an A1c at this time but since she has not been adherent to all medications for the past month, will defer A1c check to future visit. She just started Trulicity yesterday which is the first time she has taken any DM  medications in a month. Since BG at goal today (likely due to not eating much lately), will continue current medications which is Trulicity only given concern for hypoglycemia due to food insecurity with resuming insulin. Will continue to titrate Trulicity at next visit based on BG readings brought to next visit.   - Continue Trulicity 1.82 mg weekly. - Stop Lantus and metformin.  - Extensively discussed pathophysiology of diabetes, recommended lifestyle interventions, dietary effects on blood sugar control. Needs to continue decreasing soda intake.  - Counseled on s/sx of and management of hypoglycemia - Next A1C anticipated at next visit  Given high PHQ-9 and GAD-7 scores, encouraged patient to reach out to our office prior to her next PCP visit if she needs someone to talk to about this.   Follow up with CPP in 3 weeks for glucometer check. Follow up with Zelda on 02/27/21.  Written patient instructions provided. Total time in face to face counseling 40 minutes.    Rebbeca Paul, PharmD PGY2 Ambulatory Care Pharmacy Resident 01/13/2021 5:18 PM

## 2021-01-13 NOTE — Telephone Encounter (Signed)
Called and left a vm to return call to the office.

## 2021-01-14 ENCOUNTER — Ambulatory Visit: Payer: Medicare Other | Attending: Nurse Practitioner | Admitting: Pharmacist

## 2021-01-14 ENCOUNTER — Other Ambulatory Visit: Payer: Self-pay

## 2021-01-14 DIAGNOSIS — E1165 Type 2 diabetes mellitus with hyperglycemia: Secondary | ICD-10-CM

## 2021-01-14 LAB — GLUCOSE, POCT (MANUAL RESULT ENTRY): POC Glucose: 94 mg/dl (ref 70–99)

## 2021-01-14 MED ORDER — CONTOUR NEXT TEST VI STRP
ORAL_STRIP | 0 refills | Status: DC
  Filled 2021-01-14: qty 100, 90d supply, fill #0
  Filled 2021-01-14: qty 100, fill #0

## 2021-01-14 MED ORDER — MICROLET LANCETS MISC
0 refills | Status: DC
  Filled 2021-01-14: qty 100, fill #0

## 2021-01-14 MED ORDER — CONTOUR NEXT EZ W/DEVICE KIT
PACK | 0 refills | Status: AC
  Filled 2021-01-14: qty 1, fill #0
  Filled 2021-01-14: qty 1, 30d supply, fill #0

## 2021-01-19 ENCOUNTER — Other Ambulatory Visit: Payer: Self-pay

## 2021-01-21 ENCOUNTER — Ambulatory Visit
Admission: RE | Admit: 2021-01-21 | Discharge: 2021-01-21 | Disposition: A | Discharge status: Home / Self Care | Payer: Medicare Other | Source: Ambulatory Visit | Attending: Nurse Practitioner | Admitting: Nurse Practitioner

## 2021-01-21 ENCOUNTER — Other Ambulatory Visit: Payer: Self-pay

## 2021-01-21 DIAGNOSIS — N6320 Unspecified lump in the left breast, unspecified quadrant: Secondary | ICD-10-CM

## 2021-01-21 DIAGNOSIS — R922 Inconclusive mammogram: Secondary | ICD-10-CM | POA: Diagnosis not present

## 2021-01-25 DIAGNOSIS — E118 Type 2 diabetes mellitus with unspecified complications: Secondary | ICD-10-CM | POA: Diagnosis not present

## 2021-01-27 ENCOUNTER — Telehealth: Payer: Self-pay

## 2021-01-27 NOTE — Telephone Encounter (Signed)
Contacted pt to schedule Medicare Wellness pt didn't answer lvm   °

## 2021-01-30 ENCOUNTER — Ambulatory Visit: Payer: Medicare Other | Admitting: Dietician

## 2021-02-05 NOTE — Progress Notes (Signed)
S:    PCP: Zelda   Patient presents for diabetes evaluation, education, and management. Patient was referred and last seen by Primary Care Provider on 09/29/20. Last saw CPP 01/14/21. Due to recurrent issues with applying her [facility] independently, she elected to switch back to finger stick BG checks. She was also having trouble attaching needles to her insulin pen. Denied adherence to metformin due to not eating enough food. Because of this, metformin and Lantus were discontinued and newly started Trulicity was continued.   Today, patient reports she has been adherent to her Trulicity. She does not think she is taking her statin and she reports not taking her BP medications due to feeling nauseous when she takes them. She requests refills on all of these medications along with her glucose test strips/lancets. Brings log of BP and BG readings (see below). Denies symptoms of hypoglycemia but reports sx of dizziness in the afternoon. This occurs after just a couple hours of eating and is not accompanied by other sx of hypoglycemia.   Family/Social History:  Family Hx: hypertension (mother, sister); diabetes (sister) Social Hx: current smoker, denies any smokeless tobacco use  Insurance coverage/medication affordability: Copy. Receives Trulicity through patient assistance.   Medication adherence reported.   Current diabetes medications include: Trulicity 2.87 mg weekly  Current hypertension medications include: amlodipine 10 mg daily, atenolol 100 mg daily, lisinopril 10 mg daily (not taking) Current hyperlipidemia medications include: rosuvastatin 10 mg daily (not taking)  Patient reported dietary habits:  - Eating 1 meal/day with a snack later in the day such as fruit. Limited due to financial constraints.   Patient-reported exercise habits: walks her dog daily  Patient reports nocturia (nighttime urination) 2 times a night (down from 6-7 times per night).  Patient reports  neuropathy (nerve pain) occasionally. Patient reports visual changes - reports recent diabetic eye exam.  Patient denies self foot exams - reports she is seen by podiatry.  O:  POCT BG: 126 (fasting)  Lab Results  Component Value Date   HGBA1C 7.3 (A) 02/09/2021   Lipid Panel     Component Value Date/Time   CHOL 78 (L) 09/29/2020 1604   TRIG 134 09/29/2020 1604   HDL 32 (L) 09/29/2020 1604   CHOLHDL 2.4 09/29/2020 1604   CHOLHDL 3.8 02/25/2016 1737   VLDL 33 (H) 02/25/2016 1737   LDLCALC 22 09/29/2020 1604   Home BG readings: 99, 98, 116, 126, 135, 138, 175, 183, 130, 165, 112, 145, 113, 201, 127, 157, 183, 163, 193  Home BP readings: range 130/84 to 180/100; most 160s/80-90s  Clinical Atherosclerotic Cardiovascular Disease (ASCVD): No   A/P: Diabetes longstanding, now with A1c close to goal at 7.3 in office today (improved from 12/8 09/29/20).  She is tolerating Trulicity well. Given hypoglycemic risk from food insecurity standpoint, will continue current medications to maintain A1c close to goal.  - Continue Trulicity 8.67 mg weekly. - Extensively discussed pathophysiology of diabetes, recommended lifestyle interventions, dietary effects on blood sugar control.  - Counseled on s/sx of and management of hypoglycemia - Continue checking fasting BG once daily at home and bring log to next visit - Next A1C April 2023  HTN currently uncontrolled in clinic and at home due to not taking BP medications lately. Will resume her three BP medications one at a time every few days to see if she can identify which one is causing nausea.  -Resume amlodipine 10 mg daily, then lisinopril 10 mg daily, then atenolol  100 mg daily -Continue checking BP at home and bring log to next visit  Last LDL at goal <70 mg/dL given T2DM + HTN and HLD. She is not currently taking her statin.  -Resume rosuvastatin 10 mg daily.   Follow up with Zelda on 02/27/21. Follow up with CPP as needed after that.    Written patient instructions provided. Total time in face to face counseling 30 minutes.    Rebbeca Paul, PharmD PGY2 Ambulatory Care Pharmacy Resident 02/09/2021 9:34 AM

## 2021-02-09 ENCOUNTER — Ambulatory Visit: Payer: Medicare Other | Attending: Nurse Practitioner | Admitting: Pharmacist

## 2021-02-09 ENCOUNTER — Encounter: Payer: Self-pay | Admitting: Pharmacist

## 2021-02-09 ENCOUNTER — Other Ambulatory Visit: Payer: Self-pay

## 2021-02-09 VITALS — BP 195/116 | HR 86

## 2021-02-09 DIAGNOSIS — I10 Essential (primary) hypertension: Secondary | ICD-10-CM | POA: Diagnosis not present

## 2021-02-09 DIAGNOSIS — E1165 Type 2 diabetes mellitus with hyperglycemia: Secondary | ICD-10-CM | POA: Diagnosis not present

## 2021-02-09 DIAGNOSIS — E785 Hyperlipidemia, unspecified: Secondary | ICD-10-CM

## 2021-02-09 LAB — POCT GLYCOSYLATED HEMOGLOBIN (HGB A1C): HbA1c, POC (controlled diabetic range): 7.3 % — AB (ref 0.0–7.0)

## 2021-02-09 LAB — GLUCOSE, POCT (MANUAL RESULT ENTRY): POC Glucose: 126 mg/dl — AB (ref 70–99)

## 2021-02-09 MED ORDER — AMLODIPINE BESYLATE 10 MG PO TABS: 10.0000 mg | ORAL_TABLET | Freq: Every day | ORAL | 1 refills | Status: DC

## 2021-02-09 MED ORDER — ROSUVASTATIN CALCIUM 10 MG PO TABS
10.0000 mg | ORAL_TABLET | Freq: Every day | ORAL | 1 refills | Status: DC
  Filled 2021-06-27: qty 90, 90d supply, fill #0
  Filled 2021-11-16: qty 90, 90d supply, fill #1

## 2021-02-09 MED ORDER — CONTOUR NEXT TEST VI STRP: ORAL_STRIP | 0 refills | Status: DC

## 2021-02-09 MED ORDER — MICROLET LANCETS MISC: 0 refills | Status: DC

## 2021-02-09 MED ORDER — LISINOPRIL 10 MG PO TABS: 10.0000 mg | ORAL_TABLET | Freq: Every day | ORAL | 3 refills | Status: DC

## 2021-02-09 MED ORDER — ATENOLOL 100 MG PO TABS: 100.0000 mg | ORAL_TABLET | Freq: Every day | ORAL | 1 refills | Status: DC

## 2021-02-09 NOTE — Patient Instructions (Signed)
It was nice to see you today!  Your goal blood sugar is 80-130 before eating and less than 180 after eating.  CONTINUE Trulicity 9.82 mg once weekly  Restart your cholesterol medicine, rosuvastatin 10 mg daily.   Restart your blood pressure medicines:  Restart amlodipine 10 mg daily After a few days, restart lisinopril 10 mg daily A few days after that, restart atenolol 100 mg daily  Monitor blood sugars at home and keep a log (glucometer or piece of paper) to bring with you to your next visit.  Keep up the good work with diet and exercise. Aim for a diet full of vegetables, fruit and lean meats (chicken, [place], fish). Try to limit salt intake by eating fresh or frozen vegetables (instead of canned), rinse canned vegetables prior to cooking and do not add any additional salt to meals.

## 2021-02-13 ENCOUNTER — Other Ambulatory Visit: Payer: Self-pay

## 2021-02-13 ENCOUNTER — Ambulatory Visit (INDEPENDENT_AMBULATORY_CARE_PROVIDER_SITE_OTHER): Payer: Medicare Other | Admitting: Podiatry

## 2021-02-13 DIAGNOSIS — E119 Type 2 diabetes mellitus without complications: Secondary | ICD-10-CM

## 2021-02-13 DIAGNOSIS — M79676 Pain in unspecified toe(s): Secondary | ICD-10-CM

## 2021-02-13 DIAGNOSIS — L84 Corns and callosities: Secondary | ICD-10-CM

## 2021-02-13 DIAGNOSIS — E1142 Type 2 diabetes mellitus with diabetic polyneuropathy: Secondary | ICD-10-CM | POA: Diagnosis not present

## 2021-02-13 DIAGNOSIS — B351 Tinea unguium: Secondary | ICD-10-CM

## 2021-02-17 ENCOUNTER — Other Ambulatory Visit: Payer: Self-pay

## 2021-02-17 ENCOUNTER — Encounter: Payer: Self-pay | Admitting: Dietician

## 2021-02-17 ENCOUNTER — Encounter: Payer: Medicare Other | Attending: Nurse Practitioner | Admitting: Dietician

## 2021-02-17 DIAGNOSIS — E1165 Type 2 diabetes mellitus with hyperglycemia: Secondary | ICD-10-CM | POA: Diagnosis not present

## 2021-02-17 NOTE — Progress Notes (Signed)
Diabetes Self-Management Education  Visit Type: Follow-up  Appt. Start Time: 0950 Appt. End Time: 1030  02/20/2021  [name], identified by name and date of birth, is a 66 y.o. female with a diagnosis of Diabetes:  .   ASSESSMENT Patient is here today alone.  She was last seen by this RD on 11/28/2020.  She walks her dog 30-60 minutes daily (except for days when it is cold or raining) or longer when the weather is best. Continues to smoke but is smoking less. Lantus and metformin were stopped as patient took these inconsistently. Trulicity was started and patient reports she is taking this each Tuesday.   Stopped sweet tea because she was using too much sugar in it and reduced regular soda to 1 per day.  Dislikes diet soda. She is eating out once per week compared to daily. States that food expense is a concern and does not get food stamps. Glucose was 167 this am  History includes:  Type 2 Diabetes, HTN, arthritis Medications include:  Metformin (stopped), Lantus (stopped), Trulicity Labs noted:  V9Y 7.3% 02/09/2021 decreased from 12.8%, eGFR 65, Cholesterol 78, HDL 32, LDL 22, Triglycerides 134 09/29/2020  Weight:   197 lbs 02/17/21 189 lbs 11/28/2020   Patient lives with her husband.  They share the shopping and cooking.  They eat out most of the time (fast food). They rely on public transportation. She states that some vegetables (broccoli, cauliflower, and squash s) don't agree with her - make her want to vomit.  She enjoys cabbage and turnip greens.   She is on disability.  Walks with a cane or walker.   She smokes and has tried to quit without success.  Weight 197 lb (89.4 kg). Body mass index is 34.9 kg/m.   Diabetes Self-Management Education - 02/20/21 0800       Visit Information   Visit Type Follow-up      Initial Visit   Are you taking your medications as prescribed? Yes      Pre-Education Assessment   Patient understands the diabetes disease and  treatment process. Needs Instruction    Patient understands incorporating nutritional management into lifestyle. Needs Review    Patient undertands incorporating physical activity into lifestyle. Demonstrates understanding / competency    Patient understands using medications safely. Demonstrates understanding / competency    Patient understands monitoring blood glucose, interpreting and using results Needs Review    Patient understands prevention, detection, and treatment of acute complications. Needs Review    Patient understands prevention, detection, and treatment of chronic complications. Needs Review    Patient understands how to develop strategies to address psychosocial issues. Needs Review    Patient understands how to develop strategies to promote health/change behavior. Needs Review      Complications   How often do you check your blood sugar? 1-2 times/day    Fasting Blood glucose range (mg/dL) 130-179      Dietary Intake   Breakfast egg and bolona sandwich    Snack (afternoon) chips    Dinner bologna, ham and tomato sandwich    Beverage(s) water, 1 regular soda per day      Exercise   Exercise Type Light (walking / raking leaves)    How many days per week to you exercise? 7    How many minutes per day do you exercise? 45    Total minutes per week of exercise 315      Patient Education   Previous Diabetes  Education Yes (please comment)   11/22   Nutrition management  Meal options for control of blood glucose level and chronic complications.;Food label reading, portion sizes and measuring food.;Role of diet in the treatment of diabetes and the relationship between the three main macronutrients and blood glucose level    Physical activity and exercise  Role of exercise on diabetes management, blood pressure control and cardiac health.   encouraged her to continue   Medications Reviewed patients medication for diabetes, action, purpose, timing of dose and side effects.     Monitoring Purpose and frequency of SMBG.    Psychosocial adjustment Identified and addressed patients feelings and concerns about diabetes;Worked with patient to identify barriers to care and solutions      Individualized Goals (developed by patient)   Nutrition General guidelines for healthy choices and portions discussed    Physical Activity Exercise 5-7 days per week;45 minutes per day    Medications take my medication as prescribed    Reducing Risk increase portions of healthy fats      Patient Self-Evaluation of Goals - Patient rates self as meeting previously set goals (% of time)   Nutrition 25 - 50%    Physical Activity >75%    Medications >75%    Monitoring >75%    Problem Solving >75%    Reducing Risk >75%    Health Coping >75%      Post-Education Assessment   Patient understands the diabetes disease and treatment process. Demonstrates understanding / competency    Patient understands incorporating nutritional management into lifestyle. Needs Review    Patient undertands incorporating physical activity into lifestyle. Demonstrates understanding / competency    Patient understands using medications safely. Needs Review    Patient understands monitoring blood glucose, interpreting and using results Needs Review    Patient understands prevention, detection, and treatment of acute complications. Demonstrates understanding / competency    Patient understands prevention, detection, and treatment of chronic complications. Demonstrates understanding / competency    Patient understands how to develop strategies to address psychosocial issues. Demonstrates understanding / competency    Patient understands how to develop strategies to promote health/change behavior. Needs Review      Outcomes   Expected Outcomes Other (comment)   interest but barriers   Future DMSE 2 months    Program Status Not Completed      Subsequent Visit   Since your last visit have you continued or begun to  take your medications as prescribed? Yes    Since your last visit have you experienced any weight changes? Gain    Weight Gain (lbs) 8             Individualized Plan for Diabetes Self-Management Training:   Learning Objective:  Patient will have a greater understanding of diabetes self-management. Patient education plan is to attend individual and/or group sessions per assessed needs and concerns.   Plan:   Patient Instructions  Continue to walk daily. Continue to decrease the butter in your cooking. Recommend continuing to decrease the amount of regular soda that you drink.  It is best to drink water and stop soda.  Patient Instructions  Boil your meat rather than fry. Reduce the amount of butter and oil to very small amounts. Eat more vegetables. Small portions of fruit. Avoid sweetened drinks.             Choose low fat milk              Drink  more water          Balance meals.  Aim for small amounts protein with each meal and snack. Half your plate should be non starchy vegetables A Quarter of the plate should be protein A Quarter of the plate can be a starch. You can have fruit or low fat milk with your meals if you desire.  Expected Outcomes:  Other (comment) (interest but barriers)  Education material provided: Food label handouts and My Plate  If problems or questions, patient to contact team via:  Phone  Future DSME appointment: 2 months

## 2021-02-17 NOTE — Patient Instructions (Addendum)
Continue to walk daily. Continue to decrease the butter in your cooking. Recommend continuing to decrease the amount of regular soda that you drink.  It is best to drink water and stop soda.  Patient Instructions  Boil your meat rather than fry. Reduce the amount of butter and oil to very small amounts. Eat more vegetables. Small portions of fruit. Avoid sweetened drinks.             Choose low fat milk              Drink more water          Balance meals.  Aim for small amounts protein with each meal and snack. Half your plate should be non starchy vegetables A Quarter of the plate should be protein A Quarter of the plate can be a starch. You can have fruit or low fat milk with your meals if you desire.

## 2021-02-20 ENCOUNTER — Encounter: Payer: Self-pay | Admitting: Podiatry

## 2021-02-20 NOTE — Progress Notes (Signed)
ANNUAL DIABETIC FOOT EXAM  Subjective: Christine Rocha presents today for for annual diabetic foot examination, at risk foot care with history of diabetic neuropathy, and callus(es) bilateral feet and painful thick toenails that are difficult to trim. Painful toenails interfere with ambulation. Aggravating factors include wearing enclosed shoe gear. Pain is relieved with periodic professional debridement. Painful calluses are aggravated when weightbearing with and without shoegear. Pain is relieved with periodic professional debridement..  Patient relates 3 year h/o diabetes.  Patient denies any h/o foot wounds.  Patient denies any numbness, tingling, burning, or pins/needle sensation in feet.  Patient does not remember her blood glucose reading from this morning.  Risk factors: diabetes, current tobacco user, HTN.  Christine Pounds, NP is patient's PCP. Last visit was 09/29/2020.  Past Medical History:  Diagnosis Date   Arthritis    Right Knee   Diabetes mellitus without complication ([place])    Hypertension    Seizures ([place])    Patient Active Problem List   Diagnosis Date Noted   Yeast infection of the skin 11/03/2020   Femur fracture, left ([place]) 04/23/2017   Preventative health care 10/28/2014   Encounter for screening mammogram for breast cancer 10/28/2014   Primary osteoarthritis of left knee 10/28/2014   Essential hypertension 11/15/2013   Diabetes ([place]) 11/15/2013   Uncontrolled hypertension 10/11/2012   Smoking addiction 10/11/2012   Past Surgical History:  Procedure Laterality Date   COLONOSCOPY  2021   ORIF FEMUR FRACTURE Left 04/24/2017   Procedure: OPEN REDUCTION INTERNAL FIXATION (ORIF) DISTAL FEMUR FRACTURE;  Surgeon: [name], MD;  Location: [facility];  Service: Orthopedics;  Laterality: Left;   Current Outpatient Medications on File Prior to Visit  Medication Sig Dispense Refill   amLODipine (NORVASC) 10 MG tablet Take 1 tablet (10 mg total) by mouth daily.  To lower blood pressure 90 tablet 1   atenolol (TENORMIN) 100 MG tablet Take 1 tablet (100 mg total) by mouth daily. 90 tablet 1   Blood Glucose Monitoring Suppl (CONTOUR NEXT EZ) w/Device KIT Use to check blood sugar once daily. 1 kit 0   buPROPion (WELLBUTRIN SR) 150 MG 12 hr tablet Take 1 tablet (150 mg total) by mouth 2 (two) times daily. = 180 tablet 0   carboxymethylcellulose (REFRESH PLUS) 0.5 % SOLN Place 1 drop into both eyes daily as needed (dry eyes).     ciclopirox (PENLAC) 8 % solution APPLY ONE COAT TO AFFECTED TONAILS EVERY DAY. REMOVE WEEKLY WITH POLISH REMOVER 6.6 mL 11   Dulaglutide (TRULICITY) 3.66 QH/4.7ML SOPN Inject 0.75 mg into the skin once a week. 2 mL 2   glucose blood (CONTOUR NEXT TEST) test strip Use to check blood sugar once daily. 100 strip 0   lisinopril (ZESTRIL) 10 MG tablet Take 1 tablet (10 mg total) by mouth daily. 90 tablet 3   magnesium citrate SOLN Take 296 mLs (1 Bottle total) by mouth once as needed for severe constipation. 195 mL    Microlet Lancets MISC Use to check blood sugar once daily. 100 each 0   Multiple Vitamins-Minerals (WOMENS 50+ MULTI VITAMIN/MIN) TABS Take 1 tablet by mouth daily.     nystatin (MYCOSTATIN/NYSTOP) powder Apply 1 application topically 3 (three) times daily. 30 g 1   nystatin-triamcinolone ointment (MYCOLOG) Apply 1 application topically 2 (two) times daily. 60 g 0   polyethylene glycol (MIRALAX / GLYCOLAX) 17 g packet Take 17 g by mouth daily as needed. Mixed into 16 or more ounces of  fluid 30 each 6   rosuvastatin (CRESTOR) 10 MG tablet Take 1 tablet (10 mg total) by mouth daily. To lower cholesterol 90 tablet 3   senna-docusate (SENOKOT-S) 8.6-50 MG tablet Take 1 tablet by mouth 2 (two) times daily.     No current facility-administered medications on file prior to visit.    Allergies  Allergen Reactions   Penicillins Nausea Only   Social History   Occupational History   Not on file  Tobacco Use   Smoking status:  Every Day    Packs/day: 0.25    Years: 49.00    Pack years: 12.25    Types: Cigarettes   Smokeless tobacco: Never  Vaping Use   Vaping Use: Never used  Substance and Sexual Activity   Alcohol use: No   Drug use: No   Sexual activity: Yes    Birth control/protection: None   Family History  Problem Relation Age of Onset   Hypertension Mother    Breast cancer Mother    Hypertension Sister    Diabetes Sister    Immunization History  Administered Date(s) Administered   Influenza Split 09/13/2012   Influenza,inj,Quad PF,6+ Mos 11/15/2013, 10/28/2014, 10/11/2016, 09/28/2018, 09/29/2020   Influenza-Unspecified 10/11/2016   PFIZER(Purple Top)SARS-COV-2 Vaccination 06/01/2019, 06/22/2019     Review of Systems: Negative except as noted in the HPI.   Objective: There were no vitals filed for this visit.  Christine Rocha is a pleasant 65 y.o. female in NAD. AAO X 3.  Vascular Examination: CFT immediate b/l LE. Palpable DP/PT pulses b/l LE. Digital hair sparse b/l. Skin temperature gradient WNL b/l. No pain with calf compression b/l. No edema noted b/l. No cyanosis or clubbing noted b/l LE.  Dermatological Examination: Pedal integument with normal turgor, texture and tone BLE. No open wounds b/l LE. No interdigital macerations noted b/l LE. Toenails 1-5 b/l elongated, discolored, dystrophic, thickened, crumbly with subungual debris and tenderness to dorsal palpation. Hyperkeratotic lesion(s) bilateral heels and bilateral great toes.  No erythema, no edema, no drainage, no fluctuance.  Musculoskeletal Examination: Muscle strength 5/5 to all lower extremity muscle groups bilaterally. No pain, crepitus or joint limitation noted with ROM bilateral LE. No gross bony deformities bilaterally.  Footwear Assessment: Does the patient wear appropriate shoes? Yes. Does the patient need inserts/orthotics? No.  Neurological Examination: Protective sensation decreased with 10 gram monofilament  b/l. Vibratory sensation intact b/l.  Hemoglobin A1C Latest Ref Rng & Units 02/09/2021 09/29/2020 05/06/2020  HGBA1C 0.0 - 7.0 % 7.3(A) 12.8(A) 9.4(H)  Some recent data might be hidden   Assessment: 1. Pain due to onychomycosis of toenail   2. Callus   3. Diabetic peripheral neuropathy associated with type 2 diabetes mellitus ([place])   4. Encounter for diabetic foot exam ([place])     ADA Risk Categorization: High Risk  Patient has one or more of the following: Loss of protective sensation Absent pedal pulses Severe Foot deformity History of foot ulcer  Plan: -Diabetic foot examination performed today. -Continue foot and shoe inspections daily. Monitor blood glucose per PCP/Endocrinologist's recommendations. -Mycotic toenails 1-5 bilaterally were debrided in length and girth with sterile nail nippers and dremel without incident. -Callus(es) bilateral great toes pared utilizing sterile scalpel blade without complication or incident. Total number debrided =2. -Callus(es) bilateral heels pared utilizing mandrel sander without complication or incident. Total number debrided =2. -Patient/POA to call should there be question/concern in the interim.  Return in about 3 months (around 05/13/2021).  Marzetta Board, DPM

## 2021-02-23 ENCOUNTER — Telehealth: Payer: Self-pay | Admitting: Family Medicine

## 2021-02-24 NOTE — Telephone Encounter (Signed)
Dose inconsistent with current med list, please assess.  Requested Prescriptions  Pending Prescriptions Disp Refills   TRULICITY 1.5 FO/2.7XA SOPN [Pharmacy Med Name: Trulicity Subcutaneous Solution Pen-injector 1.5 MG/0.5ML]  0    Sig: INJECT 1.5 MG (0.5ML) UNDER THE SKIN ONCE A WEEK     Endocrinology:  Diabetes - GLP-1 Receptor Agonists Passed - 02/23/2021  8:04 AM      Passed - HBA1C is between 0 and 7.9 and within 180 days    HbA1c, POC (controlled diabetic range)  Date Value Ref Range Status  02/09/2021 7.3 (A) 0.0 - 7.0 % Final         Passed - Valid encounter within last 6 months    Recent Outpatient Visits          2 weeks ago Type 2 diabetes mellitus with hyperglycemia, without long-term current use of insulin [facility])   [place], Stephen L, RPH-CPP   1 month ago Type 2 diabetes mellitus with hyperglycemia, without long-term current use of insulin [facility])   [place], Annie Main L, RPH-CPP   2 months ago Essential hypertension   [place], Annie Main L, RPH-CPP   2 months ago Type 2 diabetes mellitus with hyperglycemia, without long-term current use of insulin [facility])   [place], Annie Main L, RPH-CPP   2 months ago Type 2 diabetes mellitus with hyperglycemia, without long-term current use of insulin [facility])   [place], Jarome Matin, RPH-CPP      Future Appointments            In 3 days Gildardo Pounds, NP [place]

## 2021-02-24 NOTE — Telephone Encounter (Signed)
Refused due to dose inconsistent with current med list.  Requested Prescriptions  Pending Prescriptions Disp Refills   TRULICITY 1.5 ZO/1.0RU SOPN [Pharmacy Med Name: Trulicity Subcutaneous Solution Pen-injector 1.5 MG/0.5ML]  0    Sig: INJECT 1.5 MG (0.5ML) UNDER THE SKIN ONCE A WEEK     Endocrinology:  Diabetes - GLP-1 Receptor Agonists Passed - 02/23/2021  8:04 AM      Passed - HBA1C is between 0 and 7.9 and within 180 days    HbA1c, POC (controlled diabetic range)  Date Value Ref Range Status  02/09/2021 7.3 (A) 0.0 - 7.0 % Final          Passed - Valid encounter within last 6 months    Recent Outpatient Visits           2 weeks ago Type 2 diabetes mellitus with hyperglycemia, without long-term current use of insulin ([place])   [place], Stephen L, RPH-CPP   1 month ago Type 2 diabetes mellitus with hyperglycemia, without long-term current use of insulin [facility])   [place], Annie Main L, RPH-CPP   2 months ago Essential hypertension   [place], Annie Main L, RPH-CPP   2 months ago Type 2 diabetes mellitus with hyperglycemia, without long-term current use of insulin [facility])   [place], Annie Main L, RPH-CPP   2 months ago Type 2 diabetes mellitus with hyperglycemia, without long-term current use of insulin [facility])   [place], RPH-CPP       Future Appointments             In 3 days Gildardo Pounds, NP [place]

## 2021-02-25 ENCOUNTER — Other Ambulatory Visit: Payer: Self-pay

## 2021-02-25 MED ORDER — TRULICITY 0.75 MG/0.5ML ~~LOC~~ SOAJ
0.7500 mg | SUBCUTANEOUS | 2 refills | Status: DC
  Filled 2021-02-25: qty 2, 28d supply, fill #0

## 2021-02-25 NOTE — Addendum Note (Signed)
Addended by: Daisy Blossom, Annie Main L on: 02/25/2021 08:40 AM   Modules accepted: Orders

## 2021-02-25 NOTE — Telephone Encounter (Signed)
Called and confirmed with pharmacy that she has been filling the 0.75mg  weekly dose. Refills for 0.75mg  pens sent to our pharmacy.

## 2021-02-27 ENCOUNTER — Encounter: Payer: Self-pay | Admitting: Nurse Practitioner

## 2021-02-27 ENCOUNTER — Other Ambulatory Visit: Payer: Self-pay

## 2021-02-27 ENCOUNTER — Ambulatory Visit: Payer: Medicare Other | Attending: Nurse Practitioner | Admitting: Nurse Practitioner

## 2021-02-27 VITALS — BP 147/81 | HR 79 | Resp 16 | Wt 197.4 lb

## 2021-02-27 DIAGNOSIS — D171 Benign lipomatous neoplasm of skin and subcutaneous tissue of trunk: Secondary | ICD-10-CM | POA: Diagnosis not present

## 2021-02-27 DIAGNOSIS — F17210 Nicotine dependence, cigarettes, uncomplicated: Secondary | ICD-10-CM

## 2021-02-27 DIAGNOSIS — E1165 Type 2 diabetes mellitus with hyperglycemia: Secondary | ICD-10-CM

## 2021-02-27 DIAGNOSIS — F322 Major depressive disorder, single episode, severe without psychotic features: Secondary | ICD-10-CM | POA: Diagnosis not present

## 2021-02-27 DIAGNOSIS — I10 Essential (primary) hypertension: Secondary | ICD-10-CM

## 2021-02-27 DIAGNOSIS — F17200 Nicotine dependence, unspecified, uncomplicated: Secondary | ICD-10-CM

## 2021-02-27 LAB — GLUCOSE, POCT (MANUAL RESULT ENTRY): POC Glucose: 138 mg/dl — AB (ref 70–99)

## 2021-02-27 MED ORDER — BUPROPION HCL ER (SR) 150 MG PO TB12: 150.0000 mg | ORAL_TABLET | Freq: Two times a day (BID) | ORAL | 0 refills | Status: DC

## 2021-02-27 MED ORDER — TRULICITY 0.75 MG/0.5ML ~~LOC~~ SOAJ: 0.7500 mg | SUBCUTANEOUS | 2 refills | Status: DC

## 2021-02-27 NOTE — Progress Notes (Signed)
Assessment & Plan:  Christine Rocha was seen today for hypertension.  Diagnoses and all orders for this visit:  Type 2 diabetes mellitus with hyperglycemia, without long-term current use of insulin (HCC) -     POCT glucose (manual entry) -     CMP14+EGFR -     Dulaglutide (TRULICITY) 4.49 QP/5.9FM SOPN; Inject 0.75 mg into the skin once a week. Continue blood sugar control as discussed in office today, low carbohydrate diet, and regular physical exercise as tolerated, 150 minutes per week (30 min each day, 5 days per week, or 50 min 3 days per week). Keep blood sugar logs with fasting goal of 90-130 mg/dl, post prandial (after you eat) less than 180.  For Hypoglycemia: BS <60 and Hyperglycemia BS >400; contact the clinic ASAP. Annual eye exams and foot exams are recommended.   Essential hypertension Continue amlodipine and lisinopril as prescribed.  STOP SMOKING  Lipoma of torso -     Ambulatory referral to Dermatology  Tobacco dependence -     buPROPion (WELLBUTRIN SR) 150 MG 12 hr tablet; Take 1 tablet (150 mg total) by mouth 2 (two) times daily. To stop smoking Will also be taken for Depression symptoms.  [place] continues to smoke 1/2 pack cigarettes per day. 2. [name] was counseled on the dangers of tobacco use, and was advised to quit. We reviewed specific strategies to maximize success, including removing cigarettes and smoking materials from environment, stress management and support of family/friends as well as pharmacological alternatives. 3. A total of 3 minutes was spent on counseling for smoking cessation and [name] is ready to quit and has chosen BUPROPRION to start today.  4. [name] was offered Wellbutrin, Chantix, Nicotine patch, Nicotine gum or lozenges.  Due to out of pocket costs [place] was also given smoking cessation support and advised to contact: the Smoking Cessation hotline: 1-800-QUIT-NOW.  [name] was also informed of our Smoking cessation classes which are also available through  [facility] by calling [phone] or visit our website at [email or web address]  5. Will follow up at next scheduled office visit.     Patient has been counseled on age-appropriate routine health concerns for screening and prevention. These are reviewed and up-to-date. Referrals have been placed accordingly. Immunizations are up-to-date or declined.    Subjective:   Chief Complaint  Patient presents with   Hypertension   HPI Christine Rocha 66 y.o. female presents to office today for follow up to HTN She has a past medical history of Arthritis, DM2, Hypertension, and Seizures    HTN Not taking her blood pressure medication as prescribed. She is currently prescribed amlodipine 10 mg, atenolol 100 mg,  and lisinopril 10 mg daily.  BP Readings from Last 3 Encounters:  02/27/21 (!) 147/81  02/09/21 (!) 195/116  11/03/20 137/77     DM 2 Not at goal. Reports average home readings 150s fasting. We did discuss dietary modifications and goal blood glucose levels.  Lab Results  Component Value Date   HGBA1C 7.3 (A) 02/09/2021    Left shoulder lipoma Needs dermatology referral. Lipoma is above trapezius region    Review of Systems  Constitutional:  Negative for fever, malaise/fatigue and weight loss.  HENT: Negative.  Negative for nosebleeds.   Eyes: Negative.  Negative for blurred vision, double vision and photophobia.  Respiratory: Negative.  Negative for cough and shortness of breath.   Cardiovascular: Negative.  Negative for chest pain, palpitations and leg swelling.  Gastrointestinal: Negative.  Negative for heartburn, nausea and vomiting.  Musculoskeletal: Negative.  Negative for myalgias.  Skin:        See HPI  Neurological: Negative.  Negative for dizziness, focal weakness, seizures and headaches.  Psychiatric/Behavioral: Negative.  Negative for suicidal ideas.    Past Medical History:  Diagnosis Date   Arthritis    Right Knee   Diabetes  mellitus without complication ([place])    Hypertension    Seizures ([place])     Past Surgical History:  Procedure Laterality Date   COLONOSCOPY  2021   ORIF FEMUR FRACTURE Left 04/24/2017   Procedure: OPEN REDUCTION INTERNAL FIXATION (ORIF) DISTAL FEMUR FRACTURE;  Surgeon: [name], MD;  Location: [place];  Service: Orthopedics;  Laterality: Left;    Family History  Problem Relation Age of Onset   Hypertension Mother    Breast cancer Mother    Hypertension Sister    Diabetes Sister     Social History Reviewed with no changes to be made today.   Outpatient Medications Prior to Visit  Medication Sig Dispense Refill   amLODipine (NORVASC) 10 MG tablet Take 1 tablet (10 mg total) by mouth daily. To lower blood pressure 90 tablet 1   atenolol (TENORMIN) 100 MG tablet Take 1 tablet (100 mg total) by mouth daily. 90 tablet 1   lisinopril (ZESTRIL) 10 MG tablet Take 1 tablet (10 mg total) by mouth daily. 90 tablet 3   rosuvastatin (CRESTOR) 10 MG tablet Take 1 tablet (10 mg total) by mouth daily. To lower cholesterol 90 tablet 3   Blood Glucose Monitoring Suppl (CONTOUR NEXT EZ) w/Device KIT Use to check blood sugar once daily. (Patient not taking: Reported on 02/27/2021) 1 kit 0   ciclopirox (PENLAC) 8 % solution APPLY ONE COAT TO AFFECTED TONAILS EVERY DAY. REMOVE WEEKLY WITH POLISH REMOVER (Patient not taking: Reported on 02/27/2021) 6.6 mL 11   glucose blood (CONTOUR NEXT TEST) test strip Use to check blood sugar once daily. (Patient not taking: Reported on 02/27/2021) 100 strip 0   Microlet Lancets MISC Use to check blood sugar once daily. (Patient not taking: Reported on 02/27/2021) 100 each 0   Multiple Vitamins-Minerals (WOMENS 50+ MULTI VITAMIN/MIN) TABS Take 1 tablet by mouth daily. (Patient not taking: Reported on 02/27/2021)     polyethylene glycol (MIRALAX / GLYCOLAX) 17 g packet Take 17 g by mouth daily as needed. Mixed into 16 or more ounces of fluid (Patient not taking: Reported on  02/27/2021) 30 each 6   buPROPion (WELLBUTRIN SR) 150 MG 12 hr tablet Take 1 tablet (150 mg total) by mouth 2 (two) times daily. = (Patient not taking: Reported on 02/27/2021) 180 tablet 0   carboxymethylcellulose (REFRESH PLUS) 0.5 % SOLN Place 1 drop into both eyes daily as needed (dry eyes). (Patient not taking: Reported on 02/27/2021)     Dulaglutide (TRULICITY) 5.40 GQ/6.7YP SOPN Inject 0.75 mg into the skin once a week. (Patient not taking: Reported on 02/27/2021) 2 mL 2   magnesium citrate SOLN Take 296 mLs (1 Bottle total) by mouth once as needed for severe constipation. (Patient not taking: Reported on 02/27/2021) 195 mL    nystatin (MYCOSTATIN/NYSTOP) powder Apply 1 application topically 3 (three) times daily. (Patient not taking: Reported on 02/27/2021) 30 g 1   nystatin-triamcinolone ointment (MYCOLOG) Apply 1 application topically 2 (two) times daily. (Patient not taking: Reported on 02/27/2021) 60 g 0   senna-docusate (SENOKOT-S) 8.6-50 MG tablet Take 1 tablet by mouth 2 (two) times  daily. (Patient not taking: Reported on 02/27/2021)     No facility-administered medications prior to visit.    Allergies  Allergen Reactions   Penicillins Nausea Only       Objective:    BP (!) 147/81    Pulse 79    Resp 16    Wt 197 lb 6.4 oz (89.5 kg)    SpO2 97%    BMI 34.97 kg/m  Wt Readings from Last 3 Encounters:  02/27/21 197 lb 6.4 oz (89.5 kg)  02/20/21 197 lb (89.4 kg)  11/28/20 189 lb (85.7 kg)    Physical Exam Vitals and nursing note reviewed.  Constitutional:      Appearance: She is well-developed.  HENT:     Head: Normocephalic and atraumatic.  Cardiovascular:     Rate and Rhythm: Normal rate and regular rhythm.     Heart sounds: Normal heart sounds. No murmur heard.   No friction rub. No gallop.  Pulmonary:     Effort: Pulmonary effort is normal. No tachypnea or respiratory distress.     Breath sounds: Normal breath sounds. No decreased breath sounds, wheezing, rhonchi or  rales.  Chest:     Chest wall: No tenderness.  Abdominal:     General: Bowel sounds are normal.     Palpations: Abdomen is soft.  Musculoskeletal:        General: Normal range of motion.     Cervical back: Normal range of motion.  Skin:    General: Skin is warm and dry.  Neurological:     Mental Status: She is alert and oriented to person, place, and time.     Coordination: Coordination normal.  Psychiatric:        Behavior: Behavior normal. Behavior is cooperative.        Thought Content: Thought content normal.        Judgment: Judgment normal.         Patient has been counseled extensively about nutrition and exercise as well as the importance of adherence with medications and regular follow-up. The patient was given clear instructions to go to ER or return to medical center if symptoms don't improve, worsen or new problems develop. The patient verbalized understanding.   Follow-up: Return in 11 weeks (on 05/15/2021) for DM HTN.   Gildardo Pounds, FNP-BC [facility] [place], [place]   03/04/2021, 2:33 PM

## 2021-02-28 LAB — CMP14+EGFR
ALT: 28 IU/L (ref 0–32)
AST: 23 IU/L (ref 0–40)
Albumin/Globulin Ratio: 2 (ref 1.2–2.2)
Albumin: 4.7 g/dL (ref 3.8–4.8)
Alkaline Phosphatase: 87 IU/L (ref 44–121)
BUN/Creatinine Ratio: 8 — ABNORMAL LOW (ref 12–28)
BUN: 6 mg/dL — ABNORMAL LOW (ref 8–27)
Bilirubin Total: <0.2 mg/dL (ref 0.0–1.2)
CO2: 25 mmol/L (ref 20–29)
Calcium: 9.7 mg/dL (ref 8.7–10.3)
Chloride: 103 mmol/L (ref 96–106)
Creatinine, Ser: 0.75 mg/dL (ref 0.57–1.00)
Globulin, Total: 2.4 g/dL (ref 1.5–4.5)
Glucose: 140 mg/dL — ABNORMAL HIGH (ref 70–99)
Potassium: 3.7 mmol/L (ref 3.5–5.2)
Sodium: 143 mmol/L (ref 134–144)
Total Protein: 7.1 g/dL (ref 6.0–8.5)
eGFR: 89 mL/min/{1.73_m2} (ref >59)

## 2021-03-04 ENCOUNTER — Other Ambulatory Visit: Payer: Self-pay

## 2021-03-04 ENCOUNTER — Encounter: Payer: Self-pay | Admitting: Nurse Practitioner

## 2021-03-04 DIAGNOSIS — F322 Major depressive disorder, single episode, severe without psychotic features: Secondary | ICD-10-CM | POA: Insufficient documentation

## 2021-03-09 DIAGNOSIS — E118 Type 2 diabetes mellitus with unspecified complications: Secondary | ICD-10-CM | POA: Diagnosis not present

## 2021-03-12 ENCOUNTER — Ambulatory Visit: Payer: Self-pay | Admitting: *Deleted

## 2021-03-12 NOTE — Telephone Encounter (Signed)
Second attempt to call patient- left message to call office. 

## 2021-03-12 NOTE — Telephone Encounter (Signed)
Third attempt to reach, message left. ?

## 2021-03-12 NOTE — Telephone Encounter (Signed)
Left message, will attempt again later. ?

## 2021-04-08 DIAGNOSIS — E118 Type 2 diabetes mellitus with unspecified complications: Secondary | ICD-10-CM | POA: Diagnosis not present

## 2021-04-11 ENCOUNTER — Other Ambulatory Visit: Payer: Self-pay | Admitting: Family Medicine

## 2021-04-11 DIAGNOSIS — E1165 Type 2 diabetes mellitus with hyperglycemia: Secondary | ICD-10-CM

## 2021-04-13 ENCOUNTER — Encounter (HOSPITAL_COMMUNITY): Payer: Self-pay | Admitting: *Deleted

## 2021-04-13 ENCOUNTER — Other Ambulatory Visit: Payer: Self-pay

## 2021-04-13 ENCOUNTER — Encounter: Payer: Self-pay | Admitting: Dietician

## 2021-04-13 ENCOUNTER — Emergency Department (HOSPITAL_COMMUNITY)
Admission: EM | Admit: 2021-04-13 | Discharge: 2021-04-13 | Disposition: A | Discharge status: Home / Self Care | Payer: Medicare Other | Attending: Emergency Medicine | Admitting: Emergency Medicine

## 2021-04-13 ENCOUNTER — Encounter: Payer: Medicare Other | Attending: Nurse Practitioner | Admitting: Dietician

## 2021-04-13 DIAGNOSIS — R5383 Other fatigue: Secondary | ICD-10-CM | POA: Diagnosis not present

## 2021-04-13 DIAGNOSIS — Z6833 Body mass index (BMI) 33.0-33.9, adult: Secondary | ICD-10-CM | POA: Insufficient documentation

## 2021-04-13 DIAGNOSIS — R739 Hyperglycemia, unspecified: Secondary | ICD-10-CM | POA: Insufficient documentation

## 2021-04-13 DIAGNOSIS — Z794 Long term (current) use of insulin: Secondary | ICD-10-CM | POA: Insufficient documentation

## 2021-04-13 DIAGNOSIS — E1165 Type 2 diabetes mellitus with hyperglycemia: Secondary | ICD-10-CM | POA: Diagnosis not present

## 2021-04-13 DIAGNOSIS — R42 Dizziness and giddiness: Secondary | ICD-10-CM | POA: Diagnosis not present

## 2021-04-13 DIAGNOSIS — Z713 Dietary counseling and surveillance: Secondary | ICD-10-CM | POA: Insufficient documentation

## 2021-04-13 DIAGNOSIS — R350 Frequency of micturition: Secondary | ICD-10-CM | POA: Diagnosis not present

## 2021-04-13 LAB — URINALYSIS, ROUTINE W REFLEX MICROSCOPIC
Bilirubin Urine: NEGATIVE
Glucose, UA: >=500 mg/dL — AB
Ketones, ur: 5 mg/dL — AB
Nitrite: NEGATIVE
Protein, ur: NEGATIVE mg/dL
Specific Gravity, Urine: 1.033 — ABNORMAL HIGH (ref 1.005–1.030)
pH: 6 (ref 5.0–8.0)

## 2021-04-13 LAB — BASIC METABOLIC PANEL
Anion gap: 11 (ref 5–15)
BUN: 7 mg/dL — ABNORMAL LOW (ref 8–23)
CO2: 23 mmol/L (ref 22–32)
Calcium: 9.6 mg/dL (ref 8.9–10.3)
Chloride: 99 mmol/L (ref 98–111)
Creatinine, Ser: 0.95 mg/dL (ref 0.44–1.00)
GFR, Estimated: >60 mL/min (ref >60)
Glucose, Bld: 572 mg/dL (ref 70–99)
Potassium: 3.9 mmol/L (ref 3.5–5.1)
Sodium: 133 mmol/L — ABNORMAL LOW (ref 135–145)

## 2021-04-13 LAB — CBG MONITORING, ED
Glucose-Capillary: 558 mg/dL (ref 70–99)
Glucose-Capillary: 428 mg/dL — ABNORMAL HIGH (ref 70–99)
Glucose-Capillary: 308 mg/dL — ABNORMAL HIGH (ref 70–99)
Glucose-Capillary: 220 mg/dL — ABNORMAL HIGH (ref 70–99)

## 2021-04-13 LAB — CBC
HCT: 41.6 % (ref 36.0–46.0)
Hemoglobin: 14.1 g/dL (ref 12.0–15.0)
MCH: 29.6 pg (ref 26.0–34.0)
MCHC: 33.9 g/dL (ref 30.0–36.0)
MCV: 87.4 fL (ref 80.0–100.0)
Platelets: 240 10*3/uL (ref 150–400)
RBC: 4.76 MIL/uL (ref 3.87–5.11)
RDW: 12.1 % (ref 11.5–15.5)
WBC: 5.4 10*3/uL (ref 4.0–10.5)
nRBC: 0 % (ref 0.0–0.2)

## 2021-04-13 MED ORDER — INSULIN GLARGINE-YFGN 100 UNIT/ML ~~LOC~~ SOLN
10.0000 [IU] | Freq: Once | SUBCUTANEOUS | Status: AC
  Administered 2021-04-13: 10 [IU] via SUBCUTANEOUS
  Filled 2021-04-13: qty 0.1

## 2021-04-13 MED ORDER — SODIUM CHLORIDE 0.9 % IV BOLUS
1000.0000 mL | Freq: Once | INTRAVENOUS | Status: AC
  Administered 2021-04-13: 1000 mL via INTRAVENOUS

## 2021-04-13 MED ORDER — INSULIN ASPART 100 UNIT/ML IJ SOLN
8.0000 [IU] | Freq: Once | INTRAMUSCULAR | Status: AC
  Administered 2021-04-13: 8 [IU] via INTRAVENOUS

## 2021-04-13 NOTE — Progress Notes (Signed)
? ?Diabetes Self-Management Education ? ?Visit Type: Follow-up ? ?Appt. Start Time: 0810 Appt. End Time: 9629 ? ?04/13/2021 ? ?Christine Rocha, identified by name and date of birth, is a 66 y.o. female with a diagnosis of Diabetes:  .  ? ?ASSESSMENT ?Patient is here today alone.  She was last seen by this RD on 02/17/2021. ? ?She has been keeping records of her blood glucose and blood pressure.  Praised her for this. ?Blood pressure is overall well controlled 120/70 today. ?Blood sugar is uncontrolled with fasting readings between 155 and 586 and HS readings 168-600. ?Fasting blood glucose this am 458 and rechecked in office >600 (after bottle of regular Mt. Dew and no breakfast).  Messaged her NP and also walked her to her [place] who instructed her to go to the ER.  She is currently at the ER. ?She is currently taking no diabetes medications. ?She states that her vision is currently very poor and I told her this is due to her high glucose. ?She ran out of Trulicity (once a week injectable) about 2 weeks ago. ?Reduced cigarettes to 4-5 per day. ?Walks the dog 2-4 hours daily. ? ?History includes:  Type 2 Diabetes, HTN, arthritis ?Medications include:  Metformin (stopped), Lantus (stopped), Trulicity(ran out and not taking) ?Labs noted:  A1C 7.3% 02/09/2021 decreased from 12.8%, eGFR 65, Cholesterol 78, HDL 32, LDL 22, Triglycerides 134 09/29/2020 ?  ?Weight:   ?190 lbs 04/13/2021 (lost with uncontrolled glucose) ?197 lbs 02/17/21 ?189 lbs 11/28/2020 ?  ?Patient lives with her husband.  They share the shopping and cooking.  They eat out most of the time (fast food) but has changed to TV dinners most frequently since diabetes diagnosis. They rely on public transportation. ?She states that some vegetables (broccoli, cauliflower, and squash s) don't agree with her - make her want to vomit.  She enjoys cabbage and turnip greens.   ?She is on disability.  Walks with a cane or walker.   ?She  smokes and has tried to quit without success. ? ?Weight 190 lb (86.2 kg). ?Body mass index is 33.66 kg/m?. ? ? Diabetes Self-Management Education - 04/13/21 0900   ? ?  ? Visit Information  ? Visit Type Follow-up   ?  ? Initial Visit  ? Are you currently following a meal plan? No   ? Are you taking your medications as prescribed? No   ?  ? Psychosocial Assessment  ? Patient Belief/Attitude about Diabetes Afraid   ? Self-care barriers Low literacy;Lack of transportation;Lack of material resources   ? Self-management support Doctor's office;CDE visits   ? Other persons present Patient   ? Patient Concerns Glycemic Control   ? Special Needs Simplified materials   ? Preferred Learning Style No preference indicated   ?  ? Pre-Education Assessment  ? Patient understands the diabetes disease and treatment process. Needs Review   ? Patient understands incorporating nutritional management into lifestyle. Needs Review   ? Patient undertands incorporating physical activity into lifestyle. Demonstrates understanding / competency   ? Patient understands using medications safely. Needs Review   ? Patient understands monitoring blood glucose, interpreting and using results Demonstrates understanding / competency   ? Patient understands prevention, detection, and treatment of acute complications. Needs Review   ? Patient understands prevention, detection, and treatment of chronic complications. Needs Review   ? Patient understands how to develop strategies to address psychosocial issues. Needs Review   ? Patient understands  how to develop strategies to promote health/change behavior. Needs Review   ?  ? Complications  ? Last HgB A1C per patient/outside source 7.3 %   02/09/2021  ? How often do you check your blood sugar? 1-2 times/day   ? Fasting Blood glucose range (mg/dL) 130-179;180-200;>200   ? Postprandial Blood glucose range (mg/dL) 130-179;180-200;>200   ? Number of hypoglycemic episodes per month 0   ? Number of  hyperglycemic episodes per week 21   ?  ? Dietary Intake  ? Breakfast egg, occasional sausage, brown bread   ? Lunch TV dinner   ? Dinner fruit   ? Beverage(s) water, regular soda   ?  ? Exercise  ? Exercise Type Light (walking / raking leaves)   ? How many days per week to you exercise? 7   ? How many minutes per day do you exercise? 120   ? Total minutes per week of exercise 840   ?  ? Patient Education  ? Previous Diabetes Education Yes (please comment)   ? Nutrition management  Other (comment)   beverage choice  ? Monitoring Purpose and frequency of SMBG.   ? Psychosocial adjustment Worked with patient to identify barriers to care and solutions   ?  ? Individualized Goals (developed by patient)  ? Nutrition General guidelines for healthy choices and portions discussed   ? Physical Activity Exercise 5-7 days per week;60 minutes per day   ? Reducing Risk increase portions of healthy fats;stop smoking;examine blood glucose patterns   ?  ? Patient Self-Evaluation of Goals - Patient rates self as meeting previously set goals (% of time)  ? Nutrition 50 - 75 %   ? Physical Activity >75%   ? Medications < 25%   ? Monitoring >75%   ? Problem Solving 50 - 75 %   ? Reducing Risk >75%   ? Health Coping >75%   ?  ? Post-Education Assessment  ? Patient understands the diabetes disease and treatment process. Demonstrates understanding / competency   ? Patient understands incorporating nutritional management into lifestyle. Needs Review   ? Patient undertands incorporating physical activity into lifestyle. Demonstrates understanding / competency   ? Patient understands using medications safely. Needs Review   ? Patient understands monitoring blood glucose, interpreting and using results Needs Review   ? Patient understands prevention, detection, and treatment of acute complications. Needs Review   ? Patient understands prevention, detection, and treatment of chronic complications. Needs Review   ? Patient understands how to  develop strategies to address psychosocial issues. Needs Review   ? Patient understands how to develop strategies to promote health/change behavior. Needs Review   ?  ? Outcomes  ? Expected Outcomes Other (comment)   interest in learning but barriers  ? Future DMSE 2 months   ? Program Status Not Completed   ?  ? Subsequent Visit  ? Since your last visit have you continued or begun to take your medications as prescribed? No   ? Since your last visit have you had your blood pressure checked? Yes   ? Is your most recent blood pressure lower, unchanged, or higher since your last visit? Lower   ? Since your last visit have you experienced any weight changes? Loss   ? Weight Loss (lbs) 7   ? ?  ?  ? ?  ? ? ?Individualized Plan for Diabetes Self-Management Training:  ? ?Learning Objective:  Patient will have a greater understanding of diabetes self-management. ?Patient  education plan is to attend individual and/or group sessions per assessed needs and concerns. ?  ?Plan:  ? ?Patient Instructions  ?Call your pharmacy about the Trulicity.  Please take your medications as prescribed. ? ?Boil your meat rather than fry. ?Reduce the amount of butter and oil to very small amounts. ?Eat more vegetables. ?Small portions of fruit. ?Avoid sweetened drinks. ?            Choose low fat milk  ?            Drink more water ?         ?Balance meals.  Aim for small amounts protein with each meal and snack. ?Half your plate should be non starchy vegetables ?A Quarter of the plate should be protein ?A Quarter of the plate can be a starch. ?You can have fruit or low fat milk with your meals if you desire. ? ?Expected Outcomes:  Other (comment) (interest in learning but barriers) ? ?Education material provided:  ? ?If problems or questions, patient to contact team via:  Phone ? ?Future DSME appointment: 2 months ?

## 2021-04-13 NOTE — ED Triage Notes (Signed)
Pt reports going to md office this morning and sent here due to cbg > 600. Was taken off her insulin approx 1 month ago. Has been having dizziness, unable to sleep and lack of appetite. No acute distress is noted at triage. ?

## 2021-04-13 NOTE — ED Provider Notes (Signed)
Patient care signed out to follow-up repeat blood glucose after repeat insulin.  Patient's repeat blood glucose 220.  Labs reviewed no signs of acidosis.  Patient has medications and follow-up outpatient.  Patient stable for discharge. ? ?Christine Rocha ? ?  ?Elnora Morrison, MD ?04/13/21 1708 ? ?

## 2021-04-13 NOTE — Progress Notes (Addendum)
Inpatient Diabetes Program Recommendations ? ?AACE/ADA: New Consensus Statement on Inpatient Glycemic Control  ? ?Target Ranges:  Prepandial:   less than 140 mg/dL ?     Peak postprandial:   less than 180 mg/dL (1-2 hours) ?     Critically ill patients:  140 - 180 mg/dL  ? ? Latest Reference Range & Units 04/13/21 09:24  ?Glucose-Capillary 70 - 99 mg/dL 558 (HH)  ? ? Latest Reference Range & Units 02/09/21 09:16  ?HbA1c, POC (controlled diabetic range) 0.0 - 7.0 % 7.3 !  ? ? Latest Reference Range & Units 05/06/20 08:36 09/29/20 15:44  ?Hemoglobin A1C 4.0 - 5.6 % 9.4 (H) 12.8 !  ? ?Review of Glycemic Control ? ?Diabetes history: DM2 ?Outpatient Diabetes medications: Trulicity 5.88 mg Qweek (none in 2 weeks) ?Current orders for Inpatient glycemic control: None; in ED ? ?Inpatient Diabetes Program Recommendations:   ? ?Insulin: BMET results still pending. If patient is acidotic, recommend ordering IV insulin per DKA protocol.  If patient is not acidotic on labs, may want to consider ordering Semglee 10 units Q24H, CBGs Q4H, Novolog 0-9 units Q4H. ? ?NOTE: Noted consult for diabetes coordinator. Per chart, patient goes to [facility] for primary care. Patient was seen on 01/14/21 by S. Daisy Blossom, RPh, CPP for diabetes evaluation, education, and management. Per note on 01/14/21, patient had been prescribed Lantus 10 units daily, Metformin 5027 mg BID, and Trulicity 7.41 mg Qweek for DM control.  Per office note on 01/14/21, "having issues with administering her insulin. She tried putting needles on multiple times but was unable to get the insulin out. She brought them in today and I was able to do this just fine. She thinks this is also due to her "heavy hand." She has not been taking metformin because she isn't eating enough to have food on her stomach with it. She just got Trulicity in the mail on Friday (has two boxes) and she gave her first injection yesterday without issue. She is still  having food insecurity and eating about 1 meal per day.  She just started Trulicity yesterday which is the first time she has taken any DM medications in a month. Since BG at goal today (likely due to not eating much lately), will continue current medications which is Trulicity only given concern for hypoglycemia due to food insecurity with resuming insulin." Patient seen RD at [facility] today and patient reported fastin glucose 458 mg/dl this morning and glucose over 600 mg/dl in office today. Per note, patient reported she ran out of Trulicity 2 weeks ago. Will plan to speak with patient today. ? ?Addendum 04/13/21'@12'$ :00-Spoke to patient at bedside regarding DM control. Patient reports that she was last taking Trulicity 2.87 mg Qweek but she ran out of medication 2 weeks ago. Patient reports that she has taken Lantus and Metformin but it was stopped by her PCP and she was told to just take the Trulicity for DM control. Patient was checking glucose 2 times per day and has a glucose log. Reviewe\d gluicose log and glucose was trending in low to mid 100's mg/dl when she was on the Trulicity. Once she ran out of Trulicity, glucose increased into 300-600 mg/dl. Patient reports that she still has Lantus at home but she has not taken it in a long time. Patient states that she is not sure if she has any refills f\or Trulicity at her pharmacy or not. Patient has not let her  PCP know that she ran out of Trulicity. Patient normally gets [facility] from [facility]. Patient states she would like to resume the Trulicity. Discussed glucose and A1C goals. Encouraged patient to stop drinking regular sodas and drink diet soda or water to help improve DM control. Patient states she has a [facility] with [facility] on [facility]th but does not have a follow up with [facility] scheduled. Asked that she call and make follow up visit with [facility] regarding DM control. Explained that she needs to  consistently take Trulicity every week as perscribed and once she gets down to one pen of the Trulicity she needs to call and get refilled. Patient verbalized understanding of information and states she has no questions at this time. [place] and informed that patient was prescribed Trulicity on 06/16/28 but the prescription was never picked up. Asked that the Trulicity Rx be filled and will let patient know that it will be ready at [facility] for her to pick up after discharge from the hospital. Discussed with Raquel Sarna, RN.  IF glucose will be  treated and plan will be to discharge patient from ED, would give Novolog to get glucose down and would also give one time dose of Semglee 10 units and have patient resume Trulicity once picked up from pharmacy today. Informed patient that [facility] pharmacy has Rx for Trulicity that was prescribed 02/27/21 and they report it was never picked up. Patient states she was not aware that [facility] had Rx for Trulicity but she will plan to go pick it up after being discharged from the hospital. Again stressed to patient that she needs to take the medication as prescribed and she needs to call in refill to pharmacy if she is down to one Trulicity pen and reach out to PCP if she has any issues with getting medication filled.  ?  ?Thanks, ?Barnie Alderman, RN, MSN, CDE ?Diabetes Coordinator ?Inpatient Diabetes Program ?[phone] (Team Pager from 8am to 5pm) ? ? ? ? ?

## 2021-04-13 NOTE — Patient Instructions (Signed)
Call your pharmacy about the Trulicity.  Please take your medications as prescribed. ? ?Boil your meat rather than fry. ?Reduce the amount of butter and oil to very small amounts. ?Eat more vegetables. ?Small portions of fruit. ?Avoid sweetened drinks. ?            Choose low fat milk  ?            Drink more water ?         ?Balance meals.  Aim for small amounts protein with each meal and snack. ?Half your plate should be non starchy vegetables ?A Quarter of the plate should be protein ?A Quarter of the plate can be a starch. ?You can have fruit or low fat milk with your meals if you desire. ?

## 2021-04-13 NOTE — ED Notes (Signed)
Reviewed discharge instructions with patient. Follow-up care and medications reviewed. Patient  verbalized understanding. Patient A&Ox4, VSS, and ambulatory with steady gait upon discharge.  °

## 2021-04-13 NOTE — ED Notes (Signed)
Diabetes coordinator at bedside. 

## 2021-04-13 NOTE — ED Provider Notes (Signed)
?[facility] ?Provider Note ? ? ?CSN: [id number] ?Arrival date & time: 04/13/21  0855 ? ?  ? ?History ? ?Chief Complaint  ?Patient presents with  ? Hyperglycemia  ? ? ?Christine Rocha is a 66 y.o. female. ? ? ?Hyperglycemia ?Associated symptoms: fatigue   ?Patient presents with hyperglycemia.  Went to off this morning found to have sugars above 600.  Has been off her Trulicity for at least a couple weeks now.  Has a log of her blood sugars and is running high pretty much since she stopped the Trulicity.  States she does have not been able to get it filled.  Dizziness and feeling bad.  No fevers or chills.  Has had urinary frequency.  Had previously been on insulin.  Had previously been on metformin and not tolerated. ?  ? ?Home Medications ?Prior to Admission medications   ?Medication Sig Start Date End Date Taking? Authorizing Provider  ?amLODipine (NORVASC) 10 MG tablet Take 1 tablet (10 mg total) by mouth daily. To lower blood pressure 02/09/21   Charlott Rakes, MD  ?atenolol (TENORMIN) 100 MG tablet Take 1 tablet (100 mg total) by mouth daily. 02/09/21   Charlott Rakes, MD  ?Blood Glucose Monitoring Suppl (CONTOUR NEXT EZ) w/Device KIT Use to check blood sugar once daily. ?Patient not taking: Reported on 02/27/2021 01/14/21   Charlott Rakes, MD  ?buPROPion [facility] SR) 150 MG 12 hr tablet Take 1 tablet (150 mg total) by mouth 2 (two) times daily. To stop smoking 02/27/21   Gildardo Pounds, NP  ?ciclopirox (PENLAC) 8 % solution APPLY ONE COAT TO AFFECTED TONAILS EVERY DAY. REMOVE WEEKLY WITH POLISH REMOVER ?Patient not taking: Reported on 02/27/2021 12/24/20   Marzetta Board, DPM  ?CONTOUR NEXT TEST test strip USE TO CHECK BLOOD SUGAR ONCE DAILY 04/13/21   Gildardo Pounds, NP  ?Dulaglutide (TRULICITY) 2.84 XL/2.4MW SOPN Inject 0.75 mg into the skin once a week. ?Patient not taking: Reported on 04/13/2021 02/27/21   Gildardo Pounds, NP  ?lisinopril (ZESTRIL) 10 MG tablet Take 1  tablet (10 mg total) by mouth daily. 02/09/21   Charlott Rakes, MD  ?Microlet Lancets MISC Use to check blood sugar once daily. ?Patient not taking: Reported on 02/27/2021 02/09/21   Charlott Rakes, MD  ?Multiple Vitamins-Minerals (WOMENS 50+ MULTI VITAMIN/MIN) TABS Take 1 tablet by mouth daily. ?Patient not taking: Reported on 02/27/2021    [provider]  ?polyethylene glycol (MIRALAX / GLYCOLAX) 17 g packet Take 17 g by mouth daily as needed. Mixed into 16 or more ounces of fluid ?Patient not taking: Reported on 02/27/2021 06/20/20   Gildardo Pounds, NP  ?rosuvastatin (CRESTOR) 10 MG tablet Take 1 tablet (10 mg total) by mouth daily. To lower cholesterol 02/09/21   Charlott Rakes, MD  ?   ? ?Allergies    ?Penicillins   ? ?Review of Systems   ?Review of Systems  ?Constitutional:  Positive for fatigue.  ? ?Physical Exam ?Updated Vital Signs ?BP 129/66   Pulse 71   Temp 98.6 ?F (37 ?C)   Resp 16   Ht 5' 3" (1.6 m)   Wt 86.2 kg   SpO2 95%   BMI 33.66 kg/m?  ?Physical Exam ?Vitals and nursing note reviewed.  ?HENT:  ?   Head: Normocephalic.  ?Cardiovascular:  ?   Rate and Rhythm: Regular rhythm.  ?Pulmonary:  ?   Effort: Pulmonary effort is normal.  ?Abdominal:  ?   Tenderness: There  is no abdominal tenderness.  ?Musculoskeletal:     ?   General: No tenderness.  ?Skin: ?   General: Skin is warm.  ?Neurological:  ?   Mental Status: She is alert.  ? ? ?ED Results / Procedures / Treatments   ?Labs ?(all labs ordered are listed, but only abnormal results are displayed) ?Labs Reviewed  ?BASIC METABOLIC PANEL - Abnormal; Notable for the following components:  ?    Result Value  ? Sodium 133 (*)   ? Glucose, Bld 572 (*)   ? BUN 7 (*)   ? All other components within normal limits  ?URINALYSIS, ROUTINE W REFLEX MICROSCOPIC - Abnormal; Notable for the following components:  ? Color, Urine STRAW (*)   ? Specific Gravity, Urine 1.033 (*)   ? Glucose, UA >=500 (*)   ? Hgb urine dipstick SMALL (*)   ? Ketones, ur 5  (*)   ? Leukocytes,Ua MODERATE (*)   ? Bacteria, UA RARE (*)   ? All other components within normal limits  ?CBG MONITORING, ED - Abnormal; Notable for the following components:  ? Glucose-Capillary 558 (*)   ? All other components within normal limits  ?CBG MONITORING, ED - Abnormal; Notable for the following components:  ? Glucose-Capillary 428 (*)   ? All other components within normal limits  ?CBG MONITORING, ED - Abnormal; Notable for the following components:  ? Glucose-Capillary 308 (*)   ? All other components within normal limits  ?CBC  ?CBG MONITORING, ED  ?CBG MONITORING, ED  ? ? ?EKG ?None ? ?Radiology ?No results found. ? ?Procedures ?Procedures  ? ? ?Medications Ordered in ED ?Medications  ?insulin aspart (novoLOG) injection 8 Units (has no administration in time range)  ?sodium chloride 0.9 % bolus 1,000 mL (0 mLs Intravenous Stopped 04/13/21 1221)  ?insulin aspart (novoLOG) injection 8 Units (8 Units Intravenous Given 04/13/21 1221)  ?insulin glargine-yfgn (SEMGLEE) injection 10 Units (10 Units Subcutaneous Given 04/13/21 1302)  ? ? ?ED Course/ Medical Decision Making/ A&P ?  ?                        ?Medical Decision Making ?Amount and/or Complexity of Data Reviewed ?Labs: ordered. ? ?Risk ?Prescription drug management. ? ?Critical Care ?Total time providing critical care: 30 minutes ? ?Patient bends with hyperglycemia.  History of same.  Has been noncompliant with her Trulicity.  Does not appear to be in a DKA at this time.  Does not appear to have infection.  Seen by diabetes coordinator.  Recommended starting back on the Trulicity.  Also recommend giving insulin here and then also dose of Semglee sugars have been gradually coming down with the insulin and some fluid.  However would like to have lower than 300 which was most recently.  After this hopefully should be able to be discharged home with outpatient follow-up.  Care turned over to Dr. Zammit ?I reviewed the patient's PCP and nutrition notes.   Reviewed consult from diabetes coordinator ? ? ? ? ? ? ? ?Final Clinical Impression(s) / ED Diagnoses ?Final diagnoses:  ?Hyperglycemia  ? ? ?Rx / DC Orders ?ED Discharge Orders   ? ? None  ? ?  ? ? ?  ?Pickering, Nathan, MD ?04/13/21 1531 ? ?

## 2021-04-13 NOTE — Telephone Encounter (Signed)
Requested Prescriptions  ?Pending Prescriptions Disp Refills  ?? CONTOUR NEXT TEST test strip [Pharmacy Med Name: CONTOUR NEXT TEST STRIPS 100S] 100 strip 0  ?  Sig: USE TO CHECK BLOOD SUGAR ONCE DAILY  ?  ? Endocrinology: Diabetes - Testing Supplies Passed - 04/11/2021  9:31 AM  ?  ?  Passed - Valid encounter within last 12 months  ?  Recent Outpatient Visits   ?      ? 1 month ago Essential hypertension  ? [place], [place] W, NP  ? 2 months ago Type 2 diabetes mellitus with hyperglycemia, without long-term current use of insulin ([place])  ? [place], RPH-CPP  ? 2 months ago Type 2 diabetes mellitus with hyperglycemia, without long-term current use of insulin ([place])  ? [place], RPH-CPP  ? 3 months ago Essential hypertension  ? [place], RPH-CPP  ? 4 months ago Type 2 diabetes mellitus with hyperglycemia, without long-term current use of insulin ([place])  ? [place], RPH-CPP  ?  ?  ? ?  ?  ?  ? ? ?

## 2021-04-13 NOTE — Discharge Instructions (Addendum)
Start taking the Trulicity again.  Follow-up with your doctor. ?

## 2021-04-22 ENCOUNTER — Encounter: Payer: Self-pay | Admitting: Nurse Practitioner

## 2021-04-22 ENCOUNTER — Ambulatory Visit: Payer: Medicare Other | Attending: Nurse Practitioner | Admitting: Nurse Practitioner

## 2021-04-22 VITALS — BP 142/79 | HR 86 | Wt 189.2 lb

## 2021-04-22 DIAGNOSIS — K644 Residual hemorrhoidal skin tags: Secondary | ICD-10-CM

## 2021-04-22 DIAGNOSIS — F17200 Nicotine dependence, unspecified, uncomplicated: Secondary | ICD-10-CM | POA: Diagnosis not present

## 2021-04-22 DIAGNOSIS — E1165 Type 2 diabetes mellitus with hyperglycemia: Secondary | ICD-10-CM

## 2021-04-22 DIAGNOSIS — K5900 Constipation, unspecified: Secondary | ICD-10-CM | POA: Diagnosis not present

## 2021-04-22 DIAGNOSIS — E871 Hypo-osmolality and hyponatremia: Secondary | ICD-10-CM | POA: Diagnosis not present

## 2021-04-22 DIAGNOSIS — I10 Essential (primary) hypertension: Secondary | ICD-10-CM | POA: Diagnosis not present

## 2021-04-22 DIAGNOSIS — B3789 Other sites of candidiasis: Secondary | ICD-10-CM

## 2021-04-22 MED ORDER — BUPROPION HCL ER (SR) 150 MG PO TB12: 150.0000 mg | ORAL_TABLET | Freq: Two times a day (BID) | ORAL | 0 refills | Status: DC

## 2021-04-22 MED ORDER — HYDROCORTISONE (PERIANAL) 2.5 % EX CREA: 1.0000 "application " | TOPICAL_CREAM | Freq: Two times a day (BID) | CUTANEOUS | 1 refills | Status: DC

## 2021-04-22 MED ORDER — TRULICITY 0.75 MG/0.5ML ~~LOC~~ SOAJ: 0.7500 mg | SUBCUTANEOUS | 6 refills | Status: DC

## 2021-04-22 MED ORDER — POLYETHYLENE GLYCOL 3350 17 G PO PACK: 17.0000 g | PACK | Freq: Every day | ORAL | 6 refills | Status: DC | PRN

## 2021-04-22 MED ORDER — NYSTATIN-TRIAMCINOLONE 100000-0.1 UNIT/GM-% EX OINT: 1.0000 "application " | TOPICAL_OINTMENT | Freq: Two times a day (BID) | CUTANEOUS | 0 refills | Status: AC

## 2021-04-22 MED ORDER — LISINOPRIL 20 MG PO TABS: 20.0000 mg | ORAL_TABLET | Freq: Every day | ORAL | 0 refills | Status: DC

## 2021-04-22 NOTE — Progress Notes (Signed)
? ?Assessment & Plan:  ?Christine Rocha was seen today for hospitalization follow-up and hypertension. ? ?Diagnoses and all orders for this visit: ? ?Essential hypertension ?-     lisinopril (ZESTRIL) 20 MG tablet; Take 1 tablet (20 mg total) by mouth daily. ?-     Basic metabolic panel; Future ?Bring blood pressure log to next appt ? ?Type 2 diabetes mellitus with hyperglycemia, without long-term current use of insulin ([place]) ?-     Cancel: POCT glucose (manual entry) ?-     Dulaglutide (TRULICITY) 8.14 GY/1.8HU SOPN; Inject 0.75 mg into the skin once a week. ?-     CMP14+EGFR ?-     Hemoglobin A1c; Future ? ?Constipation, unspecified constipation type ?-     polyethylene glycol (MIRALAX / GLYCOLAX) 17 g packet; Take 17 g by mouth daily as needed. Mixed into 16 or more ounces of fluid ? ?Tobacco dependence ?-     buPROPion (WELLBUTRIN SR) 150 MG 12 hr tablet; Take 1 tablet (150 mg total) by mouth 2 (two) times daily. To stop smoking ? ?Hyponatremia ?-     CMP14+EGFR ? ?External hemorrhoid ?-     hydrocortisone (ANUSOL-HC) 2.5 % rectal cream; Place 1 application. rectally 2 (two) times daily. ? ?Candidiasis of breast ?-     nystatin-triamcinolone ointment (MYCOLOG); Apply 1 application. topically 2 (two) times daily. As needed under breasts and under stomach ? ? ? ?Patient has been counseled on age-appropriate routine health concerns for screening and prevention. These are reviewed and up-to-date. Referrals have been placed accordingly. Immunizations are up-to-date or declined.    ?Subjective:  ? ?Chief Complaint  ?Patient presents with  ? Hospitalization Follow-up  ? Hypertension  ? ?HPI ?Christine Rocha 66 y.o. female presents to office today for HTN. ? has a past medical history of Arthritis, Diabetes mellitus without complication ([place]), Hypertension, and Seizures ([place]).  ? ?She was recently admitted on 04-13-2021 for hyperglycemia. She had not been taking her trulicity and did not call the pharmacy to pick up the trulicity  refills that were there.  ? ?Still smoking. Now down to 3 cigarettes per day as opposed to previously smoking 1ppd.  Will continue wellbutrin.  ? ?HTN ?Blood pressure is elevated today. She endorses adhernece taking atenolol 100 mg daily, amlodipine 10 mg daily, and lisinopril 10 mg daily. Will increase lisinopril to 20 mg daily.  ?BP Readings from Last 3 Encounters:  ?04/22/21 (!) 142/79  ?04/13/21 (!) 147/82  ?02/27/21 (!) 147/81  ?  ? Hemorrhoids: Patient complains of evaluation of possible hemorrhoids. Onset of symptoms was several weeks ago with unchanged course since that time.  She describes symptoms as anorectal itching, constipation, pain with sitting, painful defecation, painful perianal swelling, and perianal tags. Treatment to date has been none. Patient denies family hx of colorectal CA, known or suspected STD exposure, maroon colored stools, melena, and weight loss.  ? ? ?Rash: Patient complains of rash under the bilateral breasts and pannus.  Rash is chronic and re occurring.   Discomfort associated with rash: is pruritic and and irritating to skin .  Patient has had previous evaluation of rash. Patient has had previous treatment.  Response to treatment: good. Patient has not had new exposures (soaps, lotions, laundry detergents, foods, medications, plants, insects or animals.)  ? ? ? ?Review of Systems  ?Constitutional:  Negative for fever, malaise/fatigue and weight loss.  ?HENT: Negative.  Negative for nosebleeds.   ?Eyes: Negative.  Negative for blurred vision, double vision and  photophobia.  ?Respiratory: Negative.  Negative for cough and shortness of breath.   ?Cardiovascular: Negative.  Negative for chest pain, palpitations and leg swelling.  ?Gastrointestinal:  Positive for constipation. Negative for abdominal pain, blood in stool, diarrhea, heartburn, melena, nausea and vomiting.  ?Musculoskeletal: Negative.  Negative for myalgias.  ?Skin:  Positive for itching and rash.  ?Neurological:  Negative.  Negative for dizziness, focal weakness, seizures and headaches.  ?Psychiatric/Behavioral: Negative.  Negative for suicidal ideas.   ? ?Past Medical History:  ?Diagnosis Date  ? Arthritis   ? Right Knee  ? Diabetes mellitus without complication ([place])   ? Hypertension   ? Seizures ([place])   ? ? ?Past Surgical History:  ?Procedure Laterality Date  ? COLONOSCOPY  2021  ? ORIF FEMUR FRACTURE Left 04/24/2017  ? Procedure: OPEN REDUCTION INTERNAL FIXATION (ORIF) DISTAL FEMUR FRACTURE;  Surgeon: [name], MD;  Location: [facility];  Service: Orthopedics;  Laterality: Left;  ? ? ?Family History  ?Problem Relation Age of Onset  ? Hypertension Mother   ? Breast cancer Mother   ? Hypertension Sister   ? Diabetes Sister   ? ? ?Social History Reviewed with no changes to be made today.  ? ?Outpatient Medications Prior to Visit  ?Medication Sig Dispense Refill  ? amLODipine (NORVASC) 10 MG tablet Take 1 tablet (10 mg total) by mouth daily. To lower blood pressure 90 tablet 1  ? atenolol (TENORMIN) 100 MG tablet Take 1 tablet (100 mg total) by mouth daily. 90 tablet 1  ? Blood Glucose Monitoring Suppl (CONTOUR NEXT EZ) w/Device KIT Use to check blood sugar once daily. 1 kit 0  ? ciclopirox (PENLAC) 8 % solution APPLY ONE COAT TO AFFECTED TONAILS EVERY DAY. REMOVE WEEKLY WITH POLISH REMOVER 6.6 mL 11  ? CONTOUR NEXT TEST test strip USE TO CHECK BLOOD SUGAR ONCE DAILY 100 strip 0  ? Microlet Lancets MISC Use to check blood sugar once daily. 100 each 0  ? Multiple Vitamins-Minerals (WOMENS 50+ MULTI VITAMIN/MIN) TABS Take 1 tablet by mouth daily.    ? rosuvastatin (CRESTOR) 10 MG tablet Take 1 tablet (10 mg total) by mouth daily. To lower cholesterol 90 tablet 3  ? buPROPion (WELLBUTRIN SR) 150 MG 12 hr tablet Take 1 tablet (150 mg total) by mouth 2 (two) times daily. To stop smoking 180 tablet 0  ? Dulaglutide (TRULICITY) 2.99 BZ/1.6RC SOPN Inject 0.75 mg into the skin once a week. 2 mL 2  ? lisinopril (ZESTRIL) 10 MG tablet  Take 1 tablet (10 mg total) by mouth daily. 90 tablet 3  ? polyethylene glycol (MIRALAX / GLYCOLAX) 17 g packet Take 17 g by mouth daily as needed. Mixed into 16 or more ounces of fluid 30 each 6  ? ?No facility-administered medications prior to visit.  ? ? ?Allergies  ?Allergen Reactions  ? Penicillins Nausea Only  ? ? ?   ?Objective:  ?  ?BP (!) 142/79   Pulse 86   Wt 189 lb 3.2 oz (85.8 kg)   SpO2 96%   BMI 33.52 kg/m?  ?Wt Readings from Last 3 Encounters:  ?04/22/21 189 lb 3.2 oz (85.8 kg)  ?04/13/21 190 lb (86.2 kg)  ?04/13/21 190 lb (86.2 kg)  ? ? ?Physical Exam ?Vitals and nursing note reviewed.  ?Constitutional:   ?   Appearance: She is well-developed.  ?HENT:  ?   Head: Normocephalic and atraumatic.  ?Cardiovascular:  ?   Rate and Rhythm: Normal rate and regular rhythm.  ?  Heart sounds: Normal heart sounds. No murmur heard. ?  No friction rub. No gallop.  ?Pulmonary:  ?   Effort: Pulmonary effort is normal. No tachypnea or respiratory distress.  ?   Breath sounds: Normal breath sounds. No decreased breath sounds, wheezing, rhonchi or rales.  ?Chest:  ?   Chest wall: No tenderness.  ?Abdominal:  ?   General: Bowel sounds are normal.  ?   Palpations: Abdomen is soft.  ?Musculoskeletal:     ?   General: Normal range of motion.  ?   Cervical back: Normal range of motion.  ?Skin: ?   General: Skin is warm and dry.  ?   Findings: Rash present. Rash is macular.  ? ?    ?Neurological:  ?   Mental Status: She is alert and oriented to person, place, and time.  ?   Coordination: Coordination normal.  ?Psychiatric:     ?   Behavior: Behavior normal. Behavior is cooperative.     ?   Thought Content: Thought content normal.     ?   Judgment: Judgment normal.  ? ? ? ? ?   ?Patient has been counseled extensively about nutrition and exercise as well as the importance of adherence with medications and regular follow-up. The patient was given clear instructions to go to ER or return to medical center if symptoms don't  improve, worsen or new problems develop. The patient verbalized understanding.  ? ?Follow-up: Return for  Lab visit in 3 weeks needs appt. .  ? ?Gildardo Pounds, FNP-BC ?[facility]

## 2021-04-23 LAB — CMP14+EGFR
ALT: 35 IU/L — ABNORMAL HIGH (ref 0–32)
AST: 25 IU/L (ref 0–40)
Albumin/Globulin Ratio: 1.9 (ref 1.2–2.2)
Albumin: 4.5 g/dL (ref 3.8–4.8)
Alkaline Phosphatase: 102 IU/L (ref 44–121)
BUN/Creatinine Ratio: 7 — ABNORMAL LOW (ref 12–28)
BUN: 7 mg/dL — ABNORMAL LOW (ref 8–27)
Bilirubin Total: 0.3 mg/dL (ref 0.0–1.2)
CO2: 24 mmol/L (ref 20–29)
Calcium: 10.1 mg/dL (ref 8.7–10.3)
Chloride: 105 mmol/L (ref 96–106)
Creatinine, Ser: 0.96 mg/dL (ref 0.57–1.00)
Globulin, Total: 2.4 g/dL (ref 1.5–4.5)
Glucose: 143 mg/dL — ABNORMAL HIGH (ref 70–99)
Potassium: 4.3 mmol/L (ref 3.5–5.2)
Sodium: 143 mmol/L (ref 134–144)
Total Protein: 6.9 g/dL (ref 6.0–8.5)
eGFR: 66 mL/min/{1.73_m2} (ref >59)

## 2021-05-07 ENCOUNTER — Other Ambulatory Visit: Payer: Self-pay | Admitting: Nurse Practitioner

## 2021-05-07 DIAGNOSIS — I10 Essential (primary) hypertension: Secondary | ICD-10-CM

## 2021-05-08 DIAGNOSIS — E118 Type 2 diabetes mellitus with unspecified complications: Secondary | ICD-10-CM | POA: Diagnosis not present

## 2021-05-13 ENCOUNTER — Ambulatory Visit: Payer: Medicare Other | Attending: Nurse Practitioner

## 2021-05-13 DIAGNOSIS — I10 Essential (primary) hypertension: Secondary | ICD-10-CM | POA: Diagnosis not present

## 2021-05-13 DIAGNOSIS — E1165 Type 2 diabetes mellitus with hyperglycemia: Secondary | ICD-10-CM | POA: Diagnosis not present

## 2021-05-14 LAB — HEMOGLOBIN A1C
Est. average glucose Bld gHb Est-mCnc: 275 mg/dL
Hgb A1c MFr Bld: 11.2 % — ABNORMAL HIGH (ref 4.8–5.6)

## 2021-05-14 LAB — BASIC METABOLIC PANEL
BUN/Creatinine Ratio: 8 — ABNORMAL LOW (ref 12–28)
BUN: 8 mg/dL (ref 8–27)
CO2: 24 mmol/L (ref 20–29)
Calcium: 9.9 mg/dL (ref 8.7–10.3)
Chloride: 102 mmol/L (ref 96–106)
Creatinine, Ser: 1 mg/dL (ref 0.57–1.00)
Glucose: 85 mg/dL (ref 70–99)
Potassium: 4.4 mmol/L (ref 3.5–5.2)
Sodium: 140 mmol/L (ref 134–144)
eGFR: 63 mL/min/{1.73_m2} (ref >59)

## 2021-05-15 ENCOUNTER — Ambulatory Visit (INDEPENDENT_AMBULATORY_CARE_PROVIDER_SITE_OTHER): Payer: Medicare Other | Admitting: Podiatry

## 2021-05-15 DIAGNOSIS — M79675 Pain in left toe(s): Secondary | ICD-10-CM | POA: Diagnosis not present

## 2021-05-15 DIAGNOSIS — L84 Corns and callosities: Secondary | ICD-10-CM | POA: Diagnosis not present

## 2021-05-15 DIAGNOSIS — M79674 Pain in right toe(s): Secondary | ICD-10-CM

## 2021-05-15 DIAGNOSIS — B351 Tinea unguium: Secondary | ICD-10-CM

## 2021-05-15 DIAGNOSIS — E1142 Type 2 diabetes mellitus with diabetic polyneuropathy: Secondary | ICD-10-CM | POA: Diagnosis not present

## 2021-05-18 ENCOUNTER — Ambulatory Visit: Payer: Medicare Other | Admitting: Dietician

## 2021-05-25 ENCOUNTER — Encounter: Payer: Self-pay | Admitting: Podiatry

## 2021-05-25 NOTE — Progress Notes (Signed)
?  Subjective:  ?Patient ID: Christine Rocha, female    DOB: 1955/08/23,  MRN: [id number] ? ?Christine Rocha presents to clinic today for at risk foot care with history of diabetic neuropathy and callus(es) b/l lower extremities and painful thick toenails that are difficult to trim. Painful toenails interfere with ambulation. Aggravating factors include wearing enclosed shoe gear. Pain is relieved with periodic professional debridement. Painful calluses are aggravated when weightbearing with and without shoegear. Pain is relieved with periodic professional debridement. ? ?Patient states blood glucose was 103 mg/dl today.   ? ?She is using Vaseline on feet daily. ? ?Last known HgA1c was unknown. ? ?New problem(s): None.  ? ?PCP is Gildardo Pounds, NP , and last visit was April 22, 2021. ? ?Allergies  ?Allergen Reactions  ? Penicillins Nausea Only  ? ? ?Review of Systems: Negative except as noted in the HPI. ? ?Objective: No changes noted in today's physical examination. ?There were no vitals filed for this visit. ? ?Christine Rocha is a pleasant 66 y.o. female in NAD. AAO X 3. ? ?Vascular Examination: ?CFT immediate b/l LE. Palpable DP/PT pulses b/l LE. Digital hair sparse b/l. Skin temperature gradient WNL b/l. No pain with calf compression b/l. No edema noted b/l. No cyanosis or clubbing noted b/l LE. ? ?Dermatological Examination: ?Pedal integument with normal turgor, texture and tone BLE. No open wounds b/l LE. No interdigital macerations noted b/l LE. Toenails 1-5 b/l elongated, discolored, dystrophic, thickened, crumbly with subungual debris and tenderness to dorsal palpation. Hyperkeratotic lesion(s) bilateral heels and bilateral great toes.  No erythema, no edema, no drainage, no fluctuance. ? ?Musculoskeletal Examination: ?Muscle strength 5/5 to all lower extremity muscle groups bilaterally. No pain, crepitus or joint limitation noted with ROM bilateral LE. No gross bony deformities bilaterally. ? ?Neurological  Examination: ?Protective sensation decreased with 10 gram monofilament b/l. Vibratory sensation intact b/l. ? ? ?  Latest Ref Rng & Units 05/13/2021  ?  8:40 AM 02/09/2021  ?  9:16 AM 09/29/2020  ?  3:44 PM  ?Hemoglobin A1C  ?Hemoglobin-A1c 4.8 - 5.6 % 11.2   7.3   12.8    ? ?Assessment/Plan: ?1. Pain due to onychomycosis of toenail   ?2. Callus   ?3. Diabetic peripheral neuropathy associated with type 2 diabetes mellitus ([place])   ?  ? ?-Patient was evaluated and treated. All patient's and/or POA's questions/concerns answered on today's visit. ?-Pt to continue use of ciclopirox solution on toenails daily. Continue Vaseline on feet daily. ?-Mycotic toenails 1-5 bilaterally were debrided in length and girth with sterile nail nippers and dremel without incident. ?-Callus(es) bilateral great toes pared utilizing sterile scalpel blade without complication or incident. Total number debrided =2. ?-Patient/POA to call should there be question/concern in the interim.  ? ?Return in about 3 months (around 08/15/2021). ? ?Marzetta Board, DPM  ?

## 2021-05-28 ENCOUNTER — Ambulatory Visit: Payer: Medicare Other | Admitting: Dietician

## 2021-06-02 ENCOUNTER — Telehealth: Payer: Self-pay

## 2021-06-02 NOTE — Telephone Encounter (Signed)
Contacted pt to schedule Medicare Wellness pt didn't answer lvm   °

## 2021-06-04 ENCOUNTER — Encounter: Payer: Self-pay | Admitting: Skilled Nursing Facility1

## 2021-06-04 ENCOUNTER — Encounter: Payer: Medicare Other | Attending: Nurse Practitioner | Admitting: Skilled Nursing Facility1

## 2021-06-04 DIAGNOSIS — E1165 Type 2 diabetes mellitus with hyperglycemia: Secondary | ICD-10-CM | POA: Insufficient documentation

## 2021-06-04 NOTE — Progress Notes (Signed)
  Diabetes Self-Management Education   06/04/2021  [name], identified by name and date of birth, is a 66 y.o. female with a diagnosis of Diabetes:  .   ASSESSMENT   Patient is here today alone.  She was last seen by this RD on 06/04/2021.  Pt arrives with leg pain stating [place]. Pt states she is going to go to the stop closer to not have to walk all that way again. Pt states she has been keeping up with her blood pressure. Pt arrives with a written logs of her numbers and her medicines in hand.  Pt states she has been having trouble with back pain.   Pt states most of the time she does not have food at home due to affordability. Pt states her and her family do not get along so she goes to church and the [employer] for food.  Pt staets she goes without dinner a couple days a week due to affordability: Pt given the shopping at [employer] handout   Pt states she no longer has a CGM due to an inability to use it. Pt staets she walks 2 hours avery morning with her dog.   Pt states she has made a lot of changes: dietitian states well now we maintian them and pt agreed. She has been keeping records of her blood glucose and blood pressure.  Praised her for this. Fasting numbers listed: 115, 153, 99, 128, 200, 104, 143, 150, 140, 118 Post prandial 2 hours: 103, 104, 136, 131, 120, 178, 127, 133, 97   Reduced cigarettes to 4-5 per day Walks the dog 2-4 hours daily  Medications: Amlodipine lisinopril Rosuvastatin Atenolol  Centrum silver Trulicity stating she does good with remembering to taking it every Monday  24 hr recall: wakes around 2am - 3:30am  First meal: 2 boiled egg and mayo and pepper sandwiches wheat bread  Snack: sometimes no sugar ice cream + milk Second meal: [place] sandwich and fruit Snack: Third meal: chicken + greens Snack: no sugar ice cream  Beverages: lactaid milk, zero sugar soda  Goals: Do not eat 2  sandwich at once: Have the second sandwich for dinner so you can space out your meals Do not wait longer than 5 hours without eating Be sure to eat something every day  History includes:  Type 2 Diabetes, HTN, arthritis Medications include:  Metformin (stopped), Lantus (stopped), Trulicity(ran out and not taking) Labs noted:  A1C 7.3% 02/09/2021 decreased from 12.8%, eGFR 65, Cholesterol 78, HDL 32, LDL 22, Triglycerides 134 09/29/2020   Weight:   190 lbs 04/13/2021 (lost with uncontrolled glucose) 197 lbs 02/17/21 189 lbs 11/28/2020   Patient lives with her husband.  They share the shopping and cooking. They rely on public transportation. She states that some vegetables (broccoli, cauliflower, and squash s) don't agree with her - make her want to vomit.  She enjoys cabbage and turnip greens.   She is on disability.  Walks with a cane or walker.   She smokes and has tried to quit without success.

## 2021-06-16 ENCOUNTER — Encounter: Payer: Self-pay | Admitting: Dietician

## 2021-06-16 ENCOUNTER — Encounter: Payer: Medicare Other | Attending: Nurse Practitioner | Admitting: Dietician

## 2021-06-16 DIAGNOSIS — E1165 Type 2 diabetes mellitus with hyperglycemia: Secondary | ICD-10-CM | POA: Diagnosis not present

## 2021-06-16 NOTE — Patient Instructions (Addendum)
Recommend 1% milk rather than whole or 2%. Continue to check your blood glucose Continue to take your medication Boil the neck bones, remove the meat, add vegetables and potatoes,rice, noodles or pasta to make a good soup.  Consider putting 4 cigarettes in a glass for the day, smoke only outside, and gradually lower the number of cigarettes you use daily.    Consider searching for "you tube eating on a budget"  Great job on the changes that you have made!

## 2021-06-16 NOTE — Progress Notes (Signed)
Diabetes Self-Management Education  Visit Type: Follow-up  Appt. Start Time: 0955 Appt. End Time: 1030  06/16/2021  [name], identified by name and date of birth, is a 66 y.o. female with a diagnosis of Diabetes:  .   ASSESSMENT Patient is here today alone.  She was last seen by a RD in the office on 06/04/2021.  Reviewed how to take the Trulicity using Demo pen. She did not bring her blood glucose or blood pressure logs today. Fasting blood glucose 138 yesterday am and glucose 135 in the evening. Blood pressure was normal based on patient report. Glucose was 106 in the office Smoking about 6 cigarettes per day. Walking her dog 2-4 hours per day. She reports consistently taking the Trulicity Sleep:  2 pm-7 pm and 11-4 pm yesterday  History includes:  Type 2 Diabetes, HTN, arthritis Medications include:  Metformin (stopped), Lantus (stopped), Trulicity(ran out and not taking) Labs noted:  A1C 11.2% 05/13/2021, 7.3% 02/09/2021, 12.8%, eGFR 65, Cholesterol 78, HDL 32, LDL 22, Triglycerides 134 09/29/2020   Weight:   182 lbs 06/16/2021 190 lbs 04/13/2021 (lost with uncontrolled glucose) 197 lbs 02/17/21 189 lbs 11/28/2020   Patient lives with her husband.  They share the shopping and cooking.  They eat out most of the time (fast food) but has changed to TV dinners most frequently since diabetes diagnosis. They rely on public transportation. She states that some vegetables (broccoli, cauliflower, and squash s) don't agree with her - make her want to vomit.  She enjoys cabbage and turnip greens.   She is on disability.  Walks with a cane or walker.   She smokes and has tried to quit without success. Food insecurity and relies on food banks to supplement.  Skips dinner a couple times a week due to money.  She was given the shopping at the [employer] handout on previous visit.  Weight 182 lb (82.6 kg). Body mass index is 32.24 kg/m.   Diabetes Self-Management Education - 06/16/21  1600       Visit Information   Visit Type Follow-up      Initial Visit   Are you currently following a meal plan? No    Are you taking your medications as prescribed? Yes      Psychosocial Assessment   Patient Belief/Attitude about Diabetes Motivated to manage diabetes    Self-care barriers Low literacy    Self-management support Friends;Family;CDE visits    Other persons present Patient    Special Needs Simplified materials    Preferred Learning Style Visual;Hands on      Pre-Education Assessment   Patient understands the diabetes disease and treatment process. Comprehends key points    Patient understands incorporating nutritional management into lifestyle. Comprehends key points    Patient undertands incorporating physical activity into lifestyle. Comprehends key points    Patient understands using medications safely. Comprehends key points    Patient understands monitoring blood glucose, interpreting and using results Comprehends key points    Patient understands prevention, detection, and treatment of acute complications. Comprehends key points    Patient understands prevention, detection, and treatment of chronic complications. Compreheands key points    Patient understands how to develop strategies to address psychosocial issues. Comprehends key points      Complications   Last HgB A1C per patient/outside source 11.2 %   06/09/2021   Fasting Blood glucose range (mg/dL) 70-129;130-179    Postprandial Blood glucose range (mg/dL) 130-179    Number of hypoglycemic  episodes per month 0      Dietary Intake   Breakfast 2 packs Oodles of noodles, applesauce    Snack (afternoon) watermelon or occasional sugar free ice cream    Dinner boiled chicken, greens    Beverage(s) water, zero sugar soda, lactaid milk      Activity / Exercise   Activity / Exercise Type Light (walking / raking leaves)    How many days per week do you exercise? 7    How many minutes per day do you  exercise? 120    Total minutes per week of exercise 840      Patient Education   Previous Diabetes Education Yes (please comment)   05/2021   Healthy Eating Meal options for control of blood glucose level and chronic complications.;Plate Method    Medications Reviewed patients medication for diabetes, action, purpose, timing of dose and side effects.;Taught/reviewed insulin/injectables, injection, site rotation, insulin/injectables storage and needle disposal.   trulicity education using demo pen   Diabetes Stress and Support Worked with patient to identify barriers to care and solutions      Individualized Goals (developed by patient)   Nutrition General guidelines for healthy choices and portions discussed    Physical Activity Exercise 5-7 days per week;60 minutes per day    Medications take my medication as prescribed    Monitoring  Test my blood glucose as discussed    Reducing Risk examine blood glucose patterns;stop smoking      Patient Self-Evaluation of Goals - Patient rates self as meeting previously set goals (% of time)   Nutrition >75% (most of the time)    Physical Activity >75% (most of the time)    Medications >75% (most of the time)    Monitoring >75% (most of the time)    Problem Solving and behavior change strategies  >75% (most of the time)    Reducing Risk (treating acute and chronic complications) >54% (most of the time)    Health Coping >75% (most of the time)      Post-Education Assessment   Patient understands the diabetes disease and treatment process. Demonstrates understanding / competency    Patient understands incorporating nutritional management into lifestyle. Comprehends key points    Patient undertands incorporating physical activity into lifestyle. Demonstrates understanding / competency    Patient understands using medications safely. Demonstrates understanding / competency    Patient understands monitoring blood glucose, interpreting and using results  Demonstrates understanding / competency    Patient understands prevention, detection, and treatment of acute complications. Demonstrates understanding / competency    Patient understands prevention, detection, and treatment of chronic complications. Demonstrates understanding / competency    Patient understands how to develop strategies to address psychosocial issues. Demonstrates understanding / competency    Patient understands how to develop strategies to promote health/change behavior. Demonstrates understanding / competency      Outcomes   Expected Outcomes Demonstrated interest in learning. Expect positive outcomes    Future DMSE 2 months    Program Status Not Completed      Subsequent Visit   Since your last visit have you continued or begun to take your medications as prescribed? Yes    Since your last visit have you experienced any weight changes? Loss    Weight Loss (lbs) 8             Individualized Plan for Diabetes Self-Management Training:   Learning Objective:  Patient will have a greater understanding of diabetes self-management. Patient  education plan is to attend individual and/or group sessions per assessed needs and concerns.   Plan:   Patient Instructions  Recommend 1% milk rather than whole or 2%. Continue to check your blood glucose Continue to take your medication Boil the neck bones, remove the meat, add vegetables and potatoes,rice, noodles or pasta to make a good soup.  Consider putting 4 cigarettes in a glass for the day, smoke only outside, and gradually lower the number of cigarettes you use daily.    Consider searching for "you tube eating on a budget"  Great job on the changes that you have made!    Expected Outcomes:  Demonstrated interest in learning. Expect positive outcomes  Education material provided: Food label handouts and My Plate  If problems or questions, patient to contact team via:  Phone  Future DSME appointment: 2 months

## 2021-06-26 ENCOUNTER — Other Ambulatory Visit: Payer: Self-pay

## 2021-06-26 ENCOUNTER — Encounter: Payer: Self-pay | Admitting: Pharmacist

## 2021-06-26 ENCOUNTER — Other Ambulatory Visit (HOSPITAL_COMMUNITY): Payer: Self-pay

## 2021-06-26 ENCOUNTER — Ambulatory Visit: Payer: Medicare Other | Attending: Nurse Practitioner | Admitting: Pharmacist

## 2021-06-26 DIAGNOSIS — E1165 Type 2 diabetes mellitus with hyperglycemia: Secondary | ICD-10-CM | POA: Diagnosis not present

## 2021-06-26 DIAGNOSIS — F17200 Nicotine dependence, unspecified, uncomplicated: Secondary | ICD-10-CM | POA: Diagnosis not present

## 2021-06-26 MED ORDER — NICOTINE POLACRILEX 4 MG MT GUM
4.0000 mg | CHEWING_GUM | OROMUCOSAL | 0 refills | Status: DC | PRN
  Filled 2021-06-26: qty 100, fill #0

## 2021-06-26 MED ORDER — TRULICITY 1.5 MG/0.5ML ~~LOC~~ SOAJ
1.5000 mg | SUBCUTANEOUS | 3 refills | Status: DC
  Filled 2021-06-26: qty 2, 28d supply, fill #0

## 2021-06-26 MED ORDER — CONTOUR NEXT TEST VI STRP: ORAL_STRIP | 3 refills | Status: DC

## 2021-06-26 MED ORDER — TRULICITY 1.5 MG/0.5ML ~~LOC~~ SOAJ: 1.5000 mg | SUBCUTANEOUS | 3 refills | Status: DC

## 2021-06-26 MED ORDER — CONTOUR NEXT TEST VI STRP
ORAL_STRIP | 3 refills | Status: DC
  Filled 2021-06-26: qty 100, 25d supply, fill #0
  Filled 2021-07-17: qty 100, 33d supply, fill #0
  Filled 2021-09-17: qty 100, 33d supply, fill #1
  Filled 2021-11-16: qty 100, 33d supply, fill #2

## 2021-06-26 MED ORDER — BUPROPION HCL ER (SR) 150 MG PO TB12
150.0000 mg | ORAL_TABLET | Freq: Two times a day (BID) | ORAL | 1 refills | Status: DC
  Filled 2021-06-26: qty 180, 90d supply, fill #0

## 2021-06-26 MED ORDER — NICOTINE 21 MG/24HR TD PT24
21.0000 mg | MEDICATED_PATCH | Freq: Every day | TRANSDERMAL | 1 refills | Status: DC
  Filled 2021-06-26: qty 28, 28d supply, fill #0

## 2021-06-26 NOTE — Progress Notes (Signed)
S:    PCP: Zelda   Patient presents for diabetes evaluation, education, and management. Patient was referred and last seen by Primary Care Provider on 04/22/2021. At that visit BP was 142/79 and lisinopril dose was increased. Last saw CPP 02/09/21 for DM where A1c had improved (12.8 >7.3). Since then, She has had elevated reading for BP at clinic visits and A1c increased to 11.2 in May 2023.  Ms. Malan arrived in good spirits with assistance from cane. She has been seeing dietitian and making great strides on lifestyle changes. She has made great efforts to check both BG and BP daily and presented with logs today. She confirmed adherence to all medications which no new concerns. Taken medications today before appointment. Denies blurry vision, dizziness, or hypoglycemia. Negative for orthostatics. However, she did relay barriers to affording medications at times and having access to bus for transportation to appointment and pharmacy.   Additionally, discussed smoking cessation efforts and patient admitted to increasing smoking recently, now 1.5 PPD. She has stopped taking wellbutrin, but willing to restart. She has used OTC nicotine replacement in the past with no success. She is very motivated to stop smoking, but would like some help with medications.  Current diabetes medications include:  -Trulicity 2.95 mg weekly on Monday (Denies GI upset)  Current hypertension medications include:  - amlodipine 10 mg daily - atenolol 100 mg daily - lisinopril 20 mg daily   Patient reported dietary habits: substituting sugar for sugar free items in diet (drinking diet soda instead of regular, sugar free ice creams) and eating more vegetables  Patient-reported exercise habits: walks her dog twice daily  Family/Social History:  Family Hx: hypertension (mother, sister); diabetes (sister) Social Hx: current smoker (1.5 PPD)  Insurance coverage/medication affordability: Copy. Receives Trulicity  through patient assistance.   O:  Home BP log (~month of data): 110-120 SBP, max 140s outliers Home BG log (~month of data): fasting 110s, max 142, nighttime 140s, lowest 88     06/26/2021   12:14 PM 04/22/2021   10:59 AM 04/13/2021    4:30 PM 04/13/2021    4:00 PM 04/13/2021    3:45 PM  Blood Pressure  BP 107/69 142/79 147/82 135/82 155/73   Lab Results  Component Value Date   HGBA1C 11.2 (H) 05/13/2021   Lipid Panel     Component Value Date/Time   CHOL 78 (L) 09/29/2020 1604   TRIG 134 09/29/2020 1604   HDL 32 (L) 09/29/2020 1604   CHOLHDL 2.4 09/29/2020 1604   CHOLHDL 3.8 02/25/2016 1737   VLDL 33 (H) 02/25/2016 1737   LDLCALC 22 09/29/2020 1604   Clinical Atherosclerotic Cardiovascular Disease (ASCVD): No   A/P: Diabetes longstanding with last A1c 11.2, but home blood glucoses showing appropriate control since starting Trulicity.  - Increase Trulicity 1.5 mg weekly. - Extensively discussed pathophysiology of diabetes, recommended lifestyle interventions, dietary effects on blood sugar control.  - Strongly encouraged to keep checking blood glucose at home and report any hypoglycemia. Education provided for hypoglycemia.  - Next A1C August 2023  HTN currently controlled in clinic and at home. Adherence appropriate with medications.  -Continue current BP medications. If any symptomatic low blood pressures reported, could consider discontinuing atenolol.  -Continue checking BP at home and bring log to next visit  Smoking cessation effort currently not at goal. Patient's cigarette use has increased recently due to stress and stopping Wellbutrin. Patient higher motivated to quit smoking and would like to restart medications for  smoking cessation. I anticipate if patient is able to quit smoking, may need to adjust blood pressure medications. Time spent: 5 minutes counseling face-to-face. -Continue wellbutrin -Start nicotine replacement with Nicoderm '21mg'$  patches and nicotine '4mg'$   gum. Educated on appropriate use.   Referred patient to get medications at [facility] and that we are able to assist with medication delivery as need to address some barrier to access to care. Written patient instructions provided. Total time in face to face counseling 35 minutes.    Follow-up in one month with pharmacist. Thank you for allowing pharmacy to participate in this patient's care.  Levonne Spiller, PharmD PGY1 Acute Care Resident  06/26/2021,12:14 PM

## 2021-06-27 ENCOUNTER — Other Ambulatory Visit (HOSPITAL_COMMUNITY): Payer: Self-pay

## 2021-06-27 MED ORDER — TRULICITY 1.5 MG/0.5ML ~~LOC~~ SOAJ
SUBCUTANEOUS | 3 refills | Status: DC
  Filled 2021-06-27: qty 2, 28d supply, fill #0
  Filled 2021-07-30: qty 2, 28d supply, fill #1

## 2021-06-27 MED FILL — Lisinopril Tab 20 MG: ORAL | 90 days supply | Qty: 90 | Fill #0 | Status: AC

## 2021-07-02 ENCOUNTER — Other Ambulatory Visit (HOSPITAL_COMMUNITY): Payer: Self-pay

## 2021-07-03 ENCOUNTER — Other Ambulatory Visit: Payer: Self-pay

## 2021-07-03 ENCOUNTER — Other Ambulatory Visit (HOSPITAL_COMMUNITY): Payer: Self-pay

## 2021-07-03 DIAGNOSIS — D1722 Benign lipomatous neoplasm of skin and subcutaneous tissue of left arm: Secondary | ICD-10-CM | POA: Diagnosis not present

## 2021-07-03 DIAGNOSIS — I10 Essential (primary) hypertension: Secondary | ICD-10-CM

## 2021-07-03 MED ORDER — AMLODIPINE BESYLATE 10 MG PO TABS
10.0000 mg | ORAL_TABLET | Freq: Every day | ORAL | 1 refills | Status: DC
  Filled 2021-07-03: qty 90, 90d supply, fill #0

## 2021-07-03 MED ORDER — ATENOLOL 100 MG PO TABS
100.0000 mg | ORAL_TABLET | Freq: Every day | ORAL | 1 refills | Status: DC
  Filled 2021-07-03: qty 90, 90d supply, fill #0

## 2021-07-15 ENCOUNTER — Other Ambulatory Visit (HOSPITAL_COMMUNITY): Payer: Self-pay

## 2021-07-16 ENCOUNTER — Ambulatory Visit: Payer: Medicare Other | Admitting: Plastic Surgery

## 2021-07-16 ENCOUNTER — Encounter: Payer: Self-pay | Admitting: Plastic Surgery

## 2021-07-16 VITALS — BP 128/73 | HR 70 | Ht 63.0 in | Wt 183.4 lb

## 2021-07-16 DIAGNOSIS — E1165 Type 2 diabetes mellitus with hyperglycemia: Secondary | ICD-10-CM

## 2021-07-16 DIAGNOSIS — F322 Major depressive disorder, single episode, severe without psychotic features: Secondary | ICD-10-CM

## 2021-07-16 DIAGNOSIS — F17210 Nicotine dependence, cigarettes, uncomplicated: Secondary | ICD-10-CM | POA: Diagnosis not present

## 2021-07-16 DIAGNOSIS — F17200 Nicotine dependence, unspecified, uncomplicated: Secondary | ICD-10-CM

## 2021-07-16 DIAGNOSIS — M25812 Other specified joint disorders, left shoulder: Secondary | ICD-10-CM

## 2021-07-16 NOTE — Progress Notes (Signed)
Patient ID: Christine Rocha, female    DOB: Aug 27, 1955, 66 y.o.   MRN: [id number]   Chief Complaint  Patient presents with   Consult    The patient is a 66 year old female here for evaluation of her left posterior shoulder.  Patient states she has had swelling or a mass on the posterior shoulder for the past 2 years.  She thinks it has changed over the years.  She also thinks that it may be causing her some pain.  She would like to have it removed.  She has a history of hypertension, seizures, diabetes and arthritis.  She is currently smoking.  The area is on the left upper shoulder and is approximately 4 x 4 cm.  No skin involvement.    Review of Systems  Constitutional: Negative.   Eyes: Negative.   Respiratory: Negative.  Negative for chest tightness.   Cardiovascular:  Positive for leg swelling.  Gastrointestinal: Negative.   Endocrine: Negative.   Genitourinary: Negative.   Musculoskeletal:  Positive for back pain.  Skin: Negative.     Past Medical History:  Diagnosis Date   Arthritis    Right Knee   Diabetes mellitus without complication ([place])    Hypertension    Seizures ([place])     Past Surgical History:  Procedure Laterality Date   COLONOSCOPY  2021   ORIF FEMUR FRACTURE Left 04/24/2017   Procedure: OPEN REDUCTION INTERNAL FIXATION (ORIF) DISTAL FEMUR FRACTURE;  Surgeon: [name], MD;  Location: [facility];  Service: Orthopedics;  Laterality: Left;      Current Outpatient Medications:    amLODipine (NORVASC) 10 MG tablet, Take 1 tablet (10 mg total) by mouth daily. To lower blood pressure, Disp: 90 tablet, Rfl: 1   atenolol (TENORMIN) 100 MG tablet, Take 1 tablet (100 mg total) by mouth daily., Disp: 90 tablet, Rfl: 1   Blood Glucose Monitoring Suppl (CONTOUR NEXT EZ) w/Device KIT, Use to check blood sugar once daily., Disp: 1 kit, Rfl: 0   buPROPion (WELLBUTRIN SR) 150 MG 12 hr tablet, Take 1 tablet (150 mg total) by mouth 2 (two) times daily. (To stop smoking),  Disp: 180 tablet, Rfl: 1   ciclopirox (PENLAC) 8 % solution, APPLY ONE COAT TO AFFECTED TONAILS EVERY DAY. REMOVE WEEKLY WITH POLISH REMOVER, Disp: 6.6 mL, Rfl: 11   Dulaglutide (TRULICITY) 1.5 UQ/3.3HL SOPN, Inject 1.5 mg into the skin once a week., Disp: 2 mL, Rfl: 3   Dulaglutide (TRULICITY) 1.5 KT/6.2BW SOPN, Inject 1.5 mg into the skin once a week, Disp: 2 mL, Rfl: 3   glucose blood (CONTOUR NEXT TEST) test strip, Use as directed to check blood sugar 3 times daily., Disp: 100 strip, Rfl: 3   hydrocortisone (ANUSOL-HC) 2.5 % rectal cream, Place 1 application. rectally 2 (two) times daily., Disp: 60 g, Rfl: 1   lisinopril (ZESTRIL) 20 MG tablet, TAKE 1 TABLET(20 MG) BY MOUTH DAILY, Disp: 90 tablet, Rfl: 1   Microlet Lancets MISC, Use to check blood sugar once daily., Disp: 100 each, Rfl: 0   Multiple Vitamins-Minerals (WOMENS 50+ MULTI VITAMIN/MIN) TABS, Take 1 tablet by mouth daily., Disp: , Rfl:    nicotine (NICODERM CQ - DOSED IN MG/24 HOURS) 21 mg/24hr patch, Place 1 patch (21 mg total) onto the skin once daily., Disp: 28 patch, Rfl: 1   nicotine polacrilex (NICORETTE) 4 MG gum, Take 1 each (4 mg total) by mouth daily as needed for smoking cessation., Disp: 100 tablet, Rfl: 0  nystatin-triamcinolone ointment (MYCOLOG), Apply 1 application. topically 2 (two) times daily. As needed under breasts and under stomach, Disp: 100 g, Rfl: 0   polyethylene glycol (MIRALAX / GLYCOLAX) 17 g packet, Take 17 g by mouth daily as needed. Mixed into 16 or more ounces of fluid, Disp: 30 each, Rfl: 6   rosuvastatin (CRESTOR) 10 MG tablet, Take 1 tablet (10 mg total) by mouth daily. To lower cholesterol, Disp: 90 tablet, Rfl: 1   Objective:   Vitals:   07/16/21 0943  BP: 128/73  Pulse: 70  SpO2: 96%    Physical Exam Vitals and nursing note reviewed.  Constitutional:      Appearance: Normal appearance.  HENT:     Head: Normocephalic and atraumatic.  Cardiovascular:     Rate and Rhythm: Normal rate.      Pulses: Normal pulses.  Musculoskeletal:        General: Swelling and deformity present.  Skin:    General: Skin is warm.     Capillary Refill: Capillary refill takes less than 2 seconds.     Coloration: Skin is not jaundiced.     Findings: Lesion present. No bruising.  Neurological:     Mental Status: She is alert and oriented to person, place, and time.  Psychiatric:        Mood and Affect: Mood normal.        Behavior: Behavior normal.     Assessment & Plan:  Type 2 diabetes mellitus with hyperglycemia, without long-term current use of insulin (HCC)  Smoking addiction  Current severe episode of major depressive disorder without psychotic features, unspecified whether recurrent (HCC)  Mass of joint of left shoulder  Recommend no smoking between now and surgery.  Recommend excision of left posterior shoulder mass.  It is most likely a lipoma.  We discussed the scar and the patient is aware and would like to move forward with removal.  Pictures were obtained of the patient and placed in the chart with the patient's or guardian's permission.   [place], DO

## 2021-07-17 ENCOUNTER — Other Ambulatory Visit: Payer: Self-pay | Admitting: Nurse Practitioner

## 2021-07-17 ENCOUNTER — Other Ambulatory Visit (HOSPITAL_COMMUNITY): Payer: Self-pay

## 2021-07-17 DIAGNOSIS — E1165 Type 2 diabetes mellitus with hyperglycemia: Secondary | ICD-10-CM

## 2021-07-17 MED ORDER — MICROLET LANCETS MISC
2 refills | Status: DC
  Filled 2021-07-17: qty 100, 90d supply, fill #0
  Filled 2021-09-17: qty 100, 90d supply, fill #1
  Filled 2021-09-17: qty 100, 25d supply, fill #1
  Filled 2021-11-16: qty 100, 25d supply, fill #2

## 2021-07-21 ENCOUNTER — Other Ambulatory Visit (HOSPITAL_COMMUNITY): Payer: Self-pay

## 2021-07-21 ENCOUNTER — Other Ambulatory Visit: Payer: Self-pay | Admitting: Nurse Practitioner

## 2021-07-21 DIAGNOSIS — E1165 Type 2 diabetes mellitus with hyperglycemia: Secondary | ICD-10-CM

## 2021-07-21 NOTE — Telephone Encounter (Signed)
Called pharmacy, refilled and mailed out to pt yesterday. Requested Prescriptions  Pending Prescriptions Disp Refills  . [place] [Pharmacy Med Name: MICROLET COLORED LANCETS 100] 100 each 2    Sig: USE TWICE DAILY     Endocrinology: Diabetes - Testing Supplies Passed - 07/21/2021  3:13 AM      Passed - Valid encounter within last 12 months    Recent Outpatient Visits          3 weeks ago Type 2 diabetes mellitus with hyperglycemia, without long-term current use of insulin [facility])   [place], Jarome Matin, RPH-CPP   3 months ago Essential hypertension   [facility] [place], Vernia Buff, NP   4 months ago Essential hypertension   [place], [place] W, NP   5 months ago Type 2 diabetes mellitus with hyperglycemia, without long-term current use of insulin [facility])   [place], Annie Main L, RPH-CPP   6 months ago Type 2 diabetes mellitus with hyperglycemia, without long-term current use of insulin [facility])   [place], RPH-CPP      Future Appointments            In 2 weeks Daisy [place]oss[place], Jarome Matin, [place]

## 2021-07-30 ENCOUNTER — Other Ambulatory Visit (HOSPITAL_COMMUNITY): Payer: Self-pay

## 2021-07-30 MED FILL — Ciclopirox Solution 8%: CUTANEOUS | 30 days supply | Qty: 6.6 | Fill #0 | Status: AC

## 2021-07-31 ENCOUNTER — Other Ambulatory Visit (HOSPITAL_COMMUNITY): Payer: Self-pay

## 2021-08-10 ENCOUNTER — Ambulatory Visit: Payer: Medicare Other | Attending: Nurse Practitioner | Admitting: Pharmacist

## 2021-08-10 ENCOUNTER — Other Ambulatory Visit: Payer: Self-pay

## 2021-08-10 ENCOUNTER — Ambulatory Visit: Payer: Medicare Other | Admitting: Dietician

## 2021-08-10 DIAGNOSIS — E1165 Type 2 diabetes mellitus with hyperglycemia: Secondary | ICD-10-CM | POA: Diagnosis not present

## 2021-08-10 MED ORDER — ATENOLOL 50 MG PO TABS
50.0000 mg | ORAL_TABLET | Freq: Every day | ORAL | 0 refills | Status: DC
  Filled 2021-08-10 (×2): qty 90, 90d supply, fill #0

## 2021-08-10 MED ORDER — TRULICITY 1.5 MG/0.5ML ~~LOC~~ SOAJ
1.5000 mg | SUBCUTANEOUS | 3 refills | Status: DC
  Filled 2021-08-10 – 2021-09-16 (×2): qty 2, 28d supply, fill #0

## 2021-08-10 NOTE — Progress Notes (Signed)
S:    PCP: Christine Rocha   Patient presents for diabetes evaluation, education, and management. Patient was referred and last seen by Primary Care Provider on 04/22/2021. At that visit BP was 142/79 mmHg and lisinopril dose was increased. Since then, she has had elevated BP readings in clinic and A1c has increased to 11.2 (5/23). Last saw CPP 06/26/21 for DM and HTN. At that visit, Trulicity was increased to 1.5 mg weekly. BP was good at 107/69 mmHg.   Today, Christine Rocha arrived in good spirits, ambulating with a cane. She continues to check her BP and BG daily. She confirmed adherence to all medications but has been out of Trulicity for ~4 weeks due to cost (was $37, but now >$200 since being sent from [facility] to [facility]). Has been out of trulicity for ~4 weeks. Has NOT taken BP medications today before appointment. Reports still having some dizziness and light headedness. Denies blurry vision, dizziness, or hypoglycemia. Reports some food insecurity.   Additionally, discussed smoking cessation efforts, currently smoking 1.5 ppd but had her last cigarette last night. She does not want to be a smoker by her birthday in a few days. She has been eating more sweets to curve cigarette cravings. She is staying away from others that smoke. Reports less cravings. Not using patched or gum because NRT didn't work for her in the past. Reports not taking Wellbutrin because she doesn't feel like she needs it. Set quit date for August 1st.   Current diabetes medications include:  -Trulicity 1.5 mg weekly on Monday- has been off for ~4 week due to cost (increased from $37 at [facility] to $200 at [facility])  Current hypertension medications include:  - amlodipine 10 mg daily - atenolol 100 mg daily - lisinopril 20 mg daily   Patient reported dietary habits: eating more sweets to help with cigarette cravings, drinking diet soda instead of regular,  Patient-reported exercise habits: walks her dog twice daily  Family/Social  History:  Family Hx: hypertension (mother, sister); diabetes (sister) Social Hx: current smoker (1.5 PPD), reports smoking last cigarette last night.   Insurance coverage/medication affordability: [employer]. Receives Trulicity through patient assistance.   O:  Home BP log (~month of data): this AM 133/78 (not taken meds this morning); range 99-133/58-74 mmHg Home BG log (~month of data): this AM 133, 93-152 (fasting), 131-244 (eating more sweets)     08/10/2021    8:57 AM 07/16/2021    9:43 AM 06/26/2021   12:14 PM 04/22/2021   10:59 AM 04/13/2021    4:30 PM  Blood Pressure  BP 113/68 128/73 107/69 142/79 147/82   Lab Results  Component Value Date   HGBA1C 11.2 (H) 05/13/2021   Lipid Panel     Component Value Date/Time   CHOL 78 (L) 09/29/2020 1604   TRIG 134 09/29/2020 1604   HDL 32 (L) 09/29/2020 1604   CHOLHDL 2.4 09/29/2020 1604   CHOLHDL 3.8 02/25/2016 1737   VLDL 33 (H) 02/25/2016 1737   LDLCALC 22 09/29/2020 1604   Clinical Atherosclerotic Cardiovascular Disease (ASCVD): No   A/P: Diabetes longstanding with last A1c 11.2, but home blood glucoses showing appropriate control since starting Trulicity.  - Restart Trulicity 1.5 mg weekly. Rx sent to [facility] at [facility]. Patient may need to start MAP. Will continue to follow.  - Extensively discussed pathophysiology of diabetes, recommended lifestyle interventions, dietary effects on blood sugar control.  - Strongly encouraged to keep checking blood glucose at home and  report any hypoglycemia. Education provided for hypoglycemia.  - Next A1C anticipated August 2023.  HTN currently controlled in clinic and at home. Adherence appropriate with medications. No compelling indication for beta blocker noted, will down titrate atenolol.  -Decrease atenolol to 50 mg daily. Consider discontinuing at next visit if BP remains at goal to reduce unnecessary pill burden. -Continue checking BP at home and bring log  to next visit.  Smoking cessation effort currently not at goal. Patient's reports she smoked her last cigarette last night. Patient set quit date of 08/11/21. Time spent: 5 minutes counseling face-to-face. -Discontinue Wellbutrin and NRT as patient reports she does not need it any longer.   Total time in face to face counseling 35 minutes.    Follow-up in one month with pharmacist. Thank you for allowing pharmacy to participate in this patient's care.  Joseph Art, Pharm.D. PGY-2 Ambulatory Care Pharmacy Resident 08/10/2021 2:00 PM

## 2021-08-11 ENCOUNTER — Other Ambulatory Visit: Payer: Self-pay

## 2021-08-11 ENCOUNTER — Other Ambulatory Visit (HOSPITAL_BASED_OUTPATIENT_CLINIC_OR_DEPARTMENT_OTHER): Payer: Self-pay

## 2021-08-12 ENCOUNTER — Other Ambulatory Visit: Payer: Self-pay

## 2021-08-12 ENCOUNTER — Other Ambulatory Visit: Payer: Self-pay | Admitting: Family Medicine

## 2021-08-12 DIAGNOSIS — I10 Essential (primary) hypertension: Secondary | ICD-10-CM

## 2021-08-17 ENCOUNTER — Encounter: Payer: Self-pay | Admitting: Podiatry

## 2021-08-17 ENCOUNTER — Ambulatory Visit (INDEPENDENT_AMBULATORY_CARE_PROVIDER_SITE_OTHER): Payer: Medicare Other | Admitting: Podiatry

## 2021-08-17 DIAGNOSIS — E1142 Type 2 diabetes mellitus with diabetic polyneuropathy: Secondary | ICD-10-CM | POA: Diagnosis not present

## 2021-08-17 DIAGNOSIS — B351 Tinea unguium: Secondary | ICD-10-CM

## 2021-08-17 DIAGNOSIS — L84 Corns and callosities: Secondary | ICD-10-CM | POA: Diagnosis not present

## 2021-08-17 DIAGNOSIS — M79674 Pain in right toe(s): Secondary | ICD-10-CM

## 2021-08-17 DIAGNOSIS — M79675 Pain in left toe(s): Secondary | ICD-10-CM

## 2021-08-17 NOTE — Progress Notes (Signed)
  Subjective:  Patient ID: Christine Rocha, female    DOB: [birth year],  MRN: [id number]  [name] presents to clinic today for at risk foot care with history of diabetic neuropathy and callus(es) b/l lower extremities and painful thick toenails that are difficult to trim. Painful toenails interfere with ambulation. Aggravating factors include wearing enclosed shoe gear. Pain is relieved with periodic professional debridement. Painful calluses are aggravated when weightbearing with and without shoegear. Pain is relieved with periodic professional debridement.  Last A1c was 11.2%.  Patient did not check blood glucose today.  New problem(s): None.   She states her husband has been sick.  PCP is Gildardo Pounds, NP , and last visit was  April 22, 2021  Allergies  Allergen Reactions   Penicillins Nausea Only    Review of Systems: Negative except as noted in the HPI.  Objective: No changes noted in today's physical examination. There were no vitals filed for this visit.  Christine Rocha is a pleasant 66 y.o. female in NAD. AAO X 3.  Vascular Examination: CFT immediate b/l LE. Palpable DP/PT pulses b/l LE. Digital hair sparse b/l. Skin temperature gradient WNL b/l. No pain with calf compression b/l. No edema noted b/l. No cyanosis or clubbing noted b/l LE.  Dermatological Examination: Pedal integument with normal turgor, texture and tone BLE. No open wounds b/l LE. No interdigital macerations noted b/l LE. Toenails 1-5 b/l elongated, discolored, dystrophic, thickened, crumbly with subungual debris and tenderness to dorsal palpation. Hyperkeratotic lesion(s) bilateral heels and bilateral great toes.  No erythema, no edema, no drainage, no fluctuance.  Musculoskeletal Examination: Muscle strength 5/5 to all lower extremity muscle groups bilaterally. No pain, crepitus or joint limitation noted with ROM bilateral LE. No gross bony deformities bilaterally. Utilizes rollator for ambulation  assistance.   Neurological Examination: Protective sensation decreased with 10 gram monofilament b/l. Vibratory sensation intact b/l.    Latest Ref Rng & Units 05/13/2021    8:40 AM 02/09/2021    9:16 AM 09/29/2020    3:44 PM  Hemoglobin A1C  Hemoglobin-A1c 4.8 - 5.6 % 11.2  7.3  12.8    Assessment/Plan: 1. Pain due to onychomycosis of toenail   2. Callus   3. Diabetic peripheral neuropathy associated with type 2 diabetes mellitus ([place])     -Examined patient. -Continue foot and shoe inspections daily. Monitor blood glucose per PCP/Endocrinologist's recommendations. -Patient to continue soft, supportive shoe gear daily. -Toenails 1-5 b/l were debrided in length and girth with sterile nail nippers and dremel without iatrogenic bleeding.  -Callus(es) bilateral heels and bilateral great toes pared utilizing sterile scalpel blade without complication or incident. Total number debrided =4. -Patient/POA to call should there be question/concern in the interim.   Return in about 3 months (around 11/17/2021).  Marzetta Board, DPM

## 2021-08-24 ENCOUNTER — Ambulatory Visit: Payer: Self-pay

## 2021-08-24 NOTE — Patient Outreach (Signed)
  Care Coordination   08/24/2021 Name: Christine Rocha MRN: [id number] DOB: Mar 11, 1955   Care Coordination Outreach Attempts:  An unsuccessful telephone outreach was attempted today to offer the patient information about available care coordination services as a benefit of their health plan.   Follow Up Plan:  Additional outreach attempts will be made to offer the patient care coordination information and services.   Encounter Outcome:  No Answer  Care Coordination Interventions Activated:  No   Care Coordination Interventions:  No, not indicated    Daneen Schick, BSW, CDP Social Worker, Certified Dementia Practitioner Care Coordination [phone]

## 2021-08-25 ENCOUNTER — Ambulatory Visit: Payer: Self-pay

## 2021-08-25 NOTE — Patient Instructions (Addendum)
Visit Information  Thank you for taking time to visit with me today. Please don't hesitate to contact me if I can be of assistance to you.   Following are the goals we discussed today:   Goals Addressed             This Visit's Progress    Care Coordination Activities       Care Coordination Interventions: SDoH screening completed - determined the patient is experiencing food insecurity Referrals placed to [employer] surveyor as well as [employer] via [place] the patient an application for Food and Nutrition Services Education provided on the benefit of advance care planning - patient would like to complete an advance directive Mailed the patient an advance directive- discussed plans for SW to review document telephonically once it is received Discussed the patient is overdue for her annual wellness visit - patient is interested in completing this visit Collaboration with Boykin Reaper, RMA with patients primary care providers office requesting patient be contacted to assist with scheduling an AWV Performed chart review to note patients recent A1C at 11.2  Educated on role of care coordination team - patient agreeable to speak with [place] for disease management Scheduled telephonic visit with [place] for Tuesday 8/22        Our next appointment is by telephone on 09/09/21 at 10:00  Please call the care guide team at [phone] if you need to cancel or reschedule your appointment.   If you are experiencing a Mental Health or [place] or need someone to talk to, please call 1-800-273-TALK (toll free, 24 hour hotline)  Patient verbalizes understanding of instructions and care plan provided today and agrees to view in [place]. Active MyChart status and patient understanding of how to access instructions and care plan via MyChart confirmed with patient.     Telephone follow up appointment with care management team  member scheduled for:8/22 to speak with Glenard Haring our nurse case manager. I will call you on 8/30 to follow up on status of referrals for food resources.  Daneen Schick, BSW, CDP Social Worker, Certified Dementia Practitioner Care Coordination [phone]

## 2021-08-25 NOTE — Patient Outreach (Signed)
  Care Coordination   Initial Visit Note   08/25/2021 Name: Christine Rocha MRN: [id number] DOB: 07-21-1955  Christine Rocha is a 66 y.o. year old female who sees Gildardo Pounds, NP for primary care. I spoke with  Christine Rocha by phone today  What matters to the patients health and wellness today?  I wanted to make sure I do not run out of food    Goals Addressed             This Visit's Progress    Care Coordination Activities       Care Coordination Interventions: SDoH screening completed - determined the patient is experiencing food insecurity Referrals placed to [employer] surveyor as well as [employer] via [place] the patient an application for Food and Nutrition Services Education provided on the benefit of advance care planning - patient would like to complete an advance directive Mailed the patient an advance directive- discussed plans for SW to review document telephonically once it is received Discussed the patient is overdue for her annual wellness visit - patient is interested in completing this visit Collaboration with Boykin Reaper, RMA with patients primary care providers office requesting patient be contacted to assist with scheduling an AWV Performed chart review to note patients recent A1C at 11.2  Educated on role of care coordination team - patient agreeable to speak with [place] for disease management Scheduled telephonic visit with RN Care Manager for Tuesday 8/22        SDOH assessments and interventions completed:  Yes  SDOH Interventions Today    Flowsheet Row Most Recent Value  SDOH Interventions   Food Insecurity Interventions Assist with SNAP Application, [id number] Referral  Housing Interventions Intervention Not Indicated  Transportation Interventions Intervention Not Indicated        Care Coordination Interventions Activated:  Yes  Care Coordination Interventions:  Yes, provided   Follow up plan:  Referral made to RN Care Manager    Encounter Outcome:  Pt. Visit Completed   Daneen Schick, BSW, CDP Social Worker, Certified Dementia Practitioner Care Coordination [phone]

## 2021-08-27 ENCOUNTER — Other Ambulatory Visit: Payer: Self-pay

## 2021-09-01 ENCOUNTER — Ambulatory Visit: Payer: Self-pay

## 2021-09-01 NOTE — Patient Outreach (Signed)
  Care Coordination   09/01/2021 Name: Christine Rocha MRN: [id number] DOB: 1955-03-07   Care Coordination Outreach Attempts:  An unsuccessful telephone outreach was attempted today to offer the patient information about available care coordination services as a benefit of their health plan.   Follow Up Plan:  Additional outreach attempts will be made to offer the patient care coordination information and services.   Encounter Outcome:  No Answer  Care Coordination Interventions Activated:  No   Care Coordination Interventions:  No, not indicated    Barb Merino, RN, BSN, CCM Care Management Coordinator [facility] Care Management Direct Phone: [phone]

## 2021-09-07 ENCOUNTER — Other Ambulatory Visit: Payer: Self-pay | Admitting: Nurse Practitioner

## 2021-09-07 ENCOUNTER — Encounter: Payer: Medicare Other | Attending: Nurse Practitioner | Admitting: Dietician

## 2021-09-07 ENCOUNTER — Encounter: Payer: Self-pay | Admitting: Dietician

## 2021-09-07 DIAGNOSIS — E1165 Type 2 diabetes mellitus with hyperglycemia: Secondary | ICD-10-CM | POA: Insufficient documentation

## 2021-09-07 NOTE — Progress Notes (Addendum)
Diabetes Self-Management Education  Visit Type: Follow-up  Appt. Start Time: 0805 Appt. End Time: 3007  09/07/2021  [name], identified by name and date of birth, is a 66 y.o. female with a diagnosis of Diabetes:  .   ASSESSMENT Patient is here today alone.  She was last seen by this RD 06/16/2021. She states that she is drinking more water and diet soda, is cutting back on her food intake and walks her dog daily for 1-2 hours per day. She continues to smoke about 1/2 pack per day (10-15).Husband has Lupus and she is worried about him. Her microwave stopped working.  Her oven does not work.  They are saving up money to purchase more. She is keeping records of blood sugar and blood pressure. Blood pressure 118/76 Fasting blood glucose 110-205 Blood glucose post meal 95-204   History includes:  Type 2 Diabetes, HTN, arthritis Medications include:  Metformin (stopped), Lantus (stopped), Trulicity(ran out and not taking) Labs noted:  A1C 11.2% 05/13/2021, 7.3% 02/09/2021, 12.8%, eGFR 65, Cholesterol 78, HDL 32, LDL 22, Triglycerides 134 09/29/2020   Weight: 186 lbs 09/07/2021   182 lbs 06/16/2021 190 lbs 04/13/2021 (lost with uncontrolled glucose) 197 lbs 02/17/21 189 lbs 11/28/2020   Patient lives with her husband.  They share the shopping and cooking.  They eat out most of the time (fast food) but has changed to TV dinners most frequently since diabetes diagnosis. They rely on public transportation. She states that some vegetables (broccoli, cauliflower, and squash s) don't agree with her - make her want to vomit.  She enjoys cabbage and turnip greens.   She is on disability.  Walks with a cane or walker.   She smokes and has tried to quit without success. Food insecurity and relies on food banks to supplement.  Skips dinner a couple times a week due to money.  She was given the shopping at the [employer] handout on previous visit.  Weight 186 lb (84.4 kg). Body mass index is 32.95  kg/m.   Diabetes Self-Management Education - 09/07/21 0844       Visit Information   Visit Type Follow-up      Psychosocial Assessment   Patient Belief/Attitude about Diabetes Motivated to manage diabetes    What is the hardest part about your diabetes right now, causing you the most concern, or is the most worrisome to you about your diabetes?   Making healty food and beverage choices    Self-care barriers Lack of material resources;Low literacy    Self-management support Doctor's office;CDE visits    Other persons present Patient    Patient Concerns Nutrition/Meal planning    Special Needs None    Preferred Learning Style No preference indicated    Learning Readiness Ready    How often do you need to have someone help you when you read instructions, pamphlets, or other written materials from your doctor or pharmacy? 1 - Never      Pre-Education Assessment   Patient understands the diabetes disease and treatment process. Comprehends key points    Patient understands incorporating nutritional management into lifestyle. Comprehends key points    Patient undertands incorporating physical activity into lifestyle. Comprehends key points    Patient understands using medications safely. Comprehends key points    Patient understands prevention, detection, and treatment of acute complications. Comprehends key points    Patient understands prevention, detection, and treatment of chronic complications. Compreheands key points    Patient understands how to  develop strategies to address psychosocial issues. Comprehends key points    Patient understands how to develop strategies to promote health/change behavior. Comprehends key points      Complications   How often do you check your blood sugar? 1-2 times/day    Fasting Blood glucose range (mg/dL) 70-129;130-179;180-200    Postprandial Blood glucose range (mg/dL) 70-129;130-179;180-200    Number of hyperglycemic episodes ( >26m/dL): Never       Dietary Intake   Breakfast skips    Lunch Oodles of Noodles, greens    Snack (afternoon) grapes    Dinner ham, t[place] and cheese sandwich OR TV dinners      Activity / Exercise   Activity / Exercise Type Light (walking / raking leaves)    How many days per week do you exercise? 7    How many minutes per day do you exercise? 90    Total minutes per week of exercise 630      Patient Education   Previous Diabetes Education Yes (please comment)   06/2021   Healthy Eating Meal options for control of blood glucose level and chronic complications.;Other (comment)   eating on a budget   Medications Reviewed patients medication for diabetes, action, purpose, timing of dose and side effects.    Monitoring Taught/evaluated SMBG meter.      Individualized Goals (developed by patient)   Nutrition General guidelines for healthy choices and portions discussed    Physical Activity Exercise 5-7 days per week;60 minutes per day    Medications take my medication as prescribed    Monitoring  Test my blood glucose as discussed    Problem Solving Eating Pattern;Addressing barriers to behavior change    Reducing Risk stop smoking;do foot checks daily      Patient Self-Evaluation of Goals - Patient rates self as meeting previously set goals (% of time)   Nutrition 50 - 75 % (half of the time)    Physical Activity >75% (most of the time)    Medications >75% (most of the time)    Monitoring >75% (most of the time)    Problem Solving and behavior change strategies  50 - 75 % (half of the time)    Reducing Risk (treating acute and chronic complications) 50 - 75 % (half of the time)    Health Coping >75% (most of the time)      Post-Education Assessment   Patient understands the diabetes disease and treatment process. Demonstrates understanding / competency    Patient understands incorporating nutritional management into lifestyle. Comprehends key points    Patient undertands incorporating physical  activity into lifestyle. Demonstrates understanding / competency    Patient understands using medications safely. Demonstrates understanding / competency    Patient understands monitoring blood glucose, interpreting and using results Demonstrates understanding / competency    Patient understands prevention, detection, and treatment of acute complications. Demonstrates understanding / competency    Patient understands prevention, detection, and treatment of chronic complications. Demonstrates understanding / competency    Patient understands how to develop strategies to address psychosocial issues. Demonstrates understanding / competency    Patient understands how to develop strategies to promote health/change behavior. Demonstrates understanding / competency      Outcomes   Expected Outcomes Demonstrated interest in learning. Expect positive outcomes    Future DMSE 3-4 months    Program Status Not Completed      Subsequent Visit   Since your last visit have you continued or begun to take  your medications as prescribed? Yes    Since your last visit have you had your blood pressure checked? Yes    Is your most recent blood pressure lower, unchanged, or higher since your last visit? Unchanged    Since your last visit have you experienced any weight changes? Gain    Weight Gain (lbs) 4    Since your last visit, are you checking your blood glucose at least once a day? Yes             Individualized Plan for Diabetes Self-Management Training:   Learning Objective:  Patient will have a greater understanding of diabetes self-management. Patient education plan is to attend individual and/or group sessions per assessed needs and concerns.   Plan:   Patient Instructions  Recommend 1% milk rather than whole or 2%. Continue to check your blood glucose Continue to take your medication Boil the neck bones, remove the meat, add vegetables and potatoes,rice, noodles or pasta to make a good  soup.  Recommend putting 4 cigarettes in a glass for the day, smoke only outside, and gradually lower the number of cigarettes you use daily.   Talk to your Mentor or neighbor when you are feeling stressed.  Consider searching for "you tube eating on a budget"   Great job on the changes that you have made!      Expected Outcomes:  Demonstrated interest in learning. Expect positive outcomes  Education material provided:   If problems or questions, patient to contact team via:  Phone  Future DSME appointment: 3-4 months

## 2021-09-07 NOTE — Patient Instructions (Signed)
Recommend 1% milk rather than whole or 2%. Continue to check your blood glucose Continue to take your medication Boil the neck bones, remove the meat, add vegetables and potatoes,rice, noodles or pasta to make a good soup.  Recommend putting 4 cigarettes in a glass for the day, smoke only outside, and gradually lower the number of cigarettes you use daily.   Talk to your Mentor or neighbor when you are feeling stressed.  Consider searching for "you tube eating on a budget"   Great job on the changes that you have made!

## 2021-09-09 ENCOUNTER — Ambulatory Visit: Payer: Self-pay

## 2021-09-09 NOTE — Patient Instructions (Signed)
Visit Information  Thank you for taking time to visit with me today. Please don't hesitate to contact me if I can be of assistance to you.   Following are the goals we discussed today:   Goals Addressed             This Visit's Progress    Care Coordination Activities       Care Coordination Interventions: Reviewed status of referrals via [email or web address] to determine neither referral has been acted on Outbound call placed to [employer] - voice message left. Reviewed website to note pantry is open the 1st and 3rd Wednesday from 9-11:15 Outbound call placed to [employer] to determine pantry is open the 3rd Saturday of the month from 10-12pm. Patient will need to wear a mask to enter Spoke with patient to review above information - mailed information to patients home address Confirmed patient did receive advance directive packet as well as food and nutrition application - patient requests face to face assistance with completion Advised the patient SW does not make home visits but there may be someone in her primary care providers office that can assist during an office visit Collaboration with [place] Management Coordination with [facility] and Wellness to request follow up with patient Determined the patient continues to experience food insecurity stating she has been eating ramen noodles for meals Attempted to arrange Mom's Meals to assist with food insecurity while patient awaits to access food pantry sites - unable to order due to patients microwave being broken Discussed patients microwave is broken and she only has one burner that is working on her stove therefore she cannot make many food options Advised the patient SW would look into alternative resource options Collaboration with [facility] to determine if other resources are available to assist with patients food insecurity         If you are experiencing a Mental  Health or Ali Molina or need someone to talk to, please call 1-800-273-TALK (toll free, 24 hour hotline)  Patient verbalizes understanding of instructions and care plan provided today and agrees to view in [place]. Active MyChart status and patient understanding of how to access instructions and care plan via MyChart confirmed with patient.     I will contact you once I research resource options. Please call me as needed.  Daneen Schick, BSW, CDP Social Worker, Certified Dementia Practitioner Care Coordination [phone]

## 2021-09-09 NOTE — Patient Outreach (Signed)
  Care Coordination   Follow Up Visit Note   09/09/2021 Name: Christine Rocha MRN: [id number] DOB: 03/11/1955  Christine Rocha is a 66 y.o. year old female who sees Gildardo Pounds, NP for primary care. I spoke with  Christine Rocha by phone today.  What matters to the patients health and wellness today?  I need more food in my home    Goals Addressed             This Visit's Progress    Care Coordination Activities       Care Coordination Interventions: Reviewed status of referrals via [facility] to determine neither referral has been acted on Outbound call placed to [employer] - voice message left. Reviewed website to note pantry is open the 1st and 3rd Wednesday from 9-11:15 Outbound call placed to [employer] to determine pantry is open the 3rd Saturday of the month from 10-12pm. Patient will need to wear a mask to enter Spoke with patient to review above information - mailed information to patients home address Confirmed patient did receive advance directive packet as well as food and nutrition application - patient requests face to face assistance with completion Advised the patient SW does not make home visits but there may be someone in her primary care providers office that can assist during an office visit Collaboration with [place] Management Coordination with [facility] and Wellness to request follow up with patient Determined the patient continues to experience food insecurity stating she has been eating ramen noodles for meals Attempted to arrange Mom's Meals to assist with food insecurity while patient awaits to access food pantry sites - unable to order due to patients microwave being broken Discussed patients microwave is broken and she only has one burner that is working on her stove therefore she cannot make many food options Advised the patient SW would look into alternative resource options Collaboration with [facility] to determine if other resources are available to assist with patients food insecurity         SDOH assessments and interventions completed:  No     Care Coordination Interventions Activated:  Yes  Care Coordination Interventions:  Yes, provided   Follow up plan:  SW will continue to follow    Encounter Outcome:  Pt. Visit Completed   Daneen Schick, BSW, CDP Social Worker, Certified Dementia Practitioner Care Coordination [phone]

## 2021-09-11 ENCOUNTER — Ambulatory Visit: Payer: Self-pay

## 2021-09-11 NOTE — Patient Outreach (Signed)
  Care Coordination   09/11/2021 Name: Christine Rocha MRN: [id number] DOB: Nov 01, 1955   Care Coordination Outreach Attempts:  An unsuccessful telephone outreach was attempted today to offer the patient information about available care coordination services as a benefit of their health plan.   Follow Up Plan:  Additional outreach attempts will be made to offer the patient care coordination information and services.   Encounter Outcome:  No Answer  Care Coordination Interventions Activated:  No   Care Coordination Interventions:  No, not indicated    Daneen Schick, BSW, CDP Social Worker, Certified Dementia Practitioner Care Coordination [phone]

## 2021-09-15 ENCOUNTER — Telehealth: Payer: Self-pay

## 2021-09-15 NOTE — Patient Outreach (Signed)
  Care Coordination   09/15/2021 Name: Christine Rocha MRN: [id number] DOB: [birth year]   Care Coordination Outreach Attempts:  A second unsuccessful outreach was attempted today to offer the patient with information about available care coordination services as a benefit of their health plan.     Follow Up Plan:  Additional outreach attempts will be made to offer the patient care coordination information and services.   Encounter Outcome:  No Answer  Care Coordination Interventions Activated:  No   Care Coordination Interventions:  No, not indicated    Daneen Schick, BSW, CDP Social Worker, Certified Dementia Practitioner Care Coordination [phone]

## 2021-09-16 ENCOUNTER — Other Ambulatory Visit (HOSPITAL_COMMUNITY): Payer: Self-pay

## 2021-09-16 NOTE — Progress Notes (Signed)
S:    PCP: Geryl Rankins, NP  Christine Rocha is a 66 y.o. female who presents for hypertension and diabetes evaluation, education, and management. PMH is significant for HTN, T2DM, osteoarthritis, and depression.   Patient was referred and last seen by Primary Care Provider, Geryl Rankins, on 04/22/21. At that visit, lisinopril was increased to '20mg'$  once daily. She endorsed smoking 3 cigarettes per day at this visit. She also stated she had not picked up her Trulicity from the pharmacy at that visit.   At last visit with the pharmacy team on 7/31, she had not picked up her increased dose of Trulicity, leaving her about 4 weeks without any diabetic medications at that time.  BP at that visit was at goal, however, she had not taken her BP medications prior to the appointment.  Today, patient arrives in good spirits and presents without any assistance. She has received her Trulicity 1.'5mg'$  weekly dose and has received 3 injections prior to this appointment. Her main barrier is food insecurity. The patient and her spouse has been limited on access to food d/t funds. She currently will eat only one meal a day, if she eats at all. She stated she missed all meals 3 days in a row last week. When they do eat, it tends to be items like Udon noodles with some flavoring packets.   Family/Social History:  -Family hx: htn, dm -Tobacco: endorses smoking -Alcohol: denies  Current diabetes medications include: Trulicity 1.'5mg'$  once a week Current hypertension medications include: amlodipine '10mg'$  once daily, atenolol '50mg'$  once daily, lisinopril '20mg'$  once daily Current hyperlipidemia medications include: rosuvastatin '10mg'$  once daily  Patient reports adherence to taking all medications as prescribed. Patient took her BP medications this morning.   Do you feel that your medications are working for you? yes Have you been experiencing any side effects to the medications prescribed? no Do you have any problems  obtaining medications due to transportation or finances? no Insurance coverage: [place] [employer]  Reported home fasting blood sugars: average of 120-160s; one high over the last month in the low 200's. Patient denies any hypoglycemic events and no BG levels below 90.  Patient reports nocturia (nighttime urination). About 3 times, normally only once in the night  Patient reports neuropathy (nerve pain). Patient reports visual changes. Patient denies self foot exams.   Within the past 12 months, did you worry whether your food would run out before you got money to buy more? yes Within the past 12 months, did the food you bought run out, and you didn't have money to get more? yes  Patient-reported exercise habits: walks her dog most mornings and evenings sometimes 1-2 hour walk   O:  Vitals:   09/17/21 0906  BP: 119/75   Lab Results  Component Value Date   HGBA1C 7.3 (A) 09/17/2021   Vitals:   09/17/21 0906  BP: 119/75    Lipid Panel     Component Value Date/Time   CHOL 78 (L) 09/29/2020 1604   TRIG 134 09/29/2020 1604   HDL 32 (L) 09/29/2020 1604   CHOLHDL 2.4 09/29/2020 1604   CHOLHDL 3.8 02/25/2016 1737   VLDL 33 (H) 02/25/2016 1737   LDLCALC 22 09/29/2020 1604    A/P: Diabetes longstanding currently close to goal. Current A1c close to goal with a level of 7.3. Commended her for her progress. It is likely she is not <7% due to medication nonadherence. She went several weeks with the medication earlier  in the summer. Patient is able to verbalize appropriate hypoglycemia management plan. Medication adherence appears appropriate. Control is suboptimal due to financial barriers limiting medication and food access.  -Continued GLP-1 Trulicity 1.'5mg'$  once a week -Extensively discussed pathophysiology of diabetes, recommended lifestyle interventions, dietary effects on blood sugar control.  -Counseled on s/sx of and management of hypoglycemia.  -Next A1c anticipated 12/2021.    ASCVD risk - primary prevention in patient with diabetes. Last LDL is at goal of <70 mg/dL. ASCVD risk factors include smoking status and DM. High intensity statin indicated.  Could discuss intensification at next visit and lipid panel. -Continued rosuvastatin 10 mg.   Hypertension longstanding currently controlled. Blood pressure goal of <130/80  mmHg. Medication adherence appropriate. Could benefit from CMP at next visit. -Continued amlodipine '10mg'$  once daily -Continued atenolol '50mg'$  once daily -Continued lisinopril '20mg'$  once daily  Written patient instructions provided. Patient verbalized understanding of treatment plan.  Total time in face to face counseling 20 minutes.    Follow-up:  Pharmacist in October.  Lind Guest, PharmD PGY-[facility] Pharmacy Resident

## 2021-09-17 ENCOUNTER — Ambulatory Visit: Payer: Self-pay

## 2021-09-17 ENCOUNTER — Telehealth: Payer: Self-pay

## 2021-09-17 ENCOUNTER — Ambulatory Visit: Payer: Medicare Other | Attending: Nurse Practitioner | Admitting: Pharmacist

## 2021-09-17 ENCOUNTER — Other Ambulatory Visit: Payer: Self-pay

## 2021-09-17 DIAGNOSIS — I10 Essential (primary) hypertension: Secondary | ICD-10-CM

## 2021-09-17 DIAGNOSIS — E1165 Type 2 diabetes mellitus with hyperglycemia: Secondary | ICD-10-CM

## 2021-09-17 LAB — POCT GLYCOSYLATED HEMOGLOBIN (HGB A1C): HbA1c, POC (controlled diabetic range): 7.3 % — AB (ref 0.0–7.0)

## 2021-09-17 MED ORDER — AMLODIPINE BESYLATE 10 MG PO TABS
10.0000 mg | ORAL_TABLET | Freq: Every day | ORAL | 1 refills | Status: DC
  Filled 2021-09-17 – 2021-11-16 (×2): qty 90, 90d supply, fill #0

## 2021-09-17 MED ORDER — TRULICITY 1.5 MG/0.5ML ~~LOC~~ SOAJ
1.5000 mg | SUBCUTANEOUS | 3 refills | Status: DC
  Filled 2021-09-17: qty 2, 28d supply, fill #0

## 2021-09-17 NOTE — Patient Outreach (Signed)
  Care Coordination   09/17/2021 Name: Christine Rocha MRN: [id number] DOB: 05-May-1955   Care Coordination Outreach Attempts:  An unsuccessful telephone outreach was attempted for a scheduled appointment today.  Follow Up Plan:  Additional outreach attempts will be made to offer the patient care coordination information and services.   Encounter Outcome:  No Answer  Care Coordination Interventions Activated:  No   Care Coordination Interventions:  No, not indicated    Barb Merino, RN, BSN, CCM Care Management Coordinator [facility] Care Management Direct Phone: [phone]

## 2021-09-17 NOTE — Telephone Encounter (Signed)
Message received from [facility], BSW/THN  stating that the patient needs assistance with completing an Advance Directive packet as well as an application for Food and Nutrition services. She also had concerns about patient's food insecurity. She does not have a microwave and cannot afford to purchase a new one. Only one burner is working on her stove.   I called patient # [phone] and left message requesting a call back.  I want to determine how we can assist her with completing the documents and also inquire if she would like to be referred to [facility] [employer] [facility] for assistance with obtaining food. They will also help with the food stamp application.

## 2021-09-18 ENCOUNTER — Telehealth: Payer: Self-pay

## 2021-09-18 ENCOUNTER — Telehealth: Payer: Self-pay | Admitting: Emergency Medicine

## 2021-09-18 NOTE — Patient Outreach (Signed)
  Care Coordination   09/18/2021 Name: Christine Rocha MRN: [id number] DOB: [birth year]   Care Coordination Outreach Attempts:  A third unsuccessful outreach was attempted today to offer the patient with information about available care coordination services as a benefit of their health plan.   Follow Up Plan:  Additional outreach attempts will be made to offer the patient care coordination information and services.   Encounter Outcome:  No Answer  Care Coordination Interventions Activated:  No   Care Coordination Interventions:  No, not indicated    Daneen Schick, BSW, CDP Social Worker, Certified Dementia Practitioner Care Coordination [phone]

## 2021-09-18 NOTE — Telephone Encounter (Signed)
Copied from [place] [phone]. Topic: General - Other >> Sep 18, 2021  8:30 AM Chapman Fitch wrote: Reason for CRM: pt returning [facility] call / please advise >> Sep 18, 2021 11:09 AM Cyndi Bender wrote: Pt requests that Opal Sidles return her call. Cb# [phone]

## 2021-09-18 NOTE — Telephone Encounter (Signed)
Copied from [place] [phone]. Topic: General - Other >> Sep 18, 2021  8:30 AM Chapman Fitch wrote: Reason for CRM: pt returning [facility] call / please advise

## 2021-09-18 NOTE — Telephone Encounter (Signed)
Duplicate message. 

## 2021-09-18 NOTE — Telephone Encounter (Signed)
I returned the call to the patient.  She explained that her oven, microwave, and 3 burners on her stove do not work.  She is also worried that her refrigerator is starting to give out.  She has a limited income $782/month disability and rent is $400/month.  She had to provide her own appliances for the apartment.  She lives with her husband who  works when he can. He has lupus and she said he may need to be hospitalized and she said that his doctor has mentioned that he might need to take him out of work and he will need to apply for disability. His weekly income varies.  They do not have a car and they rely on the city bus for transportation. She explained that she needs help with the application for food stamps as well as the advance directive and she will bring those documents with her when she comes to her appointment at [facility] on 10/20/2021 and I will assist her. She was in agreement to submitting a referral to [facility] [employer] for assistance with obtaining food.  I told her I will have to check community resources for possible assistance with obtaining new appliances.  She was very appreciative of any assistance that can be provided.

## 2021-09-21 ENCOUNTER — Other Ambulatory Visit: Payer: Self-pay

## 2021-09-21 MED FILL — Ciclopirox Solution 8%: CUTANEOUS | 30 days supply | Qty: 6.6 | Fill #0 | Status: AC

## 2021-09-21 NOTE — Telephone Encounter (Signed)
Referral sent to [facility]

## 2021-09-21 NOTE — Telephone Encounter (Signed)
Call received from patient.  She explained that she was returning my call.  I reminded her that we spoke on Friday - 9/8 and she said she forgot for a minute.    I then explained to her that I spoke to [facility] [place] regarding her need for appliances, particularly a microwave.  And I then provided her with Courtney's contact at [place] # [phone] and instructed her to call and discuss her need for appliances and they may have some resources for her.

## 2021-09-22 ENCOUNTER — Ambulatory Visit: Payer: Self-pay

## 2021-09-22 NOTE — Patient Outreach (Signed)
  Care Coordination   Follow Up Visit Note   09/22/2021 Name: Christine Rocha MRN: [id number] DOB: [birth year]  Christine Rocha is a 66 y.o. year old female who sees Gildardo Pounds, NP for primary care. I  attempted to contact the patient today to assist with care coordination needs.  What matters to the patients health and wellness today?  Unable to make contact    Goals Addressed             This Visit's Progress    COMPLETED: Care Coordination Activities       Care Coordination Interventions: Fourth unsuccessful call placed to the patient to assist with resource needs Goal closed due to inability to maintain patient contact          SDOH assessments and interventions completed:  No     Care Coordination Interventions Activated:  No  Care Coordination Interventions:  No, not indicated   Follow up plan:  Fourth unsuccessful call - no follow up planned.    Encounter Outcome:  Pt. Visit Completed   Daneen Schick, BSW, CDP Social Worker, Certified Dementia Practitioner Care Coordination [phone]

## 2021-09-24 ENCOUNTER — Ambulatory Visit: Payer: Self-pay

## 2021-09-24 NOTE — Patient Outreach (Signed)
  Care Coordination   09/24/2021 Name: MADELEINE FENN MRN: [id number] DOB: May 27, 1955   Care Coordination Outreach Attempts:  An unsuccessful telephone outreach was attempted for a scheduled appointment today.  Follow Up Plan:  No further outreach attempts will be made at this time. We have been unable to contact the patient to offer or enroll patient in care coordination services  Encounter Outcome:  No Answer  Care Coordination Interventions Activated:  No   Care Coordination Interventions:  No, not indicated    Barb Merino, RN, BSN, CCM Care Management Coordinator [facility]  Direct Phone: [phone]

## 2021-09-28 ENCOUNTER — Other Ambulatory Visit (HOSPITAL_COMMUNITY): Payer: Self-pay

## 2021-10-01 NOTE — Telephone Encounter (Signed)
I called the patient to inquire if she heard from the [facility] about the microwave.  She said that they called her back and left a message but they keep missing each other.  I encouraged her to keep calling.  Regarding the referral to [facility],  she said that they contacted her but she doesn't eat the kind of food they have.  She said they told her that she needed a car even though the referral requested delivery.  She said she has their phone number and can call back when she gets a microwave.

## 2021-10-15 ENCOUNTER — Ambulatory Visit: Payer: Self-pay | Admitting: Licensed Clinical Social Worker

## 2021-10-15 NOTE — Telephone Encounter (Signed)
I have discussed patient's needs with [name] See, LCSW/THN and she will reach out to patient  if [facility] is able to provide any additional resources.

## 2021-10-16 ENCOUNTER — Telehealth: Payer: Self-pay | Admitting: Licensed Clinical Social Worker

## 2021-10-16 NOTE — Patient Outreach (Signed)
  Care Coordination   10/16/2021 Name: Christine Rocha MRN: [id number] DOB: 02/10/1955   Care Coordination Outreach Attempts:  A second unsuccessful outreach was attempted today to offer the patient with information about available care coordination services as a benefit of their health plan.     Follow Up Plan:  Additional outreach attempts will be made to offer the patient care coordination information and services.   Encounter Outcome:  No Answer  Care Coordination Interventions Activated:  No   Care Coordination Interventions:  No, not indicated    [name] See, MSW, [email or web address] Phone [phone] 3:54 PM

## 2021-10-20 ENCOUNTER — Ambulatory Visit: Payer: Medicare Other | Attending: Internal Medicine | Admitting: Pharmacist

## 2021-10-20 ENCOUNTER — Other Ambulatory Visit: Payer: Self-pay

## 2021-10-20 DIAGNOSIS — E785 Hyperlipidemia, unspecified: Secondary | ICD-10-CM | POA: Diagnosis not present

## 2021-10-20 DIAGNOSIS — E1165 Type 2 diabetes mellitus with hyperglycemia: Secondary | ICD-10-CM

## 2021-10-20 MED ORDER — ATENOLOL 50 MG PO TABS
50.0000 mg | ORAL_TABLET | Freq: Every day | ORAL | 0 refills | Status: DC
  Filled 2021-10-20 – 2021-11-04 (×3): qty 90, 90d supply, fill #0

## 2021-10-20 NOTE — Telephone Encounter (Signed)
I met with the patient when she was in the clinic today.  We reviewed the FNS application and made a list of documents/information  she needs to complete the application. She plans to take all of the documents to DSS tomorrow for assistance with completing the application.  She stated that she should have most, if not all, of the information about bank accounts, rent, utilities at home. She is concerned because she thinks her husband made too much money last year to qualify for food stamps. However, she explained that he is ill and may have to stop work at any point in time.   We reviewed the Advance Directives booklet in detail. She plans to  discuss the information further with her husband and brother before completing the documents and having them notarized. We spoke about a number of scenarios when a health care agent could be asked her wishes.  She was very appreciative of all of the information. She also  took 2 additional booklets - one for her husband and one for her brother.   I instructed her to call me if she has any questions.  Regarding her microwave, she said that she left messages for Ms Eulas Post at [facility] but has not heard back from her. I tried to reach Jones Bales at [employer] and had to leave a message requesting a call back.

## 2021-10-20 NOTE — Progress Notes (Signed)
    S:    PCP: Geryl Rankins, NP  Christine Rocha is a 66 y.o. female who presents for hypertension and diabetes evaluation, education, and management. PMH is significant for HTN, T2DM, osteoarthritis, and depression.   Patient was referred and last seen by Primary Care Provider, Geryl Rankins, on 04/22/21. Clinical pharmacy has seen her several times since then with her last visit occurring on 09/17/2021. A1c at that visit revealed significant improvement at 7.3% (down from 11.2%). BP was also well-controlled at 119/75 mmHg. We continued her current medications   Today, patient arrives in good spirits and presents without any assistance. She is doing well and brings her BP and CBG log in for review.   Family/Social History:  -Family hx: htn, dm -Tobacco: endorses smoking -Alcohol: denies  Current diabetes medications include: Trulicity 1.'5mg'$  once a week Current hypertension medications include: amlodipine '10mg'$  once daily, atenolol '50mg'$  once daily, lisinopril '20mg'$  once daily Current hyperlipidemia medications include: rosuvastatin '10mg'$  once daily  Patient reports adherence to taking all medications as prescribed. Patient took her BP medications this morning.   Insurance coverage: BCBS Medicare  Patient denies nocturia (nighttime urination). Patient reports neuropathy (nerve pain). Patient denies visual changes. Patient denies self foot exams.   Patient-reported exercise habits: walks her dog most mornings and evenings sometimes 1-2 hour walk   O:  Brings in home CBG log for review:  FPG range 89 - 142. 1 outlier of 166.  Post-prandial/random CBG range: 64 - 191. 1 outlier of 203.   Lab Results  Component Value Date   HGBA1C 7.3 (A) 09/17/2021   There were no vitals filed for this visit.  Brings her BP log in for review:  -SBP range: 109-132 mmHg -DBP range: 70-79 mmHg -HR: 72-90 bpm   Lipid Panel     Component Value Date/Time   CHOL 78 (L) 09/29/2020 1604   TRIG 134  09/29/2020 1604   HDL 32 (L) 09/29/2020 1604   CHOLHDL 2.4 09/29/2020 1604   CHOLHDL 3.8 02/25/2016 1737   VLDL 33 (H) 02/25/2016 1737   LDLCALC 22 09/29/2020 1604    A/P: Diabetes longstanding currently close to goal. Commended her for her progress. Patient is able to verbalize appropriate hypoglycemia management plan. Medication adherence appears appropriate.  -Continued GLP-1 Trulicity 1.'5mg'$  once a week -Extensively discussed pathophysiology of diabetes, recommended lifestyle interventions, dietary effects on blood sugar control.  -Counseled on s/sx of and management of hypoglycemia.  -Next A1c anticipated 12/2021.   ASCVD risk - primary prevention in patient with diabetes. Last LDL is at goal of <70 mg/dL. ASCVD risk factors include smoking status and DM. Will get lipid panel today.  -Continued rosuvastatin 10 mg.  -Lipid  Hypertension longstanding currently controlled. Blood pressure goal of <130/80  mmHg. Medication adherence appropriate. Could benefit from CMP at next visit. -Continued amlodipine '10mg'$  once daily -Continued atenolol '50mg'$  once daily -Continued lisinopril '20mg'$  once daily  Written patient instructions provided. Patient verbalized understanding of treatment plan.  Total time in face to face counseling 20 minutes.    Follow-up:  W/ PCP.   Benard Halsted, PharmD, [place], [place] [phone]

## 2021-10-21 ENCOUNTER — Telehealth: Payer: Self-pay

## 2021-10-21 LAB — LIPID PANEL
Chol/HDL Ratio: 3.3 ratio (ref 0.0–4.4)
Cholesterol, Total: 116 mg/dL (ref 100–199)
HDL: 35 mg/dL — ABNORMAL LOW (ref >39)
LDL Chol Calc (NIH): 58 mg/dL (ref 0–99)
Triglycerides: 130 mg/dL (ref 0–149)
VLDL Cholesterol Cal: 23 mg/dL (ref 5–40)

## 2021-10-21 NOTE — Telephone Encounter (Signed)
I received a call from Christine Rocha who said she has not received any messages from the patient.  I explained the patient's need for a microwave, oven and refrigerator.  She said that they do not have microwaves but have  ovens and refrigerators. When they receive microwaves, they are gone right away.  Christine Rocha said she would reach out to the patient and see how they can help her. I asked her to call me back if she is not able to reach the patient.

## 2021-10-22 NOTE — Patient Outreach (Signed)
  Care Coordination   Consult  Visit Note   10/22/2021 Name: Christine Rocha MRN: [id number] DOB: [birth year]  Christine Rocha is a 66 y.o. year old female who sees Gildardo Pounds, NP for primary care. I  spoke with Eden Lathe, RNCM  What matters to the patients health and wellness today?  Pt was not engaged during this encounter   LCSW was informed of pt's request for assistance with advanced directives. Pt has identified housing and food insecurity needs. LCSW will attempt to provide care coordination services  SDOH assessments and interventions completed:  No     Care Coordination Interventions Activated:  Yes  Care Coordination Interventions:  Yes, provided   Follow up plan: Follow up call scheduled for 1-2 weeks    Encounter Outcome:  Pt. Visit Completed   [name] See, MSW, [email or web address] Phone [phone] 6:22 PM

## 2021-10-23 ENCOUNTER — Telehealth: Payer: Self-pay | Admitting: Licensed Clinical Social Worker

## 2021-10-23 NOTE — Patient Outreach (Signed)
  Care Coordination   Initial Visit Note   10/23/2021 Name: Christine Rocha MRN: [id number] DOB: 11/17/1955  Christine Rocha is a 66 y.o. year old female who sees Christine Pounds, NP for primary care. I spoke with  Christine Rocha by phone today.  What matters to the patients health and wellness today?  Supportive Resources     Goals Addressed             This Visit's Progress    LCSW-Obtain Supportive Resources   On track    Care Coordination Interventions: Solution-Focused Strategies employed:  Active listening / Reflection utilized  Emotional Support Provided Collaborated with Christine Rocha, RNCM Patient reports that she needs a new oven and refrigerator LCSW informed pt that Christine Rocha spoke with Christine Rocha Rocha, who agreed to discuss resources through Christine Rocha. Pt was provided Christine Rocha's contact # and patient agreed to call early next week            SDOH assessments and interventions completed:  No     Care Coordination Interventions Activated:  Yes  Care Coordination Interventions:  Yes, provided   Follow up plan: Follow up call scheduled for 10/29/21    Encounter Outcome:  Pt. Visit Completed   Christine Rocha, MSW, [email or web address] Phone [phone] 3:33 PM

## 2021-10-23 NOTE — Patient Instructions (Signed)
Visit Information  Thank you for taking time to visit with me today. Please don't hesitate to contact me if I can be of assistance to you.   Following are the goals we discussed today:   Goals Addressed             This Visit's Progress    LCSW-Obtain Supportive Resources   On track    Care Coordination Interventions: Solution-Focused Strategies employed:  Active listening / Reflection utilized  Emotional Support Provided Collaborated with Eden Lathe, RNCM Patient reports that she needs a new oven and refrigerator LCSW informed pt that Eden Lathe spoke with Ms. Jones Bales, who agreed to discuss resources through [employer]. Pt was provided Ms. Carter's contact # and patient agreed to call early next week            Our next appointment is by telephone on 10/29/21 at 10 AM  Please call the care guide team at [phone] if you need to cancel or reschedule your appointment.   If you are experiencing a Mental Health or [place] or need someone to talk to, please call the Suicide and Crisis Lifeline: 988 call 911   Patient verbalizes understanding of instructions and care plan provided today and agrees to view in [place]. Active MyChart status and patient understanding of how to access instructions and care plan via MyChart confirmed with patient.     [name] See, MSW, [email or web address] Phone [phone] 3:33 PM

## 2021-10-23 NOTE — Patient Outreach (Deleted)
  Care Coordination   10/23/2021 Name: Christine Rocha MRN: [id number] DOB: 1955-02-14   Care Coordination Outreach Attempts:  A second unsuccessful outreach was attempted today to offer the patient with information about available care coordination services as a benefit of their health plan.     Follow Up Plan:  Additional outreach attempts will be made to offer the patient care coordination information and services.   Encounter Outcome:  No Answer  Care Coordination Interventions Activated:  No   Care Coordination Interventions:  No, not indicated    [name] See, MSW, [email or web address] Phone [phone] 2:58 PM

## 2021-10-29 ENCOUNTER — Telehealth: Payer: Self-pay | Admitting: Licensed Clinical Social Worker

## 2021-10-29 ENCOUNTER — Ambulatory Visit: Payer: Self-pay | Admitting: Licensed Clinical Social Worker

## 2021-10-29 NOTE — Telephone Encounter (Signed)
I called patient [phone] to inquire if she has heard from [facility]  Message left with call back requested.

## 2021-10-29 NOTE — Patient Outreach (Signed)
  Care Coordination   10/29/2021 Name: Christine Rocha MRN: [id number] DOB: 09/09/1955   Care Coordination Outreach Attempts:  An unsuccessful telephone outreach was attempted for a scheduled appointment today.  Follow Up Plan:  Additional outreach attempts will be made to offer the patient care coordination information and services.   Encounter Outcome:  No Answer  Care Coordination Interventions Activated:  No   Care Coordination Interventions:  No, not indicated    [name] See, MSW, [email or web address] Phone [phone] 10:10 AM

## 2021-10-30 ENCOUNTER — Telehealth: Payer: Self-pay | Admitting: Emergency Medicine

## 2021-10-30 NOTE — Telephone Encounter (Signed)
Copied from [place] [id number]. Topic: General - Other >> Oct 30, 2021  9:16 AM Everette C wrote: Reason for CRM: The patient has returned a missed cal from J. Brazeau   Please contact further when possible

## 2021-11-02 NOTE — Telephone Encounter (Signed)
Call returned to patient [phone], message left with call back requested.

## 2021-11-04 ENCOUNTER — Other Ambulatory Visit: Payer: Self-pay

## 2021-11-04 ENCOUNTER — Other Ambulatory Visit (HOSPITAL_COMMUNITY): Payer: Self-pay

## 2021-11-04 NOTE — Telephone Encounter (Signed)
I received a message from [facility] that the patient called. I tried to reach her again [phone] and left a message requesting a call back. I want to inquire if she has spoken to [facility] about home appliances.

## 2021-11-05 NOTE — Telephone Encounter (Signed)
Patient returned called back , she says everything is fine with [employer].

## 2021-11-06 ENCOUNTER — Other Ambulatory Visit: Payer: Self-pay

## 2021-11-06 MED FILL — Ciclopirox Solution 8%: CUTANEOUS | 30 days supply | Qty: 6.6 | Fill #1 | Status: AC

## 2021-11-10 NOTE — Patient Instructions (Signed)
Visit Information  Thank you for taking time to visit with me today. Please don't hesitate to contact me if I can be of assistance to you.   Following are the goals we discussed today:   Goals Addressed             This Visit's Progress    COMPLETED: LCSW-Obtain Supportive Resources       Care Coordination Interventions: Solution-Focused Strategies employed:  Active listening / Reflection utilized  Emotional Support Provided Verbalization of feelings encouraged  Pt reports that she did not reach out to [employer]. Pt's spouse cleaned the stove and oven and it began working. All eyes are working now Pt shared that her fridge was making noises because the ice maker wasn't plugged in. "It's working good now"  Pt's friend is going to donate a microwave to family. Pt expects to have it this week No new needs at this time. LCSW encouraged pt to contact LCSW if any needs arise            If you are experiencing a Mental Health or [place] or need someone to talk to, please call the Suicide and Crisis Lifeline: 988 call 911   Patient verbalizes understanding of instructions and care plan provided today and agrees to view in [place]. Active MyChart status and patient understanding of how to access instructions and care plan via MyChart confirmed with patient.     No further follow up required:    [name] See, MSW, [email or web address] Phone [phone] 9:35 AM

## 2021-11-10 NOTE — Patient Outreach (Signed)
  Care Coordination   Follow Up Visit Note   11/10/2021 Name: Christine Rocha MRN: [id number] DOB: November 30, 1955  Christine Rocha is a 66 y.o. year old female who sees Gildardo Pounds, NP for primary care. I spoke with  Christine Rocha by phone today.  What matters to the patients health and wellness today?  Furniture Needs    Goals Addressed             This Visit's Progress    COMPLETED: LCSW-Obtain Supportive Resources       Care Coordination Interventions: Solution-Focused Strategies employed:  Active listening / Reflection utilized  Emotional Support Provided Verbalization of feelings encouraged  Pt reports that she did not reach out to [employer]. Pt's spouse cleaned the stove and oven and it began working. All eyes are working now Pt shared that her fridge was making noises because the ice maker wasn't plugged in. "It's working good now"  Pt's friend is going to donate a microwave to family. Pt expects to have it this week No new needs at this time. LCSW encouraged pt to contact LCSW if any needs arise            SDOH assessments and interventions completed:  Yes  SDOH Interventions Today    Flowsheet Row Most Recent Value  SDOH Interventions   Housing Interventions Intervention Not Indicated  Transportation Interventions Intervention Not Indicated        Care Coordination Interventions Activated:  Yes  Care Coordination Interventions:  Yes, provided   Follow up plan: No further intervention required.   Encounter Outcome:  Pt. Visit Completed   [name] See, MSW, [email or web address] Phone [phone] 9:35 AM

## 2021-11-11 NOTE — Telephone Encounter (Signed)
I called the patient and she explained that she spoke to [facility] but prior to that, she and her husband cleaned the stove top and oven and they are both now working.  A friend repaired her refrigerator, the problem was with the ice maker and then a friend gave them a microwave.  She was pleased that all appliances are now working.

## 2021-11-16 ENCOUNTER — Encounter: Payer: Medicare Other | Attending: Nurse Practitioner | Admitting: Dietician

## 2021-11-16 ENCOUNTER — Encounter: Payer: Self-pay | Admitting: Dietician

## 2021-11-16 ENCOUNTER — Other Ambulatory Visit: Payer: Self-pay

## 2021-11-16 VITALS — Wt 183.0 lb

## 2021-11-16 DIAGNOSIS — E119 Type 2 diabetes mellitus without complications: Secondary | ICD-10-CM | POA: Insufficient documentation

## 2021-11-16 MED FILL — Lisinopril Tab 20 MG: ORAL | 90 days supply | Qty: 90 | Fill #1 | Status: AC

## 2021-11-16 NOTE — Progress Notes (Signed)
Diabetes Self-Management Education  Visit Type: Follow-up  Appt. Start Time: 0810 Appt. End Time: 0935  11/16/2021  [name], identified by name and date of birth, is a 66 y.o. female with a diagnosis of Diabetes:  .   ASSESSMENT Patient is here today alone and was last seen by this RD on 09/07/2021. She continues to walk twice per day (30-60 minutes daily) She is checking her blood glucose twice per day.  Fasting 96-125 with occasional 147 or 165 and 108-130 before dinner. She has more energy. States that she is sleeping more than usual. She has lost 3 lbs in the past 2 months.  History includes:  Type 2 Diabetes, HTN, arthritis, smokes Medications include:  Metformin (stopped), Lantus (stopped), Trulicity Labs noted:  Q9U 7.1% 11/06/2021 decreased from 11.2% 05/13/2021, 7.3% 02/09/2021, eGFR 65, Cholesterol 116, HDL 35, LDL 23, Triglycerides 130 10/20/2021   Weight: 183 lbs 11/16/2021 186 lbs 09/07/2021   182 lbs 06/16/2021 190 lbs 04/13/2021 (lost with uncontrolled glucose) 197 lbs 02/17/21 189 lbs 11/28/2020   Patient lives with her husband.  They share the shopping and cooking.  They eat out most of the time (fast food) but has changed to TV dinners most frequently since diabetes diagnosis. They rely on public transportation. She states that some vegetables (broccoli, cauliflower, and squash s) don't agree with her - make her want to vomit.  She enjoys cabbage and turnip greens.  Prefers canned food rather than frozen. She is on disability.  Walks with a cane or walker.   She smokes and has tried to quit without success. Food insecurity and relies on food banks to supplement.  Skips dinner a couple times a week due to money.  She was given the shopping at the [employer] handout on previous visit.  There were no vitals taken for this visit. There is no height or weight on file to calculate BMI.   Diabetes Self-Management Education - 11/16/21 0835       Visit Information    Visit Type Follow-up      Psychosocial Assessment   Patient Belief/Attitude about Diabetes Motivated to manage diabetes    What is the hardest part about your diabetes right now, causing you the most concern, or is the most worrisome to you about your diabetes?   Making healty food and beverage choices    Self-care barriers Low literacy    Self-management support Doctor's office;CDE visits    Other persons present Patient    Patient Concerns Nutrition/Meal planning    Special Needs None    Learning Readiness Ready      Pre-Education Assessment   Patient understands the diabetes disease and treatment process. Comprehends key points    Patient understands incorporating nutritional management into lifestyle. Needs Review    Patient undertands incorporating physical activity into lifestyle. Comprehends key points    Patient understands using medications safely. Comprehends key points    Patient understands monitoring blood glucose, interpreting and using results Comprehends key points    Patient understands prevention, detection, and treatment of acute complications. Comprehends key points    Patient understands prevention, detection, and treatment of chronic complications. Compreheands key points    Patient understands how to develop strategies to address psychosocial issues. Comprehends key points    Patient understands how to develop strategies to promote health/change behavior. Comprehends key points      Complications   Last HgB A1C per patient/outside source 7.1 %   11/06/2021 decreased from  11.2% 05/13/2021   How often do you check your blood sugar? 1-2 times/day    Fasting Blood glucose range (mg/dL) 70-129    Number of hypoglycemic episodes per month 0    Number of hyperglycemic episodes ( >237m/dL): Never      Dietary Intake   Breakfast bologna, egg, and cheese sandwich, grapes    Lunch Oodles of noodles    Dinner [employer] cheese sub and fries    Beverage(s) water,  diet soda, 1% lactose free milk      Activity / Exercise   Activity / Exercise Type Light (walking / raking leaves)    How many days per week do you exercise? 7    How many minutes per day do you exercise? 45    Total minutes per week of exercise 315      Patient Education   Previous Diabetes Education Yes (please comment)   08/2021   Healthy Eating Meal options for control of blood glucose level and chronic complications.    Being Active Role of exercise on diabetes management, blood pressure control and cardiac health.    Medications Reviewed patients medication for diabetes, action, purpose, timing of dose and side effects.    Monitoring Taught/discussed recording of test results and interpretation of SMBG.    Diabetes Stress and Support Worked with patient to identify barriers to care and solutions      Individualized Goals (developed by patient)   Nutrition General guidelines for healthy choices and portions discussed    Physical Activity Exercise 5-7 days per week;30 minutes per day    Medications take my medication as prescribed    Monitoring  Test my blood glucose as discussed    Problem Solving Eating Pattern    Reducing Risk examine blood glucose patterns      Patient Self-Evaluation of Goals - Patient rates self as meeting previously set goals (% of time)   Nutrition 25 - 50% (sometimes)    Physical Activity >75% (most of the time)    Medications >75% (most of the time)    Monitoring >75% (most of the time)    Problem Solving and behavior change strategies  50 - 75 % (half of the time)    Reducing Risk (treating acute and chronic complications) 50 - 75 % (half of the time)    Health Coping >75% (most of the time)      Post-Education Assessment   Patient understands the diabetes disease and treatment process. Demonstrates understanding / competency    Patient understands incorporating nutritional management into lifestyle. Comprehends key points    Patient undertands  incorporating physical activity into lifestyle. Demonstrates understanding / competency    Patient understands using medications safely. Demonstrates understanding / competency    Patient understands monitoring blood glucose, interpreting and using results Demonstrates understanding / competency    Patient understands prevention, detection, and treatment of acute complications. Demonstrates understanding / competency    Patient understands prevention, detection, and treatment of chronic complications. Demonstrates understanding / competency    Patient understands how to develop strategies to address psychosocial issues. Demonstrates understanding / competency    Patient understands how to develop strategies to promote health/change behavior. Comprehends key points      Outcomes   Expected Outcomes Demonstrated interest in learning. Expect positive outcomes    Future DMSE 3-4 months    Program Status Not Completed      Subsequent Visit   Since your last visit have you continued  or begun to take your medications as prescribed? Yes    Since your last visit have you experienced any weight changes? Loss    Weight Loss (lbs) 3    Since your last visit, are you checking your blood glucose at least once a day? Yes             Individualized Plan for Diabetes Self-Management Training:   Learning Objective:  Patient will have a greater understanding of diabetes self-management. Patient education plan is to attend individual and/or group sessions per assessed needs and concerns.   Plan:   Patient Instructions  Increase your vegetable and fruit intake Continue to check your blood glucose Continue to take your medication Continue to walk or do other exercise every day.  Expected Outcomes:  Demonstrated interest in learning. Expect positive outcomes  Education material provided:   If problems or questions, patient to contact team via:  Phone  Future DSME appointment: 3-4 months

## 2021-11-16 NOTE — Patient Instructions (Signed)
Increase your vegetable and fruit intake Continue to check your blood glucose Continue to take your medication Continue to walk or do other exercise every day.

## 2021-11-23 ENCOUNTER — Encounter: Payer: Self-pay | Admitting: Podiatry

## 2021-11-23 ENCOUNTER — Ambulatory Visit (INDEPENDENT_AMBULATORY_CARE_PROVIDER_SITE_OTHER): Payer: Medicare Other | Admitting: Podiatry

## 2021-11-23 DIAGNOSIS — M79676 Pain in unspecified toe(s): Secondary | ICD-10-CM | POA: Diagnosis not present

## 2021-11-23 DIAGNOSIS — B351 Tinea unguium: Secondary | ICD-10-CM

## 2021-11-23 DIAGNOSIS — E1142 Type 2 diabetes mellitus with diabetic polyneuropathy: Secondary | ICD-10-CM

## 2021-11-23 DIAGNOSIS — L84 Corns and callosities: Secondary | ICD-10-CM

## 2021-11-23 NOTE — Progress Notes (Signed)
  Subjective:  Patient ID: Christine Rocha, female    DOB: 1955-06-29,  MRN: [id number]  Christine Rocha presents to clinic today for at risk foot care with history of diabetic neuropathy and callus(es) b/l lower extremities and painful thick toenails that are difficult to trim. Painful toenails interfere with ambulation. Aggravating factors include wearing enclosed shoe gear. Pain is relieved with periodic professional debridement. Painful calluses are aggravated when weightbearing with and without shoegear. Pain is relieved with periodic professional debridement.  Chief Complaint  Patient presents with   Nail Problem    Diabetic foot care BS-Forgot what her reading was A1C-Do not know PCP- Geryl Rankins PCP [facility]- 10/2021    New problem(s): None.   PCP is Gildardo Pounds, NP.  Allergies  Allergen Reactions   Penicillins Nausea Only   Review of Systems: Negative except as noted in the HPI.  Objective: No changes noted in today's physical examination.  Christine Rocha is a pleasant 66 y.o. female WD, WN in NAD. AAO x 3.  Vascular Examination: CFT immediate b/l LE. Palpable DP/PT pulses b/l LE. Digital hair sparse b/l. Skin temperature gradient WNL b/l. No pain with calf compression b/l. No edema noted b/l. No cyanosis or clubbing noted b/l LE.  Dermatological Examination: Pedal integument with normal turgor, texture and tone BLE. No open wounds b/l LE. No interdigital macerations noted b/l LE.   Toenails 1-5 b/l elongated, discolored, dystrophic, thickened, crumbly with subungual debris and tenderness to dorsal palpation.   Hyperkeratotic lesion(s) bilateral great toes.  No erythema, no edema, no drainage, no fluctuance.  Musculoskeletal Examination: Muscle strength 5/5 to all lower extremity muscle groups bilaterally. No pain, crepitus or joint limitation noted with ROM bilateral LE. No gross bony deformities bilaterally. Utilizes rollator for ambulation assistance.  Neurological  Examination: Protective sensation decreased with 10 gram monofilament b/l. Vibratory sensation intact b/l.  Assessment/Plan: 1. Pain due to onychomycosis of toenail   2. Callus   3. Diabetic peripheral neuropathy associated with type 2 diabetes mellitus ([place])     No orders of the defined types were placed in this encounter.   -Patient was evaluated and treated. All patient's and/or POA's questions/concerns answered on today's visit. -No new findings. No new orders. -Mycotic toenails 1-5 bilaterally were debrided in length and girth with sterile nail nippers and dremel without incident. -Callus(es) medial IPJ of left great toe and medial IPJ of right great toe pared utilizing sterile scalpel blade without complication or incident. Total number debrided =2. -Patient/POA to call should there be question/concern in the interim.   Return in about 3 months (around 02/23/2022).  Marzetta Board, DPM

## 2021-11-30 ENCOUNTER — Other Ambulatory Visit: Payer: Self-pay | Admitting: Nurse Practitioner

## 2021-11-30 DIAGNOSIS — E1165 Type 2 diabetes mellitus with hyperglycemia: Secondary | ICD-10-CM

## 2021-12-09 ENCOUNTER — Ambulatory Visit: Payer: Medicare Other | Attending: Nurse Practitioner | Admitting: Nurse Practitioner

## 2021-12-09 ENCOUNTER — Other Ambulatory Visit: Payer: Self-pay

## 2021-12-09 ENCOUNTER — Encounter: Payer: Self-pay | Admitting: Nurse Practitioner

## 2021-12-09 VITALS — BP 142/79 | HR 77 | Temp 98.0°F | Ht 63.0 in | Wt 184.6 lb

## 2021-12-09 DIAGNOSIS — L209 Atopic dermatitis, unspecified: Secondary | ICD-10-CM | POA: Diagnosis not present

## 2021-12-09 DIAGNOSIS — Z23 Encounter for immunization: Secondary | ICD-10-CM

## 2021-12-09 DIAGNOSIS — E1165 Type 2 diabetes mellitus with hyperglycemia: Secondary | ICD-10-CM | POA: Diagnosis not present

## 2021-12-09 DIAGNOSIS — I10 Essential (primary) hypertension: Secondary | ICD-10-CM | POA: Diagnosis not present

## 2021-12-09 DIAGNOSIS — E785 Hyperlipidemia, unspecified: Secondary | ICD-10-CM

## 2021-12-09 MED ORDER — ATENOLOL 50 MG PO TABS
50.0000 mg | ORAL_TABLET | Freq: Every day | ORAL | 1 refills | Status: DC
  Filled 2021-12-09 – 2022-01-12 (×2): qty 90, 90d supply, fill #0
  Filled 2022-04-08: qty 90, 90d supply, fill #1

## 2021-12-09 MED ORDER — ROSUVASTATIN CALCIUM 10 MG PO TABS
10.0000 mg | ORAL_TABLET | Freq: Every day | ORAL | 1 refills | Status: DC
  Filled 2021-12-09 – 2022-01-12 (×2): qty 90, 90d supply, fill #0
  Filled 2022-04-08: qty 90, 90d supply, fill #1

## 2021-12-09 MED ORDER — LISINOPRIL 20 MG PO TABS
20.0000 mg | ORAL_TABLET | Freq: Every day | ORAL | 1 refills | Status: DC
  Filled 2021-12-09: qty 90, fill #0
  Filled 2022-01-12: qty 90, 90d supply, fill #0
  Filled 2022-04-08: qty 90, 90d supply, fill #1

## 2021-12-09 MED ORDER — AMLODIPINE BESYLATE 10 MG PO TABS
10.0000 mg | ORAL_TABLET | Freq: Every day | ORAL | 1 refills | Status: DC
  Filled 2021-12-09 – 2022-01-12 (×2): qty 90, 90d supply, fill #0
  Filled 2022-04-08: qty 90, 90d supply, fill #1

## 2021-12-09 MED ORDER — CONTOUR NEXT TEST VI STRP
ORAL_STRIP | 3 refills | Status: DC
  Filled 2021-12-09: qty 100, fill #0
  Filled 2022-01-21: qty 100, 33d supply, fill #0
  Filled 2022-04-01: qty 100, 33d supply, fill #1

## 2021-12-09 MED ORDER — TRULICITY 1.5 MG/0.5ML ~~LOC~~ SOAJ
1.5000 mg | SUBCUTANEOUS | 0 refills | Status: DC
  Filled 2021-12-09: qty 2, 28d supply, fill #0
  Filled 2021-12-10: qty 6, 84d supply, fill #0

## 2021-12-09 MED ORDER — CLOTRIMAZOLE-BETAMETHASONE 1-0.05 % EX CREA
1.0000 | TOPICAL_CREAM | Freq: Every day | CUTANEOUS | 0 refills | Status: DC
Start: 2021-12-09 — End: 2022-03-12
  Filled 2021-12-09: qty 30, 15d supply, fill #0

## 2021-12-09 MED ORDER — MICROLET LANCETS MISC
2 refills | Status: DC
  Filled 2021-12-09: qty 100, fill #0
  Filled 2022-01-21: qty 100, 25d supply, fill #0
  Filled 2022-04-01: qty 100, 33d supply, fill #1
  Filled 2022-07-29: qty 100, 33d supply, fill #2

## 2021-12-09 NOTE — Progress Notes (Signed)
Assessment & Plan:  Christine Rocha was seen today for hypertension.  Diagnoses and all orders for this visit:  Primary hypertension -     CMP14+EGFR -     amLODipine (NORVASC) 10 MG tablet; Take 1 tablet (10 mg total) by mouth daily. -     atenolol (TENORMIN) 50 MG tablet; Take 1 tablet (50 mg total) by mouth daily. -     lisinopril (ZESTRIL) 20 MG tablet; TAKE 1 TABLET(20 MG) BY MOUTH DAILY Continue all antihypertensives as prescribed.  Reminded to bring in blood pressure log for follow  up appointment.  RECOMMENDATIONS: DASH/Mediterranean Diets are healthier choices for HTN.    Type 2 diabetes mellitus with hyperglycemia, without long-term current use of insulin (HCC) -     Dulaglutide (TRULICITY) 1.5 DJ/2.4QA SOPN; Inject 1.5 mg into the skin once a week. -     Microlet Lancets MISC; Use to check blood sugar once daily. -     glucose blood (CONTOUR NEXT TEST) test strip; Use as directed to check blood sugar 3 times daily. Continue blood sugar control as discussed in office today, low carbohydrate diet, and regular physical exercise as tolerated, 150 minutes per week (30 min each day, 5 days per week, or 50 min 3 days per week). Keep blood sugar logs with fasting goal of 90-130 mg/dl, post prandial (after you eat) less than 180.  For Hypoglycemia: BS <60 and Hyperglycemia BS >400; contact the clinic ASAP. Annual eye exams and foot exams are recommended.   Atopic dermatitis, unspecified type -     clotrimazole-betamethasone (LOTRISONE) cream; Apply 1 Application topically daily. Apply to right forearm  Need for influenza vaccination -     Flu Vaccine MDCK QUAD PF  Dyslipidemia, goal LDL below 70 -     rosuvastatin (CRESTOR) 10 MG tablet; Take 1 tablet (10 mg total) by mouth daily. To lower cholesterol    Patient has been counseled on age-appropriate routine health concerns for screening and prevention. These are reviewed and up-to-date. Referrals have been placed accordingly.  Immunizations are up-to-date or declined.    Subjective:   Chief Complaint  Patient presents with   Hypertension   HPI Christine Rocha 66 y.o. female presents to office today for follow up to HTN  She has a past medical history of arthritis, DM2, HPL, HTN and seizures.   Patient has been counseled on age-appropriate routine health concerns for screening and prevention. These are reviewed and up-to-date. Referrals have been placed accordingly. Immunizations are up-to-date or declined.     Mammogram: UTD.  PAP SMEAR: UTD COLONSCOPY: UTD   Eczema: Patient complains of rash. Onset of symptoms several days ago, and have been unchanged since that time.  Risk factors include  smoking. Treatment modalities that have been used in the past include: she has been applying alcohol to the area.     HTN Blood pressure is slightly elevated today.  She does endorse increased stress as husband has been diagnosed with a autoimmune disorder.  She is taking amlodipine 10 mg daily, lisinopril 20 mg daily as prescribed.  Has cut back on smoking from half a pack a day to 6 cigarettes a day. BP Readings from Last 3 Encounters:  12/09/21 (!) 142/79  09/17/21 119/75  08/10/21 113/68       Review of Systems  Constitutional:  Negative for fever, malaise/fatigue and weight loss.  HENT: Negative.  Negative for nosebleeds.   Eyes: Negative.  Negative for blurred vision, double vision and  photophobia.  Respiratory: Negative.  Negative for cough and shortness of breath.   Cardiovascular: Negative.  Negative for chest pain, palpitations and leg swelling.  Gastrointestinal: Negative.  Negative for heartburn, nausea and vomiting.  Musculoskeletal: Negative.  Negative for myalgias.  Skin:  Positive for itching and rash.  Neurological: Negative.  Negative for dizziness, focal weakness, seizures and headaches.  Psychiatric/Behavioral: Negative.  Negative for suicidal ideas.     Past Medical History:  Diagnosis  Date   Arthritis    Right Knee   Diabetes mellitus without complication ([place])    Hypertension    Seizures ([place])     Past Surgical History:  Procedure Laterality Date   COLONOSCOPY  2021   ORIF FEMUR FRACTURE Left 04/24/2017   Procedure: OPEN REDUCTION INTERNAL FIXATION (ORIF) DISTAL FEMUR FRACTURE;  Surgeon: [name], MD;  Location: [facility];  Service: Orthopedics;  Laterality: Left;    Family History  Problem Relation Age of Onset   Hypertension Mother    Breast cancer Mother    Hypertension Sister    Diabetes Sister     Social History Reviewed with no changes to be made today.   Outpatient Medications Prior to Visit  Medication Sig Dispense Refill   Blood Glucose Monitoring Suppl (CONTOUR NEXT EZ) w/Device KIT Use to check blood sugar once daily. 1 kit 0   ciclopirox (PENLAC) 8 % solution APPLY ONE COAT TO AFFECTED TONAILS EVERY DAY. REMOVE WEEKLY WITH RUBBING ALCOHOL. 6.6 mL 11   hydrocortisone (ANUSOL-HC) 2.5 % rectal cream Place 1 application. rectally 2 (two) times daily. 60 g 1   Multiple Vitamins-Minerals (WOMENS 50+ MULTI VITAMIN/MIN) TABS Take 1 tablet by mouth daily.     nystatin-triamcinolone ointment (MYCOLOG) Apply 1 application. topically 2 (two) times daily. As needed under breasts and under stomach 100 g 0   polyethylene glycol (MIRALAX / GLYCOLAX) 17 g packet Take 17 g by mouth daily as needed. Mixed into 16 or more ounces of fluid 30 each 6   amLODipine (NORVASC) 10 MG tablet Take 1 tablet (10 mg total) by mouth daily. 90 tablet 1   atenolol (TENORMIN) 50 MG tablet Take 1 tablet (50 mg total) by mouth daily. 90 tablet 0   Dulaglutide (TRULICITY) 1.5 PJ/0.9TO SOPN INJECT 1.5 MG (0.5ML) UNDER THE SKIN ONCE A WEEK 6 mL 0   glucose blood (CONTOUR NEXT TEST) test strip Use as directed to check blood sugar 3 times daily. 100 strip 3   lisinopril (ZESTRIL) 20 MG tablet TAKE 1 TABLET(20 MG) BY MOUTH DAILY 90 tablet 1   Microlet Lancets MISC Use to check blood sugar  once daily. 100 each 2   rosuvastatin (CRESTOR) 10 MG tablet Take 1 tablet (10 mg total) by mouth daily. To lower cholesterol 90 tablet 1   No facility-administered medications prior to visit.    Allergies  Allergen Reactions   Penicillins Nausea Only       Objective:    BP (!) 142/79 Comment: 129/80 per patient  Pulse 77   Temp 98 F (36.7 C) (Temporal)   Ht _0  (1.6 m)   Wt 184 lb 9.6 oz (83.7 kg)   SpO2 96%   BMI 32.70 kg/m  Wt Readings from Last 3 Encounters:  12/09/21 184 lb 9.6 oz (83.7 kg)  11/16/21 183 lb (83 kg)  09/07/21 186 lb (84.4 kg)    Physical Exam Vitals and nursing note reviewed.  Constitutional:      Appearance: She is well-developed.  HENT:     Head: Normocephalic and atraumatic.  Cardiovascular:     Rate and Rhythm: Normal rate and regular rhythm.     Heart sounds: Normal heart sounds. No murmur heard.    No friction rub. No gallop.  Pulmonary:     Effort: Pulmonary effort is normal. No tachypnea or respiratory distress.     Breath sounds: Normal breath sounds. No decreased breath sounds, wheezing, rhonchi or rales.  Chest:     Chest wall: No tenderness.  Abdominal:     General: Bowel sounds are normal.     Palpations: Abdomen is soft.  Musculoskeletal:        General: Normal range of motion.     Cervical back: Normal range of motion.  Skin:    General: Skin is warm and dry.       Neurological:     Mental Status: She is alert and oriented to person, place, and time.     Coordination: Coordination normal.  Psychiatric:        Behavior: Behavior normal. Behavior is cooperative.        Thought Content: Thought content normal.        Judgment: Judgment normal.          Patient has been counseled extensively about nutrition and exercise as well as the importance of adherence with medications and regular follow-up. The patient was given clear instructions to go to ER or return to medical center if symptoms don't improve, worsen or  new problems develop. The patient verbalized understanding.   Follow-up: Return in about 3 months (around 03/11/2022).   Gildardo Pounds, FNP-BC [facility], [place]   12/09/2021, 12:27 PM

## 2021-12-09 NOTE — Progress Notes (Signed)
Small rash on right forearm.

## 2021-12-10 ENCOUNTER — Other Ambulatory Visit: Payer: Self-pay

## 2021-12-10 LAB — CMP14+EGFR
ALT: 16 IU/L (ref 0–32)
AST: 15 IU/L (ref 0–40)
Albumin/Globulin Ratio: 2 (ref 1.2–2.2)
Albumin: 4.6 g/dL (ref 3.9–4.9)
Alkaline Phosphatase: 81 IU/L (ref 44–121)
BUN/Creatinine Ratio: 13 (ref 12–28)
BUN: 11 mg/dL (ref 8–27)
Bilirubin Total: 0.2 mg/dL (ref 0.0–1.2)
CO2: 22 mmol/L (ref 20–29)
Calcium: 9.7 mg/dL (ref 8.7–10.3)
Chloride: 104 mmol/L (ref 96–106)
Creatinine, Ser: 0.84 mg/dL (ref 0.57–1.00)
Globulin, Total: 2.3 g/dL (ref 1.5–4.5)
Glucose: 133 mg/dL — ABNORMAL HIGH (ref 70–99)
Potassium: 3.8 mmol/L (ref 3.5–5.2)
Sodium: 142 mmol/L (ref 134–144)
Total Protein: 6.9 g/dL (ref 6.0–8.5)
eGFR: 78 mL/min/{1.73_m2} (ref >59)

## 2021-12-18 ENCOUNTER — Other Ambulatory Visit: Payer: Self-pay | Admitting: Nurse Practitioner

## 2021-12-18 DIAGNOSIS — Z1231 Encounter for screening mammogram for malignant neoplasm of breast: Secondary | ICD-10-CM

## 2021-12-23 ENCOUNTER — Other Ambulatory Visit: Payer: Self-pay

## 2021-12-23 MED FILL — Ciclopirox Solution 8%: CUTANEOUS | 30 days supply | Qty: 6.6 | Fill #2 | Status: AC

## 2022-01-12 ENCOUNTER — Other Ambulatory Visit: Payer: Self-pay

## 2022-01-21 ENCOUNTER — Other Ambulatory Visit: Payer: Self-pay

## 2022-02-12 ENCOUNTER — Ambulatory Visit
Admission: RE | Admit: 2022-02-12 | Discharge: 2022-02-12 | Disposition: A | Discharge status: Home / Self Care | Payer: Medicare Other | Source: Ambulatory Visit | Attending: Nurse Practitioner | Admitting: Nurse Practitioner

## 2022-02-12 DIAGNOSIS — Z1231 Encounter for screening mammogram for malignant neoplasm of breast: Secondary | ICD-10-CM | POA: Diagnosis not present

## 2022-02-16 ENCOUNTER — Other Ambulatory Visit: Payer: Self-pay

## 2022-02-16 ENCOUNTER — Other Ambulatory Visit: Payer: Self-pay | Admitting: Podiatry

## 2022-02-16 ENCOUNTER — Encounter: Payer: Medicare Other | Attending: Nurse Practitioner | Admitting: Dietician

## 2022-02-16 ENCOUNTER — Encounter: Payer: Self-pay | Admitting: Dietician

## 2022-02-16 DIAGNOSIS — E1165 Type 2 diabetes mellitus with hyperglycemia: Secondary | ICD-10-CM | POA: Insufficient documentation

## 2022-02-16 DIAGNOSIS — B351 Tinea unguium: Secondary | ICD-10-CM

## 2022-02-16 MED ORDER — CICLOPIROX 8 % EX SOLN
CUTANEOUS | 0 refills | Status: DC
  Filled 2022-02-16: qty 6.6, 30d supply, fill #0

## 2022-02-16 NOTE — Progress Notes (Signed)
Diabetes Self-Management Education  Visit Type: Follow-up  Appt. Start Time: 27 (patient was late) Appt. End Time: 0932  02/16/2022  [name], identified by name and date of birth, is a 67 y.o. female with a diagnosis of Diabetes:  .   ASSESSMENT Patient is here today alone.  She was last seen by this RD on 11/16/2021.  She states that there are a lot of problems. She has not been able to sleep well.  States that she has been sleeping more during the day. Feels sick. (Chest congestion and stuffy nose) Poor appetite. Increased sickness and death in family/friends. Food insecurity.  She states that she has continued to take her medication. Testing her blood glucose twice daily.  Fasting 97-121 but a few over 200 due to eating a lot of candy.  After supper - 87-158, occasional over 200 due to candy.  History includes:  Type 2 Diabetes, HTN, arthritis, smokes Medications include:  Metformin (stopped), Lantus (stopped), Trulicity Labs noted:  I7T 7.1% 11/06/2021 decreased from 11.2% 05/13/2021, 7.3% 02/09/2021, eGFR 65, Cholesterol 116, HDL 35, LDL 23, Triglycerides 130 10/20/2021   Weight: 183 lbs 11/16/2021 186 lbs 09/07/2021   182 lbs 06/16/2021 190 lbs 04/13/2021 (lost with uncontrolled glucose) 197 lbs 02/17/21 189 lbs 11/28/2020   Patient lives with her husband.  They share the shopping and cooking.  They eat out most of the time (fast food) but has changed to TV dinners most frequently since diabetes diagnosis. They rely on public transportation. She states that some vegetables (broccoli, cauliflower, and squash s) don't agree with her - make her want to vomit.  She enjoys cabbage and turnip greens.  Prefers canned food rather than frozen. She is on disability.  Walks with a cane or walker.   She smokes and has tried to quit without success. Food insecurity and relies on food banks to supplement.  Skips dinner a couple times a week due to money.  She was given the shopping at  the [employer] handout on previous visit. Food stamps but not enough to last ($23 per month).  She does use a food pantry.    Diabetes Self-Management Education - 02/16/22 1642       Visit Information   Visit Type Follow-up      Psychosocial Assessment   Self-management support Doctor's office    Other persons present Patient    Patient Concerns Nutrition/Meal planning;Problem Solving    Special Needs Simplified materials    Preferred Learning Style No preference indicated    Learning Readiness Ready      Pre-Education Assessment   Patient understands the diabetes disease and treatment process. Comprehends key points    Patient understands incorporating nutritional management into lifestyle. Needs Review    Patient undertands incorporating physical activity into lifestyle. Comprehends key points    Patient understands using medications safely. Comprehends key points    Patient understands monitoring blood glucose, interpreting and using results Comprehends key points    Patient understands prevention, detection, and treatment of acute complications. Comprehends key points    Patient understands prevention, detection, and treatment of chronic complications. Compreheands key points    Patient understands how to develop strategies to address psychosocial issues. Comprehends key points    Patient understands how to develop strategies to promote health/change behavior. Comprehends key points      Dietary Intake   Breakfast 2 boiled eggs OR sausage and eggs, bread OR egg and bologna sandwich    Lunch skips  Dinner tuna sandwich    Snack (evening) occasional candy    Beverage(s) water, diet Pepsi, diet Mt. Dew      Patient Education   Previous Diabetes Education Yes (please comment)   11/2021   Healthy Eating Meal options for control of blood glucose level and chronic complications.    Medications Reviewed patients medication for diabetes, action, purpose, timing of dose and side  effects.    Monitoring Taught/evaluated SMBG meter.    Diabetes Stress and Support Identified and addressed patients feelings and concerns about diabetes;Worked with patient to identify barriers to care and solutions      Individualized Goals (developed by patient)   Nutrition General guidelines for healthy choices and portions discussed    Physical Activity Exercise 5-7 days per week;30 minutes per day    Medications take my medication as prescribed    Monitoring  Test my blood glucose as discussed    Problem Solving Eating Pattern    Reducing Risk examine blood glucose patterns;stop smoking;do foot checks daily      Patient Self-Evaluation of Goals - Patient rates self as meeting previously set goals (% of time)   Nutrition 25 - 50% (sometimes)    Physical Activity >75% (most of the time)    Medications >75% (most of the time)    Monitoring >75% (most of the time)    Problem Solving and behavior change strategies  50 - 75 % (half of the time)    Reducing Risk (treating acute and chronic complications) 50 - 75 % (half of the time)    Health Coping >75% (most of the time)      Post-Education Assessment   Patient understands the diabetes disease and treatment process. Demonstrates understanding / competency    Patient understands incorporating nutritional management into lifestyle. Comprehends key points    Patient undertands incorporating physical activity into lifestyle. Demonstrates understanding / competency    Patient understands using medications safely. Demonstrates understanding / competency    Patient understands monitoring blood glucose, interpreting and using results Demonstrates understanding / competency    Patient understands prevention, detection, and treatment of acute complications. Demonstrates understanding / competency    Patient understands prevention, detection, and treatment of chronic complications. Demonstrates understanding / competency    Patient understands how  to develop strategies to address psychosocial issues. Demonstrates understanding / competency    Patient understands how to develop strategies to promote health/change behavior. Comprehends key points      Outcomes   Expected Outcomes Demonstrated interest in learning. Expect positive outcomes    Future DMSE 2 months    Program Status Not Completed      Subsequent Visit   Since your last visit have you continued or begun to take your medications as prescribed? Yes             Individualized Plan for Diabetes Self-Management Training:   Learning Objective:  Patient will have a greater understanding of diabetes self-management. Patient education plan is to attend individual and/or group sessions per assessed needs and concerns.   Plan:   Patient Instructions  Great job on reducing your smoking.  Continue to work on it!  To help improve your sleep during the night:  Avoid sleeping during the day Avoid caffeine after noon daily. Drink water rather diet Pepsi and diet Mt. Dew most often.  Aim to have balanced meals  Breakfast:  Oatmeal, boiled egg, fruit  Lunch:  sandwich, fruit  Dinner:  pinto beans, 1/2 cup  rice, vegetables  Avoid Candy  Continue to take your medication as prescribed. Continue to check your blood glucose daily.  Expected Outcomes:  Demonstrated interest in learning. Expect positive outcomes  Education material provided:   If problems or questions, patient to contact team via:  Phone  Future DSME appointment: 2 months

## 2022-02-16 NOTE — Patient Instructions (Addendum)
Great job on reducing your smoking.  Continue to work on it!  To help improve your sleep during the night:  Avoid sleeping during the day Avoid caffeine after noon daily. Drink water rather diet Pepsi and diet Mt. Dew most often.  Aim to have balanced meals  Breakfast:  Oatmeal, boiled egg, fruit  Lunch:  sandwich, fruit  Dinner:  pinto beans, 1/2 cup rice, vegetables  Avoid Candy  Continue to take your medication as prescribed. Continue to check your blood glucose daily.

## 2022-02-16 NOTE — Telephone Encounter (Signed)
Patient is calling to request  for ciclopirox 8 % solution , had 11 refills but they expired, patient was seen 11/23,upcoming appointment scheduled 03/15/22,please advise.

## 2022-02-17 ENCOUNTER — Other Ambulatory Visit: Payer: Self-pay

## 2022-02-19 ENCOUNTER — Other Ambulatory Visit: Payer: Self-pay

## 2022-03-12 ENCOUNTER — Other Ambulatory Visit: Payer: Self-pay

## 2022-03-12 ENCOUNTER — Ambulatory Visit: Payer: Medicare Other | Attending: Nurse Practitioner | Admitting: Nurse Practitioner

## 2022-03-12 ENCOUNTER — Encounter: Payer: Self-pay | Admitting: Nurse Practitioner

## 2022-03-12 VITALS — BP 124/74 | HR 83 | Ht 63.0 in | Wt 191.6 lb

## 2022-03-12 DIAGNOSIS — Z794 Long term (current) use of insulin: Secondary | ICD-10-CM

## 2022-03-12 DIAGNOSIS — E1142 Type 2 diabetes mellitus with diabetic polyneuropathy: Secondary | ICD-10-CM | POA: Diagnosis not present

## 2022-03-12 DIAGNOSIS — F5101 Primary insomnia: Secondary | ICD-10-CM | POA: Diagnosis not present

## 2022-03-12 DIAGNOSIS — I10 Essential (primary) hypertension: Secondary | ICD-10-CM | POA: Diagnosis not present

## 2022-03-12 DIAGNOSIS — F322 Major depressive disorder, single episode, severe without psychotic features: Secondary | ICD-10-CM

## 2022-03-12 DIAGNOSIS — Z23 Encounter for immunization: Secondary | ICD-10-CM

## 2022-03-12 DIAGNOSIS — Z862 Personal history of diseases of the blood and blood-forming organs and certain disorders involving the immune mechanism: Secondary | ICD-10-CM | POA: Diagnosis not present

## 2022-03-12 DIAGNOSIS — E1165 Type 2 diabetes mellitus with hyperglycemia: Secondary | ICD-10-CM | POA: Diagnosis not present

## 2022-03-12 LAB — POCT GLYCOSYLATED HEMOGLOBIN (HGB A1C): HbA1c, POC (controlled diabetic range): 7 % (ref 0.0–7.0)

## 2022-03-12 MED ORDER — TRULICITY 1.5 MG/0.5ML ~~LOC~~ SOAJ
1.5000 mg | SUBCUTANEOUS | 1 refills | Status: DC
  Filled 2022-03-12: qty 6, 84d supply, fill #0
  Filled 2022-03-23 – 2022-05-17 (×3): qty 2, 28d supply, fill #0

## 2022-03-12 MED ORDER — TRAZODONE HCL 50 MG PO TABS
25.0000 mg | ORAL_TABLET | Freq: Every evening | ORAL | 3 refills | Status: DC | PRN
  Filled 2022-03-12: qty 30, 30d supply, fill #0

## 2022-03-12 MED ORDER — ZOSTER VAC RECOMB ADJUVANTED 50 MCG/0.5ML IM SUSR
0.5000 mL | Freq: Once | INTRAMUSCULAR | 0 refills | Status: AC
  Filled 2022-03-12 (×2): qty 0.5, 1d supply, fill #0

## 2022-03-12 NOTE — Progress Notes (Signed)
Assessment & Plan:  Christine Rocha was seen today for diabetes and hypertension.  Diagnoses and all orders for this visit:  Primary hypertension -     CMP14+EGFR  Type 2 diabetes mellitus with hyperglycemia, without long-term current use of insulin (HCC) -     POCT glycosylated hemoglobin (Hb A1C) -     CMP14+EGFR -     Microalbumin / creatinine urine ratio -     Ambulatory referral to Ophthalmology -     Dulaglutide (TRULICITY) 1.5 0000000 SOPN; Inject 1.5 mg into the skin once a week. NEEDS PRIOR AUTH Continue blood sugar control as discussed in office today, low carbohydrate diet, and regular physical exercise as tolerated, 150 minutes per week (30 min each day, 5 days per week, or 50 min 3 days per week). Keep blood sugar logs with fasting goal of 90-130 mg/dl, post prandial (after you eat) less than 180.  For Hypoglycemia: BS <60 and Hyperglycemia BS >400; contact the clinic ASAP. Annual eye exams and foot exams are recommended.   Diabetic peripheral neuropathy associated with type 2 diabetes mellitus (HCC) Well controlled at this time.   Current severe episode of major depressive disorder without psychotic features, unspecified whether recurrent ([place]) Well controlled.   History of anemia -     CBC with Differential  Need for shingles vaccine -     Zoster Vaccine Adjuvanted [facility]) injection; Inject 0.5 mLs into the muscle once for 1 dose.    Patient has been counseled on age-appropriate routine health concerns for screening and prevention. These are reviewed and up-to-date. Referrals have been placed accordingly. Immunizations are up-to-date or declined.    Subjective:   Chief Complaint  Patient presents with   Diabetes   Hypertension    [name] 67 y.o. female presents to office today for follow-up to diabetes and hypertension.  She has a past medical history of arthritis, DM2, HPL, HTN and seizures.    Patient has been counseled on age-appropriate routine  health concerns for screening and prevention. These are reviewed and up-to-date. Referrals have been placed accordingly. Immunizations are up-to-date or declined.     Mammogram: UTD.  PAP SMEAR: UTD COLONSCOPY: UTD   HTN Blood pressure is well controlled today.  She is taking amlodipine 10 mg daily, atenolol 50 mg daily, lisinopril 20 mg daily as prescribed.  Has cut back on smoking from half a pack a day to 6 cigarettes a day. BP Readings from Last 3 Encounters:  03/12/22 124/74  12/09/21 (!) 142/79  09/17/21 119/75     DM 2 Well controlled. She is currently prescribed Trulicity 1.5 mg weekly. She stopped taking metformin last year as it was causing GI upset. She is checking her blood sugars twice per day. Lab Results  Component   HGBA1C 7.0                           03-12-2022    Lab Results  Component Value Date   HGBA1C 7.3 (A) 09/17/2021  LDL currently at goal. Lab Results  Component Value Date   LDLCALC 58 10/20/2021     Insomnia Difficulty falling and staying asleep. Lots of stress with being caregiver for husband who has an autoimmune disorder.  PHQ 9 elevated. Denies any current thoughts of self harm.   Review of Systems  Constitutional:  Negative for fever, malaise/fatigue and weight loss.  HENT: Negative.  Negative for nosebleeds.   Eyes: Negative.  Negative for blurred vision, double vision and photophobia.  Respiratory: Negative.  Negative for cough and shortness of breath.   Cardiovascular: Negative.  Negative for chest pain, palpitations and leg swelling.  Gastrointestinal: Negative.  Negative for heartburn, nausea and vomiting.  Musculoskeletal: Negative.  Negative for myalgias.  Neurological: Negative.  Negative for dizziness, focal weakness, seizures and headaches.  Psychiatric/Behavioral:  Negative for suicidal ideas. The patient has insomnia.     Past Medical History:  Diagnosis Date   Arthritis    Right Knee   Diabetes mellitus without complication  ([place])    Hypertension    Seizures ([place])     Past Surgical History:  Procedure Laterality Date   COLONOSCOPY  2021   ORIF FEMUR FRACTURE Left 04/24/2017   Procedure: OPEN REDUCTION INTERNAL FIXATION (ORIF) DISTAL FEMUR FRACTURE;  Surgeon: [name], MD;  Location: [facility];  Service: Orthopedics;  Laterality: Left;    Family History  Problem Relation Age of Onset   Hypertension Mother    Breast cancer Mother    Hypertension Sister    Diabetes Sister     Social History Reviewed with no changes to be made today.   Outpatient Medications Prior to Visit  Medication Sig Dispense Refill   amLODipine (NORVASC) 10 MG tablet Take 1 tablet (10 mg total) by mouth daily. 90 tablet 1   atenolol (TENORMIN) 50 MG tablet Take 1 tablet (50 mg total) by mouth daily. 90 tablet 1   Blood Glucose Monitoring Suppl (CONTOUR NEXT EZ) w/Device KIT Use to check blood sugar once daily. 1 kit 0   glucose blood (CONTOUR NEXT TEST) test strip Use as directed to check blood sugar 3 times daily. 100 strip 3   hydrocortisone (ANUSOL-HC) 2.5 % rectal cream Place 1 application. rectally 2 (two) times daily. 60 g 1   lisinopril (ZESTRIL) 20 MG tablet Take 1 tablet (20 mg total) by mouth daily. 90 tablet 1   Microlet Lancets MISC Use to check blood sugar once daily. 100 each 2   Multiple Vitamins-Minerals (WOMENS 50+ MULTI VITAMIN/MIN) TABS Take 1 tablet by mouth daily.     nystatin-triamcinolone ointment (MYCOLOG) Apply 1 application. topically 2 (two) times daily. As needed under breasts and under stomach 100 g 0   rosuvastatin (CRESTOR) 10 MG tablet Take 1 tablet (10 mg total) by mouth daily. To lower cholesterol 90 tablet 1   clotrimazole-betamethasone (LOTRISONE) cream Apply 1 Application topically daily. Apply to right forearm 30 g 0   Dulaglutide (TRULICITY) 1.5 0000000 SOPN Inject 1.5 mg into the skin once a week. 6 mL 0   ciclopirox (PENLAC) 8 % solution APPLY ONE COAT TO AFFECTED TONAILS EVERY DAY. REMOVE  WEEKLY WITH RUBBING ALCOHOL. (Patient not taking: Reported on 03/12/2022) 6.6 mL 0   polyethylene glycol (MIRALAX / GLYCOLAX) 17 g packet Take 17 g by mouth daily as needed. Mixed into 16 or more ounces of fluid (Patient not taking: Reported on 03/12/2022) 30 each 6   No facility-administered medications prior to visit.    Allergies  Allergen Reactions   Penicillins Nausea Only       Objective:    BP 124/74   Pulse 83   Ht '5\' 3"'$  (1.6 m)   Wt 191 lb 9.6 oz (86.9 kg)   SpO2 98%   BMI 33.94 kg/m  Wt Readings from Last 3 Encounters:  03/12/22 191 lb 9.6 oz (86.9 kg)  12/09/21 184 lb 9.6 oz (83.7 kg)  11/16/21 183 lb (83  kg)    Physical Exam Vitals and nursing note reviewed.  Constitutional:      Appearance: She is well-developed.  HENT:     Head: Normocephalic and atraumatic.  Cardiovascular:     Rate and Rhythm: Normal rate and regular rhythm.     Heart sounds: Normal heart sounds. No murmur heard.    No friction rub. No gallop.  Pulmonary:     Effort: Pulmonary effort is normal. No tachypnea or respiratory distress.     Breath sounds: Normal breath sounds. No decreased breath sounds, wheezing, rhonchi or rales.  Chest:     Chest wall: No tenderness.  Abdominal:     General: Bowel sounds are normal.     Palpations: Abdomen is soft.  Musculoskeletal:        General: Normal range of motion.     Cervical back: Normal range of motion.  Skin:    General: Skin is warm and dry.  Neurological:     Mental Status: She is alert and oriented to person, place, and time.     Coordination: Coordination normal.     Comments: Uses cane to assist with mobility  Psychiatric:        Behavior: Behavior normal. Behavior is cooperative.        Thought Content: Thought content normal.        Judgment: Judgment normal.          Patient has been counseled extensively about nutrition and exercise as well as the importance of adherence with medications and regular follow-up. The patient  was given clear instructions to go to ER or return to medical center if symptoms don't improve, worsen or new problems develop. The patient verbalized understanding.   Follow-up: Return in about 3 months (around 06/12/2022).   Gildardo Pounds, FNP-BC [facility], [place]   03/12/2022, 9:05 AM

## 2022-03-12 NOTE — Patient Instructions (Signed)
Medications over the counter for sleep  Melatonin Ashwaghanda (can take with melatonin if needed)   Sleepy time tea [place] tea   NO caffeine (coffee, tea or sodas in the evening)

## 2022-03-13 LAB — CMP14+EGFR
ALT: 14 IU/L (ref 0–32)
AST: 15 IU/L (ref 0–40)
Albumin/Globulin Ratio: 2.1 (ref 1.2–2.2)
Albumin: 4.6 g/dL (ref 3.9–4.9)
Alkaline Phosphatase: 78 IU/L (ref 44–121)
BUN/Creatinine Ratio: 13 (ref 12–28)
BUN: 10 mg/dL (ref 8–27)
Bilirubin Total: 0.2 mg/dL (ref 0.0–1.2)
CO2: 22 mmol/L (ref 20–29)
Calcium: 9.3 mg/dL (ref 8.7–10.3)
Chloride: 107 mmol/L — ABNORMAL HIGH (ref 96–106)
Creatinine, Ser: 0.76 mg/dL (ref 0.57–1.00)
Globulin, Total: 2.2 g/dL (ref 1.5–4.5)
Glucose: 119 mg/dL — ABNORMAL HIGH (ref 70–99)
Potassium: 3.8 mmol/L (ref 3.5–5.2)
Sodium: 145 mmol/L — ABNORMAL HIGH (ref 134–144)
Total Protein: 6.8 g/dL (ref 6.0–8.5)
eGFR: 87 mL/min/{1.73_m2} (ref >59)

## 2022-03-15 ENCOUNTER — Ambulatory Visit: Payer: Medicare Other | Admitting: Podiatry

## 2022-03-15 ENCOUNTER — Encounter: Payer: Self-pay | Admitting: Podiatry

## 2022-03-15 VITALS — BP 128/60

## 2022-03-15 DIAGNOSIS — E119 Type 2 diabetes mellitus without complications: Secondary | ICD-10-CM | POA: Diagnosis not present

## 2022-03-15 DIAGNOSIS — M2141 Flat foot [pes planus] (acquired), right foot: Secondary | ICD-10-CM | POA: Diagnosis not present

## 2022-03-15 DIAGNOSIS — M2142 Flat foot [pes planus] (acquired), left foot: Secondary | ICD-10-CM | POA: Diagnosis not present

## 2022-03-15 DIAGNOSIS — E1142 Type 2 diabetes mellitus with diabetic polyneuropathy: Secondary | ICD-10-CM

## 2022-03-15 DIAGNOSIS — L84 Corns and callosities: Secondary | ICD-10-CM

## 2022-03-15 DIAGNOSIS — Z0189 Encounter for other specified special examinations: Secondary | ICD-10-CM

## 2022-03-15 DIAGNOSIS — B351 Tinea unguium: Secondary | ICD-10-CM

## 2022-03-15 DIAGNOSIS — M79676 Pain in unspecified toe(s): Secondary | ICD-10-CM | POA: Diagnosis not present

## 2022-03-15 NOTE — Progress Notes (Unsigned)
ANNUAL DIABETIC FOOT EXAM  Subjective: Christine Rocha presents today {jgcomplaint:23593}.  No chief complaint on file.  Patient confirms h/o diabetes.  Patient relates {Numbers; 0-100:15068} year h/o diabetes.  Patient denies any h/o foot wounds.  Patient has h/o foot ulcer of {jgPodToeLocator:23637}, which healed via help of ***.  Patient has h/o amputation(s): {jgamp:23617}.  Patient endorses symptoms of foot numbness.   Patient endorses symptoms of foot tingling.  Patient endorses symptoms of burning in feet.  Patient endorses symptoms of pins/needles sensation in feet.  Patient denies any numbness, tingling, burning, or pins/needle sensation in feet.  Patient has been diagnosed with neuropathy and it is managed with {JGNEUROPATHYMEDS:27053}.  Risk factors: diabetes, diabetic neuropathy, HTN, current tobacco user.  Gildardo Pounds, NP is patient's PCP.   Past Medical History:  Diagnosis Date   Arthritis    Right Knee   Diabetes mellitus without complication ([place])    Hypertension    Seizures ([place])    Patient Active Problem List   Diagnosis Date Noted   Diabetic peripheral neuropathy associated with type 2 diabetes mellitus ([place]) 03/12/2022   Mass of joint of left shoulder 07/16/2021   Current severe episode of major depressive disorder without psychotic features, unspecified whether recurrent ([place]) 03/04/2021   Yeast infection of the skin 11/03/2020   Femur fracture, left ([place]) 04/23/2017   Preventative health care 10/28/2014   Encounter for screening mammogram for breast cancer 10/28/2014   Primary osteoarthritis of left knee 10/28/2014   Essential hypertension 11/15/2013   Diabetes ([place]) 11/15/2013   Uncontrolled hypertension 10/11/2012   Smoking addiction 10/11/2012   Past Surgical History:  Procedure Laterality Date   COLONOSCOPY  2021   ORIF FEMUR FRACTURE Left 04/24/2017   Procedure: OPEN REDUCTION INTERNAL FIXATION (ORIF) DISTAL FEMUR FRACTURE;   Surgeon: [name], MD;  Location: [facility];  Service: Orthopedics;  Laterality: Left;   Current Outpatient Medications on File Prior to Visit  Medication Sig Dispense Refill   amLODipine (NORVASC) 10 MG tablet Take 1 tablet (10 mg total) by mouth daily. 90 tablet 1   atenolol (TENORMIN) 50 MG tablet Take 1 tablet (50 mg total) by mouth daily. 90 tablet 1   Blood Glucose Monitoring Suppl (CONTOUR NEXT EZ) w/Device KIT Use to check blood sugar once daily. 1 kit 0   Dulaglutide (TRULICITY) 1.5 0000000 SOPN Inject 1.5 mg into the skin once a week. NEEDS PRIOR AUTH 6 mL 1   glucose blood (CONTOUR NEXT TEST) test strip Use as directed to check blood sugar 3 times daily. 100 strip 3   hydrocortisone (ANUSOL-HC) 2.5 % rectal cream Place 1 application. rectally 2 (two) times daily. 60 g 1   lisinopril (ZESTRIL) 20 MG tablet Take 1 tablet (20 mg total) by mouth daily. 90 tablet 1   Microlet Lancets MISC Use to check blood sugar once daily. 100 each 2   Multiple Vitamins-Minerals (WOMENS 50+ MULTI VITAMIN/MIN) TABS Take 1 tablet by mouth daily.     nystatin-triamcinolone ointment (MYCOLOG) Apply 1 application. topically 2 (two) times daily. As needed under breasts and under stomach 100 g 0   rosuvastatin (CRESTOR) 10 MG tablet Take 1 tablet (10 mg total) by mouth daily. To lower cholesterol 90 tablet 1   traZODone (DESYREL) 50 MG tablet Take 0.5-1 tablets (25-50 mg total) by mouth at bedtime as needed for sleep. 30 tablet 3   No current facility-administered medications on file prior to visit.    Allergies  Allergen Reactions  Penicillins Nausea Only   Social History   Occupational History   Not on file  Tobacco Use   Smoking status: Some Days    Packs/day: 0.25    Years: 49.00    Total pack years: 12.25    Types: Cigarettes   Smokeless tobacco: Never  Vaping Use   Vaping Use: Never used  Substance and Sexual Activity   Alcohol use: No   Drug use: No   Sexual activity: Yes    Birth  control/protection: None   Family History  Problem Relation Age of Onset   Hypertension Mother    Breast cancer Mother    Hypertension Sister    Diabetes Sister    Immunization History  Administered Date(s) Administered   Influenza Inj Mdck Quad Pf 12/15/2017, 12/09/2021   Influenza Split 09/13/2012   Influenza,inj,Quad PF,6+ Mos 11/15/2013, 10/28/2014, 10/11/2016, 09/28/2018, 09/29/2020   Influenza-Unspecified 10/11/2016   PFIZER(Purple Top)SARS-COV-2 Vaccination 06/01/2019, 06/22/2019   Tdap 03/12/2022   Zoster Recombinat (Shingrix) 02/16/2020, 04/19/2020     Review of Systems: Negative except as noted in the HPI.   Objective: There were no vitals filed for this visit.  Christine Rocha is a pleasant 67 y.o. female in NAD. AAO X 3.  Vascular Examination: Capillary refill time immediate b/l. Vascular status intact b/l with palpable pedal pulses. Pedal hair sparse b/l. No edema. No pain with calf compression b/l. Skin temperature gradient WNL b/l. No ischemia or gangrene noted b/l LE. No cyanosis or clubbing noted b/l LE.  Neurological Examination: {jgneuro:23601::"Protective sensation intact 5/5 intact bilaterally with 10g monofilament b/l.","Vibratory sensation intact b/l.","Proprioception intact bilaterally."}  Dermatological Examination: Pedal skin with normal turgor, texture and tone b/l.  No open wounds. No interdigital macerations.   Toenails 1-5 b/l thick, discolored, elongated with subungual debris and pain on dorsal palpation.   Hyperkeratotic lesion(s) L hallux and R hallux.  No erythema, no edema, no drainage, no fluctuance.  Musculoskeletal Examination: Muscle strength 5/5 to all lower extremity muscle groups bilaterally. No pain, crepitus or joint limitation noted with ROM bilateral LE. Utilizes cane for ambulation assistance.  Radiographs: None  {jgxrayfindings:23683}  Last A1c:      Latest Ref Rng & Units 03/12/2022    9:21 AM 09/17/2021    9:31 AM  05/13/2021    8:40 AM  Hemoglobin A1C  Hemoglobin-A1c 0.0 - 7.0 % 7.0  7.3  11.2     Footwear Assessment: Does the patient wear appropriate shoes? Yes. Does the patient need inserts/orthotics? No.  Lab Results  Component Value Date   HGBA1C 7.0 03/12/2022    ADA Risk Categorization: Low Risk :  Patient has all of the following: Intact protective sensation No prior foot ulcer  No severe deformity Pedal pulses present  High Risk  Patient has one or more of the following: Loss of protective sensation Absent pedal pulses Severe Foot deformity History of foot ulcer  Assessment: 1. Pain due to onychomycosis of toenail   2. Callus   3. Diabetic peripheral neuropathy associated with type 2 diabetes mellitus ([place])   4. Encounter for diabetic foot exam ([place])     Plan: No orders of the defined types were placed in this encounter.   No orders of the defined types were placed in this encounter.   None  -Consent given for treatment as described below: -Examined patient. -Diabetic foot examination performed today. -Stressed the importance of good glycemic control and the detriment of not  controlling glucose levels in relation to the  foot. -Continue diabetic foot care principles: inspect feet daily, monitor glucose as recommended by PCP and/or Endocrinologist, and follow prescribed diet per PCP, Endocrinologist and/or dietician. -Patient to continue soft, supportive shoe gear daily. -Toenails 1-5 b/l were debrided in length and girth with sterile nail nippers without iatrogenic bleeding. Patient declined use of dremel. -Callus(es) bilateral great toes pared utilizing sterile scalpel blade without complication or incident. Total number debrided =2. -Patient/POA to call should there be question/concern in the interim. No follow-ups on file.  Marzetta Board, DPM

## 2022-03-16 DIAGNOSIS — H524 Presbyopia: Secondary | ICD-10-CM | POA: Diagnosis not present

## 2022-03-18 DIAGNOSIS — K080 Exfoliation of teeth due to systemic causes: Secondary | ICD-10-CM | POA: Diagnosis not present

## 2022-03-22 ENCOUNTER — Other Ambulatory Visit: Payer: Self-pay

## 2022-03-23 ENCOUNTER — Other Ambulatory Visit: Payer: Self-pay

## 2022-03-24 ENCOUNTER — Telehealth: Payer: Self-pay | Admitting: Nurse Practitioner

## 2022-03-24 ENCOUNTER — Ambulatory Visit (INDEPENDENT_AMBULATORY_CARE_PROVIDER_SITE_OTHER): Payer: Medicare Other

## 2022-03-24 DIAGNOSIS — M2142 Flat foot [pes planus] (acquired), left foot: Secondary | ICD-10-CM

## 2022-03-24 DIAGNOSIS — M2141 Flat foot [pes planus] (acquired), right foot: Secondary | ICD-10-CM

## 2022-03-24 DIAGNOSIS — E1142 Type 2 diabetes mellitus with diabetic polyneuropathy: Secondary | ICD-10-CM

## 2022-03-24 DIAGNOSIS — L84 Corns and callosities: Secondary | ICD-10-CM

## 2022-03-24 NOTE — Progress Notes (Signed)
Patient presents to the office today for diabetic shoe and insole measuring.  Patient was measured with brannock device to determine size and width for 1 pair of extra depth shoes and foam casted for 3 pair of insoles.   ABN signed.   Documentation of medical necessity will be sent to patient's treating diabetic doctor to verify and sign.   Patient's diabetic provider: Gildardo Pounds, NP treating---sending to Charlott Rakes, MD  Shoes and insoles will be ordered at that time and patient will be notified for an appointment for fitting when they arrive.   Brannock measurement: 8.5 W  Patient shoe selection-   1st   Shoe choice:   [id number]  Shoe size ordered: 9 W

## 2022-03-24 NOTE — Telephone Encounter (Signed)
Noted  

## 2022-03-24 NOTE — Telephone Encounter (Signed)
Copied from [place]. Topic: General - Other >> Mar 24, 2022  1:57 PM Dominique A wrote: Reason for CRM: Pt states that she had her appt with the foot doctor regarding diabetic shoes and is wanting to let her PCP know the fax will be sent to pt PCP tomorrow.

## 2022-03-25 ENCOUNTER — Other Ambulatory Visit: Payer: Self-pay

## 2022-03-29 ENCOUNTER — Other Ambulatory Visit: Payer: Self-pay

## 2022-04-01 ENCOUNTER — Other Ambulatory Visit: Payer: Self-pay

## 2022-04-06 ENCOUNTER — Telehealth: Payer: Self-pay | Admitting: Nurse Practitioner

## 2022-04-06 NOTE — Telephone Encounter (Signed)
Contacted Christine Rocha to schedule their annual wellness visit. Appointment made for 04/07/22.  Christine Rocha AWV direct phone # [phone]

## 2022-04-07 ENCOUNTER — Ambulatory Visit: Payer: Medicare Other | Attending: Nurse Practitioner

## 2022-04-07 VITALS — Ht 61.0 in | Wt 191.0 lb

## 2022-04-07 DIAGNOSIS — Z0000 Encounter for general adult medical examination without abnormal findings: Secondary | ICD-10-CM | POA: Diagnosis not present

## 2022-04-07 NOTE — Patient Instructions (Signed)
[name] , Thank you for taking time to come for your Medicare Wellness Visit. I appreciate your ongoing commitment to your health goals. Please review the following plan we discussed and let me know if I can assist you in the future.   These are the goals we discussed:  Goals      Patient Stated     04/07/2022, no goals        This is a list of the screening recommended for you and due dates:  Health Maintenance  Topic Date Due   COVID-19 Vaccine (3 - Pfizer risk series) 07/20/2019   Yearly kidney health urinalysis for diabetes  12/28/2019   Eye exam for diabetics  05/07/2021   Pap Smear  09/11/2022   Hemoglobin A1C  09/12/2022   Yearly kidney function blood test for diabetes  03/12/2023   Complete foot exam   03/15/2023   Medicare Annual Wellness Visit  04/07/2023   Mammogram  02/13/2024   Colon Cancer Screening  04/20/2027   DTaP/Tdap/Td vaccine (2 - Td or Tdap) 03/11/2032   Flu Shot  Completed   Hepatitis C Screening: USPSTF Recommendation to screen - Ages 18-79 yo.  Completed   HIV Screening  Completed   Zoster (Shingles) Vaccine  Completed   HPV Vaccine  Aged Out    Advanced directives: Advance directive discussed with you today.   Conditions/risks identified: smoking  Next appointment: Follow up in one year for your annual wellness visit.   Preventive Care 40-64 Years, Female Preventive care refers to lifestyle choices and visits with your health care provider that can promote health and wellness. What does preventive care include? A yearly physical exam. This is also called an annual well check. Dental exams once or twice a year. Routine eye exams. Ask your health care provider how often you should have your eyes checked. Personal lifestyle choices, including: Daily care of your teeth and gums. Regular physical activity. Eating a healthy diet. Avoiding tobacco and drug use. Limiting alcohol use. Practicing safe sex. Taking low-dose aspirin daily starting  at age 60. Taking vitamin and mineral supplements as recommended by your health care provider. What happens during an annual well check? The services and screenings done by your health care provider during your annual well check will depend on your age, overall health, lifestyle risk factors, and family history of disease. Counseling  Your health care provider may ask you questions about your: Alcohol use. Tobacco use. Drug use. Emotional well-being. Home and relationship well-being. Sexual activity. Eating habits. Work and work Statistician. Method of birth control. Menstrual cycle. Pregnancy history. Screening  You may have the following tests or measurements: Height, weight, and BMI. Blood pressure. Lipid and cholesterol levels. These may be checked every 5 years, or more frequently if you are over 61 years old. Skin check. Lung cancer screening. You may have this screening every year starting at age 85 if you have a 30-pack-year history of smoking and currently smoke or have quit within the past 15 years. Fecal occult blood test (FOBT) of the stool. You may have this test every year starting at age 64. Flexible sigmoidoscopy or colonoscopy. You may have a sigmoidoscopy every 5 years or a colonoscopy every 10 years starting at age 61. Hepatitis C blood test. Hepatitis B blood test. Sexually transmitted disease (STD) testing. Diabetes screening. This is done by checking your blood sugar (glucose) after you have not eaten for a while (fasting). You may have this done every 1-3 years.  Mammogram. This may be done every 1-2 years. Talk to your health care provider about when you should start having regular mammograms. This may depend on whether you have a family history of breast cancer. BRCA-related cancer screening. This may be done if you have a family history of breast, ovarian, tubal, or peritoneal cancers. Pelvic exam and Pap test. This may be done every 3 years starting at age 48.  Starting at age 61, this may be done every 5 years if you have a Pap test in combination with an HPV test. Bone density scan. This is done to screen for osteoporosis. You may have this scan if you are at high risk for osteoporosis. Discuss your test results, treatment options, and if necessary, the need for more tests with your health care provider. Vaccines  Your health care provider may recommend certain vaccines, such as: Influenza vaccine. This is recommended every year. Tetanus, diphtheria, and acellular pertussis (Tdap, Td) vaccine. You may need a Td booster every 10 years. Zoster vaccine. You may need this after age 91. Pneumococcal 13-valent conjugate (PCV13) vaccine. You may need this if you have certain conditions and were not previously vaccinated. Pneumococcal polysaccharide (PPSV23) vaccine. You may need one or two doses if you smoke cigarettes or if you have certain conditions. Talk to your health care provider about which screenings and vaccines you need and how often you need them. This information is not intended to replace advice given to you by your health care provider. Make sure you discuss any questions you have with your health care provider. Document Released: 01/24/2015 Document Revised: 09/17/2015 Document Reviewed: 10/29/2014 Elsevier Interactive Patient Education  2017 [place] Prevention in the Home Falls can cause injuries. They can happen to people of all ages. There are many things you can do to make your home safe and to help prevent falls. What can I do on the outside of my home? Regularly fix the edges of walkways and driveways and fix any cracks. Remove anything that might make you trip as you walk through a door, such as a raised step or threshold. Trim any bushes or trees on the path to your home. Use bright outdoor lighting. Clear any walking paths of anything that might make someone trip, such as rocks or tools. Regularly check to see if  handrails are loose or broken. Make sure that both sides of any steps have handrails. Any raised decks and porches should have guardrails on the edges. Have any leaves, snow, or ice cleared regularly. Use sand or salt on walking paths during winter. Clean up any spills in your garage right away. This includes oil or grease spills. What can I do in the bathroom? Use night lights. Install grab bars by the toilet and in the tub and shower. Do not use towel bars as grab bars. Use non-skid mats or decals in the tub or shower. If you need to sit down in the shower, use a plastic, non-slip stool. Keep the floor dry. Clean up any water that spills on the floor as soon as it happens. Remove soap buildup in the tub or shower regularly. Attach bath mats securely with double-sided non-slip rug tape. Do not have throw rugs and other things on the floor that can make you trip. What can I do in the bedroom? Use night lights. Make sure that you have a light by your bed that is easy to reach. Do not use any sheets or blankets that  are too big for your bed. They should not hang down onto the floor. Have a firm chair that has side arms. You can use this for support while you get dressed. Do not have throw rugs and other things on the floor that can make you trip. What can I do in the kitchen? Clean up any spills right away. Avoid walking on wet floors. Keep items that you use a lot in easy-to-reach places. If you need to reach something above you, use a strong step stool that has a grab bar. Keep electrical cords out of the way. Do not use floor polish or wax that makes floors slippery. If you must use wax, use non-skid floor wax. Do not have throw rugs and other things on the floor that can make you trip. What can I do with my stairs? Do not leave any items on the stairs. Make sure that there are handrails on both sides of the stairs and use them. Fix handrails that are broken or loose. Make sure that  handrails are as long as the stairways. Check any carpeting to make sure that it is firmly attached to the stairs. Fix any carpet that is loose or worn. Avoid having throw rugs at the top or bottom of the stairs. If you do have throw rugs, attach them to the floor with carpet tape. Make sure that you have a light switch at the top of the stairs and the bottom of the stairs. If you do not have them, ask someone to add them for you. What else can I do to help prevent falls? Wear shoes that: Do not have high heels. Have rubber bottoms. Are comfortable and fit you well. Are closed at the toe. Do not wear sandals. If you use a stepladder: Make sure that it is fully opened. Do not climb a closed stepladder. Make sure that both sides of the stepladder are locked into place. Ask someone to hold it for you, if possible. Clearly mark and make sure that you can see: Any grab bars or handrails. First and last steps. Where the edge of each step is. Use tools that help you move around (mobility aids) if they are needed. These include: Canes. Walkers. Scooters. Crutches. Turn on the lights when you go into a dark area. Replace any light bulbs as soon as they burn out. Set up your furniture so you have a clear path. Avoid moving your furniture around. If any of your floors are uneven, fix them. If there are any pets around you, be aware of where they are. Review your medicines with your doctor. Some medicines can make you feel dizzy. This can increase your chance of falling. Ask your doctor what other things that you can do to help prevent falls. This information is not intended to replace advice given to you by your health care provider. Make sure you discuss any questions you have with your health care provider. Document Released: 10/24/2008 Document Revised: 06/05/2015 Document Reviewed: 02/01/2014 Elsevier Interactive Patient Education  2017 [employer].

## 2022-04-07 NOTE — Progress Notes (Signed)
I connected with  [name] on 04/07/22 by a audio enabled telemedicine application and verified that I am speaking with the correct person using two identifiers.  Patient Location: Home  Provider Location: Office/Clinic  I discussed the limitations of evaluation and management by telemedicine. The patient expressed understanding and agreed to proceed.  Subjective:   Christine Rocha is a 67 y.o. female who presents for an Initial Medicare Annual Wellness Visit.  Review of Systems     Cardiac Risk Factors include: advanced age (>59men, >90 women);smoking/ tobacco exposure;diabetes mellitus;hypertension;obesity (BMI >30kg/m2)     Objective:    Today's Vitals   04/07/22 0811 04/07/22 0812  Weight: 191 lb (86.6 kg)   Height: 5\' 1"  (1.549 m)   PainSc:  7    Body mass index is 36.09 kg/m.     04/07/2022    8:17 AM 08/25/2021    9:08 AM 04/13/2021   10:45 AM 10/14/2020    8:14 AM 05/06/2020    8:25 AM 04/23/2017   11:38 AM 05/26/2016    9:12 AM  Advanced Directives  Does Patient Have a Medical Advance Directive? No No No No No No No  Would patient like information on creating a medical advance directive?  Yes (MAU/Ambulatory/Procedural Areas - Information given)  Yes (MAU/Ambulatory/Procedural Areas - Information given) Yes (MAU/Ambulatory/Procedural Areas - Information given) No - Patient declined     Current Medications (verified) Outpatient Encounter Medications as of 04/07/2022  Medication Sig   amLODipine (NORVASC) 10 MG tablet Take 1 tablet (10 mg total) by mouth daily.   atenolol (TENORMIN) 50 MG tablet Take 1 tablet (50 mg total) by mouth daily.   Blood Glucose Monitoring Suppl (CONTOUR NEXT EZ) w/Device KIT Use to check blood sugar once daily.   glucose blood (CONTOUR NEXT TEST) test strip Use as directed to check blood sugar 3 times daily.   hydrocortisone (ANUSOL-HC) 2.5 % rectal cream Place 1 application. rectally 2 (two) times daily.   lisinopril (ZESTRIL) 20 MG  tablet Take 1 tablet (20 mg total) by mouth daily.   Microlet Lancets MISC Use to check blood sugar once daily.   Multiple Vitamins-Minerals (WOMENS 50+ MULTI VITAMIN/MIN) TABS Take 1 tablet by mouth daily.   nystatin-triamcinolone ointment (MYCOLOG) Apply 1 application. topically 2 (two) times daily. As needed under breasts and under stomach   rosuvastatin (CRESTOR) 10 MG tablet Take 1 tablet (10 mg total) by mouth daily. To lower cholesterol   traZODone (DESYREL) 50 MG tablet Take 0.5-1 tablets (25-50 mg total) by mouth at bedtime as needed for sleep.   Dulaglutide (TRULICITY) 1.5 0000000 SOPN Inject 1.5 mg into the skin once a week. (Patient not taking: Reported on 04/07/2022)   No facility-administered encounter medications on file as of 04/07/2022.    Allergies (verified) Penicillins   History: Past Medical History:  Diagnosis Date   Arthritis    Right Knee   Diabetes mellitus without complication ([place])    Hypertension    Seizures ([place])    Past Surgical History:  Procedure Laterality Date   COLONOSCOPY  2021   ORIF FEMUR FRACTURE Left 04/24/2017   Procedure: OPEN REDUCTION INTERNAL FIXATION (ORIF) DISTAL FEMUR FRACTURE;  Surgeon: [name], MD;  Location: [place] [facility];  Service: Orthopedics;  Laterality: Left;   Family History  Problem Relation Age of Onset   Hypertension Mother    Breast cancer Mother    Hypertension Sister    Diabetes Sister    Social History  Socioeconomic History   Marital status: Married    Spouse name: Not on file   Number of children: Not on file   Years of education: Not on file   Highest education level: Not on file  Occupational History   Not on file  Tobacco Use   Smoking status: Some Days    Packs/day: 0.25    Years: 49.00    Additional pack years: 0.00    Total pack years: 12.25    Types: Cigarettes   Smokeless tobacco: Never  Vaping Use   Vaping Use: Never used  Substance and Sexual Activity   Alcohol use: No   Drug use: No    Sexual activity: Yes    Birth control/protection: None  Other Topics Concern   Not on file  Social History Narrative   Not on file   Social Determinants of Health   Financial Resource Strain: Low Risk  (04/07/2022)   Overall Financial Resource Strain (CARDIA)    Difficulty of Paying Living Expenses: Not hard at all  Food Insecurity: No Food Insecurity (04/07/2022)   Hunger Vital Sign    Worried About Running Out of Food in the Last Year: Never true    Ran Out of Food in the Last Year: Never true  Transportation Needs: No Transportation Needs (04/07/2022)   PRAPARE - Hydrologist (Medical): No    Lack of Transportation (Non-Medical): No  Physical Activity: Sufficiently Active (04/07/2022)   Exercise Vital Sign    Days of Exercise per Week: 7 days    Minutes of Exercise per Session: 60 min  Stress: No Stress Concern Present (04/07/2022)   [place]    Feeling of Stress : Only a little  Social Connections: Not on file    Tobacco Counseling Ready to quit: Yes Counseling given: Not Answered   Clinical Intake:  Pre-visit preparation completed: Yes  Pain : 0-10 Pain Score: 7  Pain Type: Chronic pain Pain Location: Leg Pain Orientation: Left Pain Descriptors / Indicators: Aching Pain Onset: More than a month ago Pain Frequency: Intermittent     Nutritional Status: BMI > 30  Obese Nutritional Risks: None Diabetes: Yes  How often do you need to have someone help you when you read instructions, pamphlets, or other written materials from your doctor or pharmacy?: 1 - Never  Diabetic? Yes Nutrition Risk Assessment:  Has the patient had any N/V/D within the last 2 months?  No  Does the patient have any non-healing wounds?  No  Has the patient had any unintentional weight loss or weight gain?  No   Diabetes:  Is the patient diabetic?  Yes  If diabetic, was a CBG obtained today?   No  Did the patient bring in their glucometer from home?  No  How often do you monitor your CBG's? Twice daily.   Financial Strains and Diabetes Management:  Are you having any financial strains with the device, your supplies or your medication? No .  Does the patient want to be seen by Chronic Care Management for management of their diabetes?  No  Would the patient like to be referred to a Nutritionist or for Diabetic Management?  No   Diabetic Exams:  Diabetic Eye Exam: Overdue for diabetic eye exam. Pt has been advised about the importance in completing this exam. Patient advised to call and schedule an eye exam. Diabetic Foot Exam: Completed 03/15/2022   Interpreter Needed?: No  Information entered by :: NAllen LPN   Activities of Daily Living    04/07/2022    8:20 AM  In your present state of health, do you have any difficulty performing the following activities:  Hearing? 0  Vision? 0  Difficulty concentrating or making decisions? 1  Walking or climbing stairs? 1  Dressing or bathing? 0  Doing errands, shopping? 0  Preparing Food and eating ? N  Using the Toilet? N  In the past six months, have you accidently leaked urine? Y  Do you have problems with loss of bowel control? N  Managing your Medications? N  Managing your Finances? N  Housekeeping or managing your Housekeeping? N    Patient Care Team: Gildardo Pounds, NP as PCP - General (Nurse Practitioner) Daneen Schick as [place] [facility] [place], Claudette Stapler, RN as [place] any recent Medical Services you may have received from other than [facility] providers in the past year (date may be approximate).     Assessment:   This is a routine wellness examination for [facility].  Hearing/Vision screen Vision Screening - Comments:: Regular eye exams, [facility]  Dietary issues and exercise activities discussed: Current Exercise Habits: Home exercise routine, Type  of exercise: walking, Time (Minutes): 60, Frequency (Times/Week): 7, Weekly Exercise (Minutes/Week): 420   Goals Addressed             This Visit's Progress    Patient Stated       04/07/2022, no goals       Depression Screen    04/07/2022    8:19 AM 03/12/2022    8:54 AM 12/09/2021   11:28 AM 04/22/2021   11:05 AM 02/27/2021    8:44 AM 11/03/2020    8:55 AM 10/30/2020    3:33 PM  PHQ 2/9 Scores  PHQ - 2 Score 2 6 6 5 2  0 2  PHQ- 9 Score 4 22 24 23 9 4 5     Fall Risk    04/07/2022    8:18 AM 03/12/2022    8:33 AM 12/09/2021    9:39 AM 08/25/2021    9:05 AM 04/22/2021   11:05 AM  Fall Risk   Falls in the past year? 0 0 0 0 0  Number falls in past yr: 0 0 0 0 0  Injury with Fall? 0 0 0 0 0  Risk for fall due to : Medication side effect;Impaired balance/gait;Impaired mobility No Fall Risks  Impaired balance/gait No Fall Risks  Follow up Falls prevention discussed;Education provided;Falls evaluation completed Falls evaluation completed Falls evaluation completed Falls prevention discussed     FALL RISK PREVENTION PERTAINING TO THE HOME:  Any stairs in or around the home? No  If so, are there any without handrails? N/a Home free of loose throw rugs in walkways, pet beds, electrical cords, etc? Yes  Adequate lighting in your home to reduce risk of falls? Yes   ASSISTIVE DEVICES UTILIZED TO PREVENT FALLS:  Life alert? No  Use of a cane, walker or w/c? Yes  Grab bars in the bathroom? No  Shower chair or bench in shower? No  Elevated toilet seat or a handicapped toilet? Yes   TIMED UP AND GO:  Was the test performed? No .      Cognitive Function:        04/07/2022    8:21 AM  6CIT Screen  What Year? 0 points  What month? 0 points  What  time? 0 points  Count back from 20 0 points  Months in reverse 0 points  Repeat phrase 0 points  Total Score 0 points    Immunizations Immunization History  Administered Date(s) Administered   Influenza Inj Mdck Quad Pf  12/15/2017, 12/09/2021   Influenza Split 09/13/2012   Influenza,inj,Quad PF,6+ Mos 11/15/2013, 10/28/2014, 10/11/2016, 09/28/2018, 09/29/2020   Influenza-Unspecified 10/11/2016   PFIZER(Purple Top)SARS-COV-2 Vaccination 06/01/2019, 06/22/2019   Tdap 03/12/2022   Zoster Recombinat (Shingrix) 02/16/2020, 04/19/2020    TDAP status: Up to date  Flu Vaccine status: Up to date  Pneumococcal vaccine status: Up to date  Covid-19 vaccine status: Completed vaccines  Qualifies for Shingles Vaccine? Yes   Zostavax completed Yes   Shingrix Completed?: Yes  Screening Tests Health Maintenance  Topic Date Due   Medicare Annual Wellness (AWV)  Never done   COVID-19 Vaccine (3 - Pfizer risk series) 07/20/2019   Diabetic kidney evaluation - Urine ACR  12/28/2019   OPHTHALMOLOGY EXAM  05/07/2021   PAP SMEAR-Modifier  09/11/2022   HEMOGLOBIN A1C  09/12/2022   Diabetic kidney evaluation - eGFR measurement  03/12/2023   FOOT EXAM  03/15/2023   MAMMOGRAM  02/13/2024   COLONOSCOPY (Pts 45-56yrs Insurance coverage will need to be confirmed)  04/20/2027   DTaP/Tdap/Td (2 - Td or Tdap) 03/11/2032   INFLUENZA VACCINE  Completed   Hepatitis C Screening  Completed   HIV Screening  Completed   Zoster Vaccines- Shingrix  Completed   HPV VACCINES  Aged Out    Health Maintenance  Health Maintenance Due  Topic Date Due   Medicare Annual Wellness (AWV)  Never done   COVID-19 Vaccine (3 - Pfizer risk series) 07/20/2019   Diabetic kidney evaluation - Urine ACR  12/28/2019   OPHTHALMOLOGY EXAM  05/07/2021    Colorectal cancer screening: Type of screening: Colonoscopy. Completed 04/19/2017. Repeat every 10 years  Mammogram status: Completed 02/12/2022. Repeat every year  Bone Density status: n/a  Lung Cancer Screening: (Low Dose CT Chest recommended if Age 23-80 years, 30 pack-year currently smoking OR have quit w/in 15years.) does not qualify.   Lung Cancer Screening Referral: no  Additional  Screening:  Hepatitis C Screening: does qualify; Completed 09/29/2020  Vision Screening: Recommended annual ophthalmology exams for early detection of glaucoma and other disorders of the eye. Is the patient up to date with their annual eye exam?  No  Who is the provider or what is the name of the office in which the patient attends annual eye exams? [place] If pt is not established with a provider, would they like to be referred to a provider to establish care? No .   Dental Screening: Recommended annual dental exams for proper oral hygiene  Community Resource Referral / Chronic Care Management: CRR required this visit?  No   CCM required this visit?  No      Plan:     I have personally reviewed and noted the following in the patient's chart:   Medical and social history Use of alcohol, tobacco or illicit drugs  Current medications and supplements including opioid prescriptions. Patient is not currently taking opioid prescriptions. Functional ability and status Nutritional status Physical activity Advanced directives List of other physicians Hospitalizations, surgeries, and ER visits in previous 12 months Vitals Screenings to include cognitive, depression, and falls Referrals and appointments  In addition, I have reviewed and discussed with patient certain preventive protocols, quality metrics, and best practice recommendations. A written personalized care plan for  preventive services as well as general preventive health recommendations were provided to patient.     Kellie Simmering, LPN   [id number]   Nurse Notes: none  Due to this being a virtual visit, the after visit summary with patients personalized plan was offered to patient via mail or my-chart. to pick up at office at next visit

## 2022-04-08 ENCOUNTER — Other Ambulatory Visit: Payer: Self-pay

## 2022-04-15 DIAGNOSIS — E119 Type 2 diabetes mellitus without complications: Secondary | ICD-10-CM | POA: Diagnosis not present

## 2022-04-15 DIAGNOSIS — H35363 Drusen (degenerative) of macula, bilateral: Secondary | ICD-10-CM | POA: Diagnosis not present

## 2022-04-15 LAB — HM DIABETES EYE EXAM

## 2022-04-20 DIAGNOSIS — K080 Exfoliation of teeth due to systemic causes: Secondary | ICD-10-CM | POA: Diagnosis not present

## 2022-04-23 ENCOUNTER — Telehealth: Payer: Self-pay | Admitting: Nurse Practitioner

## 2022-04-23 NOTE — Telephone Encounter (Signed)
Return call unanswered.  

## 2022-04-23 NOTE — Telephone Encounter (Signed)
Copied from CRM [id number]. Topic: General - Other >> Apr 23, 2022  2:38 PM Everette C wrote: Reason for CRM: The patient has returned a missed call from . P. Reinaldo Raddle CMA   Please contact the patient further when possible

## 2022-04-28 NOTE — Telephone Encounter (Signed)
Patient identified by name and date of birth.  Appointment scheduled.  

## 2022-05-04 ENCOUNTER — Telehealth: Payer: Medicare Other | Admitting: Family Medicine

## 2022-05-12 ENCOUNTER — Ambulatory Visit: Payer: Medicare Other | Attending: Family Medicine | Admitting: Family Medicine

## 2022-05-12 ENCOUNTER — Other Ambulatory Visit: Payer: Self-pay

## 2022-05-12 ENCOUNTER — Encounter: Payer: Self-pay | Admitting: Family Medicine

## 2022-05-12 VITALS — BP 150/74 | HR 75 | Ht 63.0 in | Wt 192.0 lb

## 2022-05-12 DIAGNOSIS — F17210 Nicotine dependence, cigarettes, uncomplicated: Secondary | ICD-10-CM | POA: Diagnosis not present

## 2022-05-12 DIAGNOSIS — I10 Essential (primary) hypertension: Secondary | ICD-10-CM | POA: Diagnosis not present

## 2022-05-12 DIAGNOSIS — E1142 Type 2 diabetes mellitus with diabetic polyneuropathy: Secondary | ICD-10-CM | POA: Diagnosis not present

## 2022-05-12 DIAGNOSIS — Z7985 Long-term (current) use of injectable non-insulin antidiabetic drugs: Secondary | ICD-10-CM

## 2022-05-12 NOTE — Progress Notes (Signed)
Diabetic shoe notes.

## 2022-05-12 NOTE — Progress Notes (Signed)
Subjective:  Patient ID: Christine Rocha, female    DOB: 06/17/1955  Age: 67 y.o. MRN: [id number]  CC: Diabetes   HPI Christine Rocha is a 67 y.o. year old female patient of Bertram Denver, FNP with a history of hypertension, type 2 diabetes mellitus, hyperlipidemia  Interval History:  She is requiring diabetic shoes and the form requires completion by a physician. Seen by Podiatry last month and diagnosed with Pes planus. She also has diabetic neuropathy. She has no foot ulcers.  A1c 7.0 and she endorses adherence with her medications. Her blood pressure is elevated and she endorses adherence with her antihypertensive. She has no additional concerns today. Past Medical History:  Diagnosis Date   Arthritis    Right Knee   Diabetes mellitus without complication (HCC)    Hypertension    Seizures (HCC)     Past Surgical History:  Procedure Laterality Date   COLONOSCOPY  2021   ORIF FEMUR FRACTURE Left 04/24/2017   Procedure: OPEN REDUCTION INTERNAL FIXATION (ORIF) DISTAL FEMUR FRACTURE;  Surgeon: Nadara Mustard, MD;  Location: [facility] OR;  Service: Orthopedics;  Laterality: Left;    Family History  Problem Relation Age of Onset   Hypertension Mother    Breast cancer Mother    Hypertension Sister    Diabetes Sister     Social History   Socioeconomic History   Marital status: Married    Spouse name: Not on file   Number of children: Not on file   Years of education: Not on file   Highest education level: Not on file  Occupational History   Not on file  Tobacco Use   Smoking status: Some Days    Packs/day: 0.25    Years: 49.00    Additional pack years: 0.00    Total pack years: 12.25    Types: Cigarettes   Smokeless tobacco: Never  Vaping Use   Vaping Use: Never used  Substance and Sexual Activity   Alcohol use: No   Drug use: No   Sexual activity: Yes    Birth control/protection: None  Other Topics Concern   Not on file  Social History Narrative   Not on file    Social Determinants of Health   Financial Resource Strain: Low Risk  (04/07/2022)   Overall Financial Resource Strain (CARDIA)    Difficulty of Paying Living Expenses: Not hard at all  Food Insecurity: No Food Insecurity (04/07/2022)   Hunger Vital Sign    Worried About Running Out of Food in the Last Year: Never true    Ran Out of Food in the Last Year: Never true  Transportation Needs: No Transportation Needs (04/07/2022)   PRAPARE - Administrator, Civil Service (Medical): No    Lack of Transportation (Non-Medical): No  Physical Activity: Sufficiently Active (04/07/2022)   Exercise Vital Sign    Days of Exercise per Week: 7 days    Minutes of Exercise per Session: 60 min  Stress: No Stress Concern Present (04/07/2022)   [employer] of Occupational Health - Occupational Stress Questionnaire    Feeling of Stress : Only a little  Social Connections: Not on file    Allergies  Allergen Reactions   Penicillins Nausea Only    Outpatient Medications Prior to Visit  Medication Sig Dispense Refill   amLODipine (NORVASC) 10 MG tablet Take 1 tablet (10 mg total) by mouth daily. 90 tablet 1   atenolol (TENORMIN) 50 MG tablet Take 1 tablet (  50 mg total) by mouth daily. 90 tablet 1   Blood Glucose Monitoring Suppl (CONTOUR NEXT EZ) w/Device KIT Use to check blood sugar once daily. 1 kit 0   Dulaglutide (TRULICITY) 1.5 MG/0.5ML SOPN Inject 1.5 mg into the skin once a week. 6 mL 1   glucose blood (CONTOUR NEXT TEST) test strip Use as directed to check blood sugar 3 times daily. 100 strip 3   hydrocortisone (ANUSOL-HC) 2.5 % rectal cream Place 1 application. rectally 2 (two) times daily. 60 g 1   lisinopril (ZESTRIL) 20 MG tablet Take 1 tablet (20 mg total) by mouth daily. 90 tablet 1   Microlet Lancets MISC Use to check blood sugar once daily. 100 each 2   Multiple Vitamins-Minerals (WOMENS 50+ MULTI VITAMIN/MIN) TABS Take 1 tablet by mouth daily.     nystatin-triamcinolone  ointment (MYCOLOG) Apply 1 application. topically 2 (two) times daily. As needed under breasts and under stomach 100 g 0   rosuvastatin (CRESTOR) 10 MG tablet Take 1 tablet (10 mg total) by mouth daily. To lower cholesterol 90 tablet 1   traZODone (DESYREL) 50 MG tablet Take 0.5-1 tablets (25-50 mg total) by mouth at bedtime as needed for sleep. 30 tablet 3   No facility-administered medications prior to visit.     ROS Review of Systems  Constitutional:  Negative for activity change and appetite change.  HENT:  Negative for sinus pressure and sore throat.   Respiratory:  Negative for chest tightness, shortness of breath and wheezing.   Cardiovascular:  Negative for chest pain and palpitations.  Gastrointestinal:  Negative for abdominal distention, abdominal pain and constipation.  Genitourinary: Negative.   Musculoskeletal: Negative.   Psychiatric/Behavioral:  Negative for behavioral problems and dysphoric mood.     Objective:  BP (!) 150/74   Pulse 75   Ht 5\' 3"  (1.6 m)   Wt 192 lb (87.1 kg)   SpO2 98%   BMI 34.01 kg/m      05/12/2022   10:31 AM 04/07/2022    8:11 AM 03/15/2022    8:15 AM  BP/Weight  Systolic BP 150  960  Diastolic BP 74  60  Wt. (Lbs) 192 191   BMI 34.01 kg/m2 36.09 kg/m2       Physical Exam Constitutional:      Appearance: She is well-developed.  Cardiovascular:     Rate and Rhythm: Normal rate.     Heart sounds: Normal heart sounds. No murmur heard. Pulmonary:     Effort: Pulmonary effort is normal.     Breath sounds: Normal breath sounds. No wheezing or rales.  Chest:     Chest wall: No tenderness.  Abdominal:     General: Bowel sounds are normal. There is no distension.     Palpations: Abdomen is soft. There is no mass.     Tenderness: There is no abdominal tenderness.  Musculoskeletal:        General: Normal range of motion.     Right lower leg: No edema.     Left lower leg: No edema.  Neurological:     Mental Status: She is alert and  oriented to person, place, and time.  Psychiatric:        Mood and Affect: Mood normal.        Latest Ref Rng & Units 03/12/2022    9:17 AM 12/09/2021   10:13 AM 05/13/2021    8:40 AM  CMP  Glucose 70 - 99 mg/dL 454  098  85   BUN 8 - 27 mg/dL 10  11  8    Creatinine 0.57 - 1.00 mg/dL 1.61  0.96  0.45   Sodium 134 - 144 mmol/L 145  142  140   Potassium 3.5 - 5.2 mmol/L 3.8  3.8  4.4   Chloride 96 - 106 mmol/L 107  104  102   CO2 20 - 29 mmol/L 22  22  24    Calcium 8.7 - 10.3 mg/dL 9.3  9.7  9.9   Total Protein 6.0 - 8.5 g/dL 6.8  6.9    Total Bilirubin 0.0 - 1.2 mg/dL 0.2  0.2    Alkaline Phos 44 - 121 IU/L 78  81    AST 0 - 40 IU/L 15  15    ALT 0 - 32 IU/L 14  16      Lipid Panel     Component Value Date/Time   CHOL 116 10/20/2021 1007   TRIG 130 10/20/2021 1007   HDL 35 (L) 10/20/2021 1007   CHOLHDL 3.3 10/20/2021 1007   CHOLHDL 3.8 02/25/2016 1737   VLDL 33 (H) 02/25/2016 1737   LDLCALC 58 10/20/2021 1007    CBC    Component Value Date/Time   WBC 5.4 04/13/2021 0944   RBC 4.76 04/13/2021 0944   HGB 14.1 04/13/2021 0944   HGB 13.7 09/28/2018 0956   HCT 41.6 04/13/2021 0944   HCT 41.6 09/28/2018 0956   PLT 240 04/13/2021 0944   PLT 291 09/28/2018 0956   MCV 87.4 04/13/2021 0944   MCV 87 09/28/2018 0956   MCH 29.6 04/13/2021 0944   MCHC 33.9 04/13/2021 0944   RDW 12.1 04/13/2021 0944   RDW 13.1 09/28/2018 0956   LYMPHSABS 1.9 09/28/2018 0956   MONOABS 595 02/25/2016 1737   EOSABS 0.1 09/28/2018 0956   BASOSABS 0.0 09/28/2018 0956    Lab Results  Component Value Date   HGBA1C 7.0 03/12/2022    Assessment & Plan:  1. Diabetic peripheral neuropathy associated with type 2 diabetes mellitus (HCC) Diabetes is controlled with A1c of 7.0 Continue Trulicity Neuropathy is controlled - not on medications Underlying pes planus Symptoms are stable Completed form for diabetic shoes  2.  Primary hypertension Blood pressure is elevated At her last visit with  PCP blood pressure was normal I will make no regimen change Counseled on blood pressure goal of less than 130/80, low-sodium, DASH diet, medication compliance, 150 minutes of moderate intensity exercise per week. Discussed medication compliance, adverse effects.   No orders of the defined types were placed in this encounter.   Follow-up: Return for Medical conditions with PCP.       Hoy Register, MD, FAAFP. [facility] [place], [place] [phone]   05/12/2022, 12:53 PM

## 2022-05-12 NOTE — Patient Instructions (Signed)
Diabetic Neuropathy Diabetic neuropathy refers to nerve damage that is caused by diabetes. Over time, people with diabetes can develop nerve damage throughout the body. There are several types of diabetic neuropathy: Peripheral neuropathy. This is the most common type of diabetic neuropathy. It damages the nerves that carry signals between the spinal cord and other parts of the body (peripheral nerves). This usually affects nerves in the feet, legs, hands, and arms. Autonomic neuropathy. This type causes damage to nerves that control involuntary functions (autonomic nerves). Involuntary functions are functions of the body that you do not control. They include heartbeat, body temperature, blood pressure, urination, digestion, sweating, sexual function, or response to changes in blood glucose. Focal neuropathy. This type of nerve damage affects one area of the body, such as an arm, a leg, or the face. The injury may involve one nerve or a small group of nerves. Focal neuropathy can be painful and unpredictable. It occurs most often in older adults with diabetes. This often develops suddenly, but usually improves over time and does not cause long-term problems. Proximal neuropathy. This type of nerve damage affects the nerves of the thighs, hips, buttocks, or legs. It causes severe pain, weakness, and muscle death (atrophy), usually in the thigh muscles. It is more common among older men and people who have type 2 diabetes. The length of recovery time may vary. What are the causes? Peripheral, autonomic, and focal neuropathies are caused by diabetes that is not well controlled with treatment. The cause of proximal neuropathy is not known, but it may be caused by inflammation related to uncontrolled blood glucose levels. What are the signs or symptoms? Peripheral neuropathy Peripheral neuropathy develops slowly over time. When the nerves of the feet and legs no longer work, you may experience: Burning,  stabbing, or aching pain in the legs or feet. Pain or cramping in the legs or feet. Loss of feeling (numbness) and inability to feel pressure or pain in the feet. This can lead to: Thick calluses or sores on areas of constant pressure. Ulcers. Reduced ability to feel temperature changes. Foot deformities. Muscle weakness. Loss of balance or coordination. Autonomic neuropathy The symptoms of autonomic neuropathy vary depending on which nerves are affected. Symptoms may include: Problems with digestion, such as: Nausea or vomiting. Poor appetite. Bloating. Diarrhea or constipation. Trouble swallowing. Losing weight without trying to. Problems with the heart, blood, and lungs, such as: Dizziness, especially when standing up. Fainting. Shortness of breath. Irregular heartbeat. Bladder problems, such as: Trouble starting or stopping urination. Leaking urine. Trouble emptying the bladder. Urinary tract infections (UTIs). Problems with other body functions, such as: Sweat. You may sweat too much or too little. Temperature. You might get hot easily. Or, you might feel cold more than usual. Sexual function. Men may not be able to get or maintain an erection. Women may have vaginal dryness and difficulty with arousal. Focal neuropathy Symptoms affect only one area of the body. Common symptoms include: Numbness. Tingling. Burning pain. Prickling feeling. Very sensitive skin. Weakness. Inability to move (paralysis). Muscle twitching. Muscles getting smaller (wasting). Poor coordination. Double or blurred vision. Proximal neuropathy Sudden, severe pain in the hip, thigh, or buttocks. Pain may spread from the back into the legs (sciatica). Pain and numbness in the arms and legs. Tingling. Loss of bladder control or bowel control. Weakness and wasting of thigh muscles. Difficulty getting up from a seated position. Abdominal swelling. Unexplained weight loss. How is this  diagnosed? Diagnosis varies depending on the type   of neuropathy your health care provider suspects. Peripheral neuropathy Your health care provider will do a neurologic exam. This exam checks your reflexes, how you move, and what you can feel. You may have other tests, such as: Blood tests. Tests of the fluid that surrounds the spinal cord (lumbar puncture). CT scan. MRI. Checking the nerves that control muscles (electromyogram, or EMG). Checking how quickly signals pass through your nerves (nerve conduction study). Checking a small piece of a nerve using a microscope (biopsy). Autonomic neuropathy You may have tests, such as: Tests to measure your blood pressure and heart rate. You may be secured to an exam table that moves you from a lying position to an upright position (table tilt test). Breathing tests to check your lungs. Tests to check how food moves through the digestive system (gastric emptying tests). Blood, sweat, or urine tests. Ultrasound of your bladder. Spinal fluid tests. Focal neuropathy This condition may be diagnosed with: A neurologic exam. CT scan. MRI. EMG. Nerve conduction study. Proximal neuropathy There is no test to diagnose this type of neuropathy. You may have tests to rule out other possible causes of this type of neuropathy. Tests may include: X-rays of your spine and lumbar region. Lumbar puncture. MRI. How is this treated? The goal of treatment is to keep nerve damage from getting worse. Treatment may include: Following your diabetes management plan. This will help keep your blood glucose level and your A1C level within your target range. This is the most important treatment. Using prescription pain medicine. Follow these instructions at home: Diabetes management Follow your diabetes management plan as told by your health care provider. Check your blood glucose levels. Keep your blood glucose in your target range. Have your A1C level checked at  least two times a year, or as often as told. Take over the counter and prescription medicines only as told by your health care provider. This includes insulin and diabetes medicine.  Lifestyle  Do not use any products that contain nicotine or tobacco, such as cigarettes, e-cigarettes, and chewing tobacco. If you need help quitting, ask your health care provider. Be physically active every day. Include strength training and balance exercises. Follow a healthy meal plan. Work with your health care provider to manage your blood pressure. General instructions Ask your health care provider if the medicine prescribed to you requires you to avoid driving or using machinery. Check your skin and feet every day for cuts, bruises, redness, blisters, or sores. Keep all follow-up visits. This is important. Contact a health care provider if: You have burning, stabbing, or aching pain in your legs or feet. You are unable to feel pressure or pain in your feet. You develop problems with digestion, such as: Nausea. Vomiting. Bloating. Constipation. Diarrhea. Abdominal pain. You have difficulty with urination, such as: Inability to control when you urinate (incontinence). Inability to completely empty the bladder (retention). You feel as if your heart is racing (palpitations). You feel dizzy, weak, or faint when you stand up. Get help right away if: You cannot urinate. You have sudden weakness or loss of coordination. You have trouble speaking. You have pain or pressure in your chest. You have an irregular heartbeat. You have sudden inability to move a part of your body. These symptoms may represent a serious problem that is an emergency. Do not wait to see if the symptoms will go away. Get medical help right away. Call your local emergency services (911 in the [place] Do not drive yourself to   the hospital. Summary Diabetic neuropathy is nerve damage that is caused by diabetes. It can cause numbness  and pain in the arms, legs, digestive tract, heart, and other body systems. This condition is treated by keeping your blood glucose level and your A1C level within your target range. This can help prevent neuropathy from getting worse. Check your skin and feet every day for cuts, bruises, redness, blisters, or sores. Do not use any products that contain nicotine or tobacco, such as cigarettes, e-cigarettes, and chewing tobacco. This information is not intended to replace advice given to you by your health care provider. Make sure you discuss any questions you have with your health care provider. Document Revised: 05/10/2019 Document Reviewed: 05/10/2019 Elsevier Patient Education  2023 [employer]  

## 2022-05-17 ENCOUNTER — Encounter: Payer: Medicare Other | Attending: Nurse Practitioner | Admitting: Dietician

## 2022-05-17 ENCOUNTER — Other Ambulatory Visit: Payer: Self-pay

## 2022-05-17 ENCOUNTER — Encounter: Payer: Self-pay | Admitting: Dietician

## 2022-05-17 VITALS — Wt 194.0 lb

## 2022-05-17 DIAGNOSIS — E1165 Type 2 diabetes mellitus with hyperglycemia: Secondary | ICD-10-CM

## 2022-05-17 NOTE — Patient Instructions (Addendum)
Reach out with your case worker to see if your medicaid is active.  Your pharmacy was called.  The trulicity cost is $45 per month unless you are in the donut hole.  It would be closer to $11 per month with the medicaid.  Add vegetables to most meals.

## 2022-05-17 NOTE — Progress Notes (Signed)
Diabetes Self-Management Education  Visit Type: Follow-up  Appt. Start Time: 0807 Appt. End Time: 0835  05/17/2022  Christine Rocha, identified by name and date of birth, is a 67 y.o. female with a diagnosis of Diabetes:  .   ASSESSMENT Patient is here today alone.  She was last seen by this RD on 02/16/2022.  She states that she is struggling today as she states that she is worried about a lot of things. She has been checking her blood glucose twice daily. Fasting blood glucose 123-176 and 135-165 after dinner.  She states higher reading was when she ate candy. States that she has been off the Trulicity for about 2 months.  States that she received a letter that insurance does not cover this.  Called her pharmacy.  They stated a prior auth was required and this has been done.  The cost of the medication is $45/month.  Last MD note indicated to continue this.  Patient is unable to afford this.  She states that she has Medicaid but this benefit was not showing up.  After this visit, took patient to the [facility] office for assistance on this.  Reviewed with patient the importance of continuing to check her blood glucose and avoid sugary drinks.  Reminded patient of blood glucose of over 600 in the past off medication and importance of being careful about what she drinks, eats, and to take the medication if able.  History includes:  Type 2 Diabetes, HTN, arthritis, smokes Medications include:  Metformin (stopped), Lantus (stopped), Trulicity (stopped) Labs noted:  A1C 7% 03/12/2022, 7.1% 11/06/2021 decreased from 11.2% 05/13/2021, 7.3% 02/09/2021, eGFR 65, Cholesterol 116, HDL 35, LDL 23, Triglycerides 130 16/10/9602   Weight: 193 lbs 05/17/2022 183 lbs 11/16/2021 186 lbs 09/07/2021   182 lbs 06/16/2021 190 lbs 04/13/2021 (lost with uncontrolled glucose) 197 lbs 02/17/21 189 lbs 11/28/2020   Patient lives with her husband.  They share the shopping and cooking.  They eat out most of the time (fast  food) but has changed to TV dinners most frequently since diabetes diagnosis. They rely on public transportation. She states that some vegetables (broccoli, cauliflower, and squash s) don't agree with her - make her want to vomit.  She enjoys cabbage and turnip greens.  Prefers canned food rather than frozen. She is on disability.  Walks with a cane or walker.   She smokes and has tried to quit without success. Food insecurity and relies on food banks to supplement.  Skips dinner a couple times a week due to money.  She was given the shopping at the [employer] handout on previous visit. Food stamps but not enough to last ($23 per month).  She does use a food pantry.  Weight 194 lb (88 kg). Body mass index is 34.37 kg/m.   Diabetes Self-Management Education - 05/17/22 0841       Visit Information   Visit Type Follow-up      Psychosocial Assessment   What is the hardest part about your diabetes right now, causing you the most concern, or is the most worrisome to you about your diabetes?   Taking/obtaining medications;Making healty food and beverage choices    Self-care barriers Lack of material resources    Self-management support Doctor's office;CDE visits    Other persons present Patient    Patient Concerns Nutrition/Meal planning    Special Needs None    Preferred Learning Style No preference indicated    Learning Readiness Ready  Pre-Education Assessment   Patient understands the diabetes disease and treatment process. Comprehends key points    Patient understands incorporating nutritional management into lifestyle. Needs Review    Patient undertands incorporating physical activity into lifestyle. Comprehends key points    Patient understands using medications safely. Needs Review    Patient understands monitoring blood glucose, interpreting and using results Comprehends key points    Patient understands prevention, detection, and treatment of acute complications. Comprehends  key points    Patient understands prevention, detection, and treatment of chronic complications. Compreheands key points    Patient understands how to develop strategies to address psychosocial issues. Comprehends key points    Patient understands how to develop strategies to promote health/change behavior. Needs Review      Complications   Last HgB A1C per patient/outside source 7 %   03/12/2022   How often do you check your blood sugar? 1-2 times/day    Fasting Blood glucose range (mg/dL) 147-829;56-213    Postprandial Blood glucose range (mg/dL) 086-578      Dietary Intake   Breakfast Sausage, eggs, toast, 1% milk    Lunch Diet small frozen pizza    Dinner strawberries and watermelon, bologna sandwich on white bread with mayo    Beverage(s) Water, Zero sugar soda, small amounts OJ, 1% milk      Patient Education   Previous Diabetes Education Yes (please comment)   02/2022   Healthy Eating Plate Method    Medications Other (comment)   problem solved   Monitoring Identified appropriate SMBG and/or A1C goals.      Individualized Goals (developed by patient)   Nutrition General guidelines for healthy choices and portions discussed    Physical Activity Exercise 5-7 days per week;30 minutes per day    Medications take my medication as prescribed    Monitoring  Test my blood glucose as discussed    Problem Solving Addressing barriers to behavior change;Eating Pattern    Reducing Risk examine blood glucose patterns;do foot checks daily      Patient Self-Evaluation of Goals - Patient rates self as meeting previously set goals (% of time)   Nutrition 50 - 75 % (half of the time)    Physical Activity >75% (most of the time)    Medications < 25% (hardly ever/never)    Monitoring >75% (most of the time)    Problem Solving and behavior change strategies  50 - 75 % (half of the time)    Reducing Risk (treating acute and chronic complications) 50 - 75 % (half of the time)    Health Coping 50 -  75 % (half of the time)      Post-Education Assessment   Patient understands the diabetes disease and treatment process. Demonstrates understanding / competency    Patient understands incorporating nutritional management into lifestyle. Comprehends key points    Patient undertands incorporating physical activity into lifestyle. Demonstrates understanding / competency    Patient understands using medications safely. Demonstrates understanding / competency    Patient understands monitoring blood glucose, interpreting and using results Demonstrates understanding / competency    Patient understands prevention, detection, and treatment of acute complications. Demonstrates understanding / competency    Patient understands prevention, detection, and treatment of chronic complications. Demonstrates understanding / competency    Patient understands how to develop strategies to address psychosocial issues. Demonstrates understanding / competency    Patient understands how to develop strategies to promote health/change behavior. Comprehends key points  Outcomes   Expected Outcomes Demonstrated interest in learning but significant barriers to change    Future DMSE 2 months    Program Status Not Completed      Subsequent Visit   Since your last visit have you continued or begun to take your medications as prescribed? No    Since your last visit have you experienced any weight changes? Gain    Weight Gain (lbs) 10    Since your last visit, are you checking your blood glucose at least once a day? Yes             Individualized Plan for Diabetes Self-Management Training:   Learning Objective:  Patient will have a greater understanding of diabetes self-management. Patient education plan is to attend individual and/or group sessions per assessed needs and concerns.   Plan:   Patient Instructions  Reach out with your case worker to see if your medicaid is active.  Your pharmacy was called.   The trulicity cost is $45 per month unless you are in the donut hole.  It would be closer to $11 per month with the medicaid.  Add vegetables to most meals.  Expected Outcomes:  Demonstrated interest in learning but significant barriers to change  Education material provided:   If problems or questions, patient to contact team via:  Phone  Future DSME appointment: 2 months

## 2022-06-14 ENCOUNTER — Other Ambulatory Visit: Payer: Self-pay | Admitting: Nurse Practitioner

## 2022-06-14 ENCOUNTER — Other Ambulatory Visit: Payer: Self-pay

## 2022-06-14 ENCOUNTER — Ambulatory Visit: Payer: Medicare Other | Attending: Nurse Practitioner | Admitting: Nurse Practitioner

## 2022-06-14 ENCOUNTER — Encounter: Payer: Self-pay | Admitting: Nurse Practitioner

## 2022-06-14 VITALS — BP 155/89 | HR 73 | Ht 63.0 in | Wt 194.8 lb

## 2022-06-14 DIAGNOSIS — E785 Hyperlipidemia, unspecified: Secondary | ICD-10-CM

## 2022-06-14 DIAGNOSIS — R42 Dizziness and giddiness: Secondary | ICD-10-CM

## 2022-06-14 DIAGNOSIS — Z7985 Long-term (current) use of injectable non-insulin antidiabetic drugs: Secondary | ICD-10-CM

## 2022-06-14 DIAGNOSIS — I10 Essential (primary) hypertension: Secondary | ICD-10-CM

## 2022-06-14 DIAGNOSIS — D649 Anemia, unspecified: Secondary | ICD-10-CM | POA: Diagnosis not present

## 2022-06-14 DIAGNOSIS — E1165 Type 2 diabetes mellitus with hyperglycemia: Secondary | ICD-10-CM

## 2022-06-14 DIAGNOSIS — K5901 Slow transit constipation: Secondary | ICD-10-CM

## 2022-06-14 LAB — POCT GLYCOSYLATED HEMOGLOBIN (HGB A1C): HbA1c, POC (controlled diabetic range): 9.2 % — AB (ref 0.0–7.0)

## 2022-06-14 LAB — GLUCOSE, POCT (MANUAL RESULT ENTRY): POC Glucose: 181 mg/dl — AB (ref 70–99)

## 2022-06-14 MED ORDER — CONTOUR NEXT TEST VI STRP
ORAL_STRIP | 3 refills | Status: AC
  Filled 2022-06-14: qty 100, 33d supply, fill #0
  Filled 2022-07-12: qty 100, 33d supply, fill #1

## 2022-06-14 MED ORDER — ATENOLOL 50 MG PO TABS
50.0000 mg | ORAL_TABLET | Freq: Every day | ORAL | 1 refills | Status: DC
  Filled 2022-06-14 – 2022-07-29 (×2): qty 90, 90d supply, fill #0
  Filled 2022-10-22: qty 90, 90d supply, fill #1

## 2022-06-14 MED ORDER — AMLODIPINE BESYLATE 10 MG PO TABS
10.0000 mg | ORAL_TABLET | Freq: Every day | ORAL | 1 refills | Status: DC
  Filled 2022-06-14 – 2022-07-29 (×2): qty 90, 90d supply, fill #0
  Filled 2022-10-22: qty 90, 90d supply, fill #1

## 2022-06-14 MED ORDER — SITAGLIPTIN PHOSPHATE 25 MG PO TABS
25.0000 mg | ORAL_TABLET | Freq: Every day | ORAL | 1 refills | Status: DC
  Filled 2022-06-14: qty 90, 90d supply, fill #0

## 2022-06-14 MED ORDER — ROSUVASTATIN CALCIUM 10 MG PO TABS
10.0000 mg | ORAL_TABLET | Freq: Every day | ORAL | 1 refills | Status: DC
  Filled 2022-06-14 – 2022-07-29 (×3): qty 90, 90d supply, fill #0
  Filled 2022-10-22: qty 90, 90d supply, fill #1

## 2022-06-14 MED ORDER — SENNOSIDES-DOCUSATE SODIUM 8.6-50 MG PO TABS
1.0000 | ORAL_TABLET | Freq: Every day | ORAL | 3 refills | Status: AC
  Filled 2022-06-14: qty 180, 90d supply, fill #0

## 2022-06-14 MED ORDER — LISINOPRIL 20 MG PO TABS
20.0000 mg | ORAL_TABLET | Freq: Every day | ORAL | 1 refills | Status: DC
  Filled 2022-06-14 – 2022-07-12 (×2): qty 90, 90d supply, fill #0
  Filled 2022-10-05: qty 90, 90d supply, fill #1

## 2022-06-14 MED ORDER — OZEMPIC (0.25 OR 0.5 MG/DOSE) 2 MG/3ML ~~LOC~~ SOPN
0.5000 mg | PEN_INJECTOR | SUBCUTANEOUS | 2 refills | Status: DC
  Filled 2022-06-14: qty 3, 28d supply, fill #0
  Filled 2022-07-05 – 2022-07-12 (×2): qty 3, 28d supply, fill #1
  Filled 2022-08-04: qty 3, 28d supply, fill #2

## 2022-06-14 NOTE — Progress Notes (Signed)
Assessment & Plan:  Christine Rocha was seen today for diabetes and dizziness.  Diagnoses and all orders for this visit:  Type 2 diabetes mellitus with hyperglycemia, without long-term current use of insulin (HCC) -     POCT glucose (manual entry) -     POCT glycosylated hemoglobin (Hb A1C) -     CMP14+EGFR -     Microalbumin / creatinine urine ratio -     sitaGLIPtin (JANUVIA) 25 MG tablet; Take 1 tablet (25 mg total) by mouth daily. -     Semaglutide,0.25 or 0.5MG /DOS, (OZEMPIC, 0.25 OR 0.5 MG/DOSE,) 2 MG/3ML SOPN; Inject 0.5 mg into the skin once a week. -     glucose blood (CONTOUR NEXT TEST) test strip; Use as directed to check blood sugar 3 times daily.  Primary hypertension -     lisinopril (ZESTRIL) 20 MG tablet; Take 1 tablet (20 mg total) by mouth daily. -     atenolol (TENORMIN) 50 MG tablet; Take 1 tablet (50 mg total) by mouth daily. -     amLODipine (NORVASC) 10 MG tablet; Take 1 tablet (10 mg total) by mouth daily.  Anemia, unspecified type -     CBC with Differential  Dyslipidemia, goal LDL below 70 -     rosuvastatin (CRESTOR) 10 MG tablet; Take 1 tablet (10 mg total) by mouth daily. To lower cholesterol  Dizziness -     Urinalysis, Complete  Constipation due to slow transit -     senna-docusate (SENOKOT-S) 8.6-50 MG tablet; Take 1-2 tablets by mouth daily. Stool softener    Patient has been counseled on age-appropriate routine health concerns for screening and prevention. These are reviewed and up-to-date. Referrals have been placed accordingly. Immunizations are up-to-date or declined.    Subjective:   Chief Complaint  Patient presents with   Diabetes   Dizziness   HPI Christine Rocha 67 y.o. female presents to office today for follow-up to hypertension and diabetes.  She also has complaints of positional vertigo today.  She has a past medical history of arthritis, DM2, HPL, HTN and seizures.    Patient has been counseled on age-appropriate routine health  concerns for screening and prevention. These are reviewed and up-to-date. Referrals have been placed accordingly. Immunizations are up-to-date or declined.     Mammogram: UTD.  PAP SMEAR: UTD COLONSCOPY: UTD    HTN Blood pressure is not at goal today.  She states she has not taken any of her blood pressure medicines this morning due to rushing.  She continues to smoke but is cutting back. Associated symptoms: Dizziness  BP Readings from Last 3 Encounters:  06/14/22 (!) 155/89  05/12/22 (!) 150/74  03/15/22 128/60    DM 2 She has not been using Trulicity due to issues with insurance.  She does request to start Ozempic instead.  I did instruct her that I am unaware if this is covered by her insurance or not.  She stopped taking metformin last year due to GI upset.  She is checking her blood sugars twice per day and diabetes is poorly controlled at this time. Lab Results  Component Value Date   HGBA1C 9.2 (A) 06/14/2022     Vertigo Patient presents for evaluation of dizziness. The symptoms started about a month ago and have been well-controlled and progressed to a point and plateaued. The attacks occur intermittently and last a few seconds. Positions that worsen symptoms: any motion. Previous workup/treatments: none. Associated ear symptoms: hearing loss (right ear).  Associated CNS symptoms: none. Recent infections: none. Head trauma: denied. Drug ingestion: none. Noise exposure: no occupational exposure  GERD This has been associated with early satiety, fullness after meals, nausea, regurgitation of undigested food, symptoms primarily relate to meals, and lying down after meals, constipation and upper abdominal discomfort. Symptoms have been present for several months. She denies dysphagia.  She has not lost weight. She denies melena, hematochezia, hematemesis, and coffee ground emesis. Medical therapy in the past has included none.    Review of Systems  Constitutional:  Negative for fever,  malaise/fatigue and weight loss.  HENT:  Positive for hearing loss. Negative for nosebleeds.   Eyes: Negative.  Negative for blurred vision, double vision and photophobia.  Respiratory: Negative.  Negative for cough and shortness of breath.   Cardiovascular: Negative.  Negative for chest pain, palpitations and leg swelling.  Gastrointestinal:  Positive for constipation and heartburn. Negative for abdominal pain, diarrhea, melena, nausea and vomiting.  Musculoskeletal: Negative.  Negative for myalgias.  Neurological:  Positive for dizziness. Negative for focal weakness, seizures and headaches.  Psychiatric/Behavioral: Negative.  Negative for suicidal ideas.     Past Medical History:  Diagnosis Date   Arthritis    Right Knee   Diabetes mellitus without complication (HCC)    Hypertension    Seizures (HCC)     Past Surgical History:  Procedure Laterality Date   COLONOSCOPY  2021   ORIF FEMUR FRACTURE Left 04/24/2017   Procedure: OPEN REDUCTION INTERNAL FIXATION (ORIF) DISTAL FEMUR FRACTURE;  Surgeon: Nadara Mustard, MD;  Location: [facility] OR;  Service: Orthopedics;  Laterality: Left;    Family History  Problem Relation Age of Onset   Hypertension Mother    Breast cancer Mother    Hypertension Sister    Diabetes Sister     Social History Reviewed with no changes to be made today.   Outpatient Medications Prior to Visit  Medication Sig Dispense Refill   Multiple Vitamins-Minerals (WOMENS 50+ MULTI VITAMIN/MIN) TABS Take 1 tablet by mouth daily.     amLODipine (NORVASC) 10 MG tablet Take 1 tablet (10 mg total) by mouth daily. 90 tablet 1   atenolol (TENORMIN) 50 MG tablet Take 1 tablet (50 mg total) by mouth daily. 90 tablet 1   lisinopril (ZESTRIL) 20 MG tablet Take 1 tablet (20 mg total) by mouth daily. 90 tablet 1   rosuvastatin (CRESTOR) 10 MG tablet Take 1 tablet (10 mg total) by mouth daily. To lower cholesterol 90 tablet 1   Blood Glucose Monitoring Suppl (CONTOUR NEXT EZ)  w/Device KIT Use to check blood sugar once daily. 1 kit 0   hydrocortisone (ANUSOL-HC) 2.5 % rectal cream Place 1 application. rectally 2 (two) times daily. 60 g 1   Microlet Lancets MISC Use to check blood sugar once daily. 100 each 2   nystatin-triamcinolone ointment (MYCOLOG) Apply 1 application. topically 2 (two) times daily. As needed under breasts and under stomach 100 g 0   traZODone (DESYREL) 50 MG tablet Take 0.5-1 tablets (25-50 mg total) by mouth at bedtime as needed for sleep. 30 tablet 3   Dulaglutide (TRULICITY) 1.5 MG/0.5ML SOPN Inject 1.5 mg into the skin once a week. 6 mL 1   glucose blood (CONTOUR NEXT TEST) test strip Use as directed to check blood sugar 3 times daily. 100 strip 3   No facility-administered medications prior to visit.    Allergies  Allergen Reactions   Penicillins Nausea Only       Objective:  BP (!) 155/89 (BP Location: Left Arm, Patient Position: Sitting, Cuff Size: Normal)   Pulse 73   Wt 194 lb 12.8 oz (88.4 kg)   SpO2 98%   BMI 34.51 kg/m  Wt Readings from Last 3 Encounters:  06/14/22 194 lb 12.8 oz (88.4 kg)  05/17/22 194 lb (88 kg)  05/12/22 192 lb (87.1 kg)    Physical Exam Vitals and nursing note reviewed.  Constitutional:      Appearance: She is well-developed.  HENT:     Head: Normocephalic and atraumatic.     Right Ear: Tympanic membrane, ear canal and external ear normal. Decreased hearing noted.     Left Ear: Hearing, tympanic membrane, ear canal and external ear normal.  Cardiovascular:     Rate and Rhythm: Normal rate and regular rhythm.     Heart sounds: Normal heart sounds. No murmur heard.    No friction rub. No gallop.  Pulmonary:     Effort: Pulmonary effort is normal. No tachypnea or respiratory distress.     Breath sounds: Normal breath sounds. No decreased breath sounds, wheezing, rhonchi or rales.  Chest:     Chest wall: No tenderness.  Abdominal:     General: Bowel sounds are normal.     Palpations:  Abdomen is soft.  Musculoskeletal:        General: Normal range of motion.     Cervical back: Normal range of motion.  Skin:    General: Skin is warm and dry.  Neurological:     Mental Status: She is alert and oriented to person, place, and time.     Coordination: Coordination normal.  Psychiatric:        Behavior: Behavior normal. Behavior is cooperative.        Thought Content: Thought content normal.        Judgment: Judgment normal.          Patient has been counseled extensively about nutrition and exercise as well as the importance of adherence with medications and regular follow-up. The patient was given clear instructions to go to ER or return to medical center if symptoms don't improve, worsen or new problems develop. The patient verbalized understanding.   Follow-up: Return in about 4 weeks (around 07/12/2022) for BP CHECK WITH LUKE and meter check.   Claiborne Rigg, FNP-BC [facility] [place], [place] [phone]   06/14/2022, 11:55 AM

## 2022-06-15 LAB — CMP14+EGFR
ALT: 21 IU/L (ref 0–32)
AST: 15 IU/L (ref 0–40)
Albumin/Globulin Ratio: 2 (ref 1.2–2.2)
Albumin: 4.7 g/dL (ref 3.9–4.9)
Alkaline Phosphatase: 102 IU/L (ref 44–121)
BUN/Creatinine Ratio: 8 — ABNORMAL LOW (ref 12–28)
BUN: 7 mg/dL — ABNORMAL LOW (ref 8–27)
Bilirubin Total: <0.2 mg/dL (ref 0.0–1.2)
CO2: 23 mmol/L (ref 20–29)
Calcium: 9.9 mg/dL (ref 8.7–10.3)
Chloride: 103 mmol/L (ref 96–106)
Creatinine, Ser: 0.84 mg/dL (ref 0.57–1.00)
Globulin, Total: 2.3 g/dL (ref 1.5–4.5)
Glucose: 164 mg/dL — ABNORMAL HIGH (ref 70–99)
Potassium: 4.3 mmol/L (ref 3.5–5.2)
Sodium: 141 mmol/L (ref 134–144)
Total Protein: 7 g/dL (ref 6.0–8.5)
eGFR: 78 mL/min/{1.73_m2} (ref >59)

## 2022-06-15 LAB — MICROALBUMIN / CREATININE URINE RATIO
Creatinine, Urine: 106.4 mg/dL
Microalb/Creat Ratio: 13 mg/g creat (ref 0–29)
Microalbumin, Urine: 13.5 ug/mL

## 2022-06-15 LAB — CBC WITH DIFFERENTIAL/PLATELET
Basophils Absolute: 0.1 10*3/uL (ref 0.0–0.2)
Basos: 1 %
EOS (ABSOLUTE): 0.1 10*3/uL (ref 0.0–0.4)
Eos: 2 %
Hematocrit: 41.6 % (ref 34.0–46.6)
Hemoglobin: 13.7 g/dL (ref 11.1–15.9)
Immature Grans (Abs): 0 10*3/uL (ref 0.0–0.1)
Immature Granulocytes: 0 %
Lymphocytes Absolute: 2.1 10*3/uL (ref 0.7–3.1)
Lymphs: 41 %
MCH: 29.8 pg (ref 26.6–33.0)
MCHC: 32.9 g/dL (ref 31.5–35.7)
MCV: 91 fL (ref 79–97)
Monocytes Absolute: 0.3 10*3/uL (ref 0.1–0.9)
Monocytes: 6 %
Neutrophils Absolute: 2.7 10*3/uL (ref 1.4–7.0)
Neutrophils: 50 %
Platelets: 241 10*3/uL (ref 150–450)
RBC: 4.59 x10E6/uL (ref 3.77–5.28)
RDW: 12.3 % (ref 11.7–15.4)
WBC: 5.2 10*3/uL (ref 3.4–10.8)

## 2022-06-15 LAB — URINALYSIS, COMPLETE
Bilirubin, UA: NEGATIVE
Glucose, UA: NEGATIVE
Ketones, UA: NEGATIVE
Nitrite, UA: NEGATIVE
Protein,UA: NEGATIVE
RBC, UA: NEGATIVE
Specific Gravity, UA: 1.014 (ref 1.005–1.030)
Urobilinogen, Ur: 0.2 mg/dL (ref 0.2–1.0)
pH, UA: 6 (ref 5.0–7.5)

## 2022-06-15 LAB — MICROSCOPIC EXAMINATION
Bacteria, UA: NONE SEEN
Casts: NONE SEEN /lpf

## 2022-06-17 ENCOUNTER — Telehealth: Payer: Self-pay | Admitting: Podiatry

## 2022-06-17 NOTE — Telephone Encounter (Signed)
[facility] called they are no longer carrying the selection of shoes that patient picked out they are going to substitute [id number] in its place

## 2022-06-22 ENCOUNTER — Other Ambulatory Visit: Payer: Self-pay

## 2022-06-23 DIAGNOSIS — F330 Major depressive disorder, recurrent, mild: Secondary | ICD-10-CM | POA: Diagnosis not present

## 2022-06-25 ENCOUNTER — Other Ambulatory Visit: Payer: Self-pay

## 2022-07-05 ENCOUNTER — Other Ambulatory Visit: Payer: Self-pay

## 2022-07-09 ENCOUNTER — Other Ambulatory Visit: Payer: Self-pay

## 2022-07-12 ENCOUNTER — Encounter: Payer: Self-pay | Admitting: Pharmacist

## 2022-07-12 ENCOUNTER — Other Ambulatory Visit: Payer: Self-pay

## 2022-07-12 ENCOUNTER — Ambulatory Visit: Payer: Medicare Other | Attending: Family Medicine | Admitting: Pharmacist

## 2022-07-12 VITALS — BP 132/67 | HR 68 | Wt 189.8 lb

## 2022-07-12 DIAGNOSIS — E1165 Type 2 diabetes mellitus with hyperglycemia: Secondary | ICD-10-CM | POA: Diagnosis not present

## 2022-07-12 DIAGNOSIS — I10 Essential (primary) hypertension: Secondary | ICD-10-CM

## 2022-07-12 DIAGNOSIS — Z7985 Long-term (current) use of injectable non-insulin antidiabetic drugs: Secondary | ICD-10-CM

## 2022-07-12 DIAGNOSIS — Z23 Encounter for immunization: Secondary | ICD-10-CM | POA: Diagnosis not present

## 2022-07-12 DIAGNOSIS — Z2911 Encounter for prophylactic immunotherapy for respiratory syncytial virus (RSV): Secondary | ICD-10-CM | POA: Diagnosis not present

## 2022-07-12 NOTE — Progress Notes (Signed)
S:     No chief complaint on file.  68 y.o. female who presents for diabetes evaluation, education, and management. Patient arrives in good spirits and presents without any assistance. Patient was referred and last seen by Primary Care Provider, Bertram Denver, on 06/14/2022. BP was 155/89 at that visit. Additionally, A1c was 9.2`% (up from 7% prior). She was started on Ozempic.  PMH is significant for T2DM, HTN, OA, smoking.   Patient reports Diabetes is longstanding. She is tolerating Ozempic well. Denies any NV, abdominal pain. She has no complaints today.    Family/Social History:  Fhx: HTN, DM Tobacco: current 0.5 PPD smoker  Alcohol: complete abstinence since 2008  Current diabetes medications include: Ozempic 0.5 mg weekly, Januvia (not taking)  Current hypertension medications include: amlodipine 10 mg daily, atenolol 50 mg daily, lisinopril 20 mg Current hyperlipidemia medications include: rosuvastatin 10 mg daily Patient reports adherence to taking all medications as prescribed. She has all medications with her today except for Januvia.   Insurance coverage: BCBS  Patient denies hypoglycemic events.  Reported home fasting blood sugars: 104, 114, 162, 132, 110, 148, 115, 136  Reported 2 hour post-meal/random blood sugars: 148, 125, 132, 139, 118, 125, 142.  Patient denies nocturia (nighttime urination).  Patient denies neuropathy (nerve pain). Patient denies visual changes. Patient denies self foot exams.   Patient reported dietary habits:  -Admits that she's limiting fried foods and sweets. Admits that this may have contributed to her A1c prior.   Patient-reported exercise habits:  -Limited d/t OA and impaired gait  Reported BP values:  -SBP: 112 - 128. 1 outlier of 141. -DBP: 71-81  O:   ROS  Physical Exam  No CGM present.   Lab Results  Component Value Date   HGBA1C 9.2 (A) 06/14/2022   Vitals:   07/12/22 0850  BP: 132/67  Pulse: 68    Lipid  Panel     Component Value Date/Time   CHOL 116 10/20/2021 1007   TRIG 130 10/20/2021 1007   HDL 35 (L) 10/20/2021 1007   CHOLHDL 3.3 10/20/2021 1007   CHOLHDL 3.8 02/25/2016 1737   VLDL 33 (H) 02/25/2016 1737   LDLCALC 58 10/20/2021 1007    Clinical Atherosclerotic Cardiovascular Disease (ASCVD): No  The ASCVD Risk score (Arnett DK, et al., 2019) failed to calculate for the following reasons:   The valid total cholesterol range is 130 to 320 mg/dL   Patient is participating in a Managed Medicaid Plan: no   A/P: Diabetes longstanding currently uncontrolled based on A1c. However, she has adjusted her diet and her home sugars are better controlled. Patient is able to verbalize appropriate hypoglycemia management plan. Medication adherence appears appropriate. Will remove Januvia from her list. Of note, she ran out of Ozempic due to injection technique error. Will keep her on 0.5 mg weekly for now and see in 1 month for dose titration.  -Continued Ozempic 0.5 mg weekly.  -Stop Januvia.  -Patient educated on purpose, proper use, and potential adverse effects of Ozempic.  -Extensively discussed pathophysiology of diabetes, recommended lifestyle interventions, dietary effects on blood sugar control.  -Counseled on s/sx of and management of hypoglycemia.  -Next A1c anticipated 09/2022.   Hypertension longstanding currently at goal. Blood pressure goal of <130/80 mmHg. Medication adherence reported. -Continued amlodipine, atenolol, and lisinopril at current doses.  Written patient instructions provided. Patient verbalized understanding of treatment plan.  Total time in face to face counseling 30 minutes.    Follow-up:  Pharmacist in 1 month.  Butch Penny, PharmD, Patsy [place], CPP Clinical Pharmacist [facility] [phone]

## 2022-07-13 ENCOUNTER — Ambulatory Visit: Payer: Medicare Other | Admitting: Podiatry

## 2022-07-13 ENCOUNTER — Telehealth: Payer: Self-pay | Admitting: Podiatry

## 2022-07-13 DIAGNOSIS — E1142 Type 2 diabetes mellitus with diabetic polyneuropathy: Secondary | ICD-10-CM | POA: Diagnosis not present

## 2022-07-13 DIAGNOSIS — M79676 Pain in unspecified toe(s): Secondary | ICD-10-CM | POA: Diagnosis not present

## 2022-07-13 DIAGNOSIS — B351 Tinea unguium: Secondary | ICD-10-CM

## 2022-07-13 DIAGNOSIS — L84 Corns and callosities: Secondary | ICD-10-CM | POA: Diagnosis not present

## 2022-07-13 NOTE — Telephone Encounter (Signed)
Lvm for pt to call to schedule an appt to pick up diabetic shoes as they are in.

## 2022-07-13 NOTE — Progress Notes (Signed)
  Subjective:  Patient ID: Christine Rocha, female    DOB: [birth year],  MRN: [id number]  Christine Rocha presents to clinic today for: at risk foot care with history of diabetic neuropathy and callus(es) b/l feet and painful thick toenails that are difficult to trim. Painful toenails interfere with ambulation. Aggravating factors include wearing enclosed shoe gear. Pain is relieved with periodic professional debridement. Painful calluses are aggravated when weightbearing with and without shoegear. Pain is relieved with periodic professional debridement.  Chief Complaint  Patient presents with   Diabetes    [facility] BS - DIDN'T CHECK IT THIS MORNING A1C - 9 LVPCP - 06/14/22    PCP is Claiborne Rigg, NP.  Allergies  Allergen Reactions   Penicillins Nausea Only    Review of Systems: Negative except as noted in the HPI.  Objective: No changes noted in today's physical examination. There were no vitals filed for this visit.  Christine Rocha is a pleasant 67 y.o. female in NAD. AAO x 3.  Vascular Examination: Capillary refill time <3 seconds b/l LE. Palpable pedal pulses b/l LE. Digital hair decreased b/l. No pedal edema b/l. Skin temperature gradient WNL b/l. No varicosities b/l. No cyanosis or clubbing noted b/l LE.[name]  Dermatological Examination: Pedal skin with normal turgor, texture and tone b/l. No open wounds. No interdigital macerations b/l. Toenails 1-5 b/l thickened, discolored, dystrophic with subungual debris. There is pain on palpation to dorsal aspect of nailplates. Hyperkeratotic lesion(s) bilateral great toes.  No erythema, no edema, no drainage, no fluctuance..  Neurological Examination: Protective sensation diminished with 10g monofilament b/l.  Musculoskeletal Examination: Muscle strength 5/5 to all lower extremity muscle groups bilaterally. Pes planus deformity noted bilateral LE. Utilizes cane for ambulation assistance.     Latest Ref Rng & Units 06/14/2022    8:53 AM  03/12/2022    9:21 AM 09/17/2021    9:31 AM  Hemoglobin A1C  Hemoglobin-A1c 0.0 - 7.0 % 9.2  7.0  7.3    Assessment/Plan: 1. Pain due to onychomycosis of toenail   2. Callus   3. Diabetic peripheral neuropathy associated with type 2 diabetes mellitus (HCC)   -Patient was evaluated and treated. All patient's and/or POA's questions/concerns answered on today's visit. -Continue foot and shoe inspections daily. Monitor blood glucose per PCP/Endocrinologist's recommendations. -Patient to continue soft, supportive shoe gear daily. -Mycotic toenails 1-5 bilaterally were debrided in length and girth with sterile nail nippers and dremel without incident. -Callus(es) left great toe and right great toe pared utilizing sharp debridement with sterile blade without complication or incident. Total number debrided =2. -Patient/POA to call should there be question/concern in the interim.   Return in about 3 months (around 10/13/2022).  Freddie Breech, DPM

## 2022-07-18 ENCOUNTER — Encounter: Payer: Self-pay | Admitting: Podiatry

## 2022-07-21 ENCOUNTER — Other Ambulatory Visit: Payer: Self-pay

## 2022-07-23 NOTE — Telephone Encounter (Signed)
Lmom for pt to call and schedule picking up diabetic shoes  

## 2022-07-29 ENCOUNTER — Other Ambulatory Visit: Payer: Self-pay

## 2022-08-02 ENCOUNTER — Encounter: Payer: Medicare Other | Attending: Nurse Practitioner | Admitting: Dietician

## 2022-08-02 ENCOUNTER — Encounter: Payer: Self-pay | Admitting: Dietician

## 2022-08-02 VITALS — Wt 187.0 lb

## 2022-08-02 DIAGNOSIS — E1165 Type 2 diabetes mellitus with hyperglycemia: Secondary | ICD-10-CM | POA: Diagnosis not present

## 2022-08-02 NOTE — Patient Instructions (Signed)
Keep walking! Continue to take your medication as prescribed. Choose 1 Yahoo drink rather than 2.

## 2022-08-02 NOTE — Progress Notes (Signed)
Diabetes Self-Management Education  Visit Type: Follow-up  Appt. Start Time: 0905 Appt. End Time: 0925  08/02/2022  [name], identified by name and date of birth, is a 67 y.o. female with a diagnosis of Diabetes:  .   ASSESSMENT Patient is here today alone.  She was last seen by this RD on 05/17/2022. She states that she has a headache today. She keeps a log of her blood pressure and blood sugar. Fasting blood glucose:  102-133 and before bed:  103-131 She states that her last A1C was lower but she does not remember and I am unable to see this lab. She continues to take Ozempic. Working on decreasing smoking. She states that she is getting new dentures and states that she does not want to smoke after getting these.  History includes:  Type 2 Diabetes, HTN, arthritis, smokes Medications include:  Metformin (stopped), Lantus (stopped),  Ozempic Labs noted:  A1C 7% 03/12/2022, 7.1% 11/06/2021 decreased from 11.2% 05/13/2021, 7.3% 02/09/2021, eGFR 65, Cholesterol 116, HDL 35, LDL 23, Triglycerides 130 16/10/9602   Weight: 187 lbs 08/02/2022 193 lbs 05/17/2022 183 lbs 11/16/2021 186 lbs 09/07/2021   182 lbs 06/16/2021 190 lbs 04/13/2021 (lost with uncontrolled glucose) 197 lbs 02/17/21 189 lbs 11/28/2020   Patient lives with her husband.  They share the shopping and cooking.  They eat out most of the time (fast food) but has changed to TV dinners most frequently since diabetes diagnosis. They rely on public transportation. She states that some vegetables (broccoli, cauliflower, and squash s) don't agree with her - make her want to vomit.  She enjoys cabbage and turnip greens.  Prefers canned food rather than frozen. She is on disability.  Walks with a cane or walker.   She smokes and has tried to quit without success. Food insecurity and relies on food banks to supplement.  Skips dinner a couple times a week due to money.  She was given the shopping at the [employer] handout on previous  visit. Food stamps but not enough to last ($23 per month).  She does use a food pantry. Weight 187 lb (84.8 kg). Body mass index is 33.13 kg/m.   Diabetes Self-Management Education - 08/02/22 0832       Visit Information   Visit Type Follow-up      Psychosocial Assessment   Patient Belief/Attitude about Diabetes Motivated to manage diabetes    What is the hardest part about your diabetes right now, causing you the most concern, or is the most worrisome to you about your diabetes?   Making healty food and beverage choices;Taking/obtaining medications    Self-care barriers Lack of material resources    Self-management support Doctor's office    Other persons present Patient    Patient Concerns Nutrition/Meal planning    Special Needs None    Preferred Learning Style No preference indicated    Learning Readiness Ready      Pre-Education Assessment   Patient understands the diabetes disease and treatment process. Comprehends key points    Patient understands incorporating nutritional management into lifestyle. Needs Review    Patient undertands incorporating physical activity into lifestyle. Comprehends key points    Patient understands using medications safely. Comprehends key points    Patient understands monitoring blood glucose, interpreting and using results Comprehends key points    Patient understands prevention, detection, and treatment of acute complications. Comprehends key points    Patient understands prevention, detection, and treatment of chronic complications. Compreheands key  points    Patient understands how to develop strategies to address psychosocial issues. Comprehends key points    Patient understands how to develop strategies to promote health/change behavior. Needs Review      Complications   How often do you check your blood sugar? 1-2 times/day    Fasting Blood glucose range (mg/dL) 60-454    Postprandial Blood glucose range (mg/dL) 09-811      Dietary  Intake   Breakfast Oodles of Noodles    Dinner Salad with ham, lite ranch    Beverage(s) water, Yahoo chocolate milk, 1% milk      Activity / Exercise   Activity / Exercise Type Light (walking / raking leaves)    How many days per week do you exercise? 7    How many minutes per day do you exercise? 30    Total minutes per week of exercise 210      Patient Education   Previous Diabetes Education Yes (please comment)   05/2022   Healthy Eating Meal options for control of blood glucose level and chronic complications.    Being Active Role of exercise on diabetes management, blood pressure control and cardiac health.    Medications Reviewed patients medication for diabetes, action, purpose, timing of dose and side effects.    Monitoring Taught/evaluated CGM (comment)    Diabetes Stress and Support Identified and addressed patients feelings and concerns about diabetes;Worked with patient to identify barriers to care and solutions      Individualized Goals (developed by patient)   Nutrition General guidelines for healthy choices and portions discussed    Physical Activity Exercise 5-7 days per week;30 minutes per day    Medications take my medication as prescribed    Monitoring  Test my blood glucose as discussed    Problem Solving Eating Pattern;Addressing barriers to behavior change    Reducing Risk stop smoking;examine blood glucose patterns;do foot checks daily      Patient Self-Evaluation of Goals - Patient rates self as meeting previously set goals (% of time)   Nutrition 50 - 75 % (half of the time)    Physical Activity >75% (most of the time)    Medications 50 - 75 % (half of the time)    Monitoring >75% (most of the time)    Problem Solving and behavior change strategies  50 - 75 % (half of the time)    Reducing Risk (treating acute and chronic complications) 50 - 75 % (half of the time)    Health Coping 50 - 75 % (half of the time)      Post-Education Assessment   Patient  understands the diabetes disease and treatment process. Comprehends key points    Patient understands incorporating nutritional management into lifestyle. Needs Review    Patient undertands incorporating physical activity into lifestyle. Comprehends key points    Patient understands using medications safely. Comphrehends key points    Patient understands monitoring blood glucose, interpreting and using results Comprehends key points    Patient understands prevention, detection, and treatment of acute complications. Comprehends key points    Patient understands prevention, detection, and treatment of chronic complications. Comprehends key points    Patient understands how to develop strategies to address psychosocial issues. Comprehends key points    Patient understands how to develop strategies to promote health/change behavior. Needs Review      Outcomes   Expected Outcomes Demonstrated interest in learning. Expect positive outcomes    Future DMSE 3-4 months  Program Status Not Completed      Subsequent Visit   Since your last visit have you continued or begun to take your medications as prescribed? Yes   at times   Since your last visit have you experienced any weight changes? Loss    Weight Loss (lbs) 6    Since your last visit, are you checking your blood glucose at least once a day? Yes             Individualized Plan for Diabetes Self-Management Training:   Learning Objective:  Patient will have a greater understanding of diabetes self-management. Patient education plan is to attend individual and/or group sessions per assessed needs and concerns.   Plan:   Patient Instructions  Keep walking! Continue to take your medication as prescribed. Choose 1 Yahoo drink rather than 2.    Expected Outcomes:  Demonstrated interest in learning. Expect positive outcomes  Education material provided:   If problems or questions, patient to contact team via:  Phone  Future DSME  appointment: 3-4 months

## 2022-08-04 ENCOUNTER — Other Ambulatory Visit: Payer: Self-pay | Admitting: Internal Medicine

## 2022-08-04 ENCOUNTER — Other Ambulatory Visit: Payer: Self-pay

## 2022-08-04 DIAGNOSIS — E1169 Type 2 diabetes mellitus with other specified complication: Secondary | ICD-10-CM

## 2022-08-09 ENCOUNTER — Ambulatory Visit (INDEPENDENT_AMBULATORY_CARE_PROVIDER_SITE_OTHER): Payer: Medicare Other

## 2022-08-09 DIAGNOSIS — M2141 Flat foot [pes planus] (acquired), right foot: Secondary | ICD-10-CM

## 2022-08-09 DIAGNOSIS — M2142 Flat foot [pes planus] (acquired), left foot: Secondary | ICD-10-CM | POA: Diagnosis not present

## 2022-08-09 DIAGNOSIS — L84 Corns and callosities: Secondary | ICD-10-CM | POA: Diagnosis not present

## 2022-08-09 DIAGNOSIS — E1142 Type 2 diabetes mellitus with diabetic polyneuropathy: Secondary | ICD-10-CM

## 2022-08-09 NOTE — Progress Notes (Signed)

## 2022-08-13 ENCOUNTER — Other Ambulatory Visit: Payer: Self-pay

## 2022-08-16 ENCOUNTER — Other Ambulatory Visit: Payer: Self-pay

## 2022-08-16 ENCOUNTER — Ambulatory Visit: Payer: Medicare Other | Attending: Family Medicine | Admitting: Pharmacist

## 2022-08-16 ENCOUNTER — Encounter: Payer: Self-pay | Admitting: Pharmacist

## 2022-08-16 VITALS — BP 120/68 | HR 65

## 2022-08-16 DIAGNOSIS — E669 Obesity, unspecified: Secondary | ICD-10-CM | POA: Diagnosis not present

## 2022-08-16 DIAGNOSIS — E1169 Type 2 diabetes mellitus with other specified complication: Secondary | ICD-10-CM

## 2022-08-16 DIAGNOSIS — Z7985 Long-term (current) use of injectable non-insulin antidiabetic drugs: Secondary | ICD-10-CM | POA: Diagnosis not present

## 2022-08-16 NOTE — Progress Notes (Signed)
    S:     No chief complaint on file.  67 y.o. female who presents for diabetes evaluation, education, and management. Patient arrives in good spirits and presents without any assistance. Patient was referred and last seen by Primary Care Provider, Bertram Denver, on 06/14/2022. Pharmacy saw her 07/12/2022. We continued her Ozempic 0.5 mg and BP medications. BP was 132/67 mmHg at that visit. Additionally, she reported improved glycemic control with Ozempic. Home sugars were mostly at goal.   PMH is significant for T2DM, HTN, OA, smoking.   Today, patient reports doing well since last visit. She is tolerating Ozempic well. Denies any NV, abdominal pain. No changes in vision. She has no complaints today.    Family/Social History:  Fhx: HTN, DM Tobacco: current 0.5 PPD smoker  Alcohol: complete abstinence since 2008  Current diabetes medications include: Ozempic 0.5 mg weekly Current hypertension medications include: amlodipine 10 mg daily, atenolol 50 mg daily, lisinopril 20 mg Current hyperlipidemia medications include: rosuvastatin 10 mg daily Patient reports adherence to taking all medications as prescribed. She has NOT taken her BP medications this morning.   Insurance coverage: BCBS Medicare  Patient denies hypoglycemic events.  Reported home fasting blood sugars: 121 - high 100s (1 outlier of 210) Reported 2 hour post-meal/random blood sugars: 130s  Patient denies nocturia (nighttime urination).  Patient denies neuropathy (nerve pain). Patient denies visual changes. Patient denies self foot exams.   Patient reported dietary habits:  -Admits that she's limiting fried foods and sweets.   Patient-reported exercise habits:  -Limited d/t OA and impaired gait  Reported BP values:  -SBP: 128.  -DBP: 72  O:   ROS  Physical Exam  No CGM present.   Lab Results  Component Value Date   HGBA1C 9.2 (A) 06/14/2022   There were no vitals filed for this visit.   Lipid Panel      Component Value Date/Time   CHOL 116 10/20/2021 1007   TRIG 130 10/20/2021 1007   HDL 35 (L) 10/20/2021 1007   CHOLHDL 3.3 10/20/2021 1007   CHOLHDL 3.8 02/25/2016 1737   VLDL 33 (H) 02/25/2016 1737   LDLCALC 58 10/20/2021 1007    Clinical Atherosclerotic Cardiovascular Disease (ASCVD): No  The ASCVD Risk score (Arnett DK, et al., 2019) failed to calculate for the following reasons:   The valid total cholesterol range is 130 to 320 mg/dL   Patient is participating in a Managed Medicaid Plan: no   A/P: Diabetes longstanding currently uncontrolled based on A1c. However, she has adjusted her diet and her home sugars are better controlled. She's tolerating Ozempic well and endorses adherence. Patient is able to verbalize appropriate hypoglycemia management plan. Medication adherence appears appropriate. Will continue to titrate Ozempic.  -Increase Ozempic to 1.0 mg weekly.  -Patient educated on purpose, proper use, and potential adverse effects of Ozempic.  -Extensively discussed pathophysiology of diabetes, recommended lifestyle interventions, dietary effects on blood sugar control.  -Counseled on s/sx of and management of hypoglycemia.  -Next A1c anticipated 09/2022.   Hypertension longstanding currently at goal. Blood pressure goal of <130/80 mmHg. Medication adherence reported. -Continued amlodipine, atenolol, and lisinopril at current doses.  Written patient instructions provided. Patient verbalized understanding of treatment plan.  Total time in face to face counseling 30 minutes.    Follow-up:  Pharmacist in 1 month.  Butch Penny, PharmD, Patsy [place], CPP Clinical Pharmacist [facility] [phone]

## 2022-08-30 ENCOUNTER — Other Ambulatory Visit: Payer: Self-pay

## 2022-08-30 ENCOUNTER — Other Ambulatory Visit: Payer: Self-pay | Admitting: Nurse Practitioner

## 2022-08-30 DIAGNOSIS — E1165 Type 2 diabetes mellitus with hyperglycemia: Secondary | ICD-10-CM

## 2022-08-30 MED ORDER — OZEMPIC (0.25 OR 0.5 MG/DOSE) 2 MG/3ML ~~LOC~~ SOPN
0.5000 mg | PEN_INJECTOR | SUBCUTANEOUS | 2 refills | Status: DC
  Filled 2022-08-30: qty 3, 28d supply, fill #0
  Filled 2022-09-01: qty 9, 84d supply, fill #0

## 2022-09-01 ENCOUNTER — Other Ambulatory Visit: Payer: Self-pay

## 2022-09-26 NOTE — Progress Notes (Unsigned)
S:    67 y.o. female who presents for diabetes evaluation, education, and management. Patient arrives in *** good spirits and presents without *** any assistance. ***Patient is accompanied by ***.   Patient was referred and last seen by Primary Care Provider, Dr. ***, on ***.  *** Patient was referred by *** on ***. Patient was last seen by Primary Care Provider, Dr. ***, on ***.   PMH is significant for ***.  At last visit, ***.   Patient reports Diabetes was diagnosed in ***.   Family/Social History: ***  Current diabetes medications include: *** Current hypertension medications include: *** Current hyperlipidemia medications include: ***  Patient reports adherence to taking all medications as prescribed.  *** Patient denies adherence with medications, reports missing *** medications *** times per week, on average.  Do you feel that your medications are working for you? {YES NO:22349} Have you been experiencing any side effects to the medications prescribed? {YES NO:22349} Do you have any problems obtaining medications due to transportation or finances? {YES [id number] Insurance coverage: ***  Patient {Actions; denies-reports:120008} hypoglycemic events.  Reported home fasting blood sugars: ***  Reported 2 hour post-meal/random blood sugars: ***.  Patient {Actions; denies-reports:120008} nocturia (nighttime urination).  Patient {Actions; denies-reports:120008} neuropathy (nerve pain). Patient {Actions; denies-reports:120008} visual changes. Patient {Actions; denies-reports:120008} self foot exams.   Patient reported dietary habits: Eats *** meals/day Breakfast: *** Lunch: *** Dinner: *** Snacks: *** Drinks: ***  Within the past 12 months, did you worry whether your food would run out before you got money to buy more? {YES NO:22349} Within the past 12 months, did the food you bought run out, and you didn't have money to get more? {YES NO:22349} PHQ-9 Score:  ***  Patient-reported exercise habits: ***   O:   ROS  Physical Exam  7 day average blood glucose: ***  Libre3 *** CGM Download today *** on *** % Time CGM is active: ***% Average Glucose: *** mg/dL Glucose Management Indicator: ***  Glucose Variability: ***% (goal <36%) Time in Goal:  - Time in range 70-180: ***% - Time above range: ***% - Time below range: ***% Observed patterns:   Lab Results  Component Value Date   HGBA1C 9.2 (A) 06/14/2022   There were no vitals filed for this visit.  Lipid Panel     Component Value Date/Time   CHOL 116 10/20/2021 1007   TRIG 130 10/20/2021 1007   HDL 35 (L) 10/20/2021 1007   CHOLHDL 3.3 10/20/2021 1007   CHOLHDL 3.8 02/25/2016 1737   VLDL 33 (H) 02/25/2016 1737   LDLCALC 58 10/20/2021 1007    Clinical Atherosclerotic Cardiovascular Disease (ASCVD): {YES/NO:21197} The ASCVD Risk score (Arnett DK, et al., 2019) failed to calculate for the following reasons:   The valid total cholesterol range is 130 to 320 mg/dL   Patient is participating in a Managed Medicaid Plan:  {MM YES/NO:27447::"Yes"}   A/P: Diabetes longstanding *** currently ***. Patient is *** able to verbalize appropriate hypoglycemia management plan. Medication adherence appears ***. Control is suboptimal due to ***. -{Meds adjust:18428} basal insulin *** Lantus/Basaglar/Semglee (insulin glargine) *** Tresiba (insulin degludec) from *** units to *** units daily in the morning. Patient will continue to titrate 1 unit every *** days if fasting blood sugar > 100mg /dl until fasting blood sugars reach goal or next visit.  -{Meds adjust:18428} rapid insulin *** Novolog (insulin aspart) *** Humalog (insulin lispro) from *** to ***.  -{Meds adjust:18428} GLP-1 *** Trulicity (dulaglutide) *** Ozempic (semaglutide) ***  Mounjaro (tirzepatide) from *** mg to *** mg .  -{Meds adjust:18428} SGLT2-I *** Farxiga (dapagliflozin) *** Jardiance (empagliflozin) 10 mg. Counseled on  sick day rules. -{Meds adjust:18428} metformin ***.  -Patient educated on purpose, proper use, and potential adverse effects of ***.  -Extensively discussed pathophysiology of diabetes, recommended lifestyle interventions, dietary effects on blood sugar control.  -Counseled on s/sx of and management of hypoglycemia.  -Next A1c anticipated ***.   ASCVD risk - primary ***secondary prevention in patient with diabetes. Last LDL is *** not at goal of <16 *** mg/dL. ASCVD risk factors include *** and 10-year ASCVD risk score of ***. {Desc; low/moderate/high:110033} intensity statin indicated.  -{Meds adjust:18428} ***statin *** mg.   Hypertension longstanding *** currently ***. Blood pressure goal of <130/80 *** mmHg. Medication adherence ***. Blood pressure control is suboptimal due to ***. -{Meds adjust:18428} *** mg.  Written patient instructions provided. Patient verbalized understanding of treatment plan.  Total time in face to face counseling *** minutes.    Follow-up:  Pharmacist *** PCP clinic visit in *** Patient seen with ***

## 2022-09-27 ENCOUNTER — Other Ambulatory Visit: Payer: Self-pay | Admitting: Nurse Practitioner

## 2022-09-27 ENCOUNTER — Other Ambulatory Visit: Payer: Self-pay

## 2022-09-27 ENCOUNTER — Ambulatory Visit: Payer: Medicare Other | Attending: Nurse Practitioner | Admitting: Pharmacist

## 2022-09-27 VITALS — BP 120/68

## 2022-09-27 DIAGNOSIS — E1169 Type 2 diabetes mellitus with other specified complication: Secondary | ICD-10-CM | POA: Diagnosis not present

## 2022-09-27 DIAGNOSIS — I10 Essential (primary) hypertension: Secondary | ICD-10-CM

## 2022-09-27 DIAGNOSIS — E669 Obesity, unspecified: Secondary | ICD-10-CM

## 2022-09-27 DIAGNOSIS — E785 Hyperlipidemia, unspecified: Secondary | ICD-10-CM

## 2022-09-27 DIAGNOSIS — Z7985 Long-term (current) use of injectable non-insulin antidiabetic drugs: Secondary | ICD-10-CM

## 2022-09-27 LAB — POCT GLYCOSYLATED HEMOGLOBIN (HGB A1C): HbA1c, POC (controlled diabetic range): 6.8 % (ref 0.0–7.0)

## 2022-09-27 MED ORDER — SEMAGLUTIDE (1 MG/DOSE) 4 MG/3ML ~~LOC~~ SOPN
1.0000 mg | PEN_INJECTOR | SUBCUTANEOUS | 2 refills | Status: DC
  Filled 2022-09-27: qty 3, 28d supply, fill #0
  Filled 2022-10-15: qty 9, 84d supply, fill #0

## 2022-09-28 ENCOUNTER — Encounter: Payer: Self-pay | Admitting: Pharmacist

## 2022-09-28 LAB — LIPID PANEL
Chol/HDL Ratio: 2.5 ratio (ref 0.0–4.4)
Cholesterol, Total: 97 mg/dL — ABNORMAL LOW (ref 100–199)
HDL: 39 mg/dL — ABNORMAL LOW (ref >39)
LDL Chol Calc (NIH): 43 mg/dL (ref 0–99)
Triglycerides: 73 mg/dL (ref 0–149)
VLDL Cholesterol Cal: 15 mg/dL (ref 5–40)

## 2022-09-30 ENCOUNTER — Other Ambulatory Visit: Payer: Self-pay

## 2022-10-01 ENCOUNTER — Other Ambulatory Visit: Payer: Self-pay

## 2022-10-05 ENCOUNTER — Other Ambulatory Visit: Payer: Self-pay

## 2022-10-12 DIAGNOSIS — E119 Type 2 diabetes mellitus without complications: Secondary | ICD-10-CM | POA: Diagnosis not present

## 2022-10-13 ENCOUNTER — Ambulatory Visit: Payer: Medicare Other | Admitting: Podiatry

## 2022-10-13 DIAGNOSIS — L84 Corns and callosities: Secondary | ICD-10-CM | POA: Diagnosis not present

## 2022-10-13 DIAGNOSIS — B351 Tinea unguium: Secondary | ICD-10-CM

## 2022-10-13 DIAGNOSIS — E1142 Type 2 diabetes mellitus with diabetic polyneuropathy: Secondary | ICD-10-CM

## 2022-10-13 DIAGNOSIS — M79676 Pain in unspecified toe(s): Secondary | ICD-10-CM

## 2022-10-13 NOTE — Progress Notes (Signed)
  Subjective:  Patient ID: Christine Rocha, female    DOB: 10-11-1955,  MRN: [id number]  Christine Rocha presents to clinic today for: at risk foot care with history of diabetic neuropathy and callus(es) of both feet and painful thick toenails that are difficult to trim. Painful toenails interfere with ambulation. Aggravating factors include wearing enclosed shoe gear. Pain is relieved with periodic professional debridement. Painful calluses are aggravated when weightbearing with and without shoegear. Pain is relieved with periodic professional debridement. She continues to use Vick's Vapor Rub on toenails. Chief Complaint  Patient presents with   Diabetes    [facility] BS-[id number] A1C-6 PCPV-09/2022    PCP is Claiborne Rigg, NP.  Allergies  Allergen Reactions   Penicillins Nausea Only    Review of Systems: Negative except as noted in the HPI.  Objective: No changes noted in today's physical examination. There were no vitals filed for this visit.  Christine Rocha is a pleasant 67 y.o. female in NAD. AAO x 3.  Vascular Examination: Capillary refill time <3 seconds b/l LE. Palpable pedal pulses b/l LE. Digital hair decreased b/l. No pedal edema b/l. Skin temperature gradient WNL b/l. No varicosities b/l. [name]  Dermatological Examination: Pedal skin with normal turgor, texture and tone b/l. No open wounds. No interdigital macerations b/l. Toenails 1-5 b/l thickened, discolored, dystrophic with subungual debris. There is pain on palpation to dorsal aspect of nailplates. Hyperkeratotic lesion(s) bilateral great toes.  No erythema, no edema, no drainage, no fluctuance.  Neurological Examination: Protective sensation diminished with 10g monofilament b/l.  Musculoskeletal Examination: Muscle strength 5/5 to all lower extremity muscle groups bilaterally. DME: Cane. Pes planus deformity noted bilateral LE.     Latest Ref Rng & Units 09/27/2022    8:57 AM 06/14/2022    8:53 AM 03/12/2022    9:21 AM   Hemoglobin A1C  Hemoglobin-A1c 0.0 - 7.0 % 6.8  9.2  7.0    Assessment/Plan: 1. Pain due to onychomycosis of toenail   2. Callus   3. Diabetic peripheral neuropathy associated with type 2 diabetes mellitus (HCC)     -Consent given for treatment as described below: -Examined patient. -Continue foot and shoe inspections daily. Monitor blood glucose per PCP/Endocrinologist's recommendations. -Continue supportive shoe gear daily. -Toenails 1-5 b/l were debrided in length and girth with sterile nail nippers and dremel without iatrogenic bleeding.  -Callus(es) bilateral great toes pared utilizing sterile scalpel blade without complication or incident. Total number debrided =2. -Patient/POA to call should there be question/concern in the interim.   Return in about 3 months (around 01/13/2023).  Christine Rocha, DPM

## 2022-10-15 ENCOUNTER — Other Ambulatory Visit: Payer: Self-pay

## 2022-10-17 ENCOUNTER — Encounter: Payer: Self-pay | Admitting: Podiatry

## 2022-10-21 ENCOUNTER — Other Ambulatory Visit: Payer: Self-pay

## 2022-10-22 ENCOUNTER — Other Ambulatory Visit: Payer: Self-pay

## 2022-10-26 ENCOUNTER — Ambulatory Visit: Payer: Medicare Other | Admitting: Nurse Practitioner

## 2022-10-27 ENCOUNTER — Other Ambulatory Visit: Payer: Self-pay

## 2022-10-27 ENCOUNTER — Ambulatory Visit: Payer: Medicare Other | Admitting: Nurse Practitioner

## 2022-10-27 ENCOUNTER — Ambulatory Visit: Payer: Medicare Other | Attending: Nurse Practitioner | Admitting: Nurse Practitioner

## 2022-10-27 VITALS — BP 125/69 | HR 81 | Ht 63.0 in | Wt 190.2 lb

## 2022-10-27 DIAGNOSIS — B3789 Other sites of candidiasis: Secondary | ICD-10-CM

## 2022-10-27 DIAGNOSIS — E785 Hyperlipidemia, unspecified: Secondary | ICD-10-CM

## 2022-10-27 DIAGNOSIS — E1169 Type 2 diabetes mellitus with other specified complication: Secondary | ICD-10-CM

## 2022-10-27 DIAGNOSIS — Z780 Asymptomatic menopausal state: Secondary | ICD-10-CM

## 2022-10-27 DIAGNOSIS — E1165 Type 2 diabetes mellitus with hyperglycemia: Secondary | ICD-10-CM | POA: Diagnosis not present

## 2022-10-27 DIAGNOSIS — Z1231 Encounter for screening mammogram for malignant neoplasm of breast: Secondary | ICD-10-CM

## 2022-10-27 DIAGNOSIS — I10 Essential (primary) hypertension: Secondary | ICD-10-CM

## 2022-10-27 DIAGNOSIS — Z7985 Long-term (current) use of injectable non-insulin antidiabetic drugs: Secondary | ICD-10-CM

## 2022-10-27 MED ORDER — AMLODIPINE BESYLATE 10 MG PO TABS
10.0000 mg | ORAL_TABLET | Freq: Every day | ORAL | 1 refills | Status: DC
  Filled 2022-10-27 – 2023-01-20 (×2): qty 90, 90d supply, fill #0

## 2022-10-27 MED ORDER — ATENOLOL 50 MG PO TABS
50.0000 mg | ORAL_TABLET | Freq: Every day | ORAL | 1 refills | Status: DC
  Filled 2022-10-27 – 2023-01-20 (×2): qty 90, 90d supply, fill #0

## 2022-10-27 MED ORDER — ROSUVASTATIN CALCIUM 10 MG PO TABS
10.0000 mg | ORAL_TABLET | Freq: Every day | ORAL | 1 refills | Status: DC
  Filled 2022-10-27 – 2023-01-20 (×2): qty 90, 90d supply, fill #0

## 2022-10-27 MED ORDER — MICROLET LANCETS MISC
2 refills | Status: AC
  Filled 2022-10-27: qty 100, 33d supply, fill #0

## 2022-10-27 MED ORDER — SEMAGLUTIDE (1 MG/DOSE) 4 MG/3ML ~~LOC~~ SOPN
1.0000 mg | PEN_INJECTOR | SUBCUTANEOUS | 2 refills | Status: DC
  Filled 2022-10-27 – 2023-01-20 (×2): qty 3, 28d supply, fill #0
  Filled 2023-02-15 (×2): qty 3, 28d supply, fill #1
  Filled 2023-03-14: qty 3, 28d supply, fill #2

## 2022-10-27 MED ORDER — LISINOPRIL 20 MG PO TABS
20.0000 mg | ORAL_TABLET | Freq: Every day | ORAL | 1 refills | Status: DC
  Filled 2022-10-27 – 2023-01-20 (×2): qty 90, 90d supply, fill #0

## 2022-10-27 NOTE — Progress Notes (Signed)
Assessment & Plan:  Christine Rocha was seen today for medical management of chronic issues.  Diagnoses and all orders for this visit:  Primary hypertension -     amLODipine (NORVASC) 10 MG tablet; Take 1 tablet (10 mg total) by mouth daily. -     atenolol (TENORMIN) 50 MG tablet; Take 1 tablet (50 mg total) by mouth daily. -     lisinopril (ZESTRIL) 20 MG tablet; Take 1 tablet (20 mg total) by mouth daily. Continue all antihypertensives as prescribed.  Reminded to bring in blood pressure log for follow  up appointment.  RECOMMENDATIONS: DASH/Mediterranean Diets are healthier choices for HTN.    Dyslipidemia, goal LDL below 70 -     rosuvastatin (CRESTOR) 10 MG tablet; Take 1 tablet (10 mg total) by mouth daily. To lower cholesterol INSTRUCTIONS: Work on a low fat, heart healthy diet and participate in regular aerobic exercise program by working out at least 150 minutes per week; 5 days a week-30 minutes per day. Avoid red meat/beef/steak,  fried foods. junk foods, sodas, sugary drinks, unhealthy snacking, alcohol and smoking.  Drink at least 80 oz of water per day and monitor your carbohydrate intake daily.    Type 2 diabetes mellitus with hyperglycemia, without long-term current use of insulin (HCC) -     Microlet Lancets MISC; Use to check blood sugar once daily. -     Semaglutide, 1 MG/DOSE, 4 MG/3ML SOPN; Inject 1 mg as directed once a week. Continue blood sugar control as discussed in office today, low carbohydrate diet, and regular physical exercise as tolerated, 150 minutes per week (30 min each day, 5 days per week, or 50 min 3 days per week). Keep blood sugar logs with fasting goal of 90-130 mg/dl, post prandial (after you eat) less than 180.  For Hypoglycemia: BS <60 and Hyperglycemia BS >400; contact the clinic ASAP. Annual eye exams and foot exams are recommended.   Breast cancer screening by mammogram -     MM 3D SCREENING MAMMOGRAM BILATERAL BREAST; Future  Postmenopausal estrogen  deficiency -     DG Bone Density; Future    Patient has been counseled on age-appropriate routine health concerns for screening and prevention. These are reviewed and up-to-date. Referrals have been placed accordingly. Immunizations are up-to-date or declined.    Subjective:   Chief Complaint  Patient presents with   Medical Management of Chronic Issues   HPI Christine Rocha 67 y.o. female presents to office today for follow up to HTN   She has a past medical history of arthritis, DM2, HPL, HTN and seizures.    Patient has been counseled on age-appropriate routine health concerns for screening and prevention. These are reviewed and up-to-date. Referrals have been placed accordingly. Immunizations are up-to-date or declined.     Mammogram: UTD.  PAP SMEAR: UTD COLONSCOPY: UTD     HTN  Blood pressure is well controlled. She is currently prescribed amlodipine 10 mg daily, atenolol 50 mg daily, and lisinopril 20 mg daily.  BP Readings from Last 3 Encounters:  10/27/22 125/69  09/28/22 120/68  08/16/22 120/68     Review of Systems  Constitutional:  Negative for fever, malaise/fatigue and weight loss.  HENT: Negative.  Negative for nosebleeds.   Eyes: Negative.  Negative for blurred vision, double vision and photophobia.  Respiratory: Negative.  Negative for cough and shortness of breath.   Cardiovascular: Negative.  Negative for chest pain, palpitations and leg swelling.  Gastrointestinal: Negative.  Negative for  heartburn, nausea and vomiting.  Musculoskeletal: Negative.  Negative for myalgias.  Neurological: Negative.  Negative for dizziness, focal weakness, seizures and headaches.  Psychiatric/Behavioral: Negative.  Negative for suicidal ideas.     Past Medical History:  Diagnosis Date   Arthritis    Right Knee   Diabetes mellitus without complication (HCC)    Hypertension    Seizures (HCC)     Past Surgical History:  Procedure Laterality Date   COLONOSCOPY  2021    ORIF FEMUR FRACTURE Left 04/24/2017   Procedure: OPEN REDUCTION INTERNAL FIXATION (ORIF) DISTAL FEMUR FRACTURE;  Surgeon: Nadara Mustard, MD;  Location: [facility] OR;  Service: Orthopedics;  Laterality: Left;    Family History  Problem Relation Age of Onset   Hypertension Mother    Breast cancer Mother    Hypertension Sister    Diabetes Sister     Social History Reviewed with no changes to be made today.   Outpatient Medications Prior to Visit  Medication Sig Dispense Refill   Blood Glucose Monitoring Suppl (CONTOUR NEXT EZ) w/Device KIT Use to check blood sugar once daily. 1 kit 0   glucose blood (CONTOUR NEXT TEST) test strip Use as directed to check blood sugar 3 times daily. 200 strip 3   hydrocortisone (ANUSOL-HC) 2.5 % rectal cream Place 1 application. rectally 2 (two) times daily. 60 g 1   Multiple Vitamins-Minerals (WOMENS 50+ MULTI VITAMIN/MIN) TABS Take 1 tablet by mouth daily.     nystatin-triamcinolone ointment (MYCOLOG) Apply 1 application. topically 2 (two) times daily. As needed under breasts and under stomach 100 g 0   senna-docusate (SENOKOT-S) 8.6-50 MG tablet Take 1-2 tablets by mouth daily. Stool softener 180 tablet 3   traZODone (DESYREL) 50 MG tablet Take 0.5-1 tablets (25-50 mg total) by mouth at bedtime as needed for sleep. 30 tablet 3   amLODipine (NORVASC) 10 MG tablet Take 1 tablet (10 mg total) by mouth daily. 90 tablet 1   atenolol (TENORMIN) 50 MG tablet Take 1 tablet (50 mg total) by mouth daily. 90 tablet 1   lisinopril (ZESTRIL) 20 MG tablet Take 1 tablet (20 mg total) by mouth daily. 90 tablet 1   Microlet Lancets MISC Use to check blood sugar once daily. 100 each 2   rosuvastatin (CRESTOR) 10 MG tablet Take 1 tablet (10 mg total) by mouth daily. To lower cholesterol 90 tablet 1   Semaglutide, 1 MG/DOSE, 4 MG/3ML SOPN Inject 1 mg as directed once a week. 3 mL 2   No facility-administered medications prior to visit.    Allergies  Allergen Reactions    Penicillins Nausea Only       Objective:    BP 125/69 (BP Location: Left Arm, Patient Position: Sitting, Cuff Size: Normal)   Pulse 81   Ht 5\' 3"  (1.6 m)   Wt 190 lb 3.2 oz (86.3 kg)   SpO2 97%   BMI 33.69 kg/m  Wt Readings from Last 3 Encounters:  10/27/22 190 lb 3.2 oz (86.3 kg)  08/02/22 187 lb (84.8 kg)  07/12/22 189 lb 12.8 oz (86.1 kg)    Physical Exam Vitals and nursing note reviewed.  Constitutional:      Appearance: She is well-developed.  HENT:     Head: Normocephalic and atraumatic.  Cardiovascular:     Rate and Rhythm: Normal rate and regular rhythm.     Heart sounds: Normal heart sounds. No murmur heard.    No friction rub. No gallop.  Pulmonary:  Effort: Pulmonary effort is normal. No tachypnea or respiratory distress.     Breath sounds: Normal breath sounds. No decreased breath sounds, wheezing, rhonchi or rales.  Chest:     Chest wall: No tenderness.  Abdominal:     General: Bowel sounds are normal.     Palpations: Abdomen is soft.  Musculoskeletal:        General: Normal range of motion.     Cervical back: Normal range of motion.  Skin:    General: Skin is warm and dry.  Neurological:     Mental Status: She is alert and oriented to person, place, and time.     Coordination: Coordination normal.  Psychiatric:        Behavior: Behavior normal. Behavior is cooperative.        Thought Content: Thought content normal.        Judgment: Judgment normal.          Patient has been counseled extensively about nutrition and exercise as well as the importance of adherence with medications and regular follow-up. The patient was given clear instructions to go to ER or return to medical center if symptoms don't improve, worsen or new problems develop. The patient verbalized understanding.   Follow-up: Return in about 3 months (around 01/27/2023).   Claiborne Rigg, FNP-BC [facility] [place], [place] [phone]    11/14/2022, 9:24 PM

## 2022-11-08 ENCOUNTER — Ambulatory Visit: Payer: Medicare Other | Admitting: Dietician

## 2022-11-08 ENCOUNTER — Other Ambulatory Visit: Payer: Self-pay

## 2022-11-12 DIAGNOSIS — E119 Type 2 diabetes mellitus without complications: Secondary | ICD-10-CM | POA: Diagnosis not present

## 2022-11-14 ENCOUNTER — Encounter: Payer: Self-pay | Admitting: Nurse Practitioner

## 2022-12-07 ENCOUNTER — Other Ambulatory Visit: Payer: Self-pay

## 2022-12-12 DIAGNOSIS — E119 Type 2 diabetes mellitus without complications: Secondary | ICD-10-CM | POA: Diagnosis not present

## 2022-12-15 ENCOUNTER — Other Ambulatory Visit: Payer: Self-pay

## 2022-12-17 ENCOUNTER — Other Ambulatory Visit: Payer: Self-pay

## 2022-12-21 ENCOUNTER — Ambulatory Visit: Payer: Medicare Other | Admitting: Dietician

## 2022-12-22 ENCOUNTER — Other Ambulatory Visit: Payer: Self-pay

## 2023-01-06 ENCOUNTER — Other Ambulatory Visit: Payer: Self-pay

## 2023-01-12 DIAGNOSIS — E119 Type 2 diabetes mellitus without complications: Secondary | ICD-10-CM | POA: Diagnosis not present

## 2023-01-13 ENCOUNTER — Telehealth: Payer: Self-pay

## 2023-01-13 NOTE — Telephone Encounter (Signed)
 Called pt to resch. Appt left vm. Nicki Guadalajara will be out of office

## 2023-01-14 ENCOUNTER — Other Ambulatory Visit: Payer: Self-pay

## 2023-01-20 ENCOUNTER — Encounter: Payer: Self-pay | Admitting: Dietician

## 2023-01-20 ENCOUNTER — Other Ambulatory Visit: Payer: Self-pay

## 2023-01-20 ENCOUNTER — Encounter: Payer: Medicare Other | Attending: Internal Medicine | Admitting: Dietician

## 2023-01-20 DIAGNOSIS — E119 Type 2 diabetes mellitus without complications: Secondary | ICD-10-CM | POA: Diagnosis not present

## 2023-01-20 NOTE — Progress Notes (Signed)
 Diabetes Self-Management Education  Visit Type: Follow-up  Appt. Start Time: 1115  Appt End Time: 1145  01/20/2023  [name], identified by name and date of birth, is a 68 y.o. female with a diagnosis of Diabetes:  .   ASSESSMENT Patient is here today alone.  She was last seen by this RD on 08/02/2022.  She is now using a [employer] blood glucose meter. Fasting blood glucose 120 this am and <120 after meals most of the time Trying to have more variety in her diet Eating canned fruit and draining the juice She has decreased her smoking to 3 cigarettes per day and states that she will not buy more cigarettes when these are gone. She has new dentures and this motivates her to quit smoking. Continues to walk her dog daily on warmer days. She continues to take the Ozempic  but is unsure if this will be covered in this new year.  Discussed for her to discuss Ozempic  with pharmacy.  History includes:  Type 2 Diabetes, HTN, arthritis, smokes Medications include:  Metformin  (stopped), Lantus  (stopped),  Ozempic  Labs noted:  A1C 7% 03/12/2022, 7.1% 11/06/2021 decreased from 11.2% 05/13/2021, 7.3% 02/09/2021, eGFR 65, Cholesterol 116, HDL 35, LDL 23, Triglycerides 130 89/89/7976   Weight: 191 lbs 01/18/2022 187 lbs 08/02/2022 193 lbs 05/17/2022 183 lbs 11/16/2021 186 lbs 09/07/2021   182 lbs 06/16/2021 190 lbs 04/13/2021 (lost with uncontrolled glucose) 197 lbs 02/17/21 189 lbs 11/28/2020   Patient lives with her husband.  They share the shopping and cooking.  They eat out most of the time (fast food) but has changed to TV dinners most frequently since diabetes diagnosis. They rely on public transportation. She states that some vegetables (broccoli, cauliflower, and squash s) don't agree with her - make her want to vomit.  She enjoys cabbage and turnip greens.  Prefers canned food rather than frozen. She is on disability.  Walks with a cane or walker.   She smokes and has tried to quit without  success. Food insecurity and relies on food banks to supplement.  Skips dinner a couple times a week due to money.  She was given the shopping at the [employer] tree handout on previous visit. Food stamps but not enough to last ($23 per month).  She does use a food pantry. Husband is trying to finalize retirement and reduce his work.  There were no vitals taken for this visit. There is no height or weight on file to calculate BMI.   Diabetes Self-Management Education - 01/20/23 1439       Visit Information   Visit Type Follow-up      Psychosocial Assessment   Patient Belief/Attitude about Diabetes Motivated to manage diabetes    What is the hardest part about your diabetes right now, causing you the most concern, or is the most worrisome to you about your diabetes?   Taking/obtaining medications;Making healty food and beverage choices    Self-care barriers Low literacy    Self-management support Doctor's office;CDE visits    Other persons present Patient    Patient Concerns Nutrition/Meal planning;Weight Control;Healthy Lifestyle;Glycemic Control    Special Needs None    Preferred Learning Style No preference indicated    Learning Readiness Ready    How often do you need to have someone help you when you read instructions, pamphlets, or other written materials from your doctor or pharmacy? 1 - Never      Complications   How often do you check your  blood sugar? 1-2 times/day    Fasting Blood glucose range (mg/dL) 29-870    Postprandial Blood glucose range (mg/dL) 29-870    Number of hypoglycemic episodes per month 0      Dietary Intake   Breakfast 3 deviled eggs    Lunch salad with egg, cheese, ham, fruit    Dinner baked chicken, peas, carrots or greens, fruit OR frozen dinner ([place] steak, potatoes, mac and cheese, greens, gravy, apples    Beverage(s) Water, Yahoo drink 3 times per day, 1% milk, 2 diet soda per week      Activity / Exercise   Activity / Exercise Type Light  (walking / raking leaves)    How many days per week do you exercise? 5    How many minutes per day do you exercise? 30    Total minutes per week of exercise 150      Patient Education   Previous Diabetes Education Yes (please comment)   07/2022   Healthy Eating Meal options for control of blood glucose level and chronic complications.    Being Active Role of exercise on diabetes management, blood pressure control and cardiac health.    Medications Reviewed patients medication for diabetes, action, purpose, timing of dose and side effects.    Monitoring Taught/evaluated SMBG meter.    Diabetes Stress and Support Identified and addressed patients feelings and concerns about diabetes;Worked with patient to identify barriers to care and solutions      Individualized Goals (developed by patient)   Nutrition General guidelines for healthy choices and portions discussed    Physical Activity Exercise 5-7 days per week;30 minutes per day    Medications take my medication as prescribed    Monitoring  Test my blood glucose as discussed    Problem Solving Medication consistency    Reducing Risk examine blood glucose patterns;do foot checks daily      Patient Self-Evaluation of Goals - Patient rates self as meeting previously set goals (% of time)   Nutrition >75% (most of the time)    Physical Activity >75% (most of the time)    Medications >75% (most of the time)    Monitoring >75% (most of the time)    Problem Solving and behavior change strategies  >75% (most of the time)    Reducing Risk (treating acute and chronic complications) >75% (most of the time)    Health Coping >75% (most of the time)      Post-Education Assessment   Patient understands the diabetes disease and treatment process. Demonstrates understanding / competency    Patient understands incorporating nutritional management into lifestyle. Comprehends key points    Patient undertands incorporating physical activity into  lifestyle. Demonstrates understanding / competency    Patient understands using medications safely. Demonstrates understanding / competency    Patient understands monitoring blood glucose, interpreting and using results Demonstrates understanding / competency    Patient understands prevention, detection, and treatment of acute complications. Demonstrates understanding / competency    Patient understands prevention, detection, and treatment of chronic complications. Demonstrates understanding / competency    Patient understands how to develop strategies to address psychosocial issues. Comprehends key points    Patient understands how to develop strategies to promote health/change behavior. Comprehends key points      Outcomes   Expected Outcomes Demonstrated interest in learning. Expect positive outcomes    Future DMSE 6 months    Program Status Not Completed      Subsequent Visit  Since your last visit have you continued or begun to take your medications as prescribed? Yes    Since your last visit have you experienced any weight changes? Gain    Weight Gain (lbs) 3    Since your last visit, are you checking your blood glucose at least once a day? Yes             Individualized Plan for Diabetes Self-Management Training:   Learning Objective:  Patient will have a greater understanding of diabetes self-management. Patient education plan is to attend individual and/or group sessions per assessed needs and concerns.   Plan:   Patient Instructions  Consider chocolate almond milk rather than Yahoo as the almond milk has less calories and carbohydrates  Continue to stay active  Great job on adding more variety to your diet and eating more vegetables!  Keep taking your medications as prescribed Continue to check your blood glucose daily as recommended  Great job on reducing your smoking!  Expected Outcomes:  Demonstrated interest in learning. Expect positive outcomes  Education  material provided:   If problems or questions, patient to contact team via:  Phone  Future DSME appointment: 6 months

## 2023-01-20 NOTE — Patient Instructions (Addendum)
 Consider chocolate almond milk rather than Yahoo as the almond milk has less calories and carbohydrates  Continue to stay active  Great job on adding more variety to your diet and eating more vegetables!  Keep taking your medications as prescribed Continue to check your blood glucose daily as recommended  Great job on reducing your smoking!

## 2023-01-21 ENCOUNTER — Other Ambulatory Visit: Payer: Self-pay

## 2023-01-24 ENCOUNTER — Other Ambulatory Visit: Payer: Self-pay

## 2023-01-26 ENCOUNTER — Ambulatory Visit: Payer: Medicare Other | Admitting: Podiatry

## 2023-01-31 ENCOUNTER — Other Ambulatory Visit: Payer: Medicare Other

## 2023-02-02 ENCOUNTER — Ambulatory Visit: Payer: Medicare Other | Admitting: Nurse Practitioner

## 2023-02-07 ENCOUNTER — Ambulatory Visit: Payer: Medicare Other

## 2023-02-07 DIAGNOSIS — E1142 Type 2 diabetes mellitus with diabetic polyneuropathy: Secondary | ICD-10-CM

## 2023-02-07 NOTE — Progress Notes (Signed)
Patient presents to the office today for diabetic shoe and insole measuring.  Patient was measured with brannock device to determine size and width for 1 pair of extra depth shoes and foam casted for 3 pair of insoles.   Documentation of medical necessity will be sent to patient's treating diabetic doctor to verify and sign.   Patient's diabetic provider: Z. Flemming / Rosey Bath MD   Shoes and insoles will be ordered at that time and patient will be notified for an appointment for fitting when they arrive.   Shoe size (per patient): 9 Brannock measurement: 9 Patient shoe selection- Shoe choice:   [id number] / [id number]

## 2023-02-12 DIAGNOSIS — E119 Type 2 diabetes mellitus without complications: Secondary | ICD-10-CM | POA: Diagnosis not present

## 2023-02-15 ENCOUNTER — Ambulatory Visit
Admission: RE | Admit: 2023-02-15 | Discharge: 2023-02-15 | Disposition: A | Discharge status: Home / Self Care | Payer: Medicare Other | Source: Ambulatory Visit | Attending: Nurse Practitioner | Admitting: Nurse Practitioner

## 2023-02-15 ENCOUNTER — Other Ambulatory Visit: Payer: Self-pay

## 2023-02-15 ENCOUNTER — Ambulatory Visit: Payer: Medicare Other

## 2023-02-15 DIAGNOSIS — Z1231 Encounter for screening mammogram for malignant neoplasm of breast: Secondary | ICD-10-CM

## 2023-02-18 ENCOUNTER — Encounter: Payer: Self-pay | Admitting: Nurse Practitioner

## 2023-03-07 ENCOUNTER — Telehealth: Payer: Self-pay

## 2023-03-07 NOTE — Telephone Encounter (Signed)
 Unable to reach patient by phone  to schedule virtual appointment with Dr. Alvis Lemmings for diabetic shoes.

## 2023-03-11 ENCOUNTER — Telehealth: Payer: Self-pay

## 2023-03-11 NOTE — Telephone Encounter (Signed)
 I have tried to contact Christine Rocha several times to set up an virtual appointment to discuss her need for diabetic shoes unsuccessfully x3. Voicemails have been left and we are now just waiting to contact the patient. Letter will be sent.

## 2023-03-12 DIAGNOSIS — E119 Type 2 diabetes mellitus without complications: Secondary | ICD-10-CM | POA: Diagnosis not present

## 2023-03-14 ENCOUNTER — Other Ambulatory Visit: Payer: Self-pay

## 2023-03-17 ENCOUNTER — Other Ambulatory Visit (HOSPITAL_COMMUNITY): Payer: Self-pay

## 2023-03-23 ENCOUNTER — Telehealth: Payer: Self-pay | Admitting: Nurse Practitioner

## 2023-03-23 ENCOUNTER — Telehealth: Payer: Self-pay

## 2023-03-23 ENCOUNTER — Ambulatory Visit: Payer: Medicare Other | Attending: Nurse Practitioner | Admitting: Nurse Practitioner

## 2023-03-23 ENCOUNTER — Other Ambulatory Visit: Payer: Self-pay

## 2023-03-23 ENCOUNTER — Encounter: Payer: Self-pay | Admitting: Nurse Practitioner

## 2023-03-23 VITALS — BP 133/74 | HR 75 | Resp 18 | Ht 63.0 in | Wt 188.2 lb

## 2023-03-23 DIAGNOSIS — Z7985 Long-term (current) use of injectable non-insulin antidiabetic drugs: Secondary | ICD-10-CM

## 2023-03-23 DIAGNOSIS — R7989 Other specified abnormal findings of blood chemistry: Secondary | ICD-10-CM | POA: Diagnosis not present

## 2023-03-23 DIAGNOSIS — E785 Hyperlipidemia, unspecified: Secondary | ICD-10-CM

## 2023-03-23 DIAGNOSIS — E782 Mixed hyperlipidemia: Secondary | ICD-10-CM | POA: Diagnosis not present

## 2023-03-23 DIAGNOSIS — E1165 Type 2 diabetes mellitus with hyperglycemia: Secondary | ICD-10-CM | POA: Diagnosis not present

## 2023-03-23 DIAGNOSIS — I10 Essential (primary) hypertension: Secondary | ICD-10-CM | POA: Diagnosis not present

## 2023-03-23 DIAGNOSIS — F5101 Primary insomnia: Secondary | ICD-10-CM

## 2023-03-23 MED ORDER — ROSUVASTATIN CALCIUM 10 MG PO TABS
10.0000 mg | ORAL_TABLET | Freq: Every day | ORAL | 1 refills | Status: DC
  Filled 2023-03-23 – 2023-04-20 (×2): qty 90, 90d supply, fill #0
  Filled 2023-07-19: qty 90, 90d supply, fill #1

## 2023-03-23 MED ORDER — AMLODIPINE BESYLATE 10 MG PO TABS
10.0000 mg | ORAL_TABLET | Freq: Every day | ORAL | 1 refills | Status: DC
  Filled 2023-03-23 – 2023-04-20 (×2): qty 90, 90d supply, fill #0
  Filled 2023-07-19: qty 90, 90d supply, fill #1

## 2023-03-23 MED ORDER — SEMAGLUTIDE (1 MG/DOSE) 4 MG/3ML ~~LOC~~ SOPN
1.0000 mg | PEN_INJECTOR | SUBCUTANEOUS | 2 refills | Status: DC
  Filled 2023-03-23 – 2023-04-18 (×2): qty 3, 28d supply, fill #0
  Filled 2023-05-16: qty 6, 56d supply, fill #1

## 2023-03-23 MED ORDER — TRAZODONE HCL 50 MG PO TABS
25.0000 mg | ORAL_TABLET | Freq: Every evening | ORAL | 1 refills | Status: AC | PRN
  Filled 2023-03-23 (×2): qty 90, 90d supply, fill #0

## 2023-03-23 MED ORDER — LISINOPRIL 20 MG PO TABS
20.0000 mg | ORAL_TABLET | Freq: Every day | ORAL | 1 refills | Status: DC
  Filled 2023-03-23 – 2023-04-20 (×2): qty 90, 90d supply, fill #0
  Filled 2023-07-19: qty 90, 90d supply, fill #1

## 2023-03-23 MED ORDER — ATENOLOL 50 MG PO TABS
50.0000 mg | ORAL_TABLET | Freq: Every day | ORAL | 1 refills | Status: DC
  Filled 2023-03-23 – 2023-04-20 (×2): qty 90, 90d supply, fill #0
  Filled 2023-07-19: qty 90, 90d supply, fill #1

## 2023-03-23 NOTE — Progress Notes (Signed)
 I have seen and examined this patient with the advanced practice provider STUDENT and agree with the above note .

## 2023-03-23 NOTE — Telephone Encounter (Signed)
 Error

## 2023-03-23 NOTE — Progress Notes (Signed)
 Assessment & Plan:  Christine Rocha was seen today for medical management of chronic issues.  Diagnoses and all orders for this visit:  Primary Hypertension Continue all antihypertensives as prescribed.  Reminded to bring in blood pressure log for follow  up appointment.  RECOMMENDATIONS: DASH/Mediterranean Diets are healthier choices for HTN.    Mixed hyperlipidemia -     Lipid Panel Continue exercise daily to raise HDL to 40  Continue taking Rosuvastatin as prescribed   Type 2 diabetes mellitus with hyperglycemia, without long-term current use of insulin (HCC) -     CMP14+EGFR -     Microalbumin/Creatinine Ratio, Urine -     Hemoglobin A1c -     Semaglutide, 1 MG/DOSE, 4 MG/3ML SOPN; Inject 1 mg as directed once a week. DASH diet or Mediterranean diet recommended to help reduce weight  Abnormal CBC -     CBC with Differential  Primary insomnia -     traZODone (DESYREL) 50 MG tablet; Take 0.5-1 tablets (25-50 mg total) by mouth at bedtime as needed for sleep. Establish a regular sleep routine going to bed at same time, sleeping 7-8 hours per night.     Patient has been counseled on age-appropriate routine health concerns for screening and prevention. These are reviewed and up-to-date. Referrals have been placed accordingly. Immunizations are up-to-date or declined.    Subjective:   Chief Complaint  Patient presents with   Medical Management of Chronic Issues    Christine Rocha 68 y.o. female presents to office today for routine follow up on HTN. Blood pressure is well controlled today. She endorses adherence taking amlodipine, tenormin, and lisinopril BP Readings from Last 3 Encounters: 03/23/23 : 133/74 10/27/22 : 125/69 09/28/22 : 120/68   [facility] diabetic [facility] in 02/2023, she will need an order from PCP/MD to be sent to center. Has follow up appt with Podiatrist in 04/2023.   States she walks her dog daily for 30-60 minutes each  day.Uses her cane for ambulation. Has a rolling walker that she uses in the house as needed. Patient careful to avoid falls and states walking with her diabetic shoes help with balance and foot discomfort.   Having difficulty falling asleep. She requested refill on trazodone prn for sleep.      Review of Systems  Constitutional:  Positive for weight loss.       Intentional weight loss  HENT: Negative.    Eyes:  Positive for blurred vision.       Wears glasses  Respiratory: Negative.    Cardiovascular: Negative.  Negative for chest pain and leg swelling.  Gastrointestinal: Negative.   Genitourinary: Negative.  Negative for dysuria.  Musculoskeletal: Negative.  Negative for falls.       Uses cane  Skin: Negative.   Neurological:  Negative for dizziness and tingling.  Endo/Heme/Allergies: Negative.   Psychiatric/Behavioral:  The patient has insomnia.     Past Medical History:  Diagnosis Date   Arthritis    Right Knee   Diabetes mellitus without complication (HCC)    Hypertension    Seizures (HCC)     Past Surgical History:  Procedure Laterality Date   COLONOSCOPY  2021   ORIF FEMUR FRACTURE Left 04/24/2017   Procedure: OPEN REDUCTION INTERNAL FIXATION (ORIF) DISTAL FEMUR FRACTURE;  Surgeon: Nadara Mustard, MD;  Location: [facility] OR;  Service: Orthopedics;  Laterality: Left;    Family History  Problem Relation Age of Onset  Hypertension Mother    Breast cancer Mother    Hypertension Sister    Diabetes Sister     Social History Reviewed with no changes to be made today.   Outpatient Medications Prior to Visit  Medication Sig Dispense Refill   Blood Glucose Monitoring Suppl (CONTOUR NEXT EZ) w/Device KIT Use to check blood sugar once daily. 1 kit 0   glucose blood (CONTOUR NEXT TEST) test strip Use as directed to check blood sugar 3 times daily. 200 strip 3   Microlet Lancets MISC Use to check blood sugar once daily. 100 each 2   Multiple Vitamins-Minerals (WOMENS 50+  MULTI VITAMIN/MIN) TABS Take 1 tablet by mouth daily.     nystatin-triamcinolone ointment (MYCOLOG) Apply 1 application. topically 2 (two) times daily. As needed under breasts and under stomach 100 g 0   amLODipine (NORVASC) 10 MG tablet Take 1 tablet (10 mg total) by mouth daily. 90 tablet 1   atenolol (TENORMIN) 50 MG tablet Take 1 tablet (50 mg total) by mouth daily. 90 tablet 1   hydrocortisone (ANUSOL-HC) 2.5 % rectal cream Place 1 application. rectally 2 (two) times daily. 60 g 1   lisinopril (ZESTRIL) 20 MG tablet Take 1 tablet (20 mg total) by mouth daily. 90 tablet 1   rosuvastatin (CRESTOR) 10 MG tablet Take 1 tablet (10 mg total) by mouth daily. To lower cholesterol 90 tablet 1   Semaglutide, 1 MG/DOSE, 4 MG/3ML SOPN Inject 1 mg as directed once a week. 3 mL 2   traZODone (DESYREL) 50 MG tablet Take 0.5-1 tablets (25-50 mg total) by mouth at bedtime as needed for sleep. 30 tablet 3   senna-docusate (SENOKOT-S) 8.6-50 MG tablet Take 1-2 tablets by mouth daily. Stool softener (Patient not taking: Reported on 03/23/2023) 180 tablet 3   No facility-administered medications prior to visit.    Allergies  Allergen Reactions   Penicillins Nausea Only       Objective:    BP 133/74 (BP Location: Left Arm, Patient Position: Sitting, Cuff Size: Normal)   Pulse 75   Resp 18   Ht 5\' 3"  (1.6 m)   Wt 188 lb 3.2 oz (85.4 kg)   SpO2 100%   BMI 33.34 kg/m  Wt Readings from Last 3 Encounters:  03/23/23 188 lb 3.2 oz (85.4 kg)  10/27/22 190 lb 3.2 oz (86.3 kg)  08/02/22 187 lb (84.8 kg)   BP Readings from Last 3 Encounters:  03/23/23 133/74  10/27/22 125/69  09/28/22 120/68     Physical Exam Vitals and nursing note reviewed.  Constitutional:      Appearance: Normal appearance.  HENT:     Head: Normocephalic.  Cardiovascular:     Rate and Rhythm: Normal rate and regular rhythm.     Heart sounds: Normal heart sounds.  Pulmonary:     Effort: Pulmonary effort is normal.      Breath sounds: Normal breath sounds.  Musculoskeletal:        General: Normal range of motion.     Cervical back: Normal range of motion.  Skin:    General: Skin is warm and dry.  Neurological:     Mental Status: She is alert and oriented to person, place, and time.  Psychiatric:        Attention and Perception: Attention normal.        Mood and Affect: Mood normal.        Speech: Speech normal.        Behavior: Behavior  normal.        Thought Content: Thought content normal.        Cognition and Memory: Cognition normal.        Judgment: Judgment normal.         Patient has been counseled extensively about nutrition and exercise as well as the importance of adherence with medications and regular follow-up. The patient was given clear instructions to go to ER or return to medical center if symptoms don't improve, worsen or new problems develop. The patient verbalized understanding.   Follow-up: Return for see me in 6 MONTHS.   Joette Catching, BSN RN student FNP  [facility] [place], [place] [phone]   03/23/2023, 6:05 PM

## 2023-03-23 NOTE — Telephone Encounter (Signed)
 A document form has been faxed:  Therapeutic Shoes Statement , to be filled out by provider. Send document back via Fax within 5-days. Document is located in providers tray at front office.           Fax number:  [phone]

## 2023-03-24 ENCOUNTER — Other Ambulatory Visit: Payer: Self-pay

## 2023-03-25 LAB — MICROALBUMIN / CREATININE URINE RATIO
Creatinine, Urine: 169.5 mg/dL
Microalb/Creat Ratio: 12 mg/g{creat} (ref 0–29)
Microalbumin, Urine: 21.1 ug/mL

## 2023-03-25 LAB — CMP14+EGFR
ALT: 19 IU/L (ref 0–32)
AST: 15 IU/L (ref 0–40)
Albumin: 4.8 g/dL (ref 3.9–4.9)
Alkaline Phosphatase: 79 IU/L (ref 44–121)
BUN/Creatinine Ratio: 11 — ABNORMAL LOW (ref 12–28)
BUN: 8 mg/dL (ref 8–27)
Bilirubin Total: <0.2 mg/dL (ref 0.0–1.2)
CO2: 24 mmol/L (ref 20–29)
Calcium: 9.9 mg/dL (ref 8.7–10.3)
Chloride: 103 mmol/L (ref 96–106)
Creatinine, Ser: 0.75 mg/dL (ref 0.57–1.00)
Globulin, Total: 2.3 g/dL (ref 1.5–4.5)
Glucose: 81 mg/dL (ref 70–99)
Potassium: 4 mmol/L (ref 3.5–5.2)
Sodium: 142 mmol/L (ref 134–144)
Total Protein: 7.1 g/dL (ref 6.0–8.5)
eGFR: 88 mL/min/1.73

## 2023-03-25 LAB — CBC WITH DIFFERENTIAL/PLATELET
Basophils Absolute: 0.1 x10E3/uL (ref 0.0–0.2)
Basos: 1 %
EOS (ABSOLUTE): 0.1 x10E3/uL (ref 0.0–0.4)
Eos: 2 %
Hematocrit: 41.8 % (ref 34.0–46.6)
Hemoglobin: 13.6 g/dL (ref 11.1–15.9)
Immature Grans (Abs): 0 x10E3/uL (ref 0.0–0.1)
Immature Granulocytes: 0 %
Lymphocytes Absolute: 3 x10E3/uL (ref 0.7–3.1)
Lymphs: 46 %
MCH: 29.9 pg (ref 26.6–33.0)
MCHC: 32.5 g/dL (ref 31.5–35.7)
MCV: 92 fL (ref 79–97)
Monocytes Absolute: 0.5 x10E3/uL (ref 0.1–0.9)
Monocytes: 7 %
Neutrophils Absolute: 2.9 x10E3/uL (ref 1.4–7.0)
Neutrophils: 44 %
Platelets: 274 x10E3/uL (ref 150–450)
RBC: 4.55 x10E6/uL (ref 3.77–5.28)
RDW: 12.4 % (ref 11.7–15.4)
WBC: 6.6 x10E3/uL (ref 3.4–10.8)

## 2023-03-25 LAB — HEMOGLOBIN A1C
Est. average glucose Bld gHb Est-mCnc: 143 mg/dL
Hgb A1c MFr Bld: 6.6 % — ABNORMAL HIGH (ref 4.8–5.6)

## 2023-03-25 LAB — LIPID PANEL
Chol/HDL Ratio: 2.7 ratio (ref 0.0–4.4)
Cholesterol, Total: 106 mg/dL (ref 100–199)
HDL: 39 mg/dL — ABNORMAL LOW (ref >39)
LDL Chol Calc (NIH): 49 mg/dL (ref 0–99)
Triglycerides: 90 mg/dL (ref 0–149)
VLDL Cholesterol Cal: 18 mg/dL (ref 5–40)

## 2023-03-25 NOTE — Telephone Encounter (Signed)
 Forms will be signed and faxed once patient has appointment with Dr. Alvis Lemmings.

## 2023-03-27 ENCOUNTER — Encounter: Payer: Self-pay | Admitting: Nurse Practitioner

## 2023-04-01 ENCOUNTER — Other Ambulatory Visit: Payer: Self-pay

## 2023-04-07 ENCOUNTER — Other Ambulatory Visit: Payer: Self-pay

## 2023-04-12 ENCOUNTER — Ambulatory Visit: Payer: Medicare Other | Attending: Nurse Practitioner

## 2023-04-12 VITALS — Ht 61.0 in | Wt 183.0 lb

## 2023-04-12 DIAGNOSIS — Z0000 Encounter for general adult medical examination without abnormal findings: Secondary | ICD-10-CM | POA: Diagnosis not present

## 2023-04-12 DIAGNOSIS — E119 Type 2 diabetes mellitus without complications: Secondary | ICD-10-CM | POA: Diagnosis not present

## 2023-04-12 DIAGNOSIS — Z599 Problem related to housing and economic circumstances, unspecified: Secondary | ICD-10-CM

## 2023-04-12 DIAGNOSIS — Z5941 Food insecurity: Secondary | ICD-10-CM

## 2023-04-12 DIAGNOSIS — Z13820 Encounter for screening for osteoporosis: Secondary | ICD-10-CM

## 2023-04-12 NOTE — Patient Instructions (Addendum)
 Christine Rocha , Thank you for taking time to come for your Medicare Wellness Visit. I appreciate your ongoing commitment to your health goals. Please review the following plan we discussed and let me know if I can assist you in the future.   Referrals/Orders/Follow-Ups/Clinician Recommendations: Yes; A referral was sent to [facility] to do a Bone Density Scan to rule out Osteoporosis in your bones. That office will call you to schedule. Call us if you need anything.  Have a great year!!!!  This is a list of the screening recommended for you and due dates:  Health Maintenance  Topic Date Due   DEXA scan (bone density measurement)  Never done   COVID-19 Vaccine (4 - 2024-25 season) 12/07/2022   Complete foot exam   03/15/2023   Eye exam for diabetics  04/15/2023   Flu Shot  08/12/2023   Hemoglobin A1C  09/23/2023   Mammogram  02/15/2024   Yearly kidney function blood test for diabetes  03/22/2024   Yearly kidney health urinalysis for diabetes  03/22/2024   Medicare Annual Wellness Visit  04/11/2024   Pap with HPV screening  09/10/2024   Colon Cancer Screening  04/20/2027   DTaP/Tdap/Td vaccine (2 - Td or Tdap) 03/11/2032   Pneumonia Vaccine  Completed   Hepatitis C Screening  Completed   HIV Screening  Completed   Zoster (Shingles) Vaccine  Completed   HPV Vaccine  Aged Out    Advanced directives: (Declined) Advance directive discussed with you today. Even though you declined this today, please call our office should you change your mind, and we can give you the proper paperwork for you to fill out.  Next Medicare Annual Wellness Visit scheduled for next year: Yes

## 2023-04-12 NOTE — Progress Notes (Signed)
 I have reviewed and agree with the AWV documentation Attestation signed by Bertram Denver FNP-BC on  04-12-2023

## 2023-04-12 NOTE — Progress Notes (Signed)
 Because this visit was a virtual/telehealth visit,  certain criteria was not obtained, such a blood pressure, CBG if applicable, and timed get up and go. Any medications not marked as "taking" were not mentioned during the medication reconciliation part of the visit. Any vitals not documented were not able to be obtained due to this being a telehealth visit or patient was unable to self-report a recent blood pressure reading due to a lack of equipment at home via telehealth. Vitals that have been documented are verbally provided by the patient.   Subjective:   Christine Rocha is a 68 y.o. who presents for a Medicare Wellness preventive visit.  Visit Complete: Virtual I connected with  [name] on 04/12/23 by a audio enabled telemedicine application and verified that I am speaking with the correct person using two identifiers.  Patient Location: Home  Provider Location: Office/Clinic  I discussed the limitations of evaluation and management by telemedicine. The patient expressed understanding and agreed to proceed.  Vital Signs: Because this visit was a virtual/telehealth visit, some criteria may be missing or patient reported. Any vitals not documented were not able to be obtained and vitals that have been documented are patient reported.  VideoDeclined- This patient declined Librarian, academic. Therefore the visit was completed with audio only.  Persons Participating in Visit: Patient.  AWV Questionnaire: No: Patient Medicare AWV questionnaire was not completed prior to this visit.  Cardiac Risk Factors include: advanced age (>52men, >27 women);diabetes mellitus;hypertension;obesity (BMI >30kg/m2);family history of premature cardiovascular disease;smoking/ tobacco exposure (loves to walk her dog everyday (2 times per day in the neighborhood))     Objective:    Today's Vitals   04/12/23 0837  Weight: 183 lb (83 kg)  Height: 5\' 1"  (1.549 m)  PainSc: 7    PainLoc: Leg   Body mass index is 34.58 kg/m.     04/12/2023    8:39 AM 04/07/2022    8:17 AM 08/25/2021    9:08 AM 04/13/2021   10:45 AM 10/14/2020    8:14 AM 05/06/2020    8:25 AM 04/23/2017   11:38 AM  Advanced Directives  Does Patient Have a Medical Advance Directive? No No No No No No No  Would patient like information on creating a medical advance directive? No - Patient declined  Yes (MAU/Ambulatory/Procedural Areas - Information given)  Yes (MAU/Ambulatory/Procedural Areas - Information given) Yes (MAU/Ambulatory/Procedural Areas - Information given) No - Patient declined    Current Medications (verified) Outpatient Encounter Medications as of 04/12/2023  Medication Sig   amLODipine (NORVASC) 10 MG tablet Take 1 tablet (10 mg total) by mouth daily.   atenolol (TENORMIN) 50 MG tablet Take 1 tablet (50 mg total) by mouth daily.   Blood Glucose Monitoring Suppl (CONTOUR NEXT EZ) w/Device KIT Use to check blood sugar once daily.   glucose blood (CONTOUR NEXT TEST) test strip Use as directed to check blood sugar 3 times daily.   lisinopril (ZESTRIL) 20 MG tablet Take 1 tablet (20 mg total) by mouth daily.   Microlet Lancets MISC Use to check blood sugar once daily.   Multiple Vitamins-Minerals (WOMENS 50+ MULTI VITAMIN/MIN) TABS Take 1 tablet by mouth daily.   nystatin-triamcinolone ointment (MYCOLOG) Apply 1 application. topically 2 (two) times daily. As needed under breasts and under stomach   rosuvastatin (CRESTOR) 10 MG tablet Take 1 tablet (10 mg total) by mouth daily. To lower cholesterol   Semaglutide, 1 MG/DOSE, 4 MG/3ML SOPN Inject 1  mg as directed once a week.   senna-docusate (SENOKOT-S) 8.6-50 MG tablet Take 1-2 tablets by mouth daily. Stool softener (Patient not taking: Reported on 03/23/2023)   traZODone (DESYREL) 50 MG tablet Take 0.5-1 tablets (25-50 mg total) by mouth at bedtime as needed for sleep.   No facility-administered encounter medications on file as of 04/12/2023.     Allergies (verified) Penicillins   History: Past Medical History:  Diagnosis Date   Arthritis    Right Knee   Diabetes mellitus without complication (HCC)    Hypertension    Seizures (HCC)    Past Surgical History:  Procedure Laterality Date   COLONOSCOPY  2021   ORIF FEMUR FRACTURE Left 04/24/2017   Procedure: OPEN REDUCTION INTERNAL FIXATION (ORIF) DISTAL FEMUR FRACTURE;  Surgeon: Nadara Mustard, MD;  Location: [facility] OR;  Service: Orthopedics;  Laterality: Left;   Family History  Problem Relation Age of Onset   Hypertension Mother    Breast cancer Mother    Hypertension Sister    Diabetes Sister    Social History   Socioeconomic History   Marital status: Married    Spouse name: Not on file   Number of children: Not on file   Years of education: Not on file   Highest education level: Not on file  Occupational History   Not on file  Tobacco Use   Smoking status: Some Days    Current packs/day: 0.25    Average packs/day: 0.3 packs/day for 49.0 years (12.3 ttl pk-yrs)    Types: Cigarettes   Smokeless tobacco: Never  Vaping Use   Vaping status: Never Used  Substance and Sexual Activity   Alcohol use: No   Drug use: No   Sexual activity: Yes    Birth control/protection: None  Other Topics Concern   Not on file  Social History Narrative   Not on file   Social Drivers of Health   Financial Resource Strain: High Risk (04/12/2023)   Overall Financial Resource Strain (CARDIA)    Difficulty of Paying Living Expenses: Very hard  Food Insecurity: Food Insecurity Present (04/12/2023)   Hunger Vital Sign    Worried About Running Out of Food in the Last Year: Often true    Ran Out of Food in the Last Year: Often true  Transportation Needs: No Transportation Needs (04/12/2023)   PRAPARE - Administrator, Civil Service (Medical): No    Lack of Transportation (Non-Medical): No  Physical Activity: Sufficiently Active (04/12/2023)   Exercise Vital Sign    Days of  Exercise per Week: 7 days    Minutes of Exercise per Session: 60 min  Stress: Stress Concern Present (04/12/2023)   [employer] of Occupational Health - Occupational Stress Questionnaire    Feeling of Stress : To some extent  Social Connections: Moderately Isolated (04/12/2023)   Social Connection and Isolation Panel [NHANES]    Frequency of Communication with Friends and Family: Once a week    Frequency of Social Gatherings with Friends and Family: Once a week    Attends Religious Services: 1 to 4 times per year    Active Member of [employer] or Organizations: No    Attends Engineer, structural: Never    Marital Status: Married    Tobacco Counseling Ready to quit: Not Answered Counseling given: Not Answered    Clinical Intake:  Pre-visit preparation completed: Yes  Pain : 0-10 Pain Score: 7  Pain Type: Chronic pain Pain Location: Leg  BMI - recorded: 34.58 Nutritional Status: BMI > 30  Obese Nutritional Risks: None Diabetes: Yes CBG done?: No Did pt. bring in CBG monitor from home?: No  Lab Results  Component Value Date   HGBA1C 6.6 (H) 03/23/2023   HGBA1C 6.8 09/27/2022   HGBA1C 9.2 (A) 06/14/2022     How often do you need to have someone help you when you read instructions, pamphlets, or other written materials from your doctor or pharmacy?: 1 - Never What is the last grade level you completed in school?: HSG  Interpreter Needed?: No  Information entered by :: Susie Cassette, LPN.   Activities of Daily Living     04/12/2023    8:45 AM  In your present state of health, do you have any difficulty performing the following activities:  Hearing? 0  Vision? 0  Difficulty concentrating or making decisions? 1  Walking or climbing stairs? 0  Dressing or bathing? 0  Doing errands, shopping? 0  Preparing Food and eating ? N  Using the Toilet? N  In the past six months, have you accidently leaked urine? N  Do you have problems with loss of bowel  control? N  Managing your Medications? N  Managing your Finances? N  Housekeeping or managing your Housekeeping? N    Patient Care Team: Claiborne Rigg, NP as PCP - General (Nurse Practitioner) Bevelyn Ngo as [facility] Care Management [place], Karma Lew, RN as [facility] Care Management [place], Lise Auer, DPM as Consulting Physician (Podiatry) Norva Pavlov, OD as Consulting Physician (Optometry)  Indicate any recent Medical Services you may have received from other than [facility] providers in the past year (date may be approximate).     Assessment:   This is a routine wellness examination for [facility].  Hearing/Vision screen Hearing Screening - Comments:: Denies hearing difficulties.   Vision Screening - Comments:: Wears rx glasses - up to date with routine eye exams with Dr. Norva Pavlov    Goals Addressed   None    Depression Screen     04/12/2023    8:43 AM 03/23/2023    2:12 PM 10/27/2022   11:25 AM 06/14/2022    8:51 AM 05/12/2022   10:36 AM 04/07/2022    8:19 AM 03/12/2022    8:54 AM  PHQ 2/9 Scores  PHQ - 2 Score 2 4 4 6 5 2 6   PHQ- 9 Score 5 16 20 21 22 4 22     Fall Risk     04/12/2023    8:40 AM 03/23/2023    2:12 PM 10/27/2022   11:25 AM 06/14/2022    8:51 AM 05/12/2022   10:35 AM  Fall Risk   Falls in the past year? 0 0 0 0 0  Number falls in past yr: 0 0 0 0 0  Injury with Fall? 0 0 0 0 0  Risk for fall due to : No Fall Risks No Fall Risks No Fall Risks No Fall Risks No Fall Risks  Follow up Falls prevention discussed;Falls evaluation completed Falls evaluation completed Falls evaluation completed      MEDICARE RISK AT HOME:  Medicare Risk at Home Any stairs in or around the home?: No If so, are there any without handrails?: No Home free of loose throw rugs in walkways, pet beds, electrical cords, etc?: Yes Adequate lighting in your home to reduce risk of falls?: Yes Life alert?: No Use of a cane, walker or w/c?: Yes Grab bars  in  the bathroom?: No (Depends on husband for assistance) Shower chair or bench in shower?: No Elevated toilet seat or a handicapped toilet?: Yes  TIMED UP AND GO:  Was the test performed?  No  Cognitive Function: 6CIT completed    04/12/2023    8:47 AM  MMSE - Mini Mental State Exam  Not completed: Unable to complete        04/12/2023    8:42 AM 04/07/2022    8:21 AM  6CIT Screen  What Year? 0 points 0 points  What month? 0 points 0 points  What time? 0 points 0 points  Count back from 20 0 points 0 points  Months in reverse 0 points 0 points  Repeat phrase 0 points 0 points  Total Score 0 points 0 points    Immunizations Immunization History  Administered Date(s) Administered   Influenza Inj Mdck Quad Pf 12/15/2017, 12/09/2021   Influenza Split 09/13/2012   Influenza,inj,Quad PF,6+ Mos 11/15/2013, 10/28/2014, 10/11/2016, 09/28/2018, 09/29/2020   Influenza,trivalent, recombinat, inj, PF 09/13/2022, 10/01/2022   Influenza-Unspecified 10/11/2016   PFIZER(Purple Top)SARS-COV-2 Vaccination 06/01/2019, 06/22/2019   PNEUMOCOCCAL CONJUGATE-20 07/12/2022   Pfizer Covid-19 Vaccine Bivalent Booster 80yrs & up 10/12/2022   Tdap 03/12/2022   Zoster Recombinant(Shingrix) 02/16/2020, 04/19/2020    Screening Tests Health Maintenance  Topic Date Due   DEXA SCAN  Never done   COVID-19 Vaccine (4 - 2024-25 season) 12/07/2022   FOOT EXAM  03/15/2023   OPHTHALMOLOGY EXAM  04/15/2023   INFLUENZA VACCINE  08/12/2023   HEMOGLOBIN A1C  09/23/2023   MAMMOGRAM  02/15/2024   Diabetic kidney evaluation - eGFR measurement  03/22/2024   Diabetic kidney evaluation - Urine ACR  03/22/2024   Medicare Annual Wellness (AWV)  04/11/2024   Cervical Cancer Screening (HPV/Pap Cotest)  09/10/2024   Colonoscopy  04/20/2027   DTaP/Tdap/Td (2 - Td or Tdap) 03/11/2032   Pneumonia Vaccine 22+ Years old  Completed   Hepatitis C Screening  Completed   HIV Screening  Completed   Zoster Vaccines- Shingrix   Completed   HPV VACCINES  Aged Out    Health Maintenance  Health Maintenance Due  Topic Date Due   DEXA SCAN  Never done   COVID-19 Vaccine (4 - 2024-25 season) 12/07/2022   FOOT EXAM  03/15/2023   Health Maintenance Items Addressed: DEXA ordered, See Nurse Notes  Additional Screening:  Vision Screening: Recommended annual ophthalmology exams for early detection of glaucoma and other disorders of the eye.  Dental Screening: Recommended annual dental exams for proper oral hygiene  Community Resource Referral / Chronic Care Management: CRR required this visit?  No   CCM required this visit?  No     Plan:     I have personally reviewed and noted the following in the patient's chart:   Medical and social history Use of alcohol, tobacco or illicit drugs  Current medications and supplements including opioid prescriptions. Patient is not currently taking opioid prescriptions. Functional ability and status Nutritional status Physical activity Advanced directives List of other physicians Hospitalizations, surgeries, and ER visits in previous 12 months Vitals Screenings to include cognitive, depression, and falls Referrals and appointments  In addition, I have reviewed and discussed with patient certain preventive protocols, quality metrics, and best practice recommendations. A written personalized care plan for preventive services as well as general preventive health recommendations were provided to patient.     Mickeal Needy, LPN   02/19/5282   After Visit Summary: (MyChart) Due to this  being a telephonic visit, the after visit summary with patients personalized plan was offered to patient via MyChart   Notes: Please refer to Routing Comments.

## 2023-04-14 DIAGNOSIS — H524 Presbyopia: Secondary | ICD-10-CM | POA: Diagnosis not present

## 2023-04-18 ENCOUNTER — Telehealth: Payer: Self-pay | Admitting: *Deleted

## 2023-04-18 ENCOUNTER — Other Ambulatory Visit: Payer: Self-pay

## 2023-04-18 NOTE — Progress Notes (Unsigned)
 Complex Care Management Note Care Guide Note  04/18/2023 Name: Christine Rocha MRN: [id number] DOB: August 23, 1955   Complex Care Management Outreach Attempts: An unsuccessful telephone outreach was attempted today to offer the patient information about available complex care management services.  Follow Up Plan:  Additional outreach attempts will be made to offer the patient complex care management information and services.   Encounter Outcome:  No Answer  Gwenevere Ghazi  [facility]  [facility], [facility] Guide  Direct Dial: [phone]  Fax [phone]

## 2023-04-19 ENCOUNTER — Other Ambulatory Visit: Payer: Self-pay

## 2023-04-20 ENCOUNTER — Other Ambulatory Visit: Payer: Self-pay

## 2023-04-20 NOTE — Progress Notes (Signed)
 Complex Care Management Note  Care Guide Note 04/20/2023 Name: Christine Rocha MRN: [id number] DOB: 1955-11-30  Christine Rocha is a 68 y.o. year old female who sees Claiborne Rigg, NP for primary care. I reached out to Christine Rocha by phone today to offer complex care management services.  Christine Rocha was given information about Complex Care Management services today including:   The Complex Care Management services include support from the care team which includes your Nurse Care Manager, Clinical Social Worker, or Pharmacist.  The Complex Care Management team is here to help remove barriers to the health concerns and goals most important to you. Complex Care Management services are voluntary, and the patient may decline or stop services at any time by request to their care team member.   Complex Care Management Consent Status: Patient agreed to services and verbal consent obtained.   Follow up plan:  Telephone appointment with complex care management team member scheduled for:  5/20  routing referral to [employer] for food resources.   Encounter Outcome:  Patient Scheduled  Gwenevere Ghazi  [facility]  [facility] Guide  Direct Dial: [phone]  Fax [phone]

## 2023-04-21 ENCOUNTER — Telehealth: Payer: Self-pay | Admitting: *Deleted

## 2023-04-21 NOTE — Progress Notes (Signed)
 Complex Care Management Note Care Guide Note  04/21/2023 Name: Christine Rocha MRN: [id number] DOB: 1955-08-17   Complex Care Management Outreach Attempts: An unsuccessful telephone outreach was attempted today to offer the patient information about available complex care management services.  Follow Up Plan:  Additional outreach attempts will be made to offer the patient complex care management information and services.   Encounter Outcome:  Left Message   Dione Booze  [facility] HealthPopulation Health Care Guide  Direct [phone] Fax:[phone] Website: [email or web address]

## 2023-04-22 ENCOUNTER — Telehealth: Payer: Self-pay | Admitting: *Deleted

## 2023-04-22 NOTE — Progress Notes (Signed)
 Complex Care Management Note Care Guide Note  04/22/2023 Name: Christine Rocha MRN: [id number] DOB: [birth year]   Complex Care Management Outreach Attempts: An unsuccessful telephone outreach was attempted today to offer the patient information about available complex care management services.  Follow Up Plan:  Additional outreach attempts will be made to offer the patient complex care management information and services.   Encounter Outcome:  No Answer  [facility] HealthPopulation Health Care Guide  Direct Dial:[phone] Fax:[phone] Website: [email or web address]

## 2023-04-25 ENCOUNTER — Telehealth: Payer: Self-pay | Admitting: *Deleted

## 2023-04-25 NOTE — Progress Notes (Signed)
 Complex Care Management Note Care Guide Note  04/25/2023 Name: Christine Rocha MRN: [id number] DOB: 03-08-1955  Christine Rocha is a 68 y.o. year old female who is a primary care patient of Collins Dean, NP . The community resource team was consulted for assistance with Food Insecurity and Financial Difficulties related to everything in general   SDOH screenings and interventions completed:  Yes  Social Drivers of Health From This Encounter   Financial Resource Strain: Medium Risk (04/25/2023)   Overall Financial Resource Strain (CARDIA)    Difficulty of Paying Living Expenses: Somewhat hard    SDOH Interventions Today    Flowsheet Row Most Recent Value  SDOH Interventions   Food Insecurity Interventions Community Resources Provided  [Will apply food stamps, Food banks mailed]  Financial Strain Interventions Community Resources Provided  [Provided information about resources]        Care guide performed the following interventions: Patient provided with information about care guide support team and interviewed to confirm resource needs.  Follow Up Plan:  No further follow up planned at this time. The patient has been provided with needed resources.  Encounter Outcome:  Patient Visit Completed  Carine Nordgren Greenauer-Moran  [facility] HealthPopulation Health Care Guide  Direct Rocha:[phone] Fax:[phone] Website: [email or web address]

## 2023-04-25 NOTE — Progress Notes (Signed)
 Complex Care Management Note Care Guide Note  04/25/2023 Name: Christine Rocha MRN: [id number] DOB: 09/10/1955   Complex Care Management Outreach Attempts: A third unsuccessful outreach was attempted today to offer the patient with information about available complex care management services.  Follow Up Plan:  No further outreach attempts will be made at this time. We have been unable to contact the patient to offer or enroll patient in complex care management services.  Encounter Outcome:  No Answer [facility] HealthPopulation Health Care Guide  Direct Dial:[phone] [phone] Website: [email or web address]

## 2023-04-27 DIAGNOSIS — K080 Exfoliation of teeth due to systemic causes: Secondary | ICD-10-CM | POA: Diagnosis not present

## 2023-05-02 ENCOUNTER — Encounter: Payer: Self-pay | Admitting: Family Medicine

## 2023-05-02 ENCOUNTER — Other Ambulatory Visit: Payer: Self-pay

## 2023-05-02 ENCOUNTER — Ambulatory Visit: Attending: Family Medicine | Admitting: Family Medicine

## 2023-05-02 VITALS — BP 132/76 | HR 71 | Resp 16 | Ht 63.0 in | Wt 188.0 lb

## 2023-05-02 DIAGNOSIS — I10 Essential (primary) hypertension: Secondary | ICD-10-CM | POA: Insufficient documentation

## 2023-05-02 DIAGNOSIS — E119 Type 2 diabetes mellitus without complications: Secondary | ICD-10-CM | POA: Diagnosis not present

## 2023-05-02 DIAGNOSIS — Z716 Tobacco abuse counseling: Secondary | ICD-10-CM | POA: Insufficient documentation

## 2023-05-02 DIAGNOSIS — R002 Palpitations: Secondary | ICD-10-CM

## 2023-05-02 DIAGNOSIS — L258 Unspecified contact dermatitis due to other agents: Secondary | ICD-10-CM

## 2023-05-02 DIAGNOSIS — Z7985 Long-term (current) use of injectable non-insulin antidiabetic drugs: Secondary | ICD-10-CM | POA: Diagnosis not present

## 2023-05-02 DIAGNOSIS — L259 Unspecified contact dermatitis, unspecified cause: Secondary | ICD-10-CM | POA: Insufficient documentation

## 2023-05-02 DIAGNOSIS — E0590 Thyrotoxicosis, unspecified without thyrotoxic crisis or storm: Secondary | ICD-10-CM | POA: Diagnosis not present

## 2023-05-02 DIAGNOSIS — F17210 Nicotine dependence, cigarettes, uncomplicated: Secondary | ICD-10-CM | POA: Insufficient documentation

## 2023-05-02 DIAGNOSIS — R0602 Shortness of breath: Secondary | ICD-10-CM | POA: Insufficient documentation

## 2023-05-02 DIAGNOSIS — L243 Irritant contact dermatitis due to cosmetics: Secondary | ICD-10-CM

## 2023-05-02 DIAGNOSIS — E1165 Type 2 diabetes mellitus with hyperglycemia: Secondary | ICD-10-CM

## 2023-05-02 MED ORDER — HYDROCORTISONE 0.5 % EX CREA
1.0000 | TOPICAL_CREAM | Freq: Two times a day (BID) | CUTANEOUS | 0 refills | Status: AC
  Filled 2023-05-02: qty 28.4, 14d supply, fill #0

## 2023-05-02 NOTE — Progress Notes (Signed)
 Subjective:  Patient ID: Christine Rocha, female    DOB: [birth year]  Age: 68 y.o. MRN: [id number]  CC: Diabetes     Discussed the use of AI scribe software for clinical note transcription with the patient, who gave verbal consent to proceed.  History of Present Illness The 68 year old female patient of Zelda Fleming, FNP , with a history of Hypertension, Hyperlipidmia, Type 2 diabetes, history of pes planus per Podiatry presents with concerns about obtaining diabetic shoes. She reports receiving one pair last year but has not received a second pair for this year. The patient's diabetes is well-managed with an A1c of 6.6. She is scheduled to see a podiatrist later this month.  The patient also reports a rash under both arms, which she suspects may be due to a new deodorant. The rash is mildly itchy.  Additionally, the patient reports episodes of heart racing, particularly noticeable in the mornings. These episodes have been occurring for a couple of nights and are associated with mild shortness of breath but no chest pain. The patient is a current smoker, reporting a reduction to about ten cigarettes a day.    Past Medical History:  Diagnosis Date   Arthritis    Right Knee   Diabetes mellitus without complication (HCC)    Hypertension    Seizures (HCC)     Past Surgical History:  Procedure Laterality Date   COLONOSCOPY  2021   ORIF FEMUR FRACTURE Left 04/24/2017   Procedure: OPEN REDUCTION INTERNAL FIXATION (ORIF) DISTAL FEMUR FRACTURE;  Surgeon: Timothy Ford, MD;  Location: [facility] OR;  Service: Orthopedics;  Laterality: Left;    Family History  Problem Relation Age of Onset   Hypertension Mother    Breast cancer Mother    Hypertension Sister    Diabetes Sister     Social History   Socioeconomic History   Marital status: Married    Spouse name: Not on file   Number of children: Not on file   Years of education: Not on file   Highest education level: Not on file   Occupational History   Not on file  Tobacco Use   Smoking status: Some Days    Current packs/day: 0.25    Average packs/day: 0.3 packs/day for 49.0 years (12.3 ttl pk-yrs)    Types: Cigarettes   Smokeless tobacco: Never  Vaping Use   Vaping status: Never Used  Substance and Sexual Activity   Alcohol use: No   Drug use: No   Sexual activity: Yes    Birth control/protection: None  Other Topics Concern   Not on file  Social History Narrative   Not on file   Social Drivers of Health   Financial Resource Strain: Medium Risk (04/25/2023)   Overall Financial Resource Strain (CARDIA)    Difficulty of Paying Living Expenses: Somewhat hard  Food Insecurity: Food Insecurity Present (04/12/2023)   Hunger Vital Sign    Worried About Running Out of Food in the Last Year: Often true    Ran Out of Food in the Last Year: Often true  Transportation Needs: No Transportation Needs (04/12/2023)   PRAPARE - Administrator, Civil Service (Medical): No    Lack of Transportation (Non-Medical): No  Physical Activity: Sufficiently Active (04/12/2023)   Exercise Vital Sign    Days of Exercise per Week: 7 days    Minutes of Exercise per Session: 60 min  Stress: Stress Concern Present (04/12/2023)   [employer] of  Occupational Health - Occupational Stress Questionnaire    Feeling of Stress : To some extent  Social Connections: Moderately Isolated (04/12/2023)   Social Connection and Isolation Panel [NHANES]    Frequency of Communication with Friends and Family: Once a week    Frequency of Social Gatherings with Friends and Family: Once a week    Attends Religious Services: 1 to 4 times per year    Active Member of [employer] or Organizations: No    Attends Banker Meetings: Never    Marital Status: Married    Allergies  Allergen Reactions   Penicillins Nausea Only    Outpatient Medications Prior to Visit  Medication Sig Dispense Refill   amLODipine  (NORVASC ) 10 MG tablet  Take 1 tablet (10 mg total) by mouth daily. 90 tablet 1   atenolol  (TENORMIN ) 50 MG tablet Take 1 tablet (50 mg total) by mouth daily. 90 tablet 1   Blood Glucose Monitoring Suppl (CONTOUR NEXT EZ) w/Device KIT Use to check blood sugar once daily. 1 kit 0   glucose blood (CONTOUR NEXT TEST) test strip Use as directed to check blood sugar 3 times daily. 200 strip 3   lisinopril  (ZESTRIL ) 20 MG tablet Take 1 tablet (20 mg total) by mouth daily. 90 tablet 1   Microlet Lancets MISC Use to check blood sugar once daily. 100 each 2   Multiple Vitamins-Minerals (WOMENS 50+ MULTI VITAMIN/MIN) TABS Take 1 tablet by mouth daily.     nystatin -triamcinolone  ointment (MYCOLOG) Apply 1 application. topically 2 (two) times daily. As needed under breasts and under stomach 100 g 0   rosuvastatin  (CRESTOR ) 10 MG tablet Take 1 tablet (10 mg total) by mouth daily. To lower cholesterol 90 tablet 1   Semaglutide , 1 MG/DOSE, 4 MG/3ML SOPN Inject 1 mg as directed once a week. 3 mL 2   senna-docusate (SENOKOT-S) 8.6-50 MG tablet Take 1-2 tablets by mouth daily. Stool softener 180 tablet 3   traZODone  (DESYREL ) 50 MG tablet Take 0.5-1 tablets (25-50 mg total) by mouth at bedtime as needed for sleep. 90 tablet 1   No facility-administered medications prior to visit.     ROS Review of Systems  Constitutional:  Negative for activity change and appetite change.  HENT:  Negative for sinus pressure and sore throat.   Respiratory:  Negative for chest tightness, shortness of breath and wheezing.   Cardiovascular:  Positive for palpitations. Negative for chest pain.  Gastrointestinal:  Negative for abdominal distention, abdominal pain and constipation.  Genitourinary: Negative.   Musculoskeletal: Negative.   Skin:  Positive for rash.  Psychiatric/Behavioral:  Negative for behavioral problems and dysphoric mood.     Objective:  BP 132/76 (BP Location: Left Arm, Patient Position: Sitting, Cuff Size: Normal)   Pulse 71    Resp 16   Ht 5\' 3"  (1.6 m)   Wt 188 lb (85.3 kg)   SpO2 98%   BMI 33.30 kg/m      05/02/2023   10:05 AM 04/12/2023    8:37 AM 03/23/2023    1:57 PM  BP/Weight  Systolic BP 132  829  Diastolic BP 76  74  Wt. (Lbs) 188 183 188.2  BMI 33.3 kg/m2 34.58 kg/m2 33.34 kg/m2      Physical Exam Constitutional:      Appearance: She is well-developed.  Cardiovascular:     Rate and Rhythm: Normal rate.     Heart sounds: Normal heart sounds. No murmur heard. Pulmonary:     Effort: Pulmonary  effort is normal.     Breath sounds: Normal breath sounds. No wheezing or rales.  Chest:     Chest wall: No tenderness.  Abdominal:     General: Bowel sounds are normal. There is no distension.     Palpations: Abdomen is soft. There is no mass.     Tenderness: There is no abdominal tenderness.  Musculoskeletal:        General: Normal range of motion.     Right lower leg: No edema.     Left lower leg: No edema.  Skin:    Comments: Right axilla with hyperpigmented patch  Neurological:     Mental Status: She is alert and oriented to person, place, and time.  Psychiatric:        Mood and Affect: Mood normal.        Latest Ref Rng & Units 03/23/2023    2:56 PM 06/14/2022   10:47 AM 03/12/2022    9:17 AM  CMP  Glucose 70 - 99 mg/dL 81  657  846   BUN 8 - 27 mg/dL 8  7  10    Creatinine 0.57 - 1.00 mg/dL 9.62  9.52  8.41   Sodium 134 - 144 mmol/L 142  141  145   Potassium 3.5 - 5.2 mmol/L 4.0  4.3  3.8   Chloride 96 - 106 mmol/L 103  103  107   CO2 20 - 29 mmol/L 24  23  22    Calcium  8.7 - 10.3 mg/dL 9.9  9.9  9.3   Total Protein 6.0 - 8.5 g/dL 7.1  7.0  6.8   Total Bilirubin 0.0 - 1.2 mg/dL <3.2  <4.4  0.2   Alkaline Phos 44 - 121 IU/L 79  102  78   AST 0 - 40 IU/L 15  15  15    ALT 0 - 32 IU/L 19  21  14      Lipid Panel     Component Value Date/Time   CHOL 106 03/23/2023 1456   TRIG 90 03/23/2023 1456   HDL 39 (L) 03/23/2023 1456   CHOLHDL 2.7 03/23/2023 1456   CHOLHDL 3.8 02/25/2016  1737   VLDL 33 (H) 02/25/2016 1737   LDLCALC 49 03/23/2023 1456    CBC    Component Value Date/Time   WBC 6.6 03/23/2023 1456   WBC 5.4 04/13/2021 0944   RBC 4.55 03/23/2023 1456   RBC 4.76 04/13/2021 0944   HGB 13.6 03/23/2023 1456   HCT 41.8 03/23/2023 1456   PLT 274 03/23/2023 1456   MCV 92 03/23/2023 1456   MCH 29.9 03/23/2023 1456   MCH 29.6 04/13/2021 0944   MCHC 32.5 03/23/2023 1456   MCHC 33.9 04/13/2021 0944   RDW 12.4 03/23/2023 1456   LYMPHSABS 3.0 03/23/2023 1456   MONOABS 595 02/25/2016 1737   EOSABS 0.1 03/23/2023 1456   BASOSABS 0.1 03/23/2023 1456    Lab Results  Component Value Date   HGBA1C 6.6 (H) 03/23/2023    Lab Results  Component Value Date   TSH 1.230 09/19/2019       Assessment & Plan Palpitations Episodes of palpitations with mild chest pain and dyspnea. EKG unchanged since 2022, with non specific T wave abnormalities. Differential includes hyperthyroidism. - Order thyroid  function tests. - Consider cardiology referral if symptoms persist.  Diabetes mellitus type 2 Diabetes well-controlled with A1c of 6.6. Compliant with medications. Awaiting new diabetic shoes. - Continue current diabetes medications. - Ensure diabetic shoes are obtained as  needed.  Contact dermatitis Itchy rash under arms likely due to deodorant reaction. - Prescribe hydrocortisone  cream for topical application. - Advise to change deodorant.  General Health Maintenance Completed eye exam. Smoking reduction ongoing, currently at ten cigarettes daily; not ready to quit yet. - Schedule a physical exam as her [facility] is requesting she undergo a Preventive exam - Encourage smoking cessation and continue to reduce smoking.      Meds ordered this encounter  Medications   hydrocortisone  cream 0.5 %    Sig: Apply 1 Application topically 2 (two) times daily.    Dispense:  56 g    Refill:  0    Follow-up: Return in about 1 month (around 06/01/2023) for  CPE/ Preventive Health Exam with PCP.       Joaquin Mulberry, MD, FAAFP. [facility] [place], [place] [phone]   05/02/2023, 10:47 AM

## 2023-05-02 NOTE — Progress Notes (Signed)
Needs DM shoes

## 2023-05-02 NOTE — Patient Instructions (Signed)
 VISIT SUMMARY:  During your visit, we discussed your concerns about obtaining diabetic shoes, a rash under your arms, and episodes of heart racing. We also reviewed your diabetes management and general health maintenance.  YOUR PLAN:  -PALPITATIONS: Palpitations are feelings of having a fast-beating, fluttering, or pounding heart. We will check your thyroid  function with a blood test to rule out hyperthyroidism, which can cause these symptoms. If the symptoms continue, we may refer you to a heart specialist.  -DIABETES MELLITUS TYPE 2: Type 2 diabetes is a condition that affects the way your body processes blood sugar. Your diabetes is well-controlled with an A1c of 6.6. Please continue your current medications, and we will ensure you get the diabetic shoes you need.  -CONTACT DERMATITIS: Contact dermatitis is a skin rash caused by contact with a certain substance. Your rash under the arms is likely due to a reaction to your new deodorant. We have prescribed hydrocortisone  cream for you to apply to the affected area and recommend changing your deodorant.  -GENERAL HEALTH MAINTENANCE: Your eye exam is complete, and you are making progress in reducing smoking. We encourage you to continue reducing smoking and aim for cessation. Please schedule a physical exam soon.  INSTRUCTIONS:  Please follow up with the thyroid  function tests as ordered. If your palpitations persist, we may need to refer you to a cardiologist. Continue taking your diabetes medications as prescribed and ensure you obtain your diabetic shoes. Apply the hydrocortisone  cream to the rash under your arms and switch to a different deodorant. Schedule a physical exam and continue working on reducing smoking with the goal of quitting.

## 2023-05-03 ENCOUNTER — Encounter: Payer: Self-pay | Admitting: Family Medicine

## 2023-05-03 LAB — TSH: TSH: 0.754 u[IU]/mL (ref 0.450–4.500)

## 2023-05-03 LAB — T3: T3, Total: 167 ng/dL (ref 71–180)

## 2023-05-03 LAB — T4, FREE: Free T4: 0.94 ng/dL (ref 0.82–1.77)

## 2023-05-03 NOTE — Addendum Note (Signed)
 Addended by: Namon Villarin on: 05/03/2023 02:51 PM   Modules accepted: Orders

## 2023-05-05 ENCOUNTER — Telehealth: Payer: Self-pay | Admitting: *Deleted

## 2023-05-05 NOTE — Progress Notes (Signed)
 Complex Care Management Note Care Guide Note  05/05/2023 Name: PEYTIN DECHERT MRN: [id number] DOB: 1955/09/25  Corey Dial is a 68 y.o. year old female who is a primary care patient of Collins Dean, NP . The community resource team was consulted for assistance with Patient called to let me know she received her information I mailed   SDOH screenings and interventions completed:  No        Care guide performed the following interventions: Follow up call placed to the patient to discuss status of referral.  Follow Up Plan:  No further follow up planned at this time. The patient has been provided with needed resources.  Encounter Outcome:  Patient Visit Completed Malcolm Hetz Greenauer-Moran  [facility] HealthPopulation Health Care Guide  Direct [phone] Fax:[phone] Website: [email or web address]

## 2023-05-10 ENCOUNTER — Telehealth: Payer: Self-pay | Admitting: Family Medicine

## 2023-05-10 ENCOUNTER — Ambulatory Visit: Payer: Medicare Other | Admitting: Podiatry

## 2023-05-10 ENCOUNTER — Encounter: Payer: Self-pay | Admitting: Podiatry

## 2023-05-10 DIAGNOSIS — M2142 Flat foot [pes planus] (acquired), left foot: Secondary | ICD-10-CM | POA: Diagnosis not present

## 2023-05-10 DIAGNOSIS — M2141 Flat foot [pes planus] (acquired), right foot: Secondary | ICD-10-CM

## 2023-05-10 DIAGNOSIS — E1142 Type 2 diabetes mellitus with diabetic polyneuropathy: Secondary | ICD-10-CM

## 2023-05-10 DIAGNOSIS — E119 Type 2 diabetes mellitus without complications: Secondary | ICD-10-CM

## 2023-05-10 DIAGNOSIS — L84 Corns and callosities: Secondary | ICD-10-CM | POA: Diagnosis not present

## 2023-05-10 DIAGNOSIS — M79676 Pain in unspecified toe(s): Secondary | ICD-10-CM

## 2023-05-10 DIAGNOSIS — B351 Tinea unguium: Secondary | ICD-10-CM | POA: Diagnosis not present

## 2023-05-10 NOTE — Progress Notes (Unsigned)
 Patient stopped in to get ppw to take to Dr. To have signed for DM shoes  Christine Rocha CPed, CFo, CFm

## 2023-05-10 NOTE — Telephone Encounter (Signed)
Form has been placed on providers desk for signature.

## 2023-05-10 NOTE — Telephone Encounter (Addendum)
 FYI  The patient came in the office today on May 10, 2023 to drop off forms from [facility] for Dr. Adan Holms to sign. The forms have been placed today in Dr. Jennet Mode box for review.

## 2023-05-11 ENCOUNTER — Ambulatory Visit
Admission: RE | Admit: 2023-05-11 | Discharge: 2023-05-11 | Disposition: A | Discharge status: Home / Self Care | Source: Ambulatory Visit | Attending: Nurse Practitioner | Admitting: Nurse Practitioner

## 2023-05-11 DIAGNOSIS — Z13820 Encounter for screening for osteoporosis: Secondary | ICD-10-CM

## 2023-05-11 DIAGNOSIS — M8588 Other specified disorders of bone density and structure, other site: Secondary | ICD-10-CM | POA: Diagnosis not present

## 2023-05-11 DIAGNOSIS — N958 Other specified menopausal and perimenopausal disorders: Secondary | ICD-10-CM | POA: Diagnosis not present

## 2023-05-12 DIAGNOSIS — E119 Type 2 diabetes mellitus without complications: Secondary | ICD-10-CM | POA: Diagnosis not present

## 2023-05-12 DIAGNOSIS — K080 Exfoliation of teeth due to systemic causes: Secondary | ICD-10-CM | POA: Diagnosis not present

## 2023-05-14 ENCOUNTER — Encounter: Payer: Self-pay | Admitting: Nurse Practitioner

## 2023-05-15 NOTE — Progress Notes (Signed)
 ANNUAL DIABETIC FOOT EXAM  Subjective: Christine Rocha presents today for annual diabetic foot exam. She would like update on the status of her diabetic shoes. Chief Complaint  Patient presents with   routine foot care    Rm 15 Diabetic/NIDDm/last blood sugar 86/ A1C5/ Dr Jamal Mays 05/03/23   Patient confirms h/o diabetes.  Patient denies any h/o foot wounds.  Patient has been diagnosed with neuropathy.  Collins Dean, NP is patient's PCP.  Past Medical History:  Diagnosis Date   Arthritis    Right Knee   Diabetes mellitus without complication (HCC)    Hypertension    Seizures (HCC)    Patient Active Problem List   Diagnosis Date Noted   Diabetic peripheral neuropathy associated with type 2 diabetes mellitus (HCC) 03/12/2022   Mass of joint of left shoulder 07/16/2021   Current severe episode of major depressive disorder without psychotic features, unspecified whether recurrent (HCC) 03/04/2021   Yeast infection of the skin 11/03/2020   Femur fracture, left (HCC) 04/23/2017   Preventative health care 10/28/2014   Encounter for screening mammogram for breast cancer 10/28/2014   Primary osteoarthritis of left knee 10/28/2014   Essential hypertension 11/15/2013   Diabetes (HCC) 11/15/2013   Uncontrolled hypertension 10/11/2012   Smoking addiction 10/11/2012   Past Surgical History:  Procedure Laterality Date   COLONOSCOPY  2021   ORIF FEMUR FRACTURE Left 04/24/2017   Procedure: OPEN REDUCTION INTERNAL FIXATION (ORIF) DISTAL FEMUR FRACTURE;  Surgeon: Timothy Ford, MD;  Location: [facility] OR;  Service: Orthopedics;  Laterality: Left;   Current Outpatient Medications on File Prior to Visit  Medication Sig Dispense Refill   amLODipine  (NORVASC ) 10 MG tablet Take 1 tablet (10 mg total) by mouth daily. 90 tablet 1   atenolol  (TENORMIN ) 50 MG tablet Take 1 tablet (50 mg total) by mouth daily. 90 tablet 1   Blood Glucose Monitoring Suppl (CONTOUR NEXT EZ) w/Device KIT Use to  check blood sugar once daily. 1 kit 0   glucose blood (CONTOUR NEXT TEST) test strip Use as directed to check blood sugar 3 times daily. 200 strip 3   hydrocortisone  cream 0.5 % Apply 1 Application topically 2 (two) times daily. 56 g 0   lisinopril  (ZESTRIL ) 20 MG tablet Take 1 tablet (20 mg total) by mouth daily. 90 tablet 1   Microlet Lancets MISC Use to check blood sugar once daily. 100 each 2   Multiple Vitamins-Minerals (WOMENS 50+ MULTI VITAMIN/MIN) TABS Take 1 tablet by mouth daily.     nystatin -triamcinolone  ointment (MYCOLOG) Apply 1 application. topically 2 (two) times daily. As needed under breasts and under stomach 100 g 0   rosuvastatin  (CRESTOR ) 10 MG tablet Take 1 tablet (10 mg total) by mouth daily. To lower cholesterol 90 tablet 1   Semaglutide , 1 MG/DOSE, 4 MG/3ML SOPN Inject 1 mg as directed once a week. 3 mL 2   senna-docusate (SENOKOT-S) 8.6-50 MG tablet Take 1-2 tablets by mouth daily. Stool softener 180 tablet 3   traZODone  (DESYREL ) 50 MG tablet Take 0.5-1 tablets (25-50 mg total) by mouth at bedtime as needed for sleep. 90 tablet 1   No current facility-administered medications on file prior to visit.    Allergies  Allergen Reactions   Penicillins Nausea Only   Social History   Occupational History   Not on file  Tobacco Use   Smoking status: Some Days    Current packs/day: 0.25    Average packs/day: 0.3 packs/day for 49.0  years (12.3 ttl pk-yrs)    Types: Cigarettes   Smokeless tobacco: Never  Vaping Use   Vaping status: Never Used  Substance and Sexual Activity   Alcohol use: No   Drug use: No   Sexual activity: Yes    Birth control/protection: None   Family History  Problem Relation Age of Onset   Hypertension Mother    Breast cancer Mother    Hypertension Sister    Diabetes Sister    Immunization History  Administered Date(s) Administered   Influenza Inj Mdck Quad Pf 12/15/2017, 12/09/2021   Influenza Split 09/13/2012   Influenza,inj,Quad  PF,6+ Mos 11/15/2013, 10/28/2014, 10/11/2016, 09/28/2018, 09/29/2020   Influenza,trivalent, recombinat, inj, PF 09/13/2022, 10/01/2022   Influenza-Unspecified 10/11/2016   PFIZER(Purple Top)SARS-COV-2 Vaccination 06/01/2019, 06/22/2019   PNEUMOCOCCAL CONJUGATE-20 07/12/2022   Pfizer Covid-19 Vaccine Bivalent Booster 68yrs & up 10/12/2022   Tdap 03/12/2022   Zoster Recombinant(Shingrix ) 02/16/2020, 04/19/2020     Review of Systems: Negative except as noted in the HPI.   Objective: There were no vitals filed for this visit.  Christine Rocha is a pleasant 68 y.o. female in NAD. AAO X 3.  Diabetic foot exam was performed with the following findings:   Vascular Examination: Capillary refill time immediate b/l. Vascular status intact b/l with palpable pedal pulses. Pedal hair present b/l. No pain with calf compression b/l. Skin temperature gradient WNL b/l. No cyanosis or clubbing b/l. No ischemia or gangrene noted b/l. No edema noted b/l LE.  Neurological Examination: Pt has subjective symptoms of neuropathy. Protective sensation decreased with 10 gram monofilament b/l.  Dermatological Examination: Pedal skin with normal turgor, texture and tone b/l.  No open wounds. No interdigital macerations.   Toenails 1-5 b/l thick, discolored, elongated with subungual debris and pain on dorsal palpation.   Hyperkeratotic lesion(s) bilateral great toes.  No erythema, no edema, no drainage, no fluctuance.  Musculoskeletal Examination: Muscle strength 5/5 to all lower extremity muscle groups bilaterally. Pes planus deformity noted bilateral LE. Utilizes cane for ambulation assistance.  Radiographs: None     Lab Results  Component Value Date   HGBA1C 6.6 (H) 03/23/2023   ADA Risk Categorization: High Risk  Patient has one or more of the following: Loss of protective sensation Absent pedal pulses Severe Foot deformity History of foot ulcer  Assessment: 1. Pain due to onychomycosis of  toenail   2. Callus   3. Pes planus of both feet   4. Diabetic peripheral neuropathy associated with type 2 diabetes mellitus (HCC)   5. Encounter for diabetic foot exam [facility])     Plan: -Patient was evaluated today. All questions/concerns addressed on today's visit. -Diabetic foot examination performed today. -Patient to be updated on diabetic shoes by Pedorthist; she was given contact information. -Continue diabetic foot care principles: inspect feet daily, monitor glucose as recommended by PCP and/or Endocrinologist, and follow prescribed diet per PCP, Endocrinologist and/or dietician. -Patient to continue soft, supportive shoe gear daily. -Mycotic toenails 1-5 bilaterally were debrided in length and girth with sterile nail nippers and dremel without incident. -Callus(es) bilateral great toes pared utilizing sterile scalpel blade without complication or incident. Total number debrided =2. -Patient/POA to call should there be question/concern in the interim. Return in about 3 months (around 08/09/2023).  Luella Sager, DPM      [place] LOCATION: [address]  [place], [place] [address]                   Office [phone]   [facility] LOCATION: [address] [place], [place] [address] Office [phone]

## 2023-05-16 ENCOUNTER — Other Ambulatory Visit: Payer: Self-pay

## 2023-05-25 DIAGNOSIS — F3342 Major depressive disorder, recurrent, in full remission: Secondary | ICD-10-CM | POA: Diagnosis not present

## 2023-05-27 ENCOUNTER — Telehealth: Payer: Self-pay

## 2023-05-27 NOTE — Telephone Encounter (Signed)
 Patient diabetic shoes are here/ paperwork expires 11/01/23

## 2023-05-31 ENCOUNTER — Other Ambulatory Visit: Payer: Self-pay

## 2023-05-31 NOTE — Patient Outreach (Signed)
 Patient returned CMRN call. Initial visit rescheduled for 06/10/23 at 9 AM.

## 2023-06-01 ENCOUNTER — Encounter: Payer: Self-pay | Admitting: Nurse Practitioner

## 2023-06-01 ENCOUNTER — Other Ambulatory Visit: Payer: Self-pay

## 2023-06-01 ENCOUNTER — Ambulatory Visit: Admitting: Nurse Practitioner

## 2023-06-01 ENCOUNTER — Ambulatory Visit: Attending: Nurse Practitioner | Admitting: Nurse Practitioner

## 2023-06-01 VITALS — BP 124/73 | HR 73 | Resp 119 | Ht 63.0 in | Wt 185.4 lb

## 2023-06-01 DIAGNOSIS — I10 Essential (primary) hypertension: Secondary | ICD-10-CM

## 2023-06-01 DIAGNOSIS — E1165 Type 2 diabetes mellitus with hyperglycemia: Secondary | ICD-10-CM | POA: Diagnosis not present

## 2023-06-01 DIAGNOSIS — Z7985 Long-term (current) use of injectable non-insulin antidiabetic drugs: Secondary | ICD-10-CM | POA: Diagnosis not present

## 2023-06-01 MED ORDER — SEMAGLUTIDE (2 MG/DOSE) 8 MG/3ML ~~LOC~~ SOPN
2.0000 mg | PEN_INJECTOR | SUBCUTANEOUS | 2 refills | Status: DC
Start: 2023-06-01 — End: 2023-09-23
  Filled 2023-06-01: qty 3, 28d supply, fill #0
  Filled 2023-08-04 (×2): qty 3, 28d supply, fill #1
  Filled 2023-08-29: qty 3, 28d supply, fill #2

## 2023-06-01 NOTE — Progress Notes (Signed)
 Assessment & Plan:   [name] was seen today for hypertension.  Diagnoses and all orders for this visit:  Primary hypertension Continue all antihypertensives as prescribed.  Reminded to bring in blood pressure log for follow  up appointment.  RECOMMENDATIONS: DASH/Mediterranean Diets are healthier choices for HTN.     Type 2 diabetes mellitus with hyperglycemia, without long-term current use of insulin   Increase dose from 1 mg to 2 mg. -     Semaglutide , 2 MG/DOSE, 8 MG/3ML SOPN; Inject 2 mg as directed once a week.    Patient has been counseled on age-appropriate routine health concerns for screening and prevention. These are reviewed and up-to-date. Referrals have been placed accordingly. Immunizations are up-to-date or declined.    Subjective:   Chief Complaint  Patient presents with   Hypertension    Christine Rocha 68 y.o. female presents to office today for follow up to HTN  Patient has been counseled on age-appropriate routine health concerns for screening and prevention. These are reviewed and up-to-date. Referrals have been placed accordingly. Immunizations are up-to-date or declined.     MAMMOGRAM: Up-to-date Colon cancer screening: Up today Ophthalmology exam: Overdue.  She has appointment scheduled for tomorrow Pap smear: N/A    Blood pressure is well controlled.  Currently taking as prescribed atenolol , amlodipine , lisinopril  BP Readings from Last 3 Encounters:  06/01/23 124/73  05/02/23 132/76  03/23/23 133/74     We went over all of her lab results from March today I discussed the appropriate calcium  and vitamin D  intake in regard to her osteopenia.  A1c is at goal and her weight is trending down gradually.  Will increase Ozempic  from 1 mg to 2 mg at this time.  She plans to pick up her diabetic shoes within the next week. Review of Systems  Constitutional:  Negative for fever, malaise/fatigue and weight loss.  HENT: Negative.  Negative for nosebleeds.    Eyes: Negative.  Negative for blurred vision, double vision and photophobia.  Respiratory: Negative.  Negative for cough and shortness of breath.   Cardiovascular: Negative.  Negative for chest pain, palpitations and leg swelling.  Gastrointestinal: Negative.  Negative for heartburn, nausea and vomiting.  Musculoskeletal: Negative.  Negative for myalgias.  Neurological: Negative.  Negative for dizziness, focal weakness, seizures and headaches.  Psychiatric/Behavioral: Negative.  Negative for suicidal ideas.     Past Medical History:  Diagnosis Date   Arthritis    Right Knee   Diabetes mellitus without complication (HCC)    Hypertension    Seizures (HCC)     Past Surgical History:  Procedure Laterality Date   COLONOSCOPY  2021   ORIF FEMUR FRACTURE Left 04/24/2017   Procedure: OPEN REDUCTION INTERNAL FIXATION (ORIF) DISTAL FEMUR FRACTURE;  Surgeon: Timothy Ford, MD;  Location: [facility] OR;  Service: Orthopedics;  Laterality: Left;    Family History  Problem Relation Age of Onset   Hypertension Mother    Breast cancer Mother    Hypertension Sister    Diabetes Sister     Social History Reviewed with no changes to be made today.   Outpatient Medications Prior to Visit  Medication Sig Dispense Refill   amLODipine  (NORVASC ) 10 MG tablet Take 1 tablet (10 mg total) by mouth daily. 90 tablet 1   atenolol  (TENORMIN ) 50 MG tablet Take 1 tablet (50 mg total) by mouth daily. 90 tablet 1   Blood Glucose Monitoring Suppl (CONTOUR NEXT EZ) w/Device KIT Use to check blood sugar once  daily. 1 kit 0   glucose blood (CONTOUR NEXT TEST) test strip Use as directed to check blood sugar 3 times daily. 200 strip 3   hydrocortisone  cream 0.5 % Apply 1 Application topically 2 (two) times daily. 56 g 0   lisinopril  (ZESTRIL ) 20 MG tablet Take 1 tablet (20 mg total) by mouth daily. 90 tablet 1   Microlet Lancets MISC Use to check blood sugar once daily. 100 each 2   Multiple Vitamins-Minerals (WOMENS  50+ MULTI VITAMIN/MIN) TABS Take 1 tablet by mouth daily.     nystatin -triamcinolone  ointment (MYCOLOG) Apply 1 application. topically 2 (two) times daily. As needed under breasts and under stomach 100 g 0   rosuvastatin  (CRESTOR ) 10 MG tablet Take 1 tablet (10 mg total) by mouth daily. To lower cholesterol 90 tablet 1   senna-docusate (SENOKOT-S) 8.6-50 MG tablet Take 1-2 tablets by mouth daily. Stool softener 180 tablet 3   traZODone  (DESYREL ) 50 MG tablet Take 0.5-1 tablets (25-50 mg total) by mouth at bedtime as needed for sleep. 90 tablet 1   Semaglutide , 1 MG/DOSE, 4 MG/3ML SOPN Inject 1 mg as directed once a week. 3 mL 2   No facility-administered medications prior to visit.    Allergies  Allergen Reactions   Penicillins Nausea Only       Objective:    BP 124/73 (BP Location: Left Arm, Patient Position: Sitting, Cuff Size: Normal)   Pulse 73   Resp (!) 119   Ht 5\' 3"  (1.6 m)   Wt 185 lb 6.4 oz (84.1 kg)   SpO2 99%   BMI 32.84 kg/m  Wt Readings from Last 3 Encounters:  06/01/23 185 lb 6.4 oz (84.1 kg)  05/02/23 188 lb (85.3 kg)  04/12/23 183 lb (83 kg)    Physical Exam Vitals and nursing note reviewed.  Constitutional:      Appearance: She is well-developed.  HENT:     Head: Normocephalic and atraumatic.  Cardiovascular:     Rate and Rhythm: Normal rate and regular rhythm.     Heart sounds: Normal heart sounds. No murmur heard.    No friction rub. No gallop.  Pulmonary:     Effort: Pulmonary effort is normal. No tachypnea or respiratory distress.     Breath sounds: Normal breath sounds. No decreased breath sounds, wheezing, rhonchi or rales.  Chest:     Chest wall: No tenderness.  Abdominal:     General: Bowel sounds are normal.     Palpations: Abdomen is soft.  Musculoskeletal:        General: Normal range of motion.     Cervical back: Normal range of motion.  Skin:    General: Skin is warm and dry.  Neurological:     Mental Status: She is alert and  oriented to person, place, and time.     Coordination: Coordination normal.  Psychiatric:        Behavior: Behavior normal. Behavior is cooperative.        Thought Content: Thought content normal.        Judgment: Judgment normal.          Patient has been counseled extensively about nutrition and exercise as well as the importance of adherence with medications and regular follow-up. The patient was given clear instructions to go to ER or return to medical center if symptoms don't improve, worsen or new problems develop. The patient verbalized understanding.   Follow-up: Return for Follow-up in September as already scheduled.  Collins Dean, FNP-BC [facility] and [facility] [place], [place] [phone]   06/01/2023, 10:52 AM

## 2023-06-01 NOTE — Patient Instructions (Addendum)
 CALCIUM  1200mg  VITAMIN D  800 mg DAILY

## 2023-06-02 DIAGNOSIS — H35363 Drusen (degenerative) of macula, bilateral: Secondary | ICD-10-CM | POA: Diagnosis not present

## 2023-06-02 DIAGNOSIS — H2513 Age-related nuclear cataract, bilateral: Secondary | ICD-10-CM | POA: Diagnosis not present

## 2023-06-02 DIAGNOSIS — E119 Type 2 diabetes mellitus without complications: Secondary | ICD-10-CM | POA: Diagnosis not present

## 2023-06-03 ENCOUNTER — Other Ambulatory Visit: Payer: Self-pay

## 2023-06-10 ENCOUNTER — Other Ambulatory Visit (HOSPITAL_COMMUNITY): Payer: Self-pay

## 2023-06-10 ENCOUNTER — Other Ambulatory Visit: Payer: Self-pay

## 2023-06-10 NOTE — Patient Instructions (Signed)
 Visit Information  Thank you for taking time to visit with me today. Please don't hesitate to contact me if I can be of assistance to you before our next scheduled appointment.  Our next appointment is no further scheduled appointments.  Patient will follow with primary provider as scheduled. Please call the care guide team at [phone] if you need to cancel or reschedule your appointment.   Following is a copy of your care plan:   Goals Addressed             This Visit's Progress    COMPLETED: VBCI RN Care Plan related to food insecurity   On track    Problems:  Care Coordination needs related to Food Insecurity   Goal: Over the next 30 days the Patient will review and utilize food pantries provided by community care guide  Interventions:   Evaluation of current treatment plan related to DMII and HTN, food insecurity self-management and patient's adherence to plan as established by provider. Discussed plans with patient for ongoing care management follow up and provided patient with direct contact information for care management team Evaluation of current treatment plan related to DM and HTN and patient's adherence to plan as established by provider Reviewed medications with patient and discussed medication compliance Advised patient, providing education and rationale, to check cbg as advised by provider and record, calling primary provider for findings outside established parameters  Screening for signs and symptoms of depression related to chronic disease state  Assessed social determinant of health barriers  Patient Self-Care Activities:  Attend all scheduled provider appointments Call provider office for new concerns or questions  Perform all self care activities independently  Take medications as prescribed   Continue current plan of care for DM and HTN management  Plan:  No further follow up required: Patient has received food resources and plans to utilize. No other  needs identified by [facility]. Patient will continue to follow with primary provider as scheduled. The patient has been provided with contact information for the care management team and has been advised to call with any health related questions or concerns.              Please call the Suicide and Crisis Lifeline: 988 call 1-800-273-TALK (toll free, 24 hour hotline) if you are experiencing a Mental Health or Behavioral Health Crisis or need someone to talk to.  Patient verbalizes understanding of instructions and care plan provided today and agrees to view in MyChart. Active MyChart status and patient understanding of how to access instructions and care plan via MyChart confirmed with patient.     Theodora Fish, RN MSN [place]  VBCI Population Health RN Care Manager Direct Dial: [phone]  Fax: [phone]

## 2023-06-10 NOTE — Patient Outreach (Signed)
 Complex Care Management   Visit Note  06/10/2023  Name:  Christine Rocha MRN: [id number] DOB: 1955-04-03  Situation: Referral received for Complex Care Management related to SDOH Barriers:  Food insecurity I obtained verbal consent from Patient.  Visit completed with Francisca Irvine  on the phone  Background:   Past Medical History:  Diagnosis Date   Arthritis    Right Knee   Diabetes mellitus without complication (HCC)    Hypertension    Seizures (HCC)     Assessment: Patient Reported Symptoms:  Cognitive Cognitive Status: Able to follow simple commands, Alert and oriented to person, place, and time, Normal speech and language skills Cognitive/Intellectual Conditions Management [RPT]: None reported or documented in medical history or problem list   Health Maintenance Behaviors: Annual physical exam, Exercise  Neurological Neurological Review of Symptoms: No symptoms reported    HEENT HEENT Symptoms Reported: No symptoms reported HEENT Conditions: Tooth problem(s), Vision problem(s) Tooth Problems: missing (Upper and lower dentures) Vision Problems: blindness/vision loss (eyelgasses) HEENT Management Strategies: Medical device, Routine screening Tooth problem(s), Vision problem(s)  Cardiovascular Cardiovascular Symptoms Reported: No symptoms reported Does patient have uncontrolled Hypertension?: No Cardiovascular Conditions: Hypertension Cardiovascular Management Strategies: Medication therapy, Exercise  Respiratory Respiratory Symptoms Reported: No symptoms reported    Endocrine Patient reports the following symptoms related to hypoglycemia or hyperglycemia : No symptoms reported Is patient diabetic?: Yes Is patient checking blood sugars at home?: Yes Endocrine Conditions: Diabetes Endocrine Management Strategies: Medication therapy, Exercise, Weight management  Gastrointestinal Gastrointestinal Symptoms Reported: No symptoms reported (Last BM "couple of days ago") Additional  Gastrointestinal Details: Patient reports a good appetite Gastrointestinal Management Strategies: Medication therapy Nutrition Risk Screen (CP): No indicators present  Genitourinary Genitourinary Symptoms Reported: No symptoms reported    Integumentary Integumentary Symptoms Reported: No symptoms reported    Musculoskeletal Musculoskelatal Symptoms Reviewed: No symptoms reported Additional Musculoskeletal Details: Patient reports pain in her L leg "where that rod is" when it is rainy or cold. Musculoskeletal Conditions: Osteoarthritis Musculoskeletal Management Strategies: Medical device Musculoskeletal Comment: Patient walks with her cane every day. She uses a walker as needed. Falls in the past year?: No Number of falls in past year: 1 or less Was there an injury with Fall?: No Fall Risk Category Calculator: 0 Patient Fall Risk Level: Low Fall Risk Patient at Risk for Falls Due to: No Fall Risks Fall risk Follow up: Falls evaluation completed  Psychosocial Psychosocial Symptoms Reported: No symptoms reported Behavioral Health Conditions: Depression   Quality of Family Relationships: helpful, involved, supportive Do you feel physically threatened by others?: No      06/10/2023    9:24 AM  Depression screen PHQ 2/9  Decreased Interest 0  Down, Depressed, Hopeless 0  PHQ - 2 Score 0    There were no vitals filed for this visit.  Medications Reviewed Today     Reviewed by Valaria Garland, RN (Registered Nurse) on 06/10/23 at [phone]  Med List Status: <None>   Medication Order Taking? Sig Documenting Provider Last Dose Status Informant  amLODipine  (NORVASC ) 10 MG tablet [id number] Yes Take 1 tablet (10 mg total) by mouth daily. Collins Dean, NP Taking Active   atenolol  (TENORMIN ) 50 MG tablet [id number] Yes Take 1 tablet (50 mg total) by mouth daily. Collins Dean, NP Taking Active   Blood Glucose Monitoring Suppl (CONTOUR NEXT EZ) w/Device KIT [id number] Yes Use to check  blood sugar once daily. Newlin, Enobong, MD Taking Active   glucose blood (CONTOUR  NEXT TEST) test strip [id number] Yes Use as directed to check blood sugar 3 times daily. Collins Dean, NP Taking Active   hydrocortisone  cream 0.5 % [id number] Yes Apply 1 Application topically 2 (two) times daily. Newlin, Enobong, MD Taking Active   lisinopril  (ZESTRIL ) 20 MG tablet [id number] Yes Take 1 tablet (20 mg total) by mouth daily. Collins Dean, NP Taking Active   Microlet Lancets MISC [id number] Yes Use to check blood sugar once daily. Collins Dean, NP Taking Active   Multiple Vitamins-Minerals (WOMENS 50+ MULTI VITAMIN/MIN) TABS [id number] Yes Take 1 tablet by mouth daily. [provider] Taking Active Self  nystatin -triamcinolone  ointment (MYCOLOG) [id number] Yes Apply 1 application. topically 2 (two) times daily. As needed under breasts and under stomach Collins Dean, NP Taking Active   rosuvastatin  (CRESTOR ) 10 MG tablet [id number] Yes Take 1 tablet (10 mg total) by mouth daily. To lower cholesterol Collins Dean, NP Taking Active   Semaglutide , 2 MG/DOSE, 8 MG/3ML SOPN [id number]  Inject 2 mg as directed once a week. Collins Dean, NP  Active   senna-docusate (SENOKOT-S) 8.6-50 MG tablet [id number]  Take 1-2 tablets by mouth daily. Stool softener Fleming, Zelda W, NP  Active   traZODone  (DESYREL ) 50 MG tablet [id number]  Take 0.5-1 tablets (25-50 mg total) by mouth at bedtime as needed for sleep. Collins Dean, NP  Active             Recommendation:   Continue Current Plan of Care  Follow Up Plan:   Closing From:  Complex Care Management Patient has met all care management goals. Care Management case will be closed. Patient has been provided contact information should new needs arise.   Theodora Fish, RN MSN [place]  VBCI Population Health RN Care Manager Direct Dial: [phone]  Fax: [phone]

## 2023-06-12 DIAGNOSIS — E119 Type 2 diabetes mellitus without complications: Secondary | ICD-10-CM | POA: Diagnosis not present

## 2023-06-27 ENCOUNTER — Other Ambulatory Visit: Payer: Self-pay

## 2023-06-30 ENCOUNTER — Encounter: Payer: Self-pay | Admitting: Dietician

## 2023-06-30 ENCOUNTER — Other Ambulatory Visit: Payer: Self-pay

## 2023-06-30 ENCOUNTER — Encounter: Payer: Medicare Other | Attending: Internal Medicine | Admitting: Dietician

## 2023-06-30 DIAGNOSIS — E1165 Type 2 diabetes mellitus with hyperglycemia: Secondary | ICD-10-CM | POA: Insufficient documentation

## 2023-06-30 NOTE — Patient Instructions (Addendum)
 Consider chocolate almond milk rather than [employer] as the almond milk has less calories and carbohydrates   Continue to stay active  Walking the dog daily Weight or bands twice per week (home, senior center, or [employer])   Eat more vegetables by making a vegetable soup once per week   Keep taking your medications as prescribed Continue to check your blood glucose daily as recommended   Great job on reducing your smoking!

## 2023-06-30 NOTE — Progress Notes (Signed)
 Diabetes Self-Management Education  Visit Type: Follow-up  Appt. Start Time: 1102 Appt. End Time: 1132  06/30/2023  [name], identified by name and date of birth, is a 68 y.o. female with a diagnosis of Diabetes:  .   ASSESSMENT Patient is here today alone.  She was last seen by this RD on 01/20/2023.   Fasting 107 today and 133 after breakfast Walking with a cane or walker.  Uses the city bus. Smoking 4 cigarettes per day - she states that she no longer buys them and goes some days without smoking.  She will eat a sugar free candy. No longer allows others to smoke in the house.  History includes:  Type 2 Diabetes, HTN, arthritis, smokes Medications include:  Metformin  (stopped), Lantus  (stopped),  Semaglutide  1 mg (to increase to 2 mg) Labs noted:  6.6% 03/23/2023, EGFR 88 03/23/2023   Weight: 63 188 lbs 06/30/2023 191 lbs 01/18/2022 187 lbs 08/02/2022 193 lbs 05/17/2022 183 lbs 11/16/2021 186 lbs 09/07/2021   182 lbs 06/16/2021 190 lbs 04/13/2021 (lost with uncontrolled glucose) 197 lbs 02/17/21 189 lbs 11/28/2020   Patient lives with her husband.  They share the shopping and cooking.  They used to eat out most of the time (fast food) but have changed to TV dinners most frequently since diabetes diagnosis. They rely on public transportation. She states that some vegetables (broccoli, cauliflower, and squash s) don't agree with her - make her want to vomit.  She enjoys cabbage and turnip greens.  Prefers canned food rather than frozen. She is on disability.  Walks with a cane or walker.   She smokes and has tried to quit without success. Food insecurity and relies on food banks to supplement.  Skips dinner a couple times a week due to money.  She was given the shopping at the dollar tree handout on previous visit. Food stamps but not enough to last ($23 per month).  She does use a food pantry. Husband is trying to finalize retirement and reduce his work. Continues to walk her dog  daily.  States that she is walking a lot.    Diabetes Self-Management Education - 06/30/23 1713       Visit Information   Visit Type Follow-up      Psychosocial Assessment   Patient Belief/Attitude about Diabetes Motivated to manage diabetes    What is the hardest part about your diabetes right now, causing you the most concern, or is the most worrisome to you about your diabetes?   Making healty food and beverage choices    Self-care barriers Lack of material resources    Self-management support Doctor's office;CDE visits    Other persons present Patient    Patient Concerns Nutrition/Meal planning;Glycemic Control    Special Needs Simplified materials    Preferred Learning Style No preference indicated    Learning Readiness Ready      Pre-Education Assessment   Patient understands the diabetes disease and treatment process. Comprehends key points    Patient understands incorporating nutritional management into lifestyle. Needs Review    Patient undertands incorporating physical activity into lifestyle. Comprehends key points    Patient understands using medications safely. Comprehends key points    Patient understands monitoring blood glucose, interpreting and using results Comprehends key points    Patient understands prevention, detection, and treatment of acute complications. Comprehends key points    Patient understands prevention, detection, and treatment of chronic complications. Compreheands key points    Patient understands how  to develop strategies to address psychosocial issues. Comprehends key points    Patient understands how to develop strategies to promote health/change behavior. Needs Review      Complications   Last HgB A1C per patient/outside source 6.6 %   03/23/2023   How often do you check your blood sugar? 1-2 times/day    Fasting Blood glucose range (mg/dL) 16-109    Postprandial Blood glucose range (mg/dL) 604-540    Number of hypoglycemic episodes per month  0      Dietary Intake   Breakfast Pancakes with regular syrup from [employer] with lite bread, mac and cheese, greens    Dinner Canned vegetable soup, banana, mayo sandwich    Beverage(s) Water, chocolate milk (almond or regular), occasional diet soda      Activity / Exercise   Activity / Exercise Type Light (walking / raking leaves)    How many days per week do you exercise? 7    How many minutes per day do you exercise? 30    Total minutes per week of exercise 210      Patient Education   Previous Diabetes Education Yes (please comment)   01/2023   Healthy Eating Meal options for control of blood glucose level and chronic complications.    Being Active Role of exercise on diabetes management, blood pressure control and cardiac health.;Helped patient identify appropriate exercises in relation to his/her diabetes, diabetes complications and other health issue.    Medications Reviewed patients medication for diabetes, action, purpose, timing of dose and side effects.    Monitoring Taught/evaluated SMBG meter.;Identified appropriate SMBG and/or A1C goals.    Diabetes Stress and Support Identified and addressed patients feelings and concerns about diabetes;Worked with patient to identify barriers to care and solutions      Individualized Goals (developed by patient)   Nutrition General guidelines for healthy choices and portions discussed    Physical Activity Exercise 5-7 days per week;30 minutes per day    Medications take my medication as prescribed    Monitoring  Test my blood glucose as discussed    Problem Solving Eating Pattern    Reducing Risk examine blood glucose patterns;do foot checks daily;treat hypoglycemia with 15 grams of carbs if blood glucose less than 70mg /dL      Patient Self-Evaluation of Goals - Patient rates self as meeting previously set goals (% of time)   Nutrition 50 - 75 % (half of the time)    Physical Activity >75% (most of the time)     Medications >75% (most of the time)    Monitoring >75% (most of the time)    Problem Solving and behavior change strategies  >75% (most of the time)    Reducing Risk (treating acute and chronic complications) >75% (most of the time)    Health Coping >75% (most of the time)      Post-Education Assessment   Patient understands the diabetes disease and treatment process. Demonstrates understanding / competency    Patient understands incorporating nutritional management into lifestyle. Needs Review    Patient undertands incorporating physical activity into lifestyle. Demonstrates understanding / competency    Patient understands using medications safely. Demonstrates understanding / competency    Patient understands monitoring blood glucose, interpreting and using results Demonstrates understanding / competency    Patient understands prevention, detection, and treatment of acute complications. Demonstrates understanding / competency    Patient understands prevention, detection, and treatment of chronic complications. Demonstrates understanding /  competency    Patient understands how to develop strategies to address psychosocial issues. Demonstrates understanding / competency    Patient understands how to develop strategies to promote health/change behavior. Needs Review      Outcomes   Expected Outcomes Demonstrated interest in learning. Expect positive outcomes    Future DMSE 3-4 months    Program Status Not Completed      Subsequent Visit   Since your last visit have you experienced any weight changes? Loss    Weight Loss (lbs) 3    Since your last visit, are you checking your blood glucose at least once a day? Yes          Individualized Plan for Diabetes Self-Management Training:   Learning Objective:  Patient will have a greater understanding of diabetes self-management. Patient education plan is to attend individual and/or group sessions per assessed needs and concerns.   Plan:    Patient Instructions  Consider chocolate almond milk rather than [employer] as the almond milk has less calories and carbohydrates   Continue to stay active  Walking the dog daily Weight or bands twice per week (home, senior center, or [facility])   Eat more vegetables by making a vegetable soup once per week   Keep taking your medications as prescribed Continue to check your blood glucose daily as recommended   Great job on reducing your smoking!  Expected Outcomes:  Demonstrated interest in learning. Expect positive outcomes  Education material provided:   If problems or questions, patient to contact team via:  Phone  Future DSME appointment: 3-4 months

## 2023-07-04 ENCOUNTER — Ambulatory Visit (INDEPENDENT_AMBULATORY_CARE_PROVIDER_SITE_OTHER)

## 2023-07-04 DIAGNOSIS — L84 Corns and callosities: Secondary | ICD-10-CM | POA: Diagnosis not present

## 2023-07-04 DIAGNOSIS — E1142 Type 2 diabetes mellitus with diabetic polyneuropathy: Secondary | ICD-10-CM | POA: Diagnosis not present

## 2023-07-04 DIAGNOSIS — M2141 Flat foot [pes planus] (acquired), right foot: Secondary | ICD-10-CM | POA: Diagnosis not present

## 2023-07-04 DIAGNOSIS — M2142 Flat foot [pes planus] (acquired), left foot: Secondary | ICD-10-CM | POA: Diagnosis not present

## 2023-07-04 NOTE — Progress Notes (Signed)

## 2023-07-12 DIAGNOSIS — E119 Type 2 diabetes mellitus without complications: Secondary | ICD-10-CM | POA: Diagnosis not present

## 2023-07-18 ENCOUNTER — Other Ambulatory Visit: Payer: Self-pay

## 2023-07-19 ENCOUNTER — Other Ambulatory Visit: Payer: Self-pay

## 2023-08-04 ENCOUNTER — Other Ambulatory Visit: Payer: Self-pay

## 2023-08-05 ENCOUNTER — Other Ambulatory Visit: Payer: Self-pay

## 2023-08-08 ENCOUNTER — Other Ambulatory Visit (HOSPITAL_COMMUNITY): Payer: Self-pay

## 2023-08-11 ENCOUNTER — Other Ambulatory Visit: Payer: Self-pay

## 2023-08-12 DIAGNOSIS — E119 Type 2 diabetes mellitus without complications: Secondary | ICD-10-CM | POA: Diagnosis not present

## 2023-08-15 ENCOUNTER — Other Ambulatory Visit: Payer: Self-pay

## 2023-08-16 ENCOUNTER — Encounter: Payer: Self-pay | Admitting: Podiatry

## 2023-08-16 ENCOUNTER — Ambulatory Visit (INDEPENDENT_AMBULATORY_CARE_PROVIDER_SITE_OTHER): Admitting: Podiatry

## 2023-08-16 ENCOUNTER — Ambulatory Visit: Admitting: Podiatry

## 2023-08-16 DIAGNOSIS — L84 Corns and callosities: Secondary | ICD-10-CM | POA: Diagnosis not present

## 2023-08-16 DIAGNOSIS — E1142 Type 2 diabetes mellitus with diabetic polyneuropathy: Secondary | ICD-10-CM

## 2023-08-16 DIAGNOSIS — B351 Tinea unguium: Secondary | ICD-10-CM

## 2023-08-16 DIAGNOSIS — M79676 Pain in unspecified toe(s): Secondary | ICD-10-CM | POA: Diagnosis not present

## 2023-08-16 DIAGNOSIS — Z91198 Patient's noncompliance with other medical treatment and regimen for other reason: Secondary | ICD-10-CM

## 2023-08-17 NOTE — Progress Notes (Signed)
 1. Failure to attend appointment with reason given    Patient rescheduled to another time slot due to power outage this morning.

## 2023-08-21 NOTE — Progress Notes (Signed)
  Subjective:  Patient ID: Christine Rocha, female    DOB: 22-Mar-1955,  MRN: [id number]  68 y.o. female presents at risk foot care with history of diabetic neuropathy and callus(es) of both feet and painful mycotic toenails that are difficult to trim. Painful toenails interfere with ambulation. Aggravating factors include wearing enclosed shoe gear. Pain is relieved with periodic professional debridement. Painful calluses are aggravated when weightbearing with and without shoegear. Pain is relieved with periodic professional debridement.  Chief Complaint  Patient presents with   Nail Problem    Thick painful toenails, 3 month follow up    New problem(s): None   PCP is Theotis Haze ORN, NP , and last visit was Jun 01, 2023.  Allergies  Allergen Reactions   Penicillins Nausea Only    Review of Systems: Negative except as noted in the HPI.   Objective:  Christine Rocha is a pleasant 68 y.o. female in NAD. AAO x 3.  Vascular Examination: Vascular status intact b/l with palpable pedal pulses. CFT immediate b/l. Pedal hair present. No edema. No pain with calf compression b/l. Skin temperature gradient WNL b/l. No varicosities noted. No cyanosis or clubbing noted.  Neurological Examination: Pt has subjective symptoms of neuropathy. Protective sensation decreased with 10 gram monofilament b/l.  Dermatological Examination: Pedal skin with normal turgor, texture and tone b/l. No open wounds nor interdigital macerations noted. Toenails 1-5 b/l thick, discolored, elongated with subungual debris and pain on dorsal palpation.   Hyperkeratotic lesion(s) medial IPJ of left great toe and medial IPJ of right great toe.  No erythema, no edema, no drainage, no fluctuance.  Musculoskeletal Examination: Muscle strength 5/5 to b/l LE.  No pain, crepitus noted b/l. Pes planus b/l. Uses cane for mobility. Radiographs: None  Last A1c:      Latest Ref Rng & Units 03/23/2023    2:56 PM 09/27/2022    8:57  AM  Hemoglobin A1C  Hemoglobin-A1c 4.8 - 5.6 % 6.6  6.8      Assessment:   1. Pain due to onychomycosis of toenail   2. Callus   3. Diabetic peripheral neuropathy associated with type 2 diabetes mellitus (HCC)    Plan:  Consent given for treatment. Patient examined.All patient's and/or POA's questions/concerns addressed on today's visit. Toenails 1-5 debrided in length and girth without incident. Callus(es) medial IPJ of right great toe and plantar IPJ of left great toe pared with sharp debridement without incident. Continue foot and shoe inspections daily. Monitor blood glucose per PCP/Endocrinologist's recommendations.Continue soft, supportive shoe gear daily. Report any pedal injuries to medical professional. Call office if there are any questions/concerns.  Return in about 3 months (around 11/16/2023).  Delon LITTIE Merlin, DPM      [place] LOCATION: [address] [place] [address]                   Office [phone]   [facility] LOCATION: [address] [place], [place] [address] Office [phone]

## 2023-08-29 ENCOUNTER — Other Ambulatory Visit: Payer: Self-pay

## 2023-09-12 DIAGNOSIS — E119 Type 2 diabetes mellitus without complications: Secondary | ICD-10-CM | POA: Diagnosis not present

## 2023-09-21 ENCOUNTER — Telehealth: Payer: Self-pay | Admitting: Nurse Practitioner

## 2023-09-21 NOTE — Telephone Encounter (Signed)
 pt unconfirmed appt 9/10 (per vr)

## 2023-09-23 ENCOUNTER — Ambulatory Visit: Attending: Nurse Practitioner | Admitting: Nurse Practitioner

## 2023-09-23 ENCOUNTER — Other Ambulatory Visit: Payer: Self-pay

## 2023-09-23 ENCOUNTER — Encounter: Payer: Self-pay | Admitting: Nurse Practitioner

## 2023-09-23 VITALS — BP 129/69 | HR 69 | Resp 19 | Ht 63.0 in | Wt 186.0 lb

## 2023-09-23 DIAGNOSIS — I10 Essential (primary) hypertension: Secondary | ICD-10-CM | POA: Diagnosis not present

## 2023-09-23 DIAGNOSIS — E1165 Type 2 diabetes mellitus with hyperglycemia: Secondary | ICD-10-CM | POA: Diagnosis not present

## 2023-09-23 DIAGNOSIS — Z23 Encounter for immunization: Secondary | ICD-10-CM

## 2023-09-23 DIAGNOSIS — E782 Mixed hyperlipidemia: Secondary | ICD-10-CM

## 2023-09-23 DIAGNOSIS — R7989 Other specified abnormal findings of blood chemistry: Secondary | ICD-10-CM | POA: Diagnosis not present

## 2023-09-23 DIAGNOSIS — Z7985 Long-term (current) use of injectable non-insulin antidiabetic drugs: Secondary | ICD-10-CM

## 2023-09-23 DIAGNOSIS — Z79899 Other long term (current) drug therapy: Secondary | ICD-10-CM

## 2023-09-23 MED ORDER — SEMAGLUTIDE (2 MG/DOSE) 8 MG/3ML ~~LOC~~ SOPN
2.0000 mg | PEN_INJECTOR | SUBCUTANEOUS | 2 refills | Status: DC
  Filled 2023-09-23 – 2023-09-27 (×2): qty 3, 28d supply, fill #0
  Filled 2023-10-24: qty 3, 28d supply, fill #1
  Filled 2023-11-21: qty 3, 28d supply, fill #2

## 2023-09-23 MED ORDER — ROSUVASTATIN CALCIUM 10 MG PO TABS
10.0000 mg | ORAL_TABLET | Freq: Every day | ORAL | 1 refills | Status: DC
  Filled 2023-09-23 – 2023-10-13 (×2): qty 90, 90d supply, fill #0

## 2023-09-23 MED ORDER — AMLODIPINE BESYLATE 10 MG PO TABS
10.0000 mg | ORAL_TABLET | Freq: Every day | ORAL | 1 refills | Status: DC
  Filled 2023-09-23 – 2023-10-13 (×2): qty 90, 90d supply, fill #0

## 2023-09-23 MED ORDER — ATENOLOL 50 MG PO TABS
50.0000 mg | ORAL_TABLET | Freq: Every day | ORAL | 1 refills | Status: DC
  Filled 2023-09-23 – 2023-10-13 (×2): qty 90, 90d supply, fill #0

## 2023-09-23 MED ORDER — LISINOPRIL 20 MG PO TABS
20.0000 mg | ORAL_TABLET | Freq: Every day | ORAL | 1 refills | Status: DC
  Filled 2023-09-23 – 2023-10-13 (×2): qty 90, 90d supply, fill #0

## 2023-09-23 NOTE — Progress Notes (Signed)
 Assessment & Plan:  Christine Rocha was seen today for hypertension.  Diagnoses and all orders for this visit:  Primary hypertension -     amLODipine  (NORVASC ) 10 MG tablet; Take 1 tablet (10 mg total) by mouth daily. -     atenolol  (TENORMIN ) 50 MG tablet; Take 1 tablet (50 mg total) by mouth daily. -     lisinopril  (ZESTRIL ) 20 MG tablet; Take 1 tablet (20 mg total) by mouth daily. Continue all antihypertensives as prescribed.  Reminded to bring in blood pressure log for follow  up appointment.  RECOMMENDATIONS: DASH/Mediterranean Diets are healthier choices for HTN.    Type 2 diabetes mellitus with hyperglycemia, without long-term current use of insulin  (HCC) -     Semaglutide , 2 MG/DOSE, 8 MG/3ML SOPN; Inject 2 mg as directed once a week. -     Hemoglobin A1c -     CMP14+EGFR Continue blood sugar control as discussed in office today, low carbohydrate diet, and regular physical exercise as tolerated, 150 minutes per week (30 min each day, 5 days per week, or 50 min 3 days per week). Keep blood sugar logs with fasting goal of 90-130 mg/dl, post prandial (after you eat) less than 180.  For Hypoglycemia: BS <60 and Hyperglycemia BS >400; contact the clinic ASAP. Annual eye exams and foot exams are recommended.   Abnormal CBC -     CBC with Differential/Platelet  Flu vaccine need -     Flu vaccine trivalent PF, 6mos and older(Flulaval,Afluria,Fluarix,Fluzone)  Mixed hyperlipidemia -     rosuvastatin  (CRESTOR ) 10 MG tablet; Take 1 tablet (10 mg total) by mouth daily. To lower cholesterol    Patient has been counseled on age-appropriate routine health concerns for screening and prevention. These are reviewed and up-to-date. Referrals have been placed accordingly. Immunizations are up-to-date or declined.    Subjective:   Chief Complaint  Patient presents with   Hypertension    Christine Rocha 68 y.o. female presents to office today for follow up to HTN  Patient has been counseled on  age-appropriate routine health concerns for screening and prevention. These are reviewed and up-to-date. Referrals have been placed accordingly. Immunizations are up-to-date or declined.     MAMMOGRAM: Up-to-date Colon cancer screening: Up today Ophthalmology exam: Overdue.  She has appointment scheduled for tomorrow Pap smear: N/A      HTN Blood pressure is well controlled.  Currently taking as prescribed atenolol , amlodipine , lisinopril  BP Readings from Last 3 Encounters:  09/23/23 129/69  06/01/23 124/73  05/02/23 132/76    She is currently taking calcium  and vitamin D  600 mg of calcium  in 400 mg of vitamin D .  I have instructed her to increase her dose of calcium  to 1200 mg and 800 mg of vitamin D  due to her recent diagnosis of osteopenia.    Review of Systems  Constitutional:  Negative for fever, malaise/fatigue and weight loss.  HENT: Negative.  Negative for nosebleeds.   Eyes: Negative.  Negative for blurred vision, double vision and photophobia.  Respiratory: Negative.  Negative for cough and shortness of breath.   Cardiovascular: Negative.  Negative for chest pain, palpitations and leg swelling.  Gastrointestinal: Negative.  Negative for heartburn, nausea and vomiting.  Musculoskeletal: Negative.  Negative for myalgias.  Neurological: Negative.  Negative for dizziness, focal weakness, seizures and headaches.  Psychiatric/Behavioral: Negative.  Negative for suicidal ideas.     Past Medical History:  Diagnosis Date   Arthritis    Right Knee  Diabetes mellitus without complication (HCC)    Hypertension    Seizures (HCC)     Past Surgical History:  Procedure Laterality Date   COLONOSCOPY  2021   ORIF FEMUR FRACTURE Left 04/24/2017   Procedure: OPEN REDUCTION INTERNAL FIXATION (ORIF) DISTAL FEMUR FRACTURE;  Surgeon: Harden Jerona GAILS, MD;  Location: [facility] OR;  Service: Orthopedics;  Laterality: Left;    Family History  Problem Relation Age of Onset   Hypertension Mother     Breast cancer Mother    Hypertension Sister    Diabetes Sister     Social History Reviewed with no changes to be made today.   Outpatient Medications Prior to Visit  Medication Sig Dispense Refill   Blood Glucose Monitoring Suppl (CONTOUR NEXT EZ) w/Device KIT Use to check blood sugar once daily. 1 kit 0   glucose blood (CONTOUR NEXT TEST) test strip Use as directed to check blood sugar 3 times daily. 200 strip 3   hydrocortisone  cream 0.5 % Apply 1 Application topically 2 (two) times daily. 56 g 0   Microlet Lancets MISC Use to check blood sugar once daily. 100 each 2   Multiple Vitamins-Minerals (WOMENS 50+ MULTI VITAMIN/MIN) TABS Take 1 tablet by mouth daily.     nystatin -triamcinolone  ointment (MYCOLOG) Apply 1 application. topically 2 (two) times daily. As needed under breasts and under stomach 100 g 0   senna-docusate (SENOKOT-S) 8.6-50 MG tablet Take 1-2 tablets by mouth daily. Stool softener 180 tablet 3   traZODone  (DESYREL ) 50 MG tablet Take 0.5-1 tablets (25-50 mg total) by mouth at bedtime as needed for sleep. 90 tablet 1   amLODipine  (NORVASC ) 10 MG tablet Take 1 tablet (10 mg total) by mouth daily. 90 tablet 1   atenolol  (TENORMIN ) 50 MG tablet Take 1 tablet (50 mg total) by mouth daily. 90 tablet 1   lisinopril  (ZESTRIL ) 20 MG tablet Take 1 tablet (20 mg total) by mouth daily. 90 tablet 1   rosuvastatin  (CRESTOR ) 10 MG tablet Take 1 tablet (10 mg total) by mouth daily. To lower cholesterol 90 tablet 1   Semaglutide , 2 MG/DOSE, 8 MG/3ML SOPN Inject 2 mg as directed once a week. 3 mL 2   No facility-administered medications prior to visit.    Allergies  Allergen Reactions   Penicillins Nausea Only       Objective:    BP 129/69 (BP Location: Left Arm, Patient Position: Sitting, Cuff Size: Normal)   Pulse 69   Resp 19   Ht 5' 3 (1.6 m)   Wt 186 lb (84.4 kg)   SpO2 100%   BMI 32.95 kg/m  Wt Readings from Last 3 Encounters:  09/23/23 186 lb (84.4 kg)  06/01/23  185 lb 6.4 oz (84.1 kg)  05/02/23 188 lb (85.3 kg)    Physical Exam Vitals and nursing note reviewed.  Constitutional:      Appearance: She is well-developed.  HENT:     Head: Normocephalic and atraumatic.  Cardiovascular:     Rate and Rhythm: Normal rate and regular rhythm.     Heart sounds: Normal heart sounds. No murmur heard.    No friction rub. No gallop.  Pulmonary:     Effort: Pulmonary effort is normal. No tachypnea or respiratory distress.     Breath sounds: Normal breath sounds. No decreased breath sounds, wheezing, rhonchi or rales.  Chest:     Chest wall: No tenderness.  Abdominal:     General: Bowel sounds are normal.  Palpations: Abdomen is soft.  Musculoskeletal:        General: Normal range of motion.     Cervical back: Normal range of motion.  Skin:    General: Skin is warm and dry.  Neurological:     Mental Status: She is alert and oriented to person, place, and time.     Coordination: Coordination normal.  Psychiatric:        Behavior: Behavior normal. Behavior is cooperative.        Thought Content: Thought content normal.        Judgment: Judgment normal.          Patient has been counseled extensively about nutrition and exercise as well as the importance of adherence with medications and regular follow-up. The patient was given clear instructions to go to ER or return to medical center if symptoms don't improve, worsen or new problems develop. The patient verbalized understanding.   Follow-up: Return in about 3 months (around 01/05/2024).   Haze LELON Servant, FNP-BC [facility] [place], [place] [phone]   09/23/2023, 2:05 PM

## 2023-09-24 LAB — HEMOGLOBIN A1C
Est. average glucose Bld gHb Est-mCnc: 140 mg/dL
Hgb A1c MFr Bld: 6.5 % — ABNORMAL HIGH (ref 4.8–5.6)

## 2023-09-24 LAB — CBC WITH DIFFERENTIAL/PLATELET
Basophils Absolute: 0 x10E3/uL (ref 0.0–0.2)
Basos: 1 %
EOS (ABSOLUTE): 0.1 x10E3/uL (ref 0.0–0.4)
Eos: 2 %
Hematocrit: 40.2 % (ref 34.0–46.6)
Hemoglobin: 13 g/dL (ref 11.1–15.9)
Immature Grans (Abs): 0 x10E3/uL (ref 0.0–0.1)
Immature Granulocytes: 0 %
Lymphocytes Absolute: 2.4 x10E3/uL (ref 0.7–3.1)
Lymphs: 41 %
MCH: 30.2 pg (ref 26.6–33.0)
MCHC: 32.3 g/dL (ref 31.5–35.7)
MCV: 94 fL (ref 79–97)
Monocytes Absolute: 0.5 x10E3/uL (ref 0.1–0.9)
Monocytes: 8 %
Neutrophils Absolute: 2.8 x10E3/uL (ref 1.4–7.0)
Neutrophils: 48 %
Platelets: 276 x10E3/uL (ref 150–450)
RBC: 4.3 x10E6/uL (ref 3.77–5.28)
RDW: 12.6 % (ref 11.7–15.4)
WBC: 5.9 x10E3/uL (ref 3.4–10.8)

## 2023-09-24 LAB — CMP14+EGFR
ALT: 18 IU/L (ref 0–32)
AST: 12 IU/L (ref 0–40)
Albumin: 4.5 g/dL (ref 3.9–4.9)
Alkaline Phosphatase: 67 IU/L (ref 44–121)
BUN/Creatinine Ratio: 12 (ref 12–28)
BUN: 10 mg/dL (ref 8–27)
Bilirubin Total: 0.2 mg/dL (ref 0.0–1.2)
CO2: 24 mmol/L (ref 20–29)
Calcium: 9.8 mg/dL (ref 8.7–10.3)
Chloride: 104 mmol/L (ref 96–106)
Creatinine, Ser: 0.81 mg/dL (ref 0.57–1.00)
Globulin, Total: 2.4 g/dL (ref 1.5–4.5)
Glucose: 86 mg/dL (ref 70–99)
Potassium: 4.5 mmol/L (ref 3.5–5.2)
Sodium: 142 mmol/L (ref 134–144)
Total Protein: 6.9 g/dL (ref 6.0–8.5)
eGFR: 80 mL/min/1.73 (ref >59)

## 2023-09-27 ENCOUNTER — Other Ambulatory Visit: Payer: Self-pay

## 2023-09-28 ENCOUNTER — Ambulatory Visit: Payer: Self-pay | Admitting: Nurse Practitioner

## 2023-09-29 ENCOUNTER — Other Ambulatory Visit: Payer: Self-pay

## 2023-10-03 ENCOUNTER — Other Ambulatory Visit: Payer: Self-pay

## 2023-10-12 DIAGNOSIS — E119 Type 2 diabetes mellitus without complications: Secondary | ICD-10-CM | POA: Diagnosis not present

## 2023-10-13 ENCOUNTER — Other Ambulatory Visit: Payer: Self-pay

## 2023-10-24 ENCOUNTER — Other Ambulatory Visit: Payer: Self-pay

## 2023-11-03 ENCOUNTER — Telehealth: Payer: Self-pay

## 2023-11-03 NOTE — Telephone Encounter (Signed)
 What is she requesting???? She saw the nutritionist last year

## 2023-11-03 NOTE — Telephone Encounter (Signed)
 Return call unanswered by patient. Voicemail left to return call.

## 2023-11-03 NOTE — Telephone Encounter (Signed)
 Copied from CRM [id number]. Topic: Referral - Request for Referral >> Nov 03, 2023 10:12 AM Turkey B wrote: Did the patient discuss referral with their provider in the last year? yes Patient needs updated referral, previous one has expired  Type of order/referral and detailed reason for visit: diabetes  d E11.65  Preference of office, provider, location: ?  If referral order, have you been seen by this specialty before? yes (If Yes, this issue or another issue? When? Where? Yes nutrition and diabetes education  Can we respond through MyChart? yes

## 2023-11-04 ENCOUNTER — Ambulatory Visit: Admitting: Dietician

## 2023-11-10 ENCOUNTER — Other Ambulatory Visit: Payer: Self-pay | Admitting: Nurse Practitioner

## 2023-11-10 DIAGNOSIS — E1165 Type 2 diabetes mellitus with hyperglycemia: Secondary | ICD-10-CM

## 2023-11-10 NOTE — Telephone Encounter (Signed)
 Order placed

## 2023-11-10 NOTE — Telephone Encounter (Unsigned)
 Copied from CRM [id number]. Topic: Referral - Question >> Nov 10, 2023 10:08 AM Leonette SQUIBB wrote: Reason for CRM: Precious at [facility] called for an updated referral for the patient  CB# [phone]

## 2023-11-11 ENCOUNTER — Encounter: Payer: Self-pay | Admitting: Dietician

## 2023-11-11 ENCOUNTER — Encounter: Attending: Nurse Practitioner | Admitting: Dietician

## 2023-11-11 VITALS — Wt 184.0 lb

## 2023-11-11 DIAGNOSIS — E1165 Type 2 diabetes mellitus with hyperglycemia: Secondary | ICD-10-CM | POA: Diagnosis not present

## 2023-11-11 NOTE — Patient Instructions (Signed)
 Keep up the good work! Stay active Continue to take your medication Continue to check your blood glucose Continue to eat vegetables (1/2 your plate when able)

## 2023-11-11 NOTE — Progress Notes (Signed)
 Medical Nutrition Therapy:  Appt start time: 1330 end time:  1350. Patient is here today alone.  She was last seen by this RD on 01/20/2023.  Sister is currently helping her with food as patient is not getting her food stamps due to government shutdown.  Fasting 102 today and 134 after breakfast.  74% in range per blood glucose meter.  She checks her blood sugar 6 times per day.  Walking with a cane or walker.  Uses the city bus. Smoking 4 cigarettes per day - she states that she no longer buys them and goes some days without smoking.  She will eat a sugar free candy. No longer allows others to smoke in the house.   History includes:  Type 2 Diabetes, HTN, arthritis, smokes Medications include:  Metformin  (stopped), Lantus  (stopped),  Semaglutide  2 mg  Labs noted:  6.5% 09/23/2023, 6.6% 03/23/2023, EGFR 88 03/23/2023   Weight: 63 184 lbs 11/11/2023 188 lbs 06/30/2023 191 lbs 01/18/2022 187 lbs 08/02/2022 193 lbs 05/17/2022 183 lbs 11/16/2021 186 lbs 09/07/2021   182 lbs 06/16/2021 190 lbs 04/13/2021 (lost with uncontrolled glucose) 197 lbs 02/17/21 189 lbs 11/28/2020   24 hr Diet Hx:   Breakfast:  2 boiled eggs, oatmeal with butter Lunch:  Chicken pot pie, 2 oranges  Dinner:  2 sandwiches (ham and cheese), peas Beverages:  Water, occasional diet soda  Patient lives with her husband.  They share the shopping and cooking.  They used to eat out most of the time (fast food) but have changed to TV dinners most frequently since diabetes diagnosis. They rely on public transportation. She states that some vegetables (broccoli, cauliflower, and squash s) don't agree with her - make her want to vomit.  She enjoys cabbage and turnip greens.  Prefers canned food rather than frozen. She is on disability.  Walks with a cane or walker.   She smokes and has tried to quit without success. Food insecurity and relies on food banks to supplement.  Skips dinner a couple times a week due to money.  She was given the  shopping at the dollar tree handout on previous visit. Food stamps but not enough to last ($23 per month).  She does use a food pantry. Husband is trying to finalize retirement and reduce his work. Continues to walk her dog daily.  States that she is walking a lot.  Progress Towards Goal(s):  In progress.   Nutritional Diagnosis:  NB-1.1 Food and nutrition-related knowledge deficit As related to balance of carbohydrate, protein, and fat.  As evidenced by diet hx and patient report.    Intervention:  Continued to encourage patient with diabetes care. She is not currently getting food stamps but her sister is helping her with food. She continues to check her blood glucose multiple times per day. She continues to take her medication consistently. She no longer drinks sugary drinks.  Teaching Method Utilized:  Visual Auditory Hands on  Handouts given during visit include: None this visit  Barriers to learning/adherence to lifestyle change: finances  Demonstrated degree of understanding via:  Teach Back   Monitoring/Evaluation:  Dietary intake, exercise, and body weight in 6 month(s).

## 2023-11-12 DIAGNOSIS — E119 Type 2 diabetes mellitus without complications: Secondary | ICD-10-CM | POA: Diagnosis not present

## 2023-11-14 ENCOUNTER — Other Ambulatory Visit: Payer: Self-pay

## 2023-11-21 ENCOUNTER — Other Ambulatory Visit: Payer: Self-pay

## 2023-11-30 ENCOUNTER — Ambulatory Visit: Admitting: Podiatry

## 2023-11-30 ENCOUNTER — Encounter: Payer: Self-pay | Admitting: Podiatry

## 2023-11-30 DIAGNOSIS — B351 Tinea unguium: Secondary | ICD-10-CM

## 2023-11-30 DIAGNOSIS — E1142 Type 2 diabetes mellitus with diabetic polyneuropathy: Secondary | ICD-10-CM | POA: Diagnosis not present

## 2023-11-30 DIAGNOSIS — L84 Corns and callosities: Secondary | ICD-10-CM

## 2023-11-30 DIAGNOSIS — M79676 Pain in unspecified toe(s): Secondary | ICD-10-CM | POA: Diagnosis not present

## 2023-12-09 NOTE — Progress Notes (Signed)
  Subjective:  Patient ID: Christine Rocha, female    DOB: Aug 12, 1955,  MRN: [id number]  Ronal JAYSON Mt presents to clinic today for at risk foot care with history of diabetic neuropathy and callus(es) bilateral great toes and painful mycotic toenails that are difficult to trim. Painful toenails interfere with ambulation. Aggravating factors include wearing enclosed shoe gear. Pain is relieved with periodic professional debridement. Painful calluses are aggravated when weightbearing with and without shoegear. Pain is relieved with periodic professional debridement.  Chief Complaint  Patient presents with   Diabetes    DFC NIDDM A1C 6.5. Toenail trim and callus care.SABRA LOV with PCP 11/10/23.   New problem(s): None.   PCP is Theotis Haze ORN, NP.  Allergies  Allergen Reactions   Penicillins Nausea Only    Review of Systems: Negative except as noted in the HPI.  Objective: No changes noted in today's physical examination. There were no vitals filed for this visit. Christine Rocha is a pleasant 68 y.o. female in NAD. AAO x 3.  Vascular Examination: Vascular status intact b/l with palpable pedal pulses. CFT immediate b/l. Pedal hair present. No edema. No pain with calf compression b/l. Skin temperature gradient WNL b/l. No varicosities noted. No cyanosis or clubbing noted.  Neurological Examination: Pt has subjective symptoms of neuropathy. Protective sensation decreased with 10 gram monofilament b/l.  Dermatological Examination: Pedal skin with normal turgor, texture and tone b/l. No open wounds nor interdigital macerations noted. Toenails 1-5 b/l thick, discolored, elongated with subungual debris and pain on dorsal palpation.   Hyperkeratotic lesion(s) medial IPJ of left great toe and medial IPJ of right great toe.  No erythema, no edema, no drainage, no fluctuance.  Musculoskeletal Examination: Muscle strength 5/5 to b/l LE.  No pain, crepitus noted b/l. Pes planus b/l. Uses cane for  mobility.  Radiographs: None  Assessment/Plan: 1. Pain due to onychomycosis of toenail   2. Diabetic peripheral neuropathy associated with type 2 diabetes mellitus (HCC)   3. Callus   Patient was evaluated and treated. All patient's and/or POA's questions/concerns addressed on today's visit. Mycotic toenails 1-5 b/l debrided in length and girth without incident. Callus(es) medial IPJ of left great toe and medial IPJ of right great toe pared with sharp debridement without incident. Continue daily foot inspections and monitor blood glucose per PCP/Endocrinologist's recommendations. Continue soft, supportive shoe gear daily. Report any pedal injuries to medical professional. Call office if there are any questions/concerns. Return in about 3 months (around 03/01/2024).  Delon LITTIE Merlin, DPM      [facility] LOCATION: [address], [place] [address]                   Office [phone]   [facility] LOCATION: [address] [place], [place] [address] Office [phone]

## 2024-01-06 ENCOUNTER — Telehealth: Payer: Self-pay | Admitting: Nurse Practitioner

## 2024-01-06 NOTE — Telephone Encounter (Signed)
 Contacted patient; appointment confirmed for 12/29.

## 2024-01-09 ENCOUNTER — Ambulatory Visit: Attending: Nurse Practitioner | Admitting: Nurse Practitioner

## 2024-01-09 ENCOUNTER — Other Ambulatory Visit: Payer: Self-pay

## 2024-01-09 ENCOUNTER — Encounter: Payer: Self-pay | Admitting: Nurse Practitioner

## 2024-01-09 ENCOUNTER — Other Ambulatory Visit (HOSPITAL_COMMUNITY): Payer: Self-pay

## 2024-01-09 VITALS — BP 106/64 | HR 78 | Ht 63.0 in | Wt 184.0 lb

## 2024-01-09 DIAGNOSIS — E1165 Type 2 diabetes mellitus with hyperglycemia: Secondary | ICD-10-CM

## 2024-01-09 DIAGNOSIS — E782 Mixed hyperlipidemia: Secondary | ICD-10-CM | POA: Diagnosis not present

## 2024-01-09 DIAGNOSIS — E559 Vitamin D deficiency, unspecified: Secondary | ICD-10-CM | POA: Diagnosis not present

## 2024-01-09 DIAGNOSIS — F322 Major depressive disorder, single episode, severe without psychotic features: Secondary | ICD-10-CM | POA: Diagnosis not present

## 2024-01-09 DIAGNOSIS — I10 Essential (primary) hypertension: Secondary | ICD-10-CM

## 2024-01-09 DIAGNOSIS — Z7985 Long-term (current) use of injectable non-insulin antidiabetic drugs: Secondary | ICD-10-CM

## 2024-01-09 MED ORDER — AMLODIPINE BESYLATE 10 MG PO TABS
10.0000 mg | ORAL_TABLET | Freq: Every day | ORAL | 1 refills | Status: AC
  Filled 2024-01-09: qty 90, 90d supply, fill #0

## 2024-01-09 MED ORDER — ROSUVASTATIN CALCIUM 10 MG PO TABS
10.0000 mg | ORAL_TABLET | Freq: Every day | ORAL | 1 refills | Status: AC
  Filled 2024-01-09: qty 90, 90d supply, fill #0

## 2024-01-09 MED ORDER — LISINOPRIL 20 MG PO TABS
20.0000 mg | ORAL_TABLET | Freq: Every day | ORAL | 1 refills | Status: AC
  Filled 2024-01-09: qty 90, 90d supply, fill #0

## 2024-01-09 MED ORDER — SEMAGLUTIDE (2 MG/DOSE) 8 MG/3ML ~~LOC~~ SOPN
2.0000 mg | PEN_INJECTOR | SUBCUTANEOUS | 2 refills | Status: AC
  Filled 2024-01-09: qty 3, 28d supply, fill #0
  Filled 2024-02-01: qty 3, 28d supply, fill #1

## 2024-01-09 MED ORDER — ATENOLOL 50 MG PO TABS
50.0000 mg | ORAL_TABLET | Freq: Every day | ORAL | 1 refills | Status: AC
  Filled 2024-01-09: qty 90, 90d supply, fill #0

## 2024-01-09 NOTE — Progress Notes (Signed)
 "  Assessment & Plan:  [name] was seen today for hypertension.  Diagnoses and all orders for this visit:  Primary hypertension -     CMP14+EGFR -     amLODipine  (NORVASC ) 10 MG tablet; Take 1 tablet (10 mg total) by mouth daily. -     atenolol  (TENORMIN ) 50 MG tablet; Take 1 tablet (50 mg total) by mouth daily. -     lisinopril  (ZESTRIL ) 20 MG tablet; Take 1 tablet (20 mg total) by mouth daily.  Type 2 diabetes mellitus with hyperglycemia, without long-term current use of insulin  (HCC) -     Cancel: Hemoglobin A1c -     CMP14+EGFR -     Semaglutide , 2 MG/DOSE, 8 MG/3ML SOPN; Inject 2 mg as directed once a week.  Mixed hyperlipidemia -     rosuvastatin  (CRESTOR ) 10 MG tablet; Take 1 tablet (10 mg total) by mouth daily. To lower cholesterol  Vitamin D  deficiency disease -     VITAMIN D  25 Hydroxy (Vit-D Deficiency, Fractures)  Current severe episode of major depressive disorder without psychotic features, unspecified whether recurrent  Continue trazodone  Mood stable Many financial stressors. Husband retiring soon and bj's increasing.    Patient has been counseled on age-appropriate routine health concerns for screening and prevention. These are reviewed and up-to-date. Referrals have been placed accordingly. Immunizations are up-to-date or declined.    Subjective:   Chief Complaint  Patient presents with   Hypertension    Christine Rocha 68 y.o. female presents to office today for HTN   Patient has been counseled on age-appropriate routine health concerns for screening and prevention. These are reviewed and up-to-date. Referrals have been placed accordingly. Immunizations are up-to-date or declined.     MAMMOGRAM: Up-to-date Colon cancer screening: Up today Ophthalmology exam: Overdue.   Pap smear: N/A   She has a past medical history of Arthritis, DM2, Onychomycosis, Hypertension, and Seizures.   She had a teladoc appointment 12-01-2023 A1c 6.3  She also  had other biometric screenings performed. We will scan into her chart today.  HTN Blood pressure well controlled.  She takes amlodipine , lisinopril , and atenolol  daily as prescribed BP Readings from Last 3 Encounters:  01/09/24 106/64  09/23/23 129/69  06/01/23 124/73         Review of Systems  Constitutional:  Negative for fever, malaise/fatigue and weight loss.  HENT: Negative.  Negative for nosebleeds.   Eyes: Negative.  Negative for blurred vision, double vision and photophobia.  Respiratory: Negative.  Negative for cough and shortness of breath.   Cardiovascular: Negative.  Negative for chest pain, palpitations and leg swelling.  Gastrointestinal: Negative.  Negative for heartburn, nausea and vomiting.  Musculoskeletal: Negative.  Negative for myalgias.  Neurological: Negative.  Negative for dizziness, focal weakness, seizures and headaches.  Psychiatric/Behavioral: Negative.  Negative for suicidal ideas.     Past Medical History:  Diagnosis Date   Arthritis    Right Knee   Diabetes mellitus without complication (HCC)    Hypertension    Seizures (HCC)     Past Surgical History:  Procedure Laterality Date   COLONOSCOPY  2021   ORIF FEMUR FRACTURE Left 04/24/2017   Procedure: OPEN REDUCTION INTERNAL FIXATION (ORIF) DISTAL FEMUR FRACTURE;  Surgeon: Christine Jerona GAILS, MD;  Location: [facility] OR;  Service: Orthopedics;  Laterality: Left;    Family History  Problem Relation Age of Onset   Hypertension Mother    Breast cancer Mother    Hypertension Sister  Diabetes Sister     Social History Reviewed with no changes to be made today.   Outpatient Medications Prior to Visit  Medication Sig Dispense Refill   Blood Glucose Monitoring Suppl (CONTOUR NEXT EZ) w/Device KIT Use to check blood sugar once daily. 1 kit 0   glucose blood (CONTOUR NEXT TEST) test strip Use as directed to check blood sugar 3 times daily. 200 strip 3   hydrocortisone  cream 0.5 % Apply 1 Application  topically 2 (two) times daily. 56 g 0   Microlet Lancets MISC Use to check blood sugar once daily. 100 each 2   Multiple Vitamins-Minerals (WOMENS 50+ MULTI VITAMIN/MIN) TABS Take 1 tablet by mouth daily.     nystatin -triamcinolone  ointment (MYCOLOG) Apply 1 application. topically 2 (two) times daily. As needed under breasts and under stomach 100 g 0   senna-docusate (SENOKOT-S) 8.6-50 MG tablet Take 1-2 tablets by mouth daily. Stool softener 180 tablet 3   traZODone  (DESYREL ) 50 MG tablet Take 0.5-1 tablets (25-50 mg total) by mouth at bedtime as needed for sleep. 90 tablet 1   amLODipine  (NORVASC ) 10 MG tablet Take 1 tablet (10 mg total) by mouth daily. 90 tablet 1   atenolol  (TENORMIN ) 50 MG tablet Take 1 tablet (50 mg total) by mouth daily. 90 tablet 1   lisinopril  (ZESTRIL ) 20 MG tablet Take 1 tablet (20 mg total) by mouth daily. 90 tablet 1   rosuvastatin  (CRESTOR ) 10 MG tablet Take 1 tablet (10 mg total) by mouth daily. To lower cholesterol 90 tablet 1   Semaglutide , 2 MG/DOSE, 8 MG/3ML SOPN Inject 2 mg as directed once a week. 3 mL 2   No facility-administered medications prior to visit.    Allergies[1]     Objective:    BP 106/64 (BP Location: Left Arm, Patient Position: Sitting, Cuff Size: Normal)   Pulse 78   Ht 5' 3 (1.6 m)   Wt 184 lb (83.5 kg)   SpO2 98%   BMI 32.59 kg/m  Wt Readings from Last 3 Encounters:  01/09/24 184 lb (83.5 kg)  11/11/23 184 lb (83.5 kg)  09/23/23 186 lb (84.4 kg)    Physical Exam Vitals and nursing note reviewed.  Constitutional:      Appearance: She is well-developed.  HENT:     Head: Normocephalic and atraumatic.  Cardiovascular:     Rate and Rhythm: Normal rate and regular rhythm.     Heart sounds: Normal heart sounds. No murmur heard.    No friction rub. No gallop.  Pulmonary:     Effort: Pulmonary effort is normal. No tachypnea or respiratory distress.     Breath sounds: Normal breath sounds. No decreased breath sounds,  wheezing, rhonchi or rales.  Chest:     Chest wall: No tenderness.  Musculoskeletal:        General: Normal range of motion.     Cervical back: Normal range of motion.  Skin:    General: Skin is warm and dry.  Neurological:     Mental Status: She is alert and oriented to person, place, and time.     Coordination: Coordination normal.  Psychiatric:        Behavior: Behavior normal. Behavior is cooperative.        Thought Content: Thought content normal.        Judgment: Judgment normal.          Patient has been counseled extensively about nutrition and exercise as well as the importance of adherence with  medications and regular follow-up. The patient was given clear instructions to go to ER or return to medical center if symptoms don't improve, worsen or new problems develop. The patient verbalized understanding.   Follow-up: Return in about 4 months (around 05/09/2024).   Haze LELON Servant, FNP-BC [facility] [place], [place] [phone]   01/09/2024, 2:33 PM     [1]  Allergies Allergen Reactions   Penicillins Nausea Only   "

## 2024-01-10 ENCOUNTER — Other Ambulatory Visit: Payer: Self-pay

## 2024-01-10 ENCOUNTER — Ambulatory Visit: Payer: Self-pay | Admitting: Nurse Practitioner

## 2024-01-10 LAB — CMP14+EGFR
ALT: 14 IU/L (ref 0–32)
AST: 12 IU/L (ref 0–40)
Albumin: 4.7 g/dL (ref 3.9–4.9)
Alkaline Phosphatase: 62 IU/L (ref 49–135)
BUN/Creatinine Ratio: 15 (ref 12–28)
BUN: 13 mg/dL (ref 8–27)
Bilirubin Total: 0.3 mg/dL (ref 0.0–1.2)
CO2: 21 mmol/L (ref 20–29)
Calcium: 10 mg/dL (ref 8.7–10.3)
Chloride: 104 mmol/L (ref 96–106)
Creatinine, Ser: 0.87 mg/dL (ref 0.57–1.00)
Globulin, Total: 2.2 g/dL (ref 1.5–4.5)
Glucose: 84 mg/dL (ref 70–99)
Potassium: 4.2 mmol/L (ref 3.5–5.2)
Sodium: 142 mmol/L (ref 134–144)
Total Protein: 6.9 g/dL (ref 6.0–8.5)
eGFR: 73 mL/min/1.73

## 2024-01-10 LAB — VITAMIN D 25 HYDROXY (VIT D DEFICIENCY, FRACTURES): Vit D, 25-Hydroxy: 37.1 ng/mL (ref 30.0–100.0)

## 2024-02-01 ENCOUNTER — Other Ambulatory Visit: Payer: Self-pay

## 2024-03-14 ENCOUNTER — Ambulatory Visit: Admitting: Podiatry

## 2024-04-17 ENCOUNTER — Ambulatory Visit

## 2024-05-09 ENCOUNTER — Ambulatory Visit: Payer: Self-pay | Admitting: Nurse Practitioner

## 2024-05-11 ENCOUNTER — Encounter: Admitting: Dietician
